# Patient Record
Sex: Female | Born: 1940 | Race: White | Hispanic: No | Smoking: Never smoker
Health system: Southern US, Community
[De-identification: ages and names within clinical notes are randomized; demographics above are authoritative.]

## PROBLEM LIST (undated history)

## (undated) ENCOUNTER — Emergency Department (HOSPITAL_COMMUNITY): Admission: EM | Payer: Medicare HMO | Source: Home / Self Care

## (undated) DIAGNOSIS — R32 Unspecified urinary incontinence: Secondary | ICD-10-CM

## (undated) DIAGNOSIS — I480 Paroxysmal atrial fibrillation: Secondary | ICD-10-CM

## (undated) DIAGNOSIS — E7800 Pure hypercholesterolemia, unspecified: Secondary | ICD-10-CM

## (undated) DIAGNOSIS — M1990 Unspecified osteoarthritis, unspecified site: Secondary | ICD-10-CM

## (undated) DIAGNOSIS — F32A Depression, unspecified: Secondary | ICD-10-CM

## (undated) DIAGNOSIS — F0390 Unspecified dementia without behavioral disturbance: Secondary | ICD-10-CM

## (undated) DIAGNOSIS — F329 Major depressive disorder, single episode, unspecified: Secondary | ICD-10-CM

## (undated) DIAGNOSIS — I10 Essential (primary) hypertension: Secondary | ICD-10-CM

## (undated) DIAGNOSIS — K219 Gastro-esophageal reflux disease without esophagitis: Secondary | ICD-10-CM

## (undated) HISTORY — DX: Essential (primary) hypertension: I10

## (undated) HISTORY — DX: Unspecified osteoarthritis, unspecified site: M19.90

## (undated) HISTORY — PX: DILATION AND CURETTAGE OF UTERUS: SHX78

## (undated) HISTORY — DX: Paroxysmal atrial fibrillation: I48.0

## (undated) HISTORY — PX: VAGINAL HYSTERECTOMY: SUR661

## (undated) HISTORY — DX: Depression, unspecified: F32.A

## (undated) HISTORY — PX: APPENDECTOMY: SHX54

## (undated) HISTORY — DX: Major depressive disorder, single episode, unspecified: F32.9

---

## 1999-12-21 ENCOUNTER — Emergency Department (HOSPITAL_COMMUNITY): Admission: EM | Admit: 1999-12-21 | Discharge: 1999-12-21 | Payer: Self-pay | Admitting: Emergency Medicine

## 2001-08-04 ENCOUNTER — Emergency Department (HOSPITAL_COMMUNITY): Admission: EM | Admit: 2001-08-04 | Discharge: 2001-08-04 | Payer: Self-pay | Admitting: Emergency Medicine

## 2003-04-11 ENCOUNTER — Encounter: Admission: RE | Admit: 2003-04-11 | Discharge: 2003-04-11 | Payer: Self-pay | Admitting: Internal Medicine

## 2003-04-18 ENCOUNTER — Encounter: Admission: RE | Admit: 2003-04-18 | Discharge: 2003-04-18 | Payer: Self-pay | Admitting: Internal Medicine

## 2003-04-25 ENCOUNTER — Encounter: Admission: RE | Admit: 2003-04-25 | Discharge: 2003-04-25 | Payer: Self-pay | Admitting: Internal Medicine

## 2003-05-06 ENCOUNTER — Ambulatory Visit (HOSPITAL_COMMUNITY): Admission: RE | Admit: 2003-05-06 | Discharge: 2003-05-06 | Payer: Self-pay

## 2003-05-09 ENCOUNTER — Encounter: Admission: RE | Admit: 2003-05-09 | Discharge: 2003-05-09 | Payer: Self-pay | Admitting: Internal Medicine

## 2003-05-23 ENCOUNTER — Encounter: Admission: RE | Admit: 2003-05-23 | Discharge: 2003-05-23 | Payer: Self-pay | Admitting: Internal Medicine

## 2003-05-29 ENCOUNTER — Encounter: Admission: RE | Admit: 2003-05-29 | Discharge: 2003-05-29 | Payer: Self-pay | Admitting: Internal Medicine

## 2003-07-04 ENCOUNTER — Encounter: Admission: RE | Admit: 2003-07-04 | Discharge: 2003-07-04 | Payer: Self-pay | Admitting: Internal Medicine

## 2003-07-04 ENCOUNTER — Ambulatory Visit (HOSPITAL_COMMUNITY): Admission: RE | Admit: 2003-07-04 | Discharge: 2003-07-04 | Payer: Self-pay | Admitting: Internal Medicine

## 2003-08-07 ENCOUNTER — Encounter: Payer: Self-pay | Admitting: Cardiology

## 2003-08-07 ENCOUNTER — Ambulatory Visit (HOSPITAL_COMMUNITY): Admission: RE | Admit: 2003-08-07 | Discharge: 2003-08-07 | Payer: Self-pay | Admitting: Internal Medicine

## 2003-09-26 ENCOUNTER — Emergency Department (HOSPITAL_COMMUNITY): Admission: EM | Admit: 2003-09-26 | Discharge: 2003-09-27 | Payer: Self-pay | Admitting: *Deleted

## 2003-10-03 ENCOUNTER — Ambulatory Visit (HOSPITAL_COMMUNITY): Admission: RE | Admit: 2003-10-03 | Discharge: 2003-10-03 | Payer: Self-pay | Admitting: Internal Medicine

## 2003-10-03 ENCOUNTER — Encounter: Admission: RE | Admit: 2003-10-03 | Discharge: 2003-10-03 | Payer: Self-pay | Admitting: Internal Medicine

## 2003-10-10 ENCOUNTER — Encounter: Admission: RE | Admit: 2003-10-10 | Discharge: 2003-10-10 | Payer: Self-pay | Admitting: Internal Medicine

## 2003-12-31 ENCOUNTER — Encounter: Admission: RE | Admit: 2003-12-31 | Discharge: 2003-12-31 | Payer: Self-pay | Admitting: Internal Medicine

## 2004-07-22 ENCOUNTER — Ambulatory Visit: Payer: Self-pay | Admitting: Internal Medicine

## 2004-08-07 ENCOUNTER — Ambulatory Visit: Payer: Self-pay | Admitting: Internal Medicine

## 2005-02-15 ENCOUNTER — Ambulatory Visit: Payer: Self-pay | Admitting: Internal Medicine

## 2005-02-19 ENCOUNTER — Ambulatory Visit: Payer: Self-pay | Admitting: Internal Medicine

## 2005-03-02 ENCOUNTER — Ambulatory Visit: Payer: Self-pay | Admitting: Internal Medicine

## 2005-03-15 ENCOUNTER — Encounter: Admission: RE | Admit: 2005-03-15 | Discharge: 2005-03-15 | Payer: Self-pay | Admitting: Internal Medicine

## 2005-03-29 ENCOUNTER — Ambulatory Visit: Payer: Self-pay | Admitting: Internal Medicine

## 2005-03-31 ENCOUNTER — Ambulatory Visit: Payer: Self-pay | Admitting: Internal Medicine

## 2005-06-28 ENCOUNTER — Ambulatory Visit: Payer: Self-pay | Admitting: Internal Medicine

## 2005-12-23 ENCOUNTER — Ambulatory Visit: Payer: Self-pay | Admitting: Hospitalist

## 2005-12-23 ENCOUNTER — Ambulatory Visit (HOSPITAL_COMMUNITY): Admission: RE | Admit: 2005-12-23 | Discharge: 2005-12-23 | Payer: Self-pay | Admitting: Internal Medicine

## 2005-12-28 ENCOUNTER — Encounter (INDEPENDENT_AMBULATORY_CARE_PROVIDER_SITE_OTHER): Payer: Self-pay | Admitting: Cardiology

## 2005-12-28 ENCOUNTER — Ambulatory Visit (HOSPITAL_COMMUNITY): Admission: RE | Admit: 2005-12-28 | Discharge: 2005-12-28 | Payer: Self-pay | Admitting: Infectious Diseases

## 2006-02-23 ENCOUNTER — Ambulatory Visit: Payer: Self-pay | Admitting: Internal Medicine

## 2006-03-04 ENCOUNTER — Ambulatory Visit: Payer: Self-pay | Admitting: Internal Medicine

## 2006-03-04 ENCOUNTER — Encounter (INDEPENDENT_AMBULATORY_CARE_PROVIDER_SITE_OTHER): Payer: Self-pay | Admitting: Infectious Diseases

## 2006-03-04 LAB — CONVERTED CEMR LAB
BUN: 15 mg/dL (ref 6–23)
CO2: 25 meq/L (ref 19–32)
Calcium: 9.4 mg/dL (ref 8.4–10.5)
Chloride: 101 meq/L (ref 96–112)
Cholesterol: 109 mg/dL (ref 0–200)
Creatinine, Ser: 0.98 mg/dL (ref 0.40–1.20)
Glucose, Bld: 100 mg/dL — ABNORMAL HIGH (ref 70–99)
HDL: 44 mg/dL (ref >39)
LDL Cholesterol: 45 mg/dL (ref 0–99)
Potassium: 4.1 meq/L (ref 3.5–5.3)
Sodium: 141 meq/L (ref 135–145)
Total CHOL/HDL Ratio: 2.5
Triglycerides: 101 mg/dL (ref <150)
VLDL: 20 mg/dL (ref 0–40)

## 2006-04-12 ENCOUNTER — Encounter: Admission: RE | Admit: 2006-04-12 | Discharge: 2006-04-12 | Payer: Self-pay | Admitting: Internal Medicine

## 2006-07-13 ENCOUNTER — Ambulatory Visit: Payer: Self-pay | Admitting: Internal Medicine

## 2006-07-13 DIAGNOSIS — F329 Major depressive disorder, single episode, unspecified: Secondary | ICD-10-CM | POA: Insufficient documentation

## 2006-07-13 DIAGNOSIS — M25569 Pain in unspecified knee: Secondary | ICD-10-CM | POA: Insufficient documentation

## 2006-07-13 DIAGNOSIS — M545 Low back pain: Secondary | ICD-10-CM

## 2006-07-13 DIAGNOSIS — M1990 Unspecified osteoarthritis, unspecified site: Secondary | ICD-10-CM | POA: Insufficient documentation

## 2006-07-13 DIAGNOSIS — I10 Essential (primary) hypertension: Secondary | ICD-10-CM

## 2006-07-13 DIAGNOSIS — M810 Age-related osteoporosis without current pathological fracture: Secondary | ICD-10-CM

## 2006-08-04 ENCOUNTER — Telehealth: Payer: Self-pay | Admitting: *Deleted

## 2006-10-13 ENCOUNTER — Telehealth: Payer: Self-pay | Admitting: *Deleted

## 2006-10-25 ENCOUNTER — Telehealth: Payer: Self-pay | Admitting: *Deleted

## 2006-10-26 ENCOUNTER — Ambulatory Visit: Payer: Self-pay | Admitting: Internal Medicine

## 2006-10-26 DIAGNOSIS — L989 Disorder of the skin and subcutaneous tissue, unspecified: Secondary | ICD-10-CM | POA: Insufficient documentation

## 2006-11-14 ENCOUNTER — Telehealth (INDEPENDENT_AMBULATORY_CARE_PROVIDER_SITE_OTHER): Payer: Self-pay | Admitting: *Deleted

## 2006-11-23 ENCOUNTER — Telehealth (INDEPENDENT_AMBULATORY_CARE_PROVIDER_SITE_OTHER): Payer: Self-pay | Admitting: *Deleted

## 2007-01-05 ENCOUNTER — Inpatient Hospital Stay (HOSPITAL_COMMUNITY): Admission: EM | Admit: 2007-01-05 | Discharge: 2007-01-07 | Payer: Self-pay | Admitting: Emergency Medicine

## 2007-01-19 ENCOUNTER — Encounter (INDEPENDENT_AMBULATORY_CARE_PROVIDER_SITE_OTHER): Payer: Self-pay | Admitting: Infectious Diseases

## 2007-01-19 ENCOUNTER — Ambulatory Visit: Payer: Self-pay | Admitting: Hospitalist

## 2007-01-19 DIAGNOSIS — R7301 Impaired fasting glucose: Secondary | ICD-10-CM | POA: Insufficient documentation

## 2007-01-19 LAB — CONVERTED CEMR LAB
ALT: 13 units/L (ref 0–35)
AST: 18 units/L (ref 0–37)
Albumin: 4.1 g/dL (ref 3.5–5.2)
Alkaline Phosphatase: 92 units/L (ref 39–117)
BUN: 19 mg/dL (ref 6–23)
CO2: 24 meq/L (ref 19–32)
Calcium: 9.6 mg/dL (ref 8.4–10.5)
Chloride: 106 meq/L (ref 96–112)
Cholesterol: 127 mg/dL (ref 0–200)
Creatinine, Ser: 0.96 mg/dL (ref 0.40–1.20)
Glucose, Bld: 91 mg/dL (ref 70–99)
HDL: 43 mg/dL (ref >39)
Hgb A1c MFr Bld: 5.7 %
LDL Cholesterol: 60 mg/dL (ref 0–99)
Potassium: 4.8 meq/L (ref 3.5–5.3)
Sodium: 141 meq/L (ref 135–145)
Total Bilirubin: 0.7 mg/dL (ref 0.3–1.2)
Total CHOL/HDL Ratio: 3
Total Protein: 7.3 g/dL (ref 6.0–8.3)
Triglycerides: 119 mg/dL (ref <150)
VLDL: 24 mg/dL (ref 0–40)

## 2007-01-20 DIAGNOSIS — E669 Obesity, unspecified: Secondary | ICD-10-CM

## 2007-07-20 ENCOUNTER — Telehealth (INDEPENDENT_AMBULATORY_CARE_PROVIDER_SITE_OTHER): Payer: Self-pay | Admitting: Infectious Diseases

## 2007-08-22 ENCOUNTER — Telehealth (INDEPENDENT_AMBULATORY_CARE_PROVIDER_SITE_OTHER): Payer: Self-pay | Admitting: Infectious Diseases

## 2007-09-01 ENCOUNTER — Encounter (INDEPENDENT_AMBULATORY_CARE_PROVIDER_SITE_OTHER): Payer: Self-pay | Admitting: Infectious Diseases

## 2007-09-01 ENCOUNTER — Ambulatory Visit: Payer: Self-pay | Admitting: Hospitalist

## 2007-09-01 DIAGNOSIS — F418 Other specified anxiety disorders: Secondary | ICD-10-CM

## 2007-09-01 LAB — CONVERTED CEMR LAB
ALT: 13 units/L (ref 0–35)
AST: 18 units/L (ref 0–37)
Albumin: 4 g/dL (ref 3.5–5.2)
Alkaline Phosphatase: 93 units/L (ref 39–117)
BUN: 21 mg/dL (ref 6–23)
CO2: 25 meq/L (ref 19–32)
Calcium: 9.7 mg/dL (ref 8.4–10.5)
Chloride: 104 meq/L (ref 96–112)
Creatinine, Ser: 0.93 mg/dL (ref 0.40–1.20)
Glucose, Bld: 99 mg/dL (ref 70–99)
Potassium: 4.9 meq/L (ref 3.5–5.3)
Sodium: 142 meq/L (ref 135–145)
Total Bilirubin: 0.7 mg/dL (ref 0.3–1.2)
Total Protein: 7 g/dL (ref 6.0–8.3)

## 2007-12-07 ENCOUNTER — Emergency Department (HOSPITAL_COMMUNITY): Admission: EM | Admit: 2007-12-07 | Discharge: 2007-12-07 | Payer: Self-pay | Admitting: Emergency Medicine

## 2008-02-28 ENCOUNTER — Telehealth (INDEPENDENT_AMBULATORY_CARE_PROVIDER_SITE_OTHER): Payer: Self-pay | Admitting: *Deleted

## 2008-03-18 ENCOUNTER — Telehealth: Payer: Self-pay | Admitting: *Deleted

## 2008-04-04 ENCOUNTER — Ambulatory Visit: Payer: Self-pay | Admitting: Internal Medicine

## 2008-04-04 ENCOUNTER — Encounter (INDEPENDENT_AMBULATORY_CARE_PROVIDER_SITE_OTHER): Payer: Self-pay | Admitting: *Deleted

## 2008-04-15 ENCOUNTER — Encounter (INDEPENDENT_AMBULATORY_CARE_PROVIDER_SITE_OTHER): Payer: Self-pay | Admitting: Internal Medicine

## 2008-04-15 ENCOUNTER — Ambulatory Visit: Payer: Self-pay | Admitting: Internal Medicine

## 2008-04-15 DIAGNOSIS — K219 Gastro-esophageal reflux disease without esophagitis: Secondary | ICD-10-CM

## 2008-04-15 LAB — CONVERTED CEMR LAB
BUN: 15 mg/dL (ref 6–23)
CO2: 29 meq/L (ref 19–32)
Calcium: 9.9 mg/dL (ref 8.4–10.5)
Chloride: 100 meq/L (ref 96–112)
Creatinine, Ser: 0.96 mg/dL (ref 0.40–1.20)
Glucose, Bld: 103 mg/dL — ABNORMAL HIGH (ref 70–99)
Potassium: 4.7 meq/L (ref 3.5–5.3)
Sodium: 137 meq/L (ref 135–145)

## 2008-04-18 LAB — CONVERTED CEMR LAB
BUN: 13 mg/dL (ref 6–23)
Basophils Absolute: 0.1 10*3/uL (ref 0.0–0.1)
Basophils Relative: 1 % (ref 0–1)
CO2: 24 meq/L (ref 19–32)
Calcium: 10 mg/dL (ref 8.4–10.5)
Chloride: 104 meq/L (ref 96–112)
Cholesterol: 138 mg/dL (ref 0–200)
Creatinine, Ser: 1.08 mg/dL (ref 0.40–1.20)
Eosinophils Absolute: 0.2 10*3/uL (ref 0.0–0.7)
Eosinophils Relative: 2 % (ref 0–5)
Glucose, Bld: 92 mg/dL (ref 70–99)
HCT: 44.9 % (ref 36.0–46.0)
HDL: 48 mg/dL (ref >39)
Hemoglobin: 14.4 g/dL (ref 12.0–15.0)
LDL Cholesterol: 67 mg/dL (ref 0–99)
Lymphocytes Relative: 42 % (ref 12–46)
Lymphs Abs: 4.6 10*3/uL — ABNORMAL HIGH (ref 0.7–4.0)
MCHC: 32.1 g/dL (ref 30.0–36.0)
MCV: 92 fL (ref 78.0–100.0)
Monocytes Absolute: 1.3 10*3/uL — ABNORMAL HIGH (ref 0.1–1.0)
Monocytes Relative: 12 % (ref 3–12)
Neutro Abs: 4.9 10*3/uL (ref 1.7–7.7)
Neutrophils Relative %: 44 % (ref 43–77)
Platelets: 255 10*3/uL (ref 150–400)
Potassium: 5.6 meq/L — ABNORMAL HIGH (ref 3.5–5.3)
RBC: 4.88 M/uL (ref 3.87–5.11)
RDW: 14.2 % (ref 11.5–15.5)
Sodium: 140 meq/L (ref 135–145)
Total CHOL/HDL Ratio: 2.9
Triglycerides: 116 mg/dL (ref <150)
VLDL: 23 mg/dL (ref 0–40)
WBC: 11 10*3/uL — ABNORMAL HIGH (ref 4.0–10.5)

## 2008-04-24 ENCOUNTER — Telehealth (INDEPENDENT_AMBULATORY_CARE_PROVIDER_SITE_OTHER): Payer: Self-pay | Admitting: Internal Medicine

## 2008-06-05 ENCOUNTER — Telehealth (INDEPENDENT_AMBULATORY_CARE_PROVIDER_SITE_OTHER): Payer: Self-pay | Admitting: *Deleted

## 2008-06-12 ENCOUNTER — Encounter (INDEPENDENT_AMBULATORY_CARE_PROVIDER_SITE_OTHER): Payer: Self-pay | Admitting: *Deleted

## 2008-06-12 ENCOUNTER — Ambulatory Visit: Payer: Self-pay | Admitting: Internal Medicine

## 2008-06-13 ENCOUNTER — Encounter (INDEPENDENT_AMBULATORY_CARE_PROVIDER_SITE_OTHER): Payer: Self-pay | Admitting: Internal Medicine

## 2008-06-13 LAB — CONVERTED CEMR LAB
BUN: 12 mg/dL (ref 6–23)
CO2: 21 meq/L (ref 19–32)
Calcium: 10.1 mg/dL (ref 8.4–10.5)
Creatinine, Ser: 1.02 mg/dL (ref 0.40–1.20)
Glucose, Bld: 121 mg/dL — ABNORMAL HIGH (ref 70–99)
Potassium: 5.1 meq/L (ref 3.5–5.3)

## 2008-10-01 ENCOUNTER — Ambulatory Visit: Payer: Self-pay | Admitting: Internal Medicine

## 2008-10-01 DIAGNOSIS — L259 Unspecified contact dermatitis, unspecified cause: Secondary | ICD-10-CM

## 2008-12-12 ENCOUNTER — Telehealth: Payer: Self-pay | Admitting: *Deleted

## 2008-12-30 ENCOUNTER — Telehealth (INDEPENDENT_AMBULATORY_CARE_PROVIDER_SITE_OTHER): Payer: Self-pay | Admitting: Internal Medicine

## 2009-01-29 ENCOUNTER — Encounter: Payer: Self-pay | Admitting: Internal Medicine

## 2009-01-29 ENCOUNTER — Ambulatory Visit (HOSPITAL_COMMUNITY): Admission: RE | Admit: 2009-01-29 | Discharge: 2009-01-29 | Payer: Self-pay | Admitting: Internal Medicine

## 2009-01-29 ENCOUNTER — Ambulatory Visit: Payer: Self-pay | Admitting: Infectious Diseases

## 2009-01-29 DIAGNOSIS — E785 Hyperlipidemia, unspecified: Secondary | ICD-10-CM | POA: Insufficient documentation

## 2009-01-30 LAB — CONVERTED CEMR LAB
ALT: 15 units/L (ref 0–35)
AST: 19 units/L (ref 0–37)
Albumin: 4.2 g/dL (ref 3.5–5.2)
Alkaline Phosphatase: 84 units/L (ref 39–117)
BUN: 19 mg/dL (ref 6–23)
CO2: 22 meq/L (ref 19–32)
Calcium: 9.8 mg/dL (ref 8.4–10.5)
Chloride: 104 meq/L (ref 96–112)
Cholesterol: 180 mg/dL (ref 0–200)
Creatinine, Ser: 1 mg/dL (ref 0.40–1.20)
Glucose, Bld: 88 mg/dL (ref 70–99)
HDL: 46 mg/dL (ref >39)
LDL Cholesterol: 107 mg/dL — ABNORMAL HIGH (ref 0–99)
Potassium: 5.1 meq/L (ref 3.5–5.3)
Sodium: 139 meq/L (ref 135–145)
TSH: 5.019 microintl units/mL — ABNORMAL HIGH (ref 0.350–4.5)
Total Bilirubin: 0.7 mg/dL (ref 0.3–1.2)
Total CHOL/HDL Ratio: 3.9
Total Protein: 7.1 g/dL (ref 6.0–8.3)
Triglycerides: 134 mg/dL (ref <150)
VLDL: 27 mg/dL (ref 0–40)

## 2009-02-10 ENCOUNTER — Ambulatory Visit (HOSPITAL_COMMUNITY): Admission: RE | Admit: 2009-02-10 | Discharge: 2009-02-10 | Payer: Self-pay | Admitting: Internal Medicine

## 2009-02-11 ENCOUNTER — Encounter: Payer: Self-pay | Admitting: Internal Medicine

## 2009-02-25 ENCOUNTER — Ambulatory Visit: Payer: Self-pay | Admitting: Internal Medicine

## 2009-02-25 DIAGNOSIS — E039 Hypothyroidism, unspecified: Secondary | ICD-10-CM | POA: Insufficient documentation

## 2009-02-25 LAB — CONVERTED CEMR LAB
Free T4: 1.01 ng/dL (ref 0.80–1.80)
T3, Free: 2.9 pg/mL (ref 2.3–4.2)
TSH: 4.56 microintl units/mL — ABNORMAL HIGH (ref 0.350–4.5)
Vitamin B-12: 447 pg/mL (ref 211–911)

## 2009-03-11 ENCOUNTER — Encounter: Payer: Self-pay | Admitting: Internal Medicine

## 2009-03-12 ENCOUNTER — Encounter: Payer: Self-pay | Admitting: Internal Medicine

## 2009-05-23 ENCOUNTER — Ambulatory Visit: Payer: Self-pay | Admitting: Internal Medicine

## 2009-05-23 LAB — CONVERTED CEMR LAB
Alkaline Phosphatase: 77 units/L (ref 39–117)
Creatinine, Ser: 0.86 mg/dL (ref 0.40–1.20)
Glucose, Bld: 92 mg/dL (ref 70–99)
HCT: 44.7 % (ref 36.0–46.0)
HDL: 49 mg/dL (ref >39)
LDL Cholesterol: 90 mg/dL (ref 0–99)
MCHC: 31.3 g/dL (ref 30.0–36.0)
MCV: 92.5 fL (ref >=78.0)
RBC: 4.83 M/uL (ref 3.87–5.11)
Sodium: 138 meq/L (ref 135–145)
Total Bilirubin: 0.8 mg/dL (ref 0.3–1.2)
Total CHOL/HDL Ratio: 3.2
Total Protein: 7.4 g/dL (ref 6.0–8.3)
Triglycerides: 83 mg/dL (ref <150)
VLDL: 17 mg/dL (ref 0–40)

## 2009-06-26 ENCOUNTER — Encounter: Payer: Self-pay | Admitting: Internal Medicine

## 2009-07-08 ENCOUNTER — Telehealth: Payer: Self-pay | Admitting: Internal Medicine

## 2009-07-15 ENCOUNTER — Ambulatory Visit: Payer: Self-pay | Admitting: Internal Medicine

## 2009-09-11 ENCOUNTER — Ambulatory Visit: Payer: Self-pay | Admitting: Internal Medicine

## 2010-01-02 ENCOUNTER — Ambulatory Visit: Payer: Self-pay | Admitting: Internal Medicine

## 2010-01-02 LAB — CONVERTED CEMR LAB
Albumin: 4 g/dL (ref 3.5–5.2)
BUN: 15 mg/dL (ref 6–23)
CO2: 26 meq/L (ref 19–32)
Calcium: 9.3 mg/dL (ref 8.4–10.5)
Chloride: 101 meq/L (ref 96–112)
Cholesterol: 116 mg/dL (ref 0–200)
Creatinine, Ser: 0.91 mg/dL (ref 0.40–1.20)
Glucose, Bld: 85 mg/dL (ref 70–99)
HDL: 51 mg/dL (ref >39)
LDL Goal: 130 mg/dL
Potassium: 4.6 meq/L (ref 3.5–5.3)
Total CHOL/HDL Ratio: 2.3
Triglycerides: 64 mg/dL (ref <150)

## 2010-03-23 ENCOUNTER — Telehealth: Payer: Self-pay | Admitting: *Deleted

## 2010-03-23 ENCOUNTER — Encounter: Payer: Self-pay | Admitting: Internal Medicine

## 2010-03-24 ENCOUNTER — Encounter: Payer: Self-pay | Admitting: Internal Medicine

## 2010-03-24 ENCOUNTER — Ambulatory Visit: Payer: Self-pay | Admitting: Cardiology

## 2010-03-26 ENCOUNTER — Encounter: Payer: Self-pay | Admitting: Internal Medicine

## 2010-03-26 DIAGNOSIS — J168 Pneumonia due to other specified infectious organisms: Secondary | ICD-10-CM

## 2010-04-03 ENCOUNTER — Ambulatory Visit: Payer: Self-pay | Admitting: Internal Medicine

## 2010-04-06 ENCOUNTER — Telehealth: Payer: Self-pay | Admitting: Internal Medicine

## 2010-04-09 ENCOUNTER — Encounter: Payer: Self-pay | Admitting: Internal Medicine

## 2010-04-09 ENCOUNTER — Inpatient Hospital Stay (HOSPITAL_COMMUNITY): Admission: EM | Admit: 2010-04-09 | Discharge: 2010-03-27 | Payer: Self-pay | Admitting: Emergency Medicine

## 2010-04-11 ENCOUNTER — Encounter: Payer: Self-pay | Admitting: Internal Medicine

## 2010-04-15 ENCOUNTER — Telehealth: Payer: Self-pay | Admitting: Internal Medicine

## 2010-05-05 ENCOUNTER — Encounter: Payer: Self-pay | Admitting: Internal Medicine

## 2010-05-07 ENCOUNTER — Encounter: Payer: Self-pay | Admitting: Internal Medicine

## 2010-05-07 ENCOUNTER — Encounter: Payer: Self-pay | Admitting: Infectious Diseases

## 2010-06-02 NOTE — Progress Notes (Signed)
Summary: phone/gg  Phone Note Call from Patient   Caller: Patient Summary of Call: Pt called with c/o diarrhea, sore all over, headache, fever on and off, productive cough and SOB. Onset 7 days ago.  She is taking IBU with some relief.  Offered appointment at 4:00 but she can't get a ride at that time. She will go to [facility] this AM Initial call taken by: Merrie Roof RN,  March 23, 2010 10:38 AM

## 2010-06-02 NOTE — Assessment & Plan Note (Signed)
Summary: KNEE/ (MILLS)SB.   Vital Signs:  Patient profile:   70 year old female Height:      59 inches (149.86 cm) Weight:      256.03 pounds (116.38 kg) BMI:     51.90 O2 Sat:      99 % on Room air Temp:     98.4 degrees F (36.89 degrees C) oral Pulse rate:   96 / minute BP sitting:   156 / 93  (left arm) Cuff size:   small  Vitals Entered By: Angelina Ok RN (May 23, 2009 10:08 AM)  O2 Flow:  Room air CC: Depression Is Patient Diabetic? No Pain Assessment Patient in pain? yes     Location: legs Intensity: 8 Type: aching Onset of pain  Constant Nutritional Status BMI of > 30 = obese  Have you ever been in a relationship where you felt threatened, hurt or afraid?No   Does patient need assistance? Functional Status Self care Ambulation Impaired:Risk for fall, Wheelchair Comments Needs refillon Paxil has been out about 5 days.  Needs refills on other meds.  Has a friend who is dying.  Teary .  Using a wheel chair today at visit.  Usually uses a walker.     Primary Care Provider:  Ronda Fairly MD  CC:  Depression.  History of Present Illness: 70 yr old woman with pmhx as described below comes to the clinic for follow up. Patient would like to know why see was started on Levothyroxine. Reports to be feeling depressed as her friend is dying from metastatic breast cancer. She ran out of Paxil 5 days ago. Patient denies HI/SI.   Patient states that recently her leg has not been bothering her unless she is on her feet a long time.   Patient is being seen by [employer]. Reports that specialist told her she will need knee surgery, but does not know when they are planning to do it.   Depression History:      The patient is having a depressed mood most of the day but denies diminished interest in her usual daily activities.        The patient denies that she feels like life is not worth living, denies that she wishes that she were dead, and denies that she has  thought about ending her life.        Comments:  Has been off her Paxil x 5 days.  Has a close friend who is dying.  Tearful.   Problems Prior to Update: 1)  Hypothyroidism  (ICD-244.9) 2)  Hyperlipidemia  (ICD-272.4) 3)  Contact Dermatitis&other Eczema Due Unspec Cause  (ICD-692.9) 4)  Accidental Fall  (ICD-E888.9) 5)  Gerd  (ICD-530.81) 6)  Anxiety Disorder Conditions Classified Elsewhere  (ICD-293.84) 7)  Health Maintenance Exam  (ICD-V70.0) 8)  Obesity  (ICD-278.00) 9)  Impaired Fasting Glucose  (ICD-790.21) 10)  Skin Lesion  (ICD-709.9) 11)  Family History Depression  (ICD-V17.0) 12)  Osteoporosis  (ICD-733.00) 13)  Osteoarthritis  (ICD-715.90) 14)  Low Back Pain  (ICD-724.2) 15)  Hypertension  (ICD-401.9) 16)  Depression  (ICD-311) 17)  Knee Pain, Bilateral  (ICD-719.46)  Medications Prior to Update: 1)  Paxil 40 Mg Tabs (Paroxetine Hcl) .... Take 1 Tablet By Mouth Once A Day 2)  Ultram 50 Mg Tabs (Tramadol Hcl) .... Take 1 Tablet By Mouth Two Times A Day As Needed 3)  Simvastatin 10 Mg Tabs (Simvastatin) .... Take 1 Tablet By Mouth Once A Day 4)  Calcarb 600/d 600-125 Mg-Unit Tabs (Calcium-Vitamin D) .... Take 1 Tablet By Mouth Three Times A Day 5)  Prilosec 20 Mg Cpdr (Omeprazole) .... Take 2 Tablet By Mouth One Time A Day 6)  Triamcinolone Acetonide 0.1 % Crea (Triamcinolone Acetonide) .... Apply To Irritated Skin Three Times Daily.  Please Do Not Put On Open Cuts or Your Face. 7)  Lisinopril 5 Mg Tabs (Lisinopril) .... Take One Tab By Mouth Once Daily 8)  Synthroid 25 Mcg Tabs (Levothyroxine Sodium) .... Take 1 Tablet By Mouth Daily.  Current Medications (verified): 1)  Paxil 40 Mg Tabs (Paroxetine Hcl) .... Take 1 Tablet By Mouth Once A Day 2)  Ultram 50 Mg Tabs (Tramadol Hcl) .... Take 1 Tablet By Mouth Two Times A Day As Needed 3)  Simvastatin 10 Mg Tabs (Simvastatin) .... Take 1 Tablet By Mouth Once A Day 4)  Calcarb 600/d 600-125 Mg-Unit Tabs (Calcium-Vitamin D)  .... Take 1 Tablet By Mouth Three Times A Day 5)  Prilosec 20 Mg Cpdr (Omeprazole) .... Take 2 Tablet By Mouth One Time A Day 6)  Triamcinolone Acetonide 0.1 % Crea (Triamcinolone Acetonide) .... Apply To Irritated Skin Three Times Daily.  Please Do Not Put On Open Cuts or Your Face. 7)  Lisinopril 5 Mg Tabs (Lisinopril) .... Take One Tab By Mouth Once Daily 8)  Synthroid 25 Mcg Tabs (Levothyroxine Sodium) .... Take 1 Tablet By Mouth Daily.  Allergies: No Known Drug Allergies  Past History:  Past Medical History: Last updated: 07/13/2006 Bilateral knee pain Depression Hypertension Low back pain Osteoarthritis Osteoporosis  Family History: Last updated: 04/15/2008 Family History Depression  Social History: Last updated: 04/15/2008 Retired: motel work  Divorced Never Smoked Alcohol use-no Drug use-no Regular exercise-no  Risk Factors: Exercise: no (02/25/2009)  Risk Factors: Smoking Status: never (02/25/2009)  Family History: Reviewed history from 04/15/2008 and no changes required. Family History Depression  Social History: Reviewed history from 04/15/2008 and no changes required. Retired: motel work  Divorced Never Smoked Alcohol use-no Drug use-no Regular exercise-no  Review of Systems       The patient complains of difficulty walking.  The patient denies fever, chest pain, dyspnea on exertion, prolonged cough, headaches, hemoptysis, abdominal pain, melena, hematochezia, and hematuria.    Physical Exam  General:  alert.  teary Mouth:  MMM Neck:  supple.   Lungs:  normal breath sounds, no crackles, and no wheezes.   Heart:  normal rate, regular rhythm, no murmur, and no gallop.   Abdomen:  obese, soft and non-tender.  normal bowel sounds.   Msk:  Tenderness on the left knee. no joint warmth, no redness over joints, no joint deformities, and no joint instability.   Extremities:  trace left pedal edema and +1 right pedal edema.   Neurologic:  alert &  oriented X3.  Skin:  skin pigmentation changes on lower extremities erythema on digits and dorsum of hands bilat, erythema spares the palms.  There is also small breaks/cracks in the skin at joint surfaces.  No active bleeding or drainage. Psych:  depressed affect.     Impression & Recommendations:  Problem # 1:  HYPERTENSION (ICD-401.9) Uncontrolled. No episodes of falling. Will slightly increase medication  and follow up. Check bmet in one month.  Her updated medication list for this problem includes:    Lisinopril 10 Mg Tabs (Lisinopril) .Marland Kitchen... Take 1 tablet by mouth once a day  BP today: 156/93 Prior BP: 153/90 (02/25/2009)  Labs Reviewed: K+: 5.1 (01/29/2009) Creat: :  1.00 (01/29/2009)   Chol: 180 (01/29/2009)   HDL: 46 (01/29/2009)   LDL: 107 (01/29/2009)   TG: 134 (01/29/2009)  Problem # 2:  DEPRESSION (ICD-311) Depression not controlled on current regimen. No SSI/HI. Will dc paxil. Start patient on celexa. On follow up will consider increasing celexa if needed. Can also combine with wellbutrin if needed.  Her updated medication list for this problem includes:    Celexa 20 Mg Tabs (Citalopram hydrobromide) .Marland Kitchen... Take 1 tablet by mouth once a day  Problem # 3:  HYPOTHYROIDISM (ICD-244.9) Recheck TSH and reasses.  Her updated medication list for this problem includes:    Synthroid 25 Mcg Tabs (Levothyroxine sodium) .Marland Kitchen... Take 1 tablet by mouth daily.  Orders: T-TSH [id number])  Labs Reviewed: TSH: 4.560 (02/25/2009)    HgBA1c: 5.7 (01/19/2007) Chol: 180 (01/29/2009)   HDL: 46 (01/29/2009)   LDL: 107 (01/29/2009)   TG: 134 (01/29/2009)  Problem # 4:  HYPERLIPIDEMIA (ICD-272.4) Recheck lab results and reasses.  Her updated medication list for this problem includes:    Simvastatin 10 Mg Tabs (Simvastatin) .Marland Kitchen... Take 1 tablet by mouth once a day  Orders: T-Comprehensive Metabolic Panel [id number]) T-Lipid Profile [id number]) T-CBC No Diff ([id number])  Labs  Reviewed: SGOT: 19 (01/29/2009)   SGPT: 15 (01/29/2009)   HDL:46 (01/29/2009), 48 (04/04/2008)  LDL:107 (01/29/2009), 67 (44/05/270)  Chol:180 (01/29/2009), 138 (04/04/2008)  Trig:134 (01/29/2009), 116 (04/04/2008)  Problem # 5:  CONTACT DERMATITIS&OTHER ECZEMA DUE UNSPEC CAUSE (ICD-692.9) Continue current regimen.  Her updated medication list for this problem includes:    Triamcinolone Acetonide 0.1 % Crea (Triamcinolone acetonide) .Marland Kitchen... Apply to irritated skin three times daily.  please do not put on open cuts or your face.  Problem # 6:  KNEE PAIN, BILATERAL (ICD-719.46) Stable. Will get patient a 4 prong cane.  Her updated medication list for this problem includes:    Ultram 50 Mg Tabs (Tramadol hcl) .Marland Kitchen... Take 1 tablet by mouth two times a day as needed  Complete Medication List: 1)  Celexa 20 Mg Tabs (Citalopram hydrobromide) .... Take 1 tablet by mouth once a day 2)  Ultram 50 Mg Tabs (Tramadol hcl) .... Take 1 tablet by mouth two times a day as needed 3)  Simvastatin 10 Mg Tabs (Simvastatin) .... Take 1 tablet by mouth once a day 4)  Calcarb 600/d 600-125 Mg-unit Tabs (Calcium-vitamin d) .... Take 1 tablet by mouth three times a day 5)  Prilosec 20 Mg Cpdr (Omeprazole) .... Take 2 tablet by mouth one time a day 6)  Triamcinolone Acetonide 0.1 % Crea (Triamcinolone acetonide) .... Apply to irritated skin three times daily.  please do not put on open cuts or your face. 7)  Lisinopril 10 Mg Tabs (Lisinopril) .... Take 1 tablet by mouth once a day 8)  Synthroid 25 Mcg Tabs (Levothyroxine sodium) .... Take 1 tablet by mouth daily.  Patient Instructions: 1)  Please schedule a follow-up appointment in 1 month recheck blood pressure and depressive symptoms. 2)  Remember to start taking Lisinopril 10mg  by mouth daily. 3)  Stop taking Paxil. 4)  Start taking Celexa 20mg  by mouth daily.  5)  Take all medication as prescribed. 6)  If you develop chest pain that is associated with feeling  your heart racing, profuse sweating that last for more than 30 minutes please go to the Emergency Room. 7)  You will be called with any abnormalities in the tests scheduled or performed today.  If you don't hear from [place] within  a week from when the test was performed, you can assume that your test was normal.  Prescriptions: LISINOPRIL 10 MG TABS (LISINOPRIL) Take 1 tablet by mouth once a day  #30 x 1   Entered and Authorized by:   Laren Everts MD   Signed by:   Laren Everts MD on 05/23/2009   Method used:   Electronically to        [facility] [address] [phone]* (retail)       [address]       [address]       [place], [place]  [address]       Ph: [phone]       Fax: [phone]   RxID:   [id number] TRIAMCINOLONE ACETONIDE 0.1 % CREA (TRIAMCINOLONE ACETONIDE) Apply to irritated skin three times daily.  Please do not put on open cuts or your face.  #1 tube x 1   Entered and Authorized by:   Laren Everts MD   Signed by:   Laren Everts MD on 05/23/2009   Method used:   Electronically to        [facility] [address] [phone]* (retail)       [address]       [address]       [place], [place]  [address]       Ph: [phone]       Fax: [phone]   RxID:   [id number] CELEXA 20 MG TABS (CITALOPRAM HYDROBROMIDE) Take 1 tablet by mouth once a day  #30 x 1   Entered and Authorized by:   Laren Everts MD   Signed by:   Laren Everts MD on 05/23/2009   Method used:   Electronically to        [facility] [address] [phone]* (retail)       [address]       [address]       [place], [place]  [address]       Ph: [phone]       Fax: [phone]   RxID:   [id number]   Prevention & Chronic Care Immunizations   Influenza vaccine: Fluvax 3+  (01/29/2009)    Tetanus booster: Not documented    Pneumococcal vaccine: Not documented    H. zoster vaccine: Not documented  Colorectal Screening    Hemoccult: Not documented    Colonoscopy: Abnormal  (09/24/2005)  Other Screening   Pap smear: Not documented    Mammogram: Not documented    DXA bone density scan: Not documented   Smoking status: never  (02/25/2009)  Lipids   Total Cholesterol: 180  (01/29/2009)   Lipid panel action/deferral: Lipid Panel ordered   LDL: 107  (01/29/2009)   LDL Direct: Not documented   HDL: 46  (01/29/2009)   Triglycerides: 134  (01/29/2009)    SGOT (AST): 19  (01/29/2009)   SGPT (ALT): 15  (01/29/2009) CMP ordered    Alkaline phosphatase: 84  (01/29/2009)   Total bilirubin: 0.7  (01/29/2009)    Lipid flowsheet reviewed?: Yes   Progress toward LDL goal: At goal  Hypertension   Last Blood Pressure: 156 / 93  (05/23/2009)   Serum creatinine: 1.00  (01/29/2009)   Serum potassium 5.1  (01/29/2009) CMP ordered     Hypertension flowsheet reviewed?: Yes   Progress toward BP goal: Unchanged  Self-Management Support :   Personal Goals (by the next clinic visit) :      Personal blood pressure goal:  150/100  (02/25/2009)     Personal LDL goal: 130  (02/25/2009)    Patient will work on the following items until the next clinic visit to reach self-care goals:     Medications and monitoring: take my medicines every day, bring all of my medications to every visit  (05/23/2009)     Eating: drink diet soda or water instead of juice or soda, eat more vegetables, eat foods that are low in salt, eat baked foods instead of fried foods  (05/23/2009)     Activity: take a 30 minute walk every day  (05/23/2009)     Other: Walking around in her house.  (05/23/2009)    Hypertension self-management support: Written self-care plan  (05/23/2009)   Hypertension self-care plan printed.    Lipid self-management support: Written self-care plan  (05/23/2009)   Lipid self-care plan printed.  Process Orders Check Orders Results:     [facility]: Check successful Tests Sent for requisitioning  (May 27, 2009 2:31 PM):     05/23/2009: [facility] -- T-TSH [id number] (signed)     05/23/2009: [facility] -- T-Comprehensive Metabolic Panel [[id number]] (signed)     05/23/2009: [facility] -- T-Lipid Profile [phone] (signed)     05/23/2009: [facility] -- T-CBC No Diff [[id number]] (signed)    Process Orders Check Orders Results:     [facility]: Check successful Tests Sent for requisitioning (May 27, 2009 2:31 PM):     05/23/2009: [facility] -- T-TSH [phone] (signed)     05/23/2009: [facility] -- T-Comprehensive Metabolic Panel [[id number]] (signed)     05/23/2009: [facility] -- T-Lipid Profile [phone] (signed)     05/23/2009: [facility] -- T-CBC No Diff [[id number]] (signed)

## 2010-06-02 NOTE — Assessment & Plan Note (Signed)
Summary: flu sx/gg  see last note   Vital Signs:  Patient profile:   70 year old female Height:      59 inches (149.86 cm) Weight:      254.0 pounds (116.38 kg) BMI:     51.49 Temp:     98.7 degrees F (37.06 degrees C) oral Pulse rate:   76 / minute BP sitting:   130 / 88  (left arm) Cuff size:   RIGHT  Vitals Entered By: Theotis Barrio NT II (July 15, 2009 11:11 AM) CC: MEDICATION REFILL /  ROUTINE OFFICE VISIT, Depression Is Patient Diabetic? No Pain Assessment Patient in pain? no      Nutritional Status BMI of > 30 = obese  Have you ever been in a relationship where you felt threatened, hurt or afraid?No   Does patient need assistance? Functional Status Self care Comments MEDICATION REFILL  / ROUTINE OFFICE VISIT   Primary Care Provider:  Nelda Bucks DO  CC:  MEDICATION REFILL /  ROUTINE OFFICE VISIT and Depression.  History of Present Illness: Lisa Haynes is a 70 year old Female with PMH/problems as outlined in the EMR, who presents to the [facility] for routine follow up. She doesn't have any new complaints, wants refills. She is confused about what medicine is for what and wants them explained. Recent phone call about cough, she says it is resolved now.   Depression History:      The patient denies a depressed mood most of the day but notes a diminished interest in her usual daily activities.        Suicide risk questions reveal that she does not feel like life is worth living.  The patient denies that she wishes that she were dead and denies that she has thought about ending her life.        Comments:  AT TIMES YES AND NO.   Preventive Screening-Counseling & Management  Alcohol-Tobacco     Smoking Status: never  Caffeine-Diet-Exercise     Does Patient Exercise: no  Current Medications (verified): 1)  Celexa 20 Mg Tabs (Citalopram Hydrobromide) .... Take 1 Tablet By Mouth Once A Day 2)  Ultram 50 Mg Tabs (Tramadol Hcl) .... Take 1 Tablet By Mouth Two Times A  Day As Needed 3)  Simvastatin 10 Mg Tabs (Simvastatin) .... Take 1 Tablet By Mouth Once A Day 4)  Calcarb 600/d 600-125 Mg-Unit Tabs (Calcium-Vitamin D) .... Take 1 Tablet By Mouth Three Times A Day 5)  Prilosec 20 Mg Cpdr (Omeprazole) .... Take 2 Tablet By Mouth One Time A Day 6)  Triamcinolone Acetonide 0.1 % Crea (Triamcinolone Acetonide) .... Apply To Irritated Skin Three Times Daily.  Please Do Not Put On Open Cuts or Your Face. 7)  Lisinopril 10 Mg Tabs (Lisinopril) .... Take 1 Tablet By Mouth Once A Day 8)  Synthroid 25 Mcg Tabs (Levothyroxine Sodium) .... Take 1 Tablet By Mouth Daily.  Allergies (verified): No Known Drug Allergies  Past History:  Past Medical History: Last updated: 07/13/2006 Bilateral knee pain Depression Hypertension Low back pain Osteoarthritis Osteoporosis  Family History: Last updated: 04/15/2008 Family History Depression  Social History: Last updated: 04/15/2008 Retired: motel work  Divorced Never Smoked Alcohol use-no Drug use-no Regular exercise-no  Risk Factors: Exercise: no (07/15/2009)  Risk Factors: Smoking Status: never (07/15/2009)  Review of Systems       Review of System: Negative except per HPI.    Physical Exam  General:  alert and overweight-appearing.  Lungs:  normal respiratory effort and normal breath sounds.   Heart:  normal rate and regular rhythm.   Abdomen:  soft and non-tender.   Pulses:  normal peripheral pulses  Extremities:  no cyanosis, clubbing or edema  Neurologic:  non focal.  Psych:  normally interactive.     Impression & Recommendations:  Problem # 1:  HYPOTHYROIDISM (ICD-244.9) Out of meds for a month now. Restart today and recheck TSH in about a months time.  Her updated medication list for this problem includes:    Synthroid 25 Mcg Tabs (Levothyroxine sodium) .Marland Kitchen... Take 1 tablet by mouth daily.  Problem # 2:  HYPERLIPIDEMIA (ICD-272.4) Continue simvastatin.  Chol: 156 (05/23/2009  7:17:00 PM)HDL:  49 (05/23/2009 7:17:00 PM)LDL:  90 (05/23/2009 7:17:00 PM)Tri:   AST:  21 (05/23/2009 7:17:00 PM) ALT:  14 (05/23/2009 7:17:00 PM)T. Bili:  0.8 (05/23/2009 7:17:00 PM) AP:  77 (05/23/2009 7:17:00 PM)   Her updated medication list for this problem includes:    Simvastatin 10 Mg Tabs (Simvastatin) .Marland Kitchen... Take 1 tablet by mouth once a day  Problem # 3:  HYPERTENSION (ICD-401.9) Well-controlled. Continue the current regimen.   Her updated medication list for this problem includes:    Lisinopril 10 Mg Tabs (Lisinopril) .Marland Kitchen... Take 1 tablet by mouth once a day  Problem # 4:  DEPRESSION (ICD-311) Feeling better with clexa 20, she thinks it is helping. Will increase the dose to 40 daily.  Her updated medication list for this problem includes:    Celexa 40 Mg Tabs (Citalopram hydrobromide) .Marland Kitchen... Take 1 tablet by mouth once a day  Complete Medication List: 1)  Celexa 40 Mg Tabs (Citalopram hydrobromide) .... Take 1 tablet by mouth once a day 2)  Ultram 50 Mg Tabs (Tramadol hcl) .... Take 1 tablet by mouth two times a day as needed 3)  Simvastatin 10 Mg Tabs (Simvastatin) .... Take 1 tablet by mouth once a day 4)  Calcarb 600/d 600-125 Mg-unit Tabs (Calcium-vitamin d) .... Take 1 tablet by mouth three times a day 5)  Prilosec 20 Mg Cpdr (Omeprazole) .... Take 2 tablet by mouth one time a day 6)  Triamcinolone Acetonide 0.1 % Crea (Triamcinolone acetonide) .... Apply to irritated skin three times daily.  please do not put on open cuts or your face. 7)  Lisinopril 10 Mg Tabs (Lisinopril) .... Take 1 tablet by mouth once a day 8)  Synthroid 25 Mcg Tabs (Levothyroxine sodium) .... Take 1 tablet by mouth daily.  Other Orders: Pneumococcal Vaccine (89381) Admin 1st Vaccine (01751)  Patient Instructions: 1)  Please schedule a follow-up appointment in 1 month.  Prescriptions: SIMVASTATIN 10 MG TABS (SIMVASTATIN) Take 1 tablet by mouth once a day  #30 x 11   Entered and Authorized by:    Lisa Haynes   Signed by:   Lisa Haynes on 07/15/2009   Method used:   Electronically to        [facility] [address] [phone]* (retail)       [address]       [address]       [place], [place]  [address]       Ph: [phone]       Fax: [phone]   RxID:   [id number] SYNTHROID 25 MCG TABS (LEVOTHYROXINE SODIUM) take 1 tablet by mouth daily.  #30 x 11   Entered and Authorized by:   Lisa Haynes   Signed by:   Lisa Haynes on  07/15/2009   Method used:   Electronically to        [facility] [address] [phone]* (retail)       [address]       [address]       [place], [place]  [address]       Ph: [phone]       Fax: [phone]   RxID:   [id number] LISINOPRIL 10 MG TABS (LISINOPRIL) Take 1 tablet by mouth once a day  #30 x 1   Entered and Authorized by:   Lisa Haynes   Signed by:   Lisa Haynes on 07/15/2009   Method used:   Electronically to        [facility] [address] [phone]* (retail)       [address]       [address]       [place], [place]  [address]       Ph: [phone]       Fax: [phone]   RxID:   [id number] CELEXA 40 MG TABS (CITALOPRAM HYDROBROMIDE) Take 1 tablet by mouth once a day  #31 x 6   Entered and Authorized by:   Lisa Haynes   Signed by:   Lisa Haynes on 07/15/2009   Method used:   Electronically to        [facility] [address] [phone]* (retail)       [address]       [address]       [place], [place]  [address]       Ph: [phone]       Fax: [phone]   RxID:   [id number] SIMVASTATIN 10 MG TABS (SIMVASTATIN) Take 1 tablet by mouth once a day  #30 x 11   Entered and Authorized by:   Lisa Haynes   Signed by:   Lisa Haynes on 07/15/2009   Method used:   Print then Give to Patient   RxID:   [id number]    Prevention & Chronic Care Immunizations   Influenza vaccine: Fluvax 3+  (01/29/2009)    Tetanus booster: Not documented    Pneumococcal vaccine:  Pneumovax (Medicare)  (07/15/2009)    H. zoster vaccine: Not documented  Colorectal Screening   Hemoccult: Not documented    Colonoscopy: Abnormal  (09/24/2005)  Other Screening   Pap smear: Not documented    Mammogram: Not documented   Mammogram action/deferral: Refused  (07/15/2009)    DXA bone density scan: Not documented   Smoking status: never  (07/15/2009)  Lipids   Total Cholesterol: 156  (05/23/2009)   Lipid panel action/deferral: Lipid Panel ordered   LDL: 90  (05/23/2009)   LDL Direct: Not documented   HDL: 49  (05/23/2009)   Triglycerides: 83  (05/23/2009)    SGOT (AST): 21  (05/23/2009)   SGPT (ALT): 14  (05/23/2009)   Alkaline phosphatase: 77  (05/23/2009)   Total bilirubin: 0.8  (05/23/2009)    Lipid flowsheet reviewed?: Yes   Progress toward LDL goal: At goal  Hypertension   Last Blood Pressure: 130 / 88  (07/15/2009)   Serum creatinine: 0.86  (05/23/2009)   Serum potassium 4.8  (05/23/2009)    Hypertension flowsheet reviewed?: Yes   Progress toward BP goal: At goal  Self-Management Support :   Personal Goals (by the next clinic visit) :      Personal blood pressure goal: 150/100  (02/25/2009)     Personal  LDL goal: 130  (02/25/2009)    Patient will work on the following items until the next clinic visit to reach self-care goals:     Medications and monitoring: take my medicines every day, bring all of my medications to every visit  (07/15/2009)     Eating: drink diet soda or water instead of juice or soda, eat more vegetables, use fresh or frozen vegetables, eat foods that are low in salt, eat baked foods instead of fried foods, eat fruit for snacks and desserts, limit or avoid alcohol  (07/15/2009)     Activity: take a 30 minute walk every day  (05/23/2009)     Other: Walking around in her house.  (05/23/2009)    Hypertension self-management support: Resources for patients handout  (07/15/2009)    Lipid self-management support: Resources for  patients handout  (07/15/2009)     Self-management comments: WILL GET OUT WHEN THE WEATHER GETS WARMER      Resource handout printed.   Nursing Instructions: Give Pneumovax today     Medication Administration  Injection # 1:    Medication: Allergy Injection (1)  Orders Added: 1)  Est. Patient Level II [16109] 2)  Pneumococcal Vaccine [90732] 3)  Admin 1st Vaccine [60454]    Immunizations Administered:  Pneumonia Vaccine:    Vaccine Type: Pneumovax (Medicare)    Site: right deltoid    Mfr: Merck    Dose: 0.5 ml    Route: IM    Given by: Shapale Watlington/ g gambaccini RN    Exp. Date: 12/15/2010    Lot #: [id number]    VIS given: 11/29/95 version given July 15, 2009.

## 2010-06-02 NOTE — Progress Notes (Signed)
Summary: phone/gg  Phone Note Call from Patient   Caller: Patient Summary of Call: Pt called asking for appointment for next week.  c/o low fever, flu signs. Her cough is gone. able to drink fluids, not eating well. pt offered appointment for tomorrow burt she refused.  Only able to come in next week. Appointment give, pt instructed to call if symptoms get worse and we will see her sooner. Patient/caller verbalizes understanding of these instructions.  Initial call taken by: Merrie Roof RN,  July 08, 2009 2:47 PM  Follow-up for Phone Call        Agree with your plan. Able to take by mouth.  Could find no underlying COPD/ asthma.  Follow-up by: Blanch Media MD,  July 08, 2009 3:17 PM

## 2010-06-02 NOTE — Discharge Summary (Signed)
Summary: Hospital Discharge Update    Hospital Discharge Update:  Date of Admission: 03/24/2010 Date of Discharge: 03/26/2010  Brief Summary:  Admitted for right lower lobe pneumonia and conjunctivitis, SOB.  She had elevated WBC.  She was placed on Vancomycin IV x 1 day and then switched to Zithromax 500mg  by mouth.  She improved significantly clinically over 2 days of hospitalization and her WBC trended down prior to discharge and remained afebrile.    Labs needed at follow-up: CBC with differential  Other labs needed at follow-up: Legionella antigen  Other follow-up issues:  1. Please make sure her bilateral conjunctivis is resolved.   2. Patient was mildly hypoxic with O2 sat 92-94% on 2L [place] and RA intermittently, so please recheck her O2 sat and evaluate to see if she needs home O2 longterm?   Problem list changes:  Added new problem of PNEUMONIA DUE TO OTHER SPECIFIED ORGANISM (ICD-483.8)  Medication list changes:  Added new medication of AZITHROMYCIN 500 MG TABS (AZITHROMYCIN) 1 tablet by mouth daily x 3 days - Signed Rx of AZITHROMYCIN 500 MG TABS (AZITHROMYCIN) 1 tablet by mouth daily x 3 days;  #3 x 0;  Signed;  Entered by: Rosana Berger MD;  Authorized by: Rosana Berger MD;  Method used: Handwritten  The medication, problem, and allergy lists have been updated.  Please see the dictated discharge summary for details.  Discharge medications:  CELEXA 40 MG TABS (CITALOPRAM HYDROBROMIDE) Take 1 tablet by mouth once a day ULTRAM 50 MG TABS (TRAMADOL HCL) Take 1 tablet by mouth two times a day as needed SIMVASTATIN 10 MG TABS (SIMVASTATIN) Take 1 tablet by mouth once a day CALCARB 600/D 600-125 MG-UNIT TABS (CALCIUM-VITAMIN D) Take 1 tablet by mouth three times a day PRILOSEC 20 MG CPDR (OMEPRAZOLE) Take 2 tablet by mouth one time a day TRIAMCINOLONE ACETONIDE 0.1 % CREA (TRIAMCINOLONE ACETONIDE) Apply to irritated skin three times daily.  Please do not put on open cuts or your  face. LISINOPRIL 10 MG TABS (LISINOPRIL) Take 1 tablet by mouth once a day SYNTHROID 25 MCG TABS (LEVOTHYROXINE SODIUM) take 1 tablet by mouth daily. MUPIROCIN 2 % OINT (MUPIROCIN) Apply two times a day to the sore, irritated area on your nose AZITHROMYCIN 500 MG TABS (AZITHROMYCIN) 1 tablet by mouth daily x 3 days  Other patient instructions:  1. Take Azithromycin 500mg  1 tablet by mouth daily x 3 days 2. Follow up with our internal medicine clinic in 3-5 days, our clinic will call you for an appointment, if you don't hear from [place], please call [phone]. 3. If you have increased shortness of breath, please call our clinic or go to the ED   Note: Hospital Discharge Medications & Other Instructions handout was printed, one copy for patient and a second copy to be placed in hospital chart.

## 2010-06-02 NOTE — Miscellaneous (Signed)
Summary: Hospital Admission...  INTERNAL MEDICINE ADMISSION HISTORY AND PHYSICAL Attending: Dr Lina Sayre 1st contact Dr Anselm Jungling [phone] 2nd contact Dr Gilford Rile  [phone]  PCP: Dr. Nelda Bucks  Holidays or 5pm on weekdays: 1st contact [phone] 2nd contact [phone]  CC: Cough, redness in L eye, abdominal pain  HPI: Pt is a 70 yo woman with PMH of HTN, OA, Depression, who comes to [facility] ED with c/o worsening cough, redness in L eye for a week now and  lower abdominal pain for past 3 weeks. She reports having cough which started a week ago and is getting worse, associated with clear sputum. Cough is present all day and gets worse when she lies down. Reports of subjective low grade fever and chills. Denies any headache, vision change, SOB. She does compalaint of some chest discomfort, dull, localized, non radiating, not associated with activity, gets worse with cough. She also has redness and discharge from her L eye which started around same time as her cough- which is also getting worse, its associated with itching. She can't see w/ her R eye. Denies any eye pain or photophobia. She c/o lower abdominal pain for last 3 weeks now, which is intermittent, moderate, colicky in nature. Pain is mostly all of lower abdomen but more on LLQ. Associated with some nausea, but no vomitting. Denies any diarrhea, constipation or dysuria or urinary frequency.   ALLERGIES: NKDA   PAST MEDICAL HISTORY: Bilateral knee pain Depression Hypertension Low back pain Osteoarthritis Osteoporosis  MEDICATIONS: CELEXA 40 MG TABS (CITALOPRAM HYDROBROMIDE) Take 1 tablet by mouth once a day ULTRAM 50 MG TABS (TRAMADOL HCL) Take 1 tablet by mouth two times a day as needed SIMVASTATIN 10 MG TABS (SIMVASTATIN) Take 1 tablet by mouth once a day CALCARB 600/D 600-125 MG-UNIT TABS (CALCIUM-VITAMIN D) Take 1 tablet by mouth three times a day PRILOSEC 20 MG CPDR (OMEPRAZOLE) Take 2 tablet by mouth one time a day LISINOPRIL  10 MG TABS (LISINOPRIL) Take 1 tablet by mouth once a day SYNTHROID 25 MCG TABS (LEVOTHYROXINE SODIUM) take 1 tablet by mouth daily.   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 Divorced Never Smoked Alcohol use-no Drug use-no Regular exercise-no   FAMILY HISTORY Family History of Depression    ROS: As per HPI, all other systems reviewed and negative  VITALS: T: 99.1 F   P: 94    BP:110/58    R: 18   O2SAT:94%  ON:RA  PHYSICAL EXAM: Gen: Patient is coughing intermittently and in little distress due to that. Eyes: Redness in L eye with clear watery discharge. R eye blindness.  PERRL, EOMI, No signs of anemia or jaundince. ENT: MMM, OP clear, No erythema, thrush or exudates. Neck: Supple, No carotid Bruits, No JVD, No thyromegaly Resp: CTA- Bilaterally, No W/C, some scattered ronchi. CVS: S1S2 RRR, No M/R/G GI: Abdomen is soft and obese.  ND, NT, NG, NR, BS+. No organomegaly. Ext: 2+ pedal edema B/L upto her knees. No cyanosis or clubbing. GU: No CVA tenderness. Skin: No visible rashes, scars. Lymph: No palpable lymphadenopathy. MS: Moving all 4 extremities. Neuro: A&O X3, CN II - XII are grossly intact. Motor strength is 5/5 in the all 4 extremities, Sensations intact to light touch. Psych: Appropriate   LABS: CBC:  WBC  14.8       h      4.0-10.5         K/uL  RBC                                      4.42              3.87-5.11        MIL/uL  Hemoglobin (HGB)                         12.3              12.0-15.0        g/dL  Hematocrit (HCT)                         39.2              36.0-46.0        %  MCV                                      88.7              78.0-100.0       fL  MCH -                                    27.8              26.0-34.0        pg  MCHC                                     31.4              30.0-36.0        g/dL  RDW                                      14.7              11.5-15.5         %  Platelet Count (PLT)                     313               150-400          K/uL  Neutrophils, %                           70                43-77            %  Lymphocytes, %                           16                12-46            %  Monocytes, %  13         h      3-12             %  Eosinophils, %                           0                 0-5              %  Basophils, %                             0                 0-1              %  Neutrophils, Absolute                    10.3       h      1.7-7.7          K/uL  Lymphocytes, Absolute                    2.4               0.7-4.0          K/uL  Monocytes, Absolute                      2.0        h      0.1-1.0          K/uL  Eosinophils, Absolute                    0.1               0.0-0.7          K/uL  Basophils, Absolute                      0.1               0.0-0.1          K/uL   CMET:  Sodium (NA)                              135               135-145          mEq/L  Potassium (K)                            4.8               3.5-5.1          mEq/L  Chloride                                 101               96-112           mEq/L  CO2  29                19-32            mEq/L  Glucose                                  101        h      70-99            mg/dL  BUN                                      10                6-23             mg/dL  Creatinine                               0.82              0.4-1.2          mg/dL  GFR, Est Non African American            >60               >60              mL/min  GFR, Est African American                >60               >60              mL/min    Oversized comment, see footnote  1  Bilirubin, Total                         0.7               0.3-1.2          mg/dL  Alkaline Phosphatase                     59                39-117           U/L  SGOT (AST)                               21                0-37              U/L  SGPT (ALT)                               14                0-35             U/L  Total  Protein                           7.3               6.0-8.3  g/dL  Albumin-Blood                            2.8        l      3.5-5.2          g/dL  Calcium                                  9.1               8.4-10.5         mg/dL   Lipase                                   23                11-59            U/L   Beta Natriuretic Peptide                 138.0      h      0.0-100.0        pg/mL   CKMB, POC                                1.1               1.0-8.0          ng/mL  Troponin I, POC                          <0.05             0.00-0.09        ng/mL  Myoglobin, POC                           187               12-200           ng/mL   CXR:   IMPRESSION:   Mild hyperexpansion and pulmonary vascular congestion without overt   edema.   CT abd and Pelvis w/ contrast:  IMPRESSION:   Cholelithiasis.   Stable low attenuation focus in lateral segment left lobe liver,   uncertain etiology but not significantly changed since 2008.   Distal colonic diverticulosis without evidence of diverticulitis.   No definite acute intra abdominal or intrapelvic process.  ASSESSMENT AND PLAN: 1) Cough w/ sputum: Considering Hx, PMH and exam, pts having cough most likely 2/2 LRI ( bacterial vs. viral ). Although due to long standing HTN and BNP -138 and cough getting worse on lying down, w/ 2+ pedal edema- CHF is a possiblity- but unlikely. - Will admit to telemetry bed. - Will Start her on IV Zithromax and IV Rocephin.  - Will repeat CXR PA and Lat. in am. - Will cycle CEx 2 considering chest discomfort( which is most likely due to severe coughing) and her risk factors including age, HTN and HLD. Will also repeat 12 lead EKG.  2) HTN: Considering her Hx of HTN and last ECHO in 12/2005 which showed EF- 55-65% and LV wall thickness in upper limts of nortmal, and also  admission BNP of 138- will do an  2-D ECHO. - Will continue Lisinopril.  3) HLD: Last FLP in 9/11- T. Chol- 116, TG-64, HDL-51, LDL-52. Will continue her Simvastatin and will not repeat lipid panel for now.  4) Low Back pain and osteoarthritis: Not actively complaining of any worsening pain now. - Will continue on her home analgesics.  5) Depression: Pt doesn't seem to be having any active issues as of now. Will continue her home meds for depression.  6) VTE Proph: Lovenox

## 2010-06-02 NOTE — Assessment & Plan Note (Signed)
Summary: FU VISIT/DS/MILLS   Vital Signs:  Patient profile:   70 year old female Height:      59 inches (149.86 cm) Weight:      244.2 pounds (111.00 kg) BMI:     49.50 Temp:     97.7 degrees F (36.50 degrees C) oral Pulse rate:   66 / minute BP sitting:   138 / 72  (right arm)  Vitals Entered By: Stanton Kidney Ditzler RN (Sep 11, 2009 11:23 AM) Is Patient Diabetic? No Pain Assessment Patient in pain? yes     Location: knees Intensity: 6 Type: hurts Onset of pain  long time Nutritional Status BMI of > 30 = obese Nutritional Status Detail appetite ok  Have you ever been in a relationship where you felt threatened, hurt or afraid?denies   Does patient need assistance? Functional Status Self care Ambulation Wheelchair Comments FU - doing sl better. Needs Rx for depend pads - has bladder leakage.   Primary Care Provider:  Nelda Bucks DO   History of Present Illness: 70 year old with Past Medical History: Bilateral knee pain Depression Hypertension Low back pain Osteoarthritis Osteoporosis    She is here for regular follow up. She has had urinary incontinence since 2004. She is not able to hold her urine. She refused referral to GYN. She doesnt have any other complaints. She needs refill for tramadol for her knee pain.   Depression History:      The patient denies a depressed mood most of the day and a diminished interest in her usual daily activities.         Preventive Screening-Counseling & Management  Alcohol-Tobacco     Smoking Status: never  Caffeine-Diet-Exercise     Does Patient Exercise: no  Current Medications (verified): 1)  Celexa 40 Mg Tabs (Citalopram Hydrobromide) .... Take 1 Tablet By Mouth Once A Day 2)  Ultram 50 Mg Tabs (Tramadol Hcl) .... Take 1 Tablet By Mouth Two Times A Day As Needed 3)  Simvastatin 10 Mg Tabs (Simvastatin) .... Take 1 Tablet By Mouth Once A Day 4)  Calcarb 600/d 600-125 Mg-Unit Tabs (Calcium-Vitamin D) .... Take 1 Tablet By  Mouth Three Times A Day 5)  Prilosec 20 Mg Cpdr (Omeprazole) .... Take 2 Tablet By Mouth One Time A Day 6)  Triamcinolone Acetonide 0.1 % Crea (Triamcinolone Acetonide) .... Apply To Irritated Skin Three Times Daily.  Please Do Not Put On Open Cuts or Your Face. 7)  Lisinopril 10 Mg Tabs (Lisinopril) .... Take 1 Tablet By Mouth Once A Day 8)  Synthroid 25 Mcg Tabs (Levothyroxine Sodium) .... Take 1 Tablet By Mouth Daily.  Allergies: No Known Drug Allergies  Review of Systems  The patient denies fever, chest pain, syncope, dyspnea on exertion, peripheral edema, prolonged cough, headaches, hemoptysis, abdominal pain, melena, and hematochezia.    Physical Exam  General:  alert, well-developed, and well-nourished.   Head:  normocephalic and atraumatic.   Lungs:  normal respiratory effort, no intercostal retractions, no accessory muscle use, and normal breath sounds.   Heart:  normal rate, regular rhythm, and no murmur.   Abdomen:  soft, non-tender, normal bowel sounds, and no distention.   Msk:  no joint swelling, no joint warmth, no redness over joints, and no joint deformities.   Neurologic:  alert & oriented X3 and cranial nerves II-XII intact.     Impression & Recommendations:  Problem # 1:  HYPOTHYROIDISM (ICD-244.9) Continue with syntrhoid. I will give refill.  Her updated  medication list for this problem includes:    Synthroid 25 Mcg Tabs (Levothyroxine sodium) .Marland Kitchen... Take 1 tablet by mouth daily.  Problem # 2:  HYPERTENSION (ICD-401.9) I will continue with lisinopril.  Her updated medication list for this problem includes:    Lisinopril 10 Mg Tabs (Lisinopril) .Marland Kitchen... Take 1 tablet by mouth once a day  BP today: 138/72 Prior BP: 130/88 (07/15/2009)  Labs Reviewed: K+: 4.8 (05/23/2009) Creat: : 0.86 (05/23/2009)   Chol: 156 (05/23/2009)   HDL: 49 (05/23/2009)   LDL: 90 (05/23/2009)   TG: 83 (05/23/2009)  Problem # 3:  KNEE PAIN, BILATERAL (ICD-719.46) I will give refill  for tramadol. no sign of infection.  Her updated medication list for this problem includes:    Ultram 50 Mg Tabs (Tramadol hcl) .Marland Kitchen... Take 1 tablet by mouth two times a day as needed  Complete Medication List: 1)  Celexa 40 Mg Tabs (Citalopram hydrobromide) .... Take 1 tablet by mouth once a day 2)  Ultram 50 Mg Tabs (Tramadol hcl) .... Take 1 tablet by mouth two times a day as needed 3)  Simvastatin 10 Mg Tabs (Simvastatin) .... Take 1 tablet by mouth once a day 4)  Calcarb 600/d 600-125 Mg-unit Tabs (Calcium-vitamin d) .... Take 1 tablet by mouth three times a day 5)  Prilosec 20 Mg Cpdr (Omeprazole) .... Take 2 tablet by mouth one time a day 6)  Triamcinolone Acetonide 0.1 % Crea (Triamcinolone acetonide) .... Apply to irritated skin three times daily.  please do not put on open cuts or your face. 7)  Lisinopril 10 Mg Tabs (Lisinopril) .... Take 1 tablet by mouth once a day 8)  Synthroid 25 Mcg Tabs (Levothyroxine sodium) .... Take 1 tablet by mouth daily.  Patient Instructions: 1)  Please schedule a follow-up appointment in 3 months. Prescriptions: SYNTHROID 25 MCG TABS (LEVOTHYROXINE SODIUM) take 1 tablet by mouth daily.  #30 x 3   Entered and Authorized by:   Hartley Barefoot MD   Signed by:   Hartley Barefoot MD on 09/11/2009   Method used:   Electronically to        [facility] [address] [phone]* (retail)       [address]       [address]       [place], [place]  [address]       Ph: [phone]       Fax: [phone]   RxID:   [id number] LISINOPRIL 10 MG TABS (LISINOPRIL) Take 1 tablet by mouth once a day  #30 x 3   Entered and Authorized by:   Hartley Barefoot MD   Signed by:   Hartley Barefoot MD on 09/11/2009   Method used:   Electronically to        [facility] [address] [phone]* (retail)       [address]       [address]       [place], [place]  [address]       Ph: [phone]       Fax: [phone]   RxID:   [id number] ULTRAM 50 MG TABS (TRAMADOL  HCL) Take 1 tablet by mouth two times a day as needed  #60 Each x 3   Entered and Authorized by:   Hartley Barefoot MD   Signed by:   Hartley Barefoot MD on 09/11/2009   Method used:   Electronically to        [facility] [address] [phone]* (retail)  [address]       [address]       [place], [place]  [address]       Ph: [phone]       Fax: [phone]   RxID:   [id number]   Prevention & Chronic Care Immunizations   Influenza vaccine: Fluvax 3+  (01/29/2009)    Tetanus booster: Not documented    Pneumococcal vaccine: Pneumovax (Medicare)  (07/15/2009)    H. zoster vaccine: Not documented  Colorectal Screening   Hemoccult: Not documented    Colonoscopy: Abnormal  (09/24/2005)  Other Screening   Pap smear: Not documented    Mammogram: Not documented   Mammogram action/deferral: Refused  (07/15/2009)    DXA bone density scan: Not documented   Smoking status: never  (09/11/2009)  Lipids   Total Cholesterol: 156  (05/23/2009)   Lipid panel action/deferral: Lipid Panel ordered   LDL: 90  (05/23/2009)   LDL Direct: Not documented   HDL: 49  (05/23/2009)   Triglycerides: 83  (05/23/2009)    SGOT (AST): 21  (05/23/2009)   SGPT (ALT): 14  (05/23/2009)   Alkaline phosphatase: 77  (05/23/2009)   Total bilirubin: 0.8  (05/23/2009)  Hypertension   Last Blood Pressure: 138 / 72  (09/11/2009)   Serum creatinine: 0.86  (05/23/2009)   Serum potassium 4.8  (05/23/2009)  Self-Management Support :   Personal Goals (by the next clinic visit) :      Personal blood pressure goal: 150/100  (02/25/2009)     Personal LDL goal: 130  (02/25/2009)    Patient will work on the following items until the next clinic visit to reach self-care goals:     Medications and monitoring: take my medicines every day, bring all of my medications to every visit  (09/11/2009)     Eating: drink diet soda or water instead of juice or soda, eat more vegetables, use fresh or frozen  vegetables, eat foods that are low in salt, eat baked foods instead of fried foods, eat fruit for snacks and desserts, limit or avoid alcohol  (09/11/2009)     Activity: take a 30 minute walk every day  (09/11/2009)     Other: Walking around in her house.  (05/23/2009)    Hypertension self-management support: Written self-care plan, Education handout, Resources for patients handout  (09/11/2009)   Hypertension self-care plan printed.   Hypertension education handout printed    Lipid self-management support: Written self-care plan, Education handout, Resources for patients handout  (09/11/2009)   Lipid self-care plan printed.   Lipid education handout printed      Resource handout printed.

## 2010-06-02 NOTE — Assessment & Plan Note (Signed)
Summary: ACUTE-HFU-(MILLS)/CFB   Vital Signs:  Patient profile:   70 year old female Height:      59 inches (149.86 cm) Weight:      237.3 pounds (107.86 kg) BMI:     48.10 Temp:     97.3 degrees F (36.28 degrees C) oral Pulse rate:   65 / minute BP sitting:   157 / 79  (left arm) Cuff size:   large  Vitals Entered By: Theotis Barrio NT II (April 03, 2010 11:22 AM) CC: HOSPITAL FOLLOW UP /  MEDICATION REFILL /   Is Patient Diabetic? No Pain Assessment Patient in pain? no      Nutritional Status BMI of > 30 = obese  Have you ever been in a relationship where you felt threatened, hurt or afraid?No   Does patient need assistance? Functional Status Self care Ambulation Normal Comments HOSPITAL FOLLOW UP    Primary Care [place]:  Nelda Bucks DO  CC:  HOSPITAL FOLLOW UP /  MEDICATION REFILL /  .  History of Present Illness: 70 yo woman presented for hospital follow-up.  She was recently admitted for right lower lobe pneumonia and was treated with a course of antibiotic- Azithromycin.  She was sent home with an extra 3 days of Azithromycin in which she finished.  Patient is doing well and has no other complaints except for occasion cough.    Depression History:      The patient denies a depressed mood most of the day and a diminished interest in her usual daily activities.         Preventive Screening-Counseling & Management  Alcohol-Tobacco     Alcohol drinks/day: 0     Smoking Status: never  Caffeine-Diet-Exercise     Does Patient Exercise: no  Allergies: No Known Drug Allergies  Review of Systems  The patient denies anorexia, fever, weight loss, weight gain, vision loss, decreased hearing, hoarseness, chest pain, syncope, dyspnea on exertion, peripheral edema, prolonged cough, headaches, hemoptysis, abdominal pain, melena, hematochezia, severe indigestion/heartburn, hematuria, incontinence, genital sores, muscle weakness, suspicious skin lesions, transient  blindness, difficulty walking, depression, unusual weight change, abnormal bleeding, enlarged lymph nodes, angioedema, breast masses, and testicular masses.    Physical Exam  General:  alert, well-developed, well-nourished, and well-hydrated.   Lungs:  normal respiratory effort, no intercostal retractions, no accessory muscle use, normal breath sounds, no crackles, and no wheezes.   Heart:  normal rate, regular rhythm, no murmur, and no gallop.   Abdomen:  soft, non-tender, normal bowel sounds, no distention, no masses, and no guarding.   Pulses:  R and L carotid,radial,femoral,dorsalis pedis and posterior tibial pulses are full and equal bilaterally Extremities:  1+ left pedal edema and 1+ right pedal edema.     Impression & Recommendations:  Problem # 1:  PNEUMONIA DUE TO OTHER SPECIFIED ORGANISM (ICD-483.8)  Clinically improving, only has a mild cough intermittently now.  She finished her abx course and will come back for a repeat chest xray in 4-6 weeks with Dr. Arvilla Market.  Negative influenza PCR and urinary Legionella antigen.    The following medications were removed from the medication list:    Azithromycin 500 Mg Tabs (Azithromycin) .Marland Kitchen... 1 tablet by mouth daily x 3 days  Problem # 2:  HYPERTENSION (ICD-401.9) Not well-controlled.  I will not adjust her HTN meds today because she will follow up with her PCP in 4 wks.    Her updated medication list for this problem includes:  Lisinopril 10 Mg Tabs (Lisinopril) .Marland Kitchen... Take 1 tablet by mouth once a day  Problem # 3:  HYPOTHYROIDISM (ICD-244.9) Continue current medication.  Last TSH level was 2.372 on 03/24/2010.   Her updated medication list for this problem includes:    Synthroid 25 Mcg Tabs (Levothyroxine sodium) .Marland Kitchen... Take 1 tablet by mouth daily.  Complete Medication List: 1)  Celexa 40 Mg Tabs (Citalopram hydrobromide) .... Take 1 tablet by mouth once a day 2)  Ultram 50 Mg Tabs (Tramadol hcl) .... Take 1 tablet by mouth two  times a day as needed 3)  Simvastatin 10 Mg Tabs (Simvastatin) .... Take 1 tablet by mouth once a day 4)  Calcarb 600/d 600-125 Mg-unit Tabs (Calcium-vitamin d) .... Take 1 tablet by mouth three times a day 5)  Prilosec 20 Mg Cpdr (Omeprazole) .... Take 2 tablet by mouth one time a day 6)  Triamcinolone Acetonide 0.1 % Crea (Triamcinolone acetonide) .... Apply to irritated skin three times daily.  please do not put on open cuts or your face. 7)  Lisinopril 10 Mg Tabs (Lisinopril) .... Take 1 tablet by mouth once a day 8)  Synthroid 25 Mcg Tabs (Levothyroxine sodium) .... Take 1 tablet by mouth daily. 9)  Mupirocin 2 % Oint (Mupirocin) .... Apply two times a day to the sore, irritated area on your nose  Patient Instructions: 1)  Please follow up with Dr. Arvilla Market on January 3rd at [facility] 2)  You will need to get a chest Xray on that day. 3)  Continue all current medications   Orders Added: 1)  Est. Patient Level III [[address]]     Prevention & Chronic Care Immunizations   Influenza vaccine: Fluvax MCR  (01/02/2010)    Tetanus booster: Not documented    Pneumococcal vaccine: Pneumovax (Medicare)  (07/15/2009)    H. zoster vaccine: Not documented  Colorectal Screening   Hemoccult: Not documented    Colonoscopy: Abnormal  (09/24/2005)   Colonoscopy action/deferral: Deferred  (01/02/2010)  Other Screening   Pap smear: Not documented   Pap smear action/deferral: Not indicated S/P hysterectomy  (01/02/2010)    Mammogram: Not documented   Mammogram action/deferral: Refused  (07/15/2009)    DXA bone density scan: Not documented   Smoking status: never  (04/03/2010)  Lipids   Total Cholesterol: 116  (01/02/2010)   Lipid panel action/deferral: Lipid Panel ordered   LDL: 52  (01/02/2010)   LDL Direct: Not documented   HDL: 51  (01/02/2010)   Triglycerides: 64  (01/02/2010)    SGOT (AST): 14  (01/02/2010)   SGPT (ALT): 9  (01/02/2010)   Alkaline phosphatase: 61  (01/02/2010)    Total bilirubin: 0.8  (01/02/2010)  Hypertension   Last Blood Pressure: 157 / 79  (04/03/2010)   Serum creatinine: 0.91  (01/02/2010)   Serum potassium 4.6  (01/02/2010)  Self-Management Support :   Personal Goals (by the next clinic visit) :      Personal blood pressure goal: 150/100  (02/25/2009)     Personal LDL goal: 130  (02/25/2009)    Patient will work on the following items until the next clinic visit to reach self-care goals:     Medications and monitoring: take my medicines every day, bring all of my medications to every visit  (04/03/2010)     Eating: use fresh or frozen vegetables, eat foods that are low in salt, eat baked foods instead of fried foods  (04/03/2010)     Activity: take a 30 minute walk  every day  (04/03/2010)     Other: Walking around in her house.  (05/23/2009)    Hypertension self-management support: Resources for patients handout  (04/03/2010)    Lipid self-management support: Resources for patients handout  (04/03/2010)        Resource handout printed.   Appended Document: ACUTE-HFU-(MILLS)/CFB Ms. Courser history, physical examination, and recent CXRs were reviewed with Dr. Anselm Jungling and we formulated the assessment and plan together.  I agree with the documentation above and the need to follow-up with a repeat CXR on 05/05/2010 to assure resolution of the RLL pneumonia (best seen on the lateral view).

## 2010-06-02 NOTE — Progress Notes (Signed)
Summary: puffy feet/ hla  Phone Note Other Incoming   Summary of Call: [facility] calls to say pt's feet are puffy, no pitting edema, no shortness of breath, no chest pain, vital signs stable. she spoke w/ pt RE: diet and fluid intake.  [phone] Initial call taken by: Marin Roberts RN,  April 06, 2010 3:48 PM  Follow-up for Phone Call        Please advise [facility] to watch for any change in VS or new symptoms (SOB, C/P, increased swelling, erythema, etc) and to bring pt in if she develops any concerning symptoms.  Thank you! Follow-up by: Nelda Bucks DO,  April 07, 2010 7:57 AM     Appended Document: puffy feet/ hla done

## 2010-06-02 NOTE — Medication Information (Signed)
Summary: CYCLOBENZAPRINE  CYCLOBENZAPRINE   Imported By: Margie Billet 09/08/2009 10:37:34  _____________________________________________________________________  External Attachment:    Type:   Image     Comment:   External Document

## 2010-06-02 NOTE — Assessment & Plan Note (Signed)
Summary: est-ck/fu/med/cfb   Vital Signs:  Patient profile:   70 year old female Height:      59 inches (149.86 cm) Weight:      244.2 pounds (111.00 kg) Temp:     97.5 degrees F (36.39 degrees C) oral Pulse rate:   64 / minute BP sitting:   138 / 82  (left arm)  Vitals Entered By: Chinita Pester RN (January 02, 2010 10:40 AM) CC: F/U visit. BP  re-check. "I bumps into things somtimes.", Lipid Management Is Patient Diabetic? No Pain Assessment Patient in pain? no      Nutritional Status BMI of > 30 = obese  Have you ever been in a relationship where you felt threatened, hurt or afraid?No   Does patient need assistance? Functional Status Self care Ambulation Impaired:Risk for fall Comments Lives alone; uses a walker and cane.  Son stays with her sometimes, but she "waits" on him.    Primary Care Provider:  Nelda Bucks DO  CC:  F/U visit. BP  re-check. "I bumps into things somtimes." and Lipid Management.  History of Present Illness: 70 y/o F with PMH outlined in the EMR who presents today for routine f/u.  1. OA/Knee pain: Pt feels her ability to ambulate is overall improving but has sustained a few bumps over the past week 2/2 not using her walker at home.  Pt refuses PT referral at this time but will consider referral in the future.  Pain is well controlled with tramadol.  2 HTN: pt taking meds regularly without adverse effect. Denies h/a, visual changes,syncope, weakness, numbess, c/p, or sob.  3.  SKin lesion: abrasion noted on pts nose.  Her glasses have broken and are missing a pad on the left nasal bridge.  This has irritated the skin and casued discomfort for the pt.  SHe has an appt this week with her eye doctor for an exam and new pair of glasses. She denies any purulent drainage from the wound, visual changes, fever,chills, or other complaint.  She has no other concerns or complaints today.    Depression History:      The patient denies a depressed mood  most of the day and a diminished interest in her usual daily activities.        Comments:  "I cry sometimes." .  Lipid Management History:      Positive NCEP/ATP III risk factors include female age 34 years old or older and hypertension.  Negative NCEP/ATP III risk factors include non-tobacco-user status.      Preventive Screening-Counseling & Management  Alcohol-Tobacco     Alcohol drinks/day: 0     Smoking Status: never  Caffeine-Diet-Exercise     Does Patient Exercise: no  Current Medications (verified): 1)  Celexa 40 Mg Tabs (Citalopram Hydrobromide) .... Take 1 Tablet By Mouth Once A Day 2)  Ultram 50 Mg Tabs (Tramadol Hcl) .... Take 1 Tablet By Mouth Two Times A Day As Needed 3)  Simvastatin 10 Mg Tabs (Simvastatin) .... Take 1 Tablet By Mouth Once A Day 4)  Calcarb 600/d 600-125 Mg-Unit Tabs (Calcium-Vitamin D) .... Take 1 Tablet By Mouth Three Times A Day 5)  Prilosec 20 Mg Cpdr (Omeprazole) .... Take 2 Tablet By Mouth One Time A Day 6)  Triamcinolone Acetonide 0.1 % Crea (Triamcinolone Acetonide) .... Apply To Irritated Skin Three Times Daily.  Please Do Not Put On Open Cuts or Your Face. 7)  Lisinopril 10 Mg Tabs (Lisinopril) .... Take 1 Tablet  By Mouth Once A Day 8)  Synthroid 25 Mcg Tabs (Levothyroxine Sodium) .... Take 1 Tablet By Mouth Daily. 9)  Mupirocin 2 % Oint (Mupirocin) .... Apply Two Times A Day To The Sore, Irritated Area On Your Nose  Allergies (verified): No Known Drug Allergies  Past History:  Past medical, surgical, family and social histories (including risk factors) reviewed for relevance to current acute and chronic problems.  Past Medical History: Reviewed history from 07/13/2006 and no changes required. Bilateral knee pain Depression Hypertension Low back pain Osteoarthritis Osteoporosis  Past Surgical History: Hysterectomy with unilateral oophorectomy  Family History: Reviewed history from 04/15/2008 and no changes required. Family  History Depression  Social History: Reviewed history from 04/15/2008 and no changes required. Retired: motel work  Divorced Never Smoked Alcohol use-no Drug use-no Regular exercise-no  Physical Exam  General:  alert, well-developed, and well-nourished.   Head:  normocephalic and atraumatic.   Eyes:  vision grossly intact.  PERRLA.EOMI.  Anicteric.  No conjunctival injection or pallor Mouth:  pharynx pink and moist and dentures in placet, no erythema, no exudates, and edentulous.   Neck:  supple.  full ROM and no masses.   Lungs:  normal respiratory effort, no intercostal retractions, no accessory muscle use, and normal breath sounds.   Heart:  normal rate, regular rhythm, and no murmur.   Abdomen:  soft, non-tender, normal bowel sounds, and no distention.   Extremities:  +2 pitting edema bilaterally.. CHronic venous stasis changes present in bilateral LEs Neurologic:  alert & oriented X3 and cranial nerves II-XII intact.   Skin:  turgor normal, color normal, and no rashes.   Psych:  Oriented X3, memory intact for recent and remote, normally interactive, good eye contact, not anxious appearing, and not depressed appearing.     Impression & Recommendations:  Problem # 1:  HYPERLIPIDEMIA (ICD-272.4) Pt is tolerating simvastatin well. She is fasting today,will check FLP an LFTs today.  Her updated medication list for this problem includes:    Simvastatin 10 Mg Tabs (Simvastatin) .Marland Kitchen... Take 1 tablet by mouth once a day  Orders: T-Lipid Profile ([id number]) T-CMP with Estimated GFR ([id number])  Problem # 2:  HYPOTHYROIDISM (ICD-244.9) Pt is without symptoms of thyroid dysfunction.  Will check TSH today to assess adequacy of current synthroid dose.  Her updated medication list for this problem includes:    Synthroid 25 Mcg Tabs (Levothyroxine sodium) .Marland Kitchen... Take 1 tablet by mouth daily.  Orders: T-TSH ([id number])  Problem # 3:  SKIN LESION (ICD-709.9) Advised pt to keep  a banage or gauze in place at the site of her nasal abrasion resulting from her broken glasses.  WIll presribe bactroban ointment for pt to use while her wound heals. She has an appt with her eye doctor next week for new glasses.  Problem # 4:  HYPERTENSION (ICD-401.9) WIll continue with current regimen for now and check CMET today.  Her updated medication list for this problem includes:    Lisinopril 10 Mg Tabs (Lisinopril) .Marland Kitchen... Take 1 tablet by mouth once a day  Orders: T-Lipid Profile ([id number]) T-CMP with Estimated GFR ([id number])  BP today: 138/82 Prior BP: 138/72 (09/11/2009)  10 Yr Risk Heart Disease: 8 %  Labs Reviewed: K+: 4.8 (05/23/2009) Creat: : 0.86 (05/23/2009)   Chol: 156 (05/23/2009)   HDL: 49 (05/23/2009)   LDL: 90 (05/23/2009)   TG: 83 (05/23/2009)  Problem # 5:  OSTEOARTHRITIS (ICD-715.90)  Her updated medication list for this problem includes:    Ultram  50 Mg Tabs (Tramadol hcl) .Marland Kitchen... Take 1 tablet by mouth two times a day as needed  Discussed use of medications, application of heat or cold, and exercises.   Tramadol is helping to control pain well; will write refill.  Complete Medication List: 1)  Celexa 40 Mg Tabs (Citalopram hydrobromide) .... Take 1 tablet by mouth once a day 2)  Ultram 50 Mg Tabs (Tramadol hcl) .... Take 1 tablet by mouth two times a day as needed 3)  Simvastatin 10 Mg Tabs (Simvastatin) .... Take 1 tablet by mouth once a day 4)  Calcarb 600/d 600-125 Mg-unit Tabs (Calcium-vitamin d) .... Take 1 tablet by mouth three times a day 5)  Prilosec 20 Mg Cpdr (Omeprazole) .... Take 2 tablet by mouth one time a day 6)  Triamcinolone Acetonide 0.1 % Crea (Triamcinolone acetonide) .... Apply to irritated skin three times daily.  please do not put on open cuts or your face. 7)  Lisinopril 10 Mg Tabs (Lisinopril) .... Take 1 tablet by mouth once a day 8)  Synthroid 25 Mcg Tabs (Levothyroxine sodium) .... Take 1 tablet by mouth daily. 9)   Mupirocin 2 % Oint (Mupirocin) .... Apply two times a day to the sore, irritated area on your nose  Other Orders: Influenza Vaccine MCR (09811)  Lipid Assessment/Plan:      Based on NCEP/ATP III, the patient's risk factor category is "2 or more risk factors and a calculated 10 year CAD risk of < 20%".  The patient's lipid goals are as follows: Total cholesterol goal is 200; LDL cholesterol goal is 130; HDL cholesterol goal is 40; Triglyceride goal is 150.    Patient Instructions: 1)  Please schedule a follow-up appointment in 3 months with Dr Arvilla Market (Dec 2,2011 ,if possible). 2)  We will call you if your lab work is abnormal. 3)  Use your walker as much as possible while you are at home to prevent accidental falls. 4)  Bactroban (mupirocin) is an antibiotic cream.  Apply this to the area on your nose where it is irritated from your glasses.  Try to keep a band-aid or gauze on this area to prevent worsening irritation until your glasses are replaced. 5)  Your prescriptions were sent to your [facility] pharmacy and will be ready for you to pick up. 6)  Keep you legs elevated as much as possible at home to help with swelling. Prescriptions: MUPIROCIN 2 % OINT (MUPIROCIN) Apply two times a day to the sore, irritated area on your nose  #15gm tube x 2   Entered and Authorized by:   Nelda Bucks DO   Signed by:   Nelda Bucks DO on 01/02/2010   Method used:   Electronically to        [facility] [phone]* (retail)       [address]       [address]       [place], [place]  [address]       Ph: [phone]       Fax: [phone]   RxID:   [id number] ULTRAM 50 MG TABS (TRAMADOL HCL) Take 1 tablet by mouth two times a day as needed  #60 x 6   Entered and Authorized by:   Nelda Bucks DO   Signed by:   Nelda Bucks DO on 01/02/2010   Method used:   Electronically to        [facility] [address] [phone]* (retail)       [address]       [address]       [place],  [place]  [address]       Ph: [phone]       Fax: [phone]   RxID:   [id number]   Prevention & Chronic Care Immunizations   Influenza vaccine: Fluvax MCR  (01/02/2010)    Tetanus booster: Not documented    Pneumococcal vaccine: Pneumovax (Medicare)  (07/15/2009)    H. zoster vaccine: Not documented  Colorectal Screening   Hemoccult: Not documented    Colonoscopy: Abnormal  (09/24/2005)   Colonoscopy action/deferral: Deferred  (01/02/2010)  Other Screening   Pap smear: Not documented   Pap smear action/deferral: Not indicated S/P hysterectomy  (01/02/2010)    Mammogram: Not documented   Mammogram action/deferral: Refused  (07/15/2009)    DXA bone density scan: Not documented   Smoking status: never  (01/02/2010)  Lipids   Total Cholesterol: 156  (05/23/2009)   Lipid panel action/deferral: Lipid Panel ordered   LDL: 90  (05/23/2009)   LDL Direct: Not documented   HDL: 49  (05/23/2009)   Triglycerides: 83  (05/23/2009)    SGOT (AST): 21  (05/23/2009)   SGPT (ALT): 14  (05/23/2009)   Alkaline phosphatase: 77  (05/23/2009)   Total bilirubin: 0.8  (05/23/2009)    Lipid flowsheet reviewed?: Yes   Progress toward LDL goal: At goal  Hypertension   Last Blood Pressure: 138 / 82  (01/02/2010)   Serum creatinine: 0.86  (05/23/2009)   Serum potassium 4.8  (05/23/2009)    Hypertension flowsheet reviewed?: Yes   Progress toward BP goal: Unchanged  Self-Management Support :   Personal Goals (by the next clinic visit) :      Personal blood pressure goal: 150/100  (02/25/2009)     Personal LDL goal: 130  (02/25/2009)    Patient will work on the following items until the next clinic visit to reach self-care goals:     Medications and monitoring: take my medicines every day, bring all of my medications to every visit  (01/02/2010)     Eating: use fresh or frozen vegetables, eat foods that are low in salt, eat baked foods instead of fried foods  (01/02/2010)      Activity: take a 30 minute walk every day  (09/11/2009)     Other: Walking around in her house.  (05/23/2009)    Hypertension self-management support: Written self-care plan  (01/02/2010)   Hypertension self-care plan printed.    Lipid self-management support: Written self-care plan  (01/02/2010)   Lipid self-care plan printed.   Nursing Instructions: Give Flu vaccine today   Process Orders Check Orders Results:     [facility]: Check successful Order queued for requisitioning for [facility]: January 02, 2010 11:48 AM  Tests Sent for requisitioning (January 02, 2010 1:51 PM):     01/02/2010: [facility] -- T-Lipid Profile [phone] (signed)     01/02/2010: [facility] -- T-TSH [phone] (signed)     01/02/2010: [facility] -- T-CMP with Estimated GFR [[id number]] (signed)      Influenza Vaccine    Vaccine Type: Fluvax MCR    Site: right deltoid    Mfr: GlaxoSmithKline    Dose: 0.5 ml    Route: IM    Given by: Chinita Pester RN    Exp. Date: 10/31/2010    Lot #: [id number]    VIS given: 11/25/09 version given January 02, 2010.  Flu Vaccine Consent Questions    Do you have a history of severe allergic reactions to this vaccine? no    Any prior history of allergic reactions to egg and/or gelatin? no    Do you have a sensitivity to the preservative Thimersol? no    Do you have a past history of Guillan-Barre Syndrome? no    Do you currently have an acute febrile illness? no    Have you ever had a severe reaction to latex? no    Vaccine information given and explained to patient? yes    Are you currently pregnant? no  Process Orders Check Orders Results:     [facility]: Check successful Order queued for requisitioning for [facility]: January 02, 2010 11:48 AM  Tests Sent for requisitioning (January 02, 2010 1:51 PM):     01/02/2010: [facility] -- T-Lipid  Profile [phone] (signed)     01/02/2010: [facility] -- T-TSH [phone] (signed)     01/02/2010: [facility] -- T-CMP with Estimated GFR [[id number]] (signed)

## 2010-06-02 NOTE — Miscellaneous (Signed)
Summary: [facility]: CMN  [facility]: CMN   Imported By: Florinda Marker 07/01/2009 10:30:49  _____________________________________________________________________  External Attachment:    Type:   Image     Comment:   External Document

## 2010-06-04 NOTE — Miscellaneous (Signed)
Summary: [facility]DERS  ADVANCED ORDERS   Imported By: Shon Hough 05/15/2010 11:47:10  _____________________________________________________________________  External Attachment:    Type:   Image     Comment:   External Document

## 2010-06-04 NOTE — Letter (Signed)
Summary: CMN-ADVANCED  CMN-ADVANCED   Imported By: Shon Hough 04/16/2010 10:30:47  _____________________________________________________________________  External Attachment:    Type:   Image     Comment:   External Document

## 2010-06-04 NOTE — Miscellaneous (Signed)
Summary: [facility]   Imported By: Shon Hough 04/21/2010 14:45:27  _____________________________________________________________________  External Attachment:    Type:   Image     Comment:   External Document

## 2010-06-04 NOTE — Miscellaneous (Signed)
Summary: [facility] CERTIFICATION  [facility] CERTIFICATION   Imported By: Shon Hough 05/15/2010 11:46:34  _____________________________________________________________________  External Attachment:    Type:   Image     Comment:   External Document

## 2010-06-04 NOTE — Progress Notes (Signed)
Summary: refill/ hla  Phone Note Call from Patient   Summary of Call: ami, [facility], calls to ask for 2 more visits for education, may we say yes ami [phone] Initial call taken by: Marin Roberts RN,  April 15, 2010 2:21 PM  Follow-up for Phone Call        Yes.  Additional visits are approved. Follow-up by: Nelda Bucks DO,  April 15, 2010 4:33 PM

## 2010-06-04 NOTE — Miscellaneous (Signed)
Summary: [facility]: Verbal Order  [facility]: Verbal Order   Imported By: Florinda Marker 05/14/2010 10:22:18  _____________________________________________________________________  External Attachment:    Type:   Image     Comment:   External Document

## 2010-07-10 ENCOUNTER — Other Ambulatory Visit: Payer: Self-pay | Admitting: Internal Medicine

## 2010-07-14 LAB — INFLUENZA PANEL BY PCR (TYPE A & B)
Influenza A By PCR: NEGATIVE
Influenza B By PCR: NEGATIVE

## 2010-07-14 LAB — CBC
HCT: 34.2 % — ABNORMAL LOW (ref 36.0–46.0)
HCT: 34.9 % — ABNORMAL LOW (ref 36.0–46.0)
HCT: 35.8 % — ABNORMAL LOW (ref 36.0–46.0)
HCT: 37.2 % (ref 36.0–46.0)
HCT: 39.2 % (ref 36.0–46.0)
Hemoglobin: 10.7 g/dL — ABNORMAL LOW (ref 12.0–15.0)
Hemoglobin: 10.9 g/dL — ABNORMAL LOW (ref 12.0–15.0)
Hemoglobin: 11.2 g/dL — ABNORMAL LOW (ref 12.0–15.0)
Hemoglobin: 11.8 g/dL — ABNORMAL LOW (ref 12.0–15.0)
Hemoglobin: 12.3 g/dL (ref 12.0–15.0)
MCH: 27.7 pg (ref 26.0–34.0)
MCH: 27.9 pg (ref 26.0–34.0)
MCH: 28.3 pg (ref 26.0–34.0)
MCHC: 31.3 g/dL (ref 30.0–36.0)
MCHC: 31.3 g/dL (ref 30.0–36.0)
MCHC: 31.4 g/dL (ref 30.0–36.0)
MCHC: 31.7 g/dL (ref 30.0–36.0)
MCV: 88.7 fL (ref 78.0–100.0)
MCV: 89.2 fL (ref 78.0–100.0)
RDW: 14.7 % (ref 11.5–15.5)
RDW: 14.9 % (ref 11.5–15.5)
WBC: 14.4 10*3/uL — ABNORMAL HIGH (ref 4.0–10.5)

## 2010-07-14 LAB — COMPREHENSIVE METABOLIC PANEL
ALT: 14 U/L (ref 0–35)
Alkaline Phosphatase: 59 U/L (ref 39–117)
BUN: 10 mg/dL (ref 6–23)
CO2: 29 mEq/L (ref 19–32)
Calcium: 9.1 mg/dL (ref 8.4–10.5)
GFR calc non Af Amer: >60 mL/min (ref >60)
Glucose, Bld: 101 mg/dL — ABNORMAL HIGH (ref 70–99)
Potassium: 4.8 mEq/L (ref 3.5–5.1)
Sodium: 135 mEq/L (ref 135–145)
Total Protein: 7.3 g/dL (ref 6.0–8.3)

## 2010-07-14 LAB — CARDIAC PANEL(CRET KIN+CKTOT+MB+TROPI)
CK, MB: 2 ng/mL (ref 0.3–4.0)
CK, MB: 2.5 ng/mL (ref 0.3–4.0)
Relative Index: 1.3 (ref 0.0–2.5)
Troponin I: 0.02 ng/mL (ref 0.00–0.06)

## 2010-07-14 LAB — BASIC METABOLIC PANEL
BUN: 8 mg/dL (ref 6–23)
CO2: 26 mEq/L (ref 19–32)
CO2: 29 mEq/L (ref 19–32)
Calcium: 8.7 mg/dL (ref 8.4–10.5)
Chloride: 101 mEq/L (ref 96–112)
GFR calc non Af Amer: >60 mL/min (ref >60)
GFR calc non Af Amer: >60 mL/min (ref >60)
GFR calc non Af Amer: >60 mL/min (ref >60)
Glucose, Bld: 103 mg/dL — ABNORMAL HIGH (ref 70–99)
Glucose, Bld: 104 mg/dL — ABNORMAL HIGH (ref 70–99)
Glucose, Bld: 90 mg/dL (ref 70–99)
Glucose, Bld: 92 mg/dL (ref 70–99)
Potassium: 3.6 mEq/L (ref 3.5–5.1)
Potassium: 3.7 mEq/L (ref 3.5–5.1)
Potassium: 3.7 mEq/L (ref 3.5–5.1)
Potassium: 4 mEq/L (ref 3.5–5.1)
Sodium: 135 mEq/L (ref 135–145)
Sodium: 137 mEq/L (ref 135–145)
Sodium: 137 mEq/L (ref 135–145)
Sodium: 138 mEq/L (ref 135–145)

## 2010-07-14 LAB — DIFFERENTIAL
Basophils Relative: 0 % (ref 0–1)
Basophils Relative: 0 % (ref 0–1)
Eosinophils Absolute: 0.1 10*3/uL (ref 0.0–0.7)
Monocytes Absolute: 1.9 10*3/uL — ABNORMAL HIGH (ref 0.1–1.0)
Monocytes Relative: 12 % (ref 3–12)
Monocytes Relative: 13 % — ABNORMAL HIGH (ref 3–12)
Neutro Abs: 10.3 10*3/uL — ABNORMAL HIGH (ref 1.7–7.7)
Neutro Abs: 11 10*3/uL — ABNORMAL HIGH (ref 1.7–7.7)
Neutrophils Relative %: 70 % (ref 43–77)

## 2010-07-14 LAB — URINALYSIS, ROUTINE W REFLEX MICROSCOPIC
Ketones, ur: 15 mg/dL — AB
Nitrite: NEGATIVE
Protein, ur: NEGATIVE mg/dL
Urobilinogen, UA: 1 mg/dL (ref 0.0–1.0)

## 2010-07-14 LAB — LEGIONELLA ANTIGEN, URINE: Legionella Antigen, Urine: NEGATIVE

## 2010-07-14 LAB — POCT CARDIAC MARKERS
CKMB, poc: 1.1 ng/mL (ref 1.0–8.0)
Myoglobin, poc: 187 ng/mL (ref 12–200)
Troponin i, poc: <0.05 ng/mL (ref 0.00–0.09)

## 2010-07-14 LAB — LIPASE, BLOOD: Lipase: 23 U/L (ref 11–59)

## 2010-07-23 MED ORDER — SIMVASTATIN 10 MG PO TABS: 10.0000 mg | ORAL_TABLET | Freq: Every day | ORAL | Status: DC

## 2010-08-10 ENCOUNTER — Other Ambulatory Visit: Payer: Self-pay | Admitting: Internal Medicine

## 2010-08-10 DIAGNOSIS — Z8719 Personal history of other diseases of the digestive system: Secondary | ICD-10-CM

## 2010-08-12 ENCOUNTER — Other Ambulatory Visit: Payer: Self-pay | Admitting: Internal Medicine

## 2010-08-12 NOTE — Telephone Encounter (Signed)
I approved 1 month only as she needs a CXR within 30 days given she missed her last follow-up appointment to assure her RLL pneumonia has resolved.  I will not approve longer than 30 days until this CXR to assure resolution is completed.

## 2010-08-12 NOTE — Telephone Encounter (Signed)
Lisa Haynes needs a CXR to follow up on a RLL pneumonia from late November 2011.  She apparently did not make an appointment scheduled for January 2012.  Please schedule Lisa Haynes into Dr. Arvilla Market next available appointment if it is within 1 month.  If Dr. Arvilla Market has no available appointments in 1 month please schedule her with any available resident within one month for the CXR.  I will only give Lisa Haynes 1 month of omeprazole.  She must come into the clinic for re-evaluation/repeat CXR before another prescription of omeprazole will be prescribed.  Thanks.

## 2010-08-25 ENCOUNTER — Encounter: Payer: Self-pay | Admitting: Internal Medicine

## 2010-08-28 ENCOUNTER — Ambulatory Visit (HOSPITAL_COMMUNITY)
Admission: RE | Admit: 2010-08-28 | Discharge: 2010-08-28 | Disposition: A | Discharge status: Home / Self Care | Payer: Medicare Other | Source: Ambulatory Visit | Attending: Internal Medicine | Admitting: Internal Medicine

## 2010-08-28 ENCOUNTER — Other Ambulatory Visit: Payer: Self-pay | Admitting: Internal Medicine

## 2010-08-28 ENCOUNTER — Encounter: Payer: Self-pay | Admitting: Internal Medicine

## 2010-08-28 ENCOUNTER — Ambulatory Visit (INDEPENDENT_AMBULATORY_CARE_PROVIDER_SITE_OTHER): Payer: Medicare Other | Admitting: Internal Medicine

## 2010-08-28 VITALS — BP 149/58 | HR 58 | Temp 96.8°F | Ht 59.0 in | Wt 249.6 lb

## 2010-08-28 DIAGNOSIS — R5381 Other malaise: Secondary | ICD-10-CM

## 2010-08-28 DIAGNOSIS — J168 Pneumonia due to other specified infectious organisms: Secondary | ICD-10-CM

## 2010-08-28 DIAGNOSIS — R059 Cough, unspecified: Secondary | ICD-10-CM | POA: Insufficient documentation

## 2010-08-28 DIAGNOSIS — J189 Pneumonia, unspecified organism: Secondary | ICD-10-CM

## 2010-08-28 DIAGNOSIS — I10 Essential (primary) hypertension: Secondary | ICD-10-CM

## 2010-08-28 DIAGNOSIS — E669 Obesity, unspecified: Secondary | ICD-10-CM

## 2010-08-28 DIAGNOSIS — D649 Anemia, unspecified: Secondary | ICD-10-CM

## 2010-08-28 DIAGNOSIS — Z09 Encounter for follow-up examination after completed treatment for conditions other than malignant neoplasm: Secondary | ICD-10-CM | POA: Insufficient documentation

## 2010-08-28 DIAGNOSIS — R609 Edema, unspecified: Secondary | ICD-10-CM

## 2010-08-28 DIAGNOSIS — M6250 Muscle wasting and atrophy, not elsewhere classified, unspecified site: Secondary | ICD-10-CM

## 2010-08-28 DIAGNOSIS — R05 Cough: Secondary | ICD-10-CM | POA: Insufficient documentation

## 2010-08-28 DIAGNOSIS — R600 Localized edema: Secondary | ICD-10-CM

## 2010-08-28 LAB — BASIC METABOLIC PANEL
BUN: 12 mg/dL (ref 6–23)
CO2: 23 mEq/L (ref 19–32)
Calcium: 9.8 mg/dL (ref 8.4–10.5)
Glucose, Bld: 92 mg/dL (ref 70–99)

## 2010-08-28 LAB — CBC WITH DIFFERENTIAL/PLATELET
Basophils Absolute: 0.1 10*3/uL (ref 0.0–0.1)
Eosinophils Absolute: 0.1 10*3/uL (ref 0.0–0.7)
Eosinophils Relative: 1 % (ref 0–5)
Lymphs Abs: 1.4 10*3/uL (ref 0.7–4.0)
MCH: 28.1 pg (ref 26.0–34.0)
MCV: 91.5 fL (ref 78.0–100.0)
Platelets: 215 10*3/uL (ref 150–400)
RDW: 14.9 % (ref 11.5–15.5)

## 2010-08-28 MED ORDER — FUROSEMIDE 40 MG PO TABS: 40.0000 mg | ORAL_TABLET | Freq: Two times a day (BID) | ORAL | Status: DC

## 2010-08-28 NOTE — Assessment & Plan Note (Signed)
Patient was advised to lose weight. I suspect that this is her biggest problem at this time. We discussed extensively about the ways to lose weight. Patient said that she was doing well until home health was coming to her house for physical therapy but started just deteriorating. Gen. deconditioning is part of the problem. We will work on her lower extremity swelling today which would help with mobility. I asked her if she can afford to come to the physical therapy here at [facility] but she does not have means of transport.

## 2010-08-28 NOTE — Progress Notes (Signed)
  Subjective:    Patient ID: Lisa Haynes, female    DOB: 1940-11-17, 70 y.o.   MRN: [id number]  HPI  Patient is 70 year old female with past medical history most significant for anxiety, depression, hypertension and morbid obesity. Patient was recently admitted to the hospital in December for pneumonia. Patient hasn't had a followup visit with her doctor since December.  Breathing is doing fine, patient requires occasional albuterol inhalers but otherwise no complaints. No shortness of breath, chest pain, dyspnea. I would get chest x-ray for followup of resolution of pneumonia.  Patient has bilateral lower extremity edema right side worse than left. Swelling is gradually progressive. No fever, chills.   Patient needs a tetanus shot today.    .Review of Systems  Constitutional: Negative for fever, activity change and appetite change.  HENT: Negative for sore throat.   Respiratory: Negative for cough and shortness of breath.   Cardiovascular: Positive for leg swelling. Negative for chest pain.  Gastrointestinal: Negative for nausea, abdominal pain, diarrhea, constipation and abdominal distention.  Genitourinary: Negative for frequency, hematuria and difficulty urinating.  Skin: Positive for color change.  Neurological: Negative for dizziness and headaches.  Psychiatric/Behavioral: Negative for suicidal ideas and behavioral problems.       Objective:   Physical Exam  Constitutional: She is oriented to person, place, and time. She appears well-developed and well-nourished.  HENT:  Head: Normocephalic and atraumatic.  Eyes: Conjunctivae and EOM are normal. Pupils are equal, round, and reactive to light. No scleral icterus.  Neck: Normal range of motion. Neck supple. No JVD present. No thyromegaly present.  Cardiovascular: Normal rate, regular rhythm, normal heart sounds and intact distal pulses.  Exam reveals no gallop and no friction rub.   No murmur heard. Pulmonary/Chest: Effort  normal and breath sounds normal. No respiratory distress. She has no wheezes. She has no rales.  Abdominal: Soft. Bowel sounds are normal. She exhibits no distension and no mass. There is no tenderness. There is no rebound and no guarding.  Musculoskeletal: Normal range of motion. She exhibits edema. She exhibits no tenderness.  Lymphadenopathy:    She has no cervical adenopathy.  Neurological: She is alert and oriented to person, place, and time.  Skin:          2+ pitting edema on both legs until knees. Skin redness noted on the right leg till knees as shown in the diagram. No warmth or open wounds noted.  Psychiatric: She has a normal mood and affect. Her behavior is normal.          Assessment & Plan:

## 2010-08-28 NOTE — Assessment & Plan Note (Signed)
I would get a chest x-ray today to assess resolution of pneumonia.

## 2010-08-28 NOTE — Assessment & Plan Note (Signed)
Patient's blood pressure is not at goal today. Patient is currently on lisinopril. I would check patient's be made today. I would also start diuretic Lasix in her given that she has bilateral lower extremity swelling. I would have her come back for followup in one week and assess potassium and creatinine at that time.

## 2010-08-28 NOTE — Patient Instructions (Addendum)
Edema Edema is an abnormal build-up of fluids in tissues. Because this is partly dependent on gravity (water flows to the lowest place), it is more common in the lower extremities (legs and thighs). It is also common in the looser tissues, like around the eyes. Painless swelling of the feet and ankles is common and increases as a person ages. It may affect both legs and may include the calves or even thighs. When squeezed, the fluid may move out of the affected area and may leave a dent for a few moments. CAUSES  Prolonged standing or sitting in one place for extended periods of time. Movement helps pump tissue fluid into the veins, and absence of movement prevents this, resulting in edema.   Varicose veins. The valves in the veins do not work as well as they should. This causes fluid to leak into the tissues.   Fluid and salt overload.   Injury, burn, or surgery to the leg, ankle, or foot, may damage veins and allow fluid to leak out.   Sunburn damages vessels. Leaky vessels allow fluid to go out into the sunburned tissues.   Allergies (from insect bites or stings, medications or chemicals) cause swelling by allowing vessels to become leaky.   Protein in the blood helps keep fluid in your vessels. Low protein, as in malnutrition, allows fluid to leak out.   Hormonal changes, including pregnancy and menstruation, cause fluid retention. This fluid may leak out of vessels and cause edema.   Medications that cause fluid retention. Examples are sex hormones, blood pressure medications, steroid treatment, or anti-depressants.   Some illnesses cause edema, especially heart failure, kidney disease, or liver disease.   Surgery that cuts veins or lymph nodes, such as surgery done for the heart or for breast cancer, may result in edema.  DIAGNOSIS Your caregiver is usually easily able to determine what is causing your swelling (edema) by simply asking what is wrong (getting a history) and examining  you (doing a physical). Sometimes x-rays, EKG (electrocardiogram or heart tracing), and blood work may be done to evaluate for underlying medical illness. TREATMENT General treatment includes:  Leg elevation (or elevation of the affected body part).   Restriction of fluid intake.   Prevention of fluid overload.   Compression of the affected body part. Compression with elastic bandages or support stockings squeezes the tissues, preventing fluid from entering and forcing it back into the blood vessels.   Diuretics (also called water pills or fluid pills) pull fluid out of your body in the form of increased urination. These are effective in reducing the swelling, but can have side effects and must be used only under your caregiver's supervision. Diuretics are appropriate only for some types of edema.  The specific treatment can be directed at any underlying causes discovered. Heart, liver, or kidney disease should be treated appropriately. HOME CARE INSTRUCTIONS  Elevate the legs (or affected body part) above the level of the heart, while lying down.   Avoid sitting or standing still for prolonged periods of time.   Avoid putting anything directly under the knees when lying down, and do not wear constricting clothing or garters on the upper legs.   Exercising the legs causes the fluid to work back into the veins and lymphatic channels. This may help the swelling go down.   The pressure applied by elastic bandages or support stockings can help reduce ankle swelling.   A low-salt diet may help reduce fluid retention and decrease the   ankle swelling.   Take any medications exactly as prescribed.  SEEK MEDICAL CARE IF:  Your edema is not responding to recommended treatments.  SEEK IMMEDIATE MEDICAL CARE IF:  You develop shortness of breath or chest pain.   You cannot breathe when you lay down; or if, while lying down, you have to get up and go to the window to get your breath.   You are  having increasing swelling without relief from treatment.   You develop a fever over 101F.   You develop pain or redness in the areas that are swollen.   Tell your caregiver right away if you have gained 3 in 1 day or 5 in a week.  MAKE SURE YOU:  Understand these instructions.   Will watch your condition.   Will get help right away if you are not doing well or get worse.  Document Released: 04/19/2005 Document Re-Released: 10/07/2009 [facility] Patient Information 2011 [place], [place].  Follow up in 1 week and check Bmet. Take your lasix as prescribed. Discussed strategies about weight loss.

## 2010-08-28 NOTE — Progress Notes (Signed)
Addended by: Lars Mage on: 08/28/2010 12:36 PM   Modules accepted: Orders

## 2010-08-29 LAB — URINALYSIS
Glucose, UA: NEGATIVE mg/dL
Hgb urine dipstick: NEGATIVE
Ketones, ur: NEGATIVE mg/dL
Nitrite: POSITIVE — AB
Protein, ur: NEGATIVE mg/dL
pH: 7.5 (ref 5.0–8.0)

## 2010-09-04 ENCOUNTER — Encounter: Payer: Self-pay | Admitting: Internal Medicine

## 2010-09-04 ENCOUNTER — Ambulatory Visit: Payer: Medicare Other | Admitting: Licensed Clinical Social Worker

## 2010-09-04 ENCOUNTER — Ambulatory Visit (INDEPENDENT_AMBULATORY_CARE_PROVIDER_SITE_OTHER): Payer: Medicare Other | Admitting: Internal Medicine

## 2010-09-04 VITALS — BP 156/81 | HR 80 | Temp 98.2°F | Ht 59.0 in | Wt 239.9 lb

## 2010-09-04 DIAGNOSIS — E039 Hypothyroidism, unspecified: Secondary | ICD-10-CM

## 2010-09-04 DIAGNOSIS — R600 Localized edema: Secondary | ICD-10-CM

## 2010-09-04 DIAGNOSIS — R5381 Other malaise: Secondary | ICD-10-CM

## 2010-09-04 DIAGNOSIS — I10 Essential (primary) hypertension: Secondary | ICD-10-CM

## 2010-09-04 DIAGNOSIS — R609 Edema, unspecified: Secondary | ICD-10-CM

## 2010-09-04 NOTE — Patient Instructions (Signed)
Please make a f/u appointment in 3-5 weeks with Dr. Arvilla Market. Please take all medications as instructed and also check for signs of dehydration- like if you feel, dizzy or about to pass out, severely increased thirst. Also please think about getting the sleep study and discuss with Dr. Arvilla Market during next visit.

## 2010-09-04 NOTE — Assessment & Plan Note (Signed)
Last TSH and T4 were normal in November 2011. Complaint 2 Synthroid 25 mcg daily.

## 2010-09-04 NOTE — Progress Notes (Signed)
Soc. Work.  15 minutes.  Referred by Dr. Cathey Endow for assessment of needs/Seeing Dr. Allena Katz today for follow-up.  Met with patient and niece, Lisa Haynes who provided transportation today.   Patient reported that [place] has Medicare and Medicaid [place]     Her main issues today is that she would like to starting walking again outside and also working in her garden.   The niece and brother are active in her care and also help her get groceries and medications.  Their phone number is [phone] and they live at [facility]  [place]  [id number].    Has three sons:  One in [place], one in [place], and one in [place].   Will be celebrating Mother's Day with son who is local.    Social situation:  Has some anxieties relating to groups and crowds.  Not interested in senior programs or social activities. Interested in getting back with her local church and said that they have a bus for pick-up.    Plan:  Lisa Haynes is already working on home PT due to deconditioning.  Encouraged patient to make progress with PT and possibly get reconnected with her church for spiritual and social connection.    Patient and niece have my card for other questions and concerns.

## 2010-09-04 NOTE — Assessment & Plan Note (Signed)
Blood pressure well controlled her on the current regimen.

## 2010-09-04 NOTE — Progress Notes (Signed)
  Subjective:    Patient ID: Lisa Haynes, female    DOB: January 26, 1941, 70 y.o.   MRN: [id number]  HPI Ms. Auvil is a pleasant 70 year old woman with a history of hypothyroidism, hyperlipidemia, chronic leg swelling the clinic for followup visit. She was seen by Dr. Eben Burow on April 27,12 and was started on Lasix 40 mg twice a day for bilateral -2+ pitting edema. She responded very well to the medication and lost about 10 pounds of weight since last visit and the swelling has decreased significantly. She feels better and seems to comply with all medications. Denied any fever chills, shortness of breath, headache, chest pain, cough, abdominal pain, diarrhea, urinary abnormality.    Review of Systems    as per history of present illness Objective:   Physical Exam    Constitutional: Vital signs reviewed.  Patient is morbidly obese in no acute distress and cooperative with exam. Alert and oriented x3.  Head: Normocephalic and atraumatic Ear: TM normal bilaterally Mouth: no erythema or exudates, MMM Eyes: PERRL, EOMI, conjunctivae normal, No scleral icterus.  Neck: Supple, Trachea midline normal ROM, No JVD, mass, thyromegaly, or carotid bruit present.  Cardiovascular: RRR, S1 normal, S2 normal, no MRG, pulses symmetric and intact bilaterally Pulmonary/Chest: CTAB, no wheezes, rales, or rhonchi Abdominal: Soft. Non-tender, non-distended, bowel sounds are normal, no masses, organomegaly, or guarding present.  GU: no CVA tenderness Musculoskeletal: No joint deformities, erythema, or stiffness. 1+ pedal edema B/L upto knees. Neurological: A&O x3, Strenght is normal and symmetric bilaterally, cranial nerve II-XII are grossly intact, no focal motor deficit, sensory intact to light touch bilaterally.  Skin: Warm, dry and intact. No rash, cyanosis, or clubbing.  Psychiatric: Normal mood and affect. speech and behavior is normal. Judgment and thought content normal. Cognition and memory are normal.        Assessment & Plan:

## 2010-09-04 NOTE — Assessment & Plan Note (Addendum)
Unknown etiology. Thyroid is replaced, no significant CHF history of signs of left heart failure,   no abnormalities of liver functions, no chronic renal insufficiency. Likely due to obesity as per Dr. Sumner Boast diagnoses , but might have obstructive sleep apnea per [name]. Peak pulmonary artery pressure the last echo November 2011 was 35 MM of mercury . Discussed about having a sleep study as she used to snore before but denies now and also sleeps for about 12-14 hours and still feels tired after waking up. She doesn't want to do it right now but will consider doing it and discussing it more detailed in the next visit with Dr. Arvilla Market .  [name] explained in detail about the use of the sleep study in her case.  Responding well to Lasix. We'll continue the same dose for now and check BMP today. Also explained about her dehydration and to keep up with fluid losses by drinking enough water as needed.

## 2010-09-05 LAB — BASIC METABOLIC PANEL WITH GFR
BUN: 22 mg/dL (ref 6–23)
CO2: 29 mEq/L (ref 19–32)
Calcium: 9.7 mg/dL (ref 8.4–10.5)
Chloride: 98 mEq/L (ref 96–112)
Creat: 1.01 mg/dL (ref 0.40–1.20)

## 2010-09-15 NOTE — H&P (Signed)
NAMELARRIE, Lisa Haynes NO.:  [id number]   MEDICAL RECORD NO.:  [id number]          PATIENT TYPE:  EMS   LOCATION:  ED                           FACILITY:  [facility]   PHYSICIAN:  Ladell Pier, M.D.   DATE OF BIRTH:  [birth year]   DATE OF ADMISSION:  01/05/2007  DATE OF DISCHARGE:                              HISTORY & PHYSICAL   CHIEF COMPLAINT:  Abdominal pain and diarrhea.   HISTORY OF PRESENT ILLNESS:  The patient is a 70 year old white female  with past medical history significant for hypertension, GERD and  dyslipidemia.  The patient presented to the ED with intermittent  diarrhea for 48 weeks with abdominal cramping.  She has had no fevers or  chills.  No nausea or vomiting.  She saw a dermatologist about a month  ago and was prescribed antibiotics, but she states that she has had the  diarrhea prior to taking the antibiotic.  She complains that she has had  diarrhea on and on for many years, based on her diet.  But it has just  gotten worse lately.  She had a colonoscopy less than a year ago, and  she said that it was an incomplete prep, but dissection that were seen  were normal.  She is due to have a follow-up colonoscopy.   PAST MEDICAL HISTORY:  1. Dyslipidemia.  2. Hypertension.  3. GERD.  4. Anxiety.  5. Depression.  6  Chronic lower back pain.  1. Prolapse bladder.  2. Chronic headaches.   PAST SURGICAL HISTORY:  Total abdominal hysterectomy, status post  appendectomy.   FAMILY HISTORY:  Mother died at 53 from a heart attack.  Father died at  51 from kidney cancer.   SOCIAL HISTORY:  No tobacco or alcohol use.  She is retired.  She has  three sons.   MEDICATIONS:  1. Atenolol 25 mg daily.  2. Simvastatin 10 mg q.h.s.  3. Ultram 50 mg twice daily.  4. Omeprazole 40 mg daily.  5. Amitriptyline 50 mg q.h.s.  6. Lasix 40 mg daily.  7. Cyclobenzaprine 10 mg twice daily.  8. Paxil 40 mg daily.  9. Calcium daily.   ALLERGIES:  No known  drug allergies.   REVIEW OF SYSTEMS:  As stated in the HPI.   PHYSICAL EXAMINATION:  VITAL SIGNS:  Temperature 97.3, blood pressure  135/79, pulse 97, respirations 20, pulse oximetry 97% on room air.  HEENT:  Head is normocephalic, atraumatic.  Pupils equal and reactive to  light.  Throat no erythema.  CARDIOVASCULAR:  Regular rate and rhythm.  LUNGS:  Clear bilaterally.  ABDOMEN:  Positive bowel sounds.  The right middle area is a little  tender to palpation.  No guarding or rebound.  EXTREMITIES: 1+ edema, 2+ DP pulse, bilaterally.   LABORATORY DATA:  WBC 19.8, hemoglobin 14.9, platelet 264, MCV 87.7.  Sodium 139, potassium 3.8, chloride 102, CO2 25, BUN 15, creatinine  1.06, glucose 125.  KUB negative.  Lipase 20.  UA normal.  LFTs are  normal.   ASSESSMENT/PLAN:  1. Diarrhea, acute and chronic: Will  give her IV fluids.  Will start      her on Cipro IV.  Will do stools for C diff toxin, O&P and WBC.      Will also do check for celiac disease with IgE due to      transglutaminase.  This is likely secondary to she complains of      some skin rash problem that she has been having with the      intermittent diarrhea.  2. Hypertension: Will continue all her medications.  Will hold the      Lasix secondary to her being on fluids for the diarrhea.  3. Diarrhea: She had a CT scan done in the ED just now, and the      results are still pending.  4. Gastroesophageal reflux disease: Will continue her on PPI.  Will      put her on Protonix.  Bear in mind that this can also cause      diarrhea.  5. Back pain: Will give her Tylenol and oxycodone as needed and      continue her on Flexeril p.r.n.  6. Dyslipidemia: Continue her on simvastatin.      Ladell Pier, M.D.  Electronically Signed     N[place]/MEDQ  D:  01/05/2007  T:  01/05/2007  Job:  [id number]

## 2010-10-06 ENCOUNTER — Encounter: Payer: Medicare Other | Admitting: Internal Medicine

## 2010-10-09 ENCOUNTER — Other Ambulatory Visit (INDEPENDENT_AMBULATORY_CARE_PROVIDER_SITE_OTHER): Payer: Medicare Other | Admitting: *Deleted

## 2010-10-09 DIAGNOSIS — F329 Major depressive disorder, single episode, unspecified: Secondary | ICD-10-CM

## 2010-10-09 DIAGNOSIS — F064 Anxiety disorder due to known physiological condition: Secondary | ICD-10-CM

## 2010-10-09 DIAGNOSIS — I10 Essential (primary) hypertension: Secondary | ICD-10-CM

## 2010-10-09 MED ORDER — LISINOPRIL 10 MG PO TABS: 10.0000 mg | ORAL_TABLET | Freq: Every day | ORAL | Status: DC

## 2010-10-09 MED ORDER — CITALOPRAM HYDROBROMIDE 40 MG PO TABS: 40.0000 mg | ORAL_TABLET | Freq: Every day | ORAL | Status: DC

## 2010-10-12 ENCOUNTER — Encounter: Payer: Self-pay | Admitting: Internal Medicine

## 2010-10-15 ENCOUNTER — Ambulatory Visit (INDEPENDENT_AMBULATORY_CARE_PROVIDER_SITE_OTHER): Payer: Medicare Other | Admitting: Internal Medicine

## 2010-10-15 ENCOUNTER — Encounter: Payer: Self-pay | Admitting: Internal Medicine

## 2010-10-15 DIAGNOSIS — T2122XA Burn of second degree of abdominal wall, initial encounter: Secondary | ICD-10-CM

## 2010-10-15 DIAGNOSIS — R600 Localized edema: Secondary | ICD-10-CM

## 2010-10-15 DIAGNOSIS — R609 Edema, unspecified: Secondary | ICD-10-CM

## 2010-10-15 DIAGNOSIS — N3946 Mixed incontinence: Secondary | ICD-10-CM

## 2010-10-15 DIAGNOSIS — I10 Essential (primary) hypertension: Secondary | ICD-10-CM

## 2010-10-15 DIAGNOSIS — Z0000 Encounter for general adult medical examination without abnormal findings: Secondary | ICD-10-CM

## 2010-10-15 MED ORDER — SILVER SULFADIAZINE 1 % EX CREA: TOPICAL_CREAM | CUTANEOUS | Status: DC

## 2010-10-15 MED ORDER — TETANUS-DIPHTH-ACELL PERTUSSIS 5-2.5-18.5 LF-MCG/0.5 IM SUSP: 0.5000 mL | Freq: Once | INTRAMUSCULAR | Status: DC

## 2010-10-15 MED ORDER — FUROSEMIDE 40 MG PO TABS: 40.0000 mg | ORAL_TABLET | Freq: Every day | ORAL | Status: DC

## 2010-10-15 MED ORDER — TOLTERODINE TARTRATE ER 2 MG PO CP24: 2.0000 mg | ORAL_CAPSULE | Freq: Every day | ORAL | Status: DC

## 2010-10-15 MED ORDER — DOXYCYCLINE HYCLATE 100 MG PO TABS: 100.0000 mg | ORAL_TABLET | Freq: Two times a day (BID) | ORAL | Status: AC

## 2010-10-15 NOTE — Progress Notes (Signed)
Addended by: Angelina Ok F on: 10/15/2010 04:13 PM   Modules accepted: Orders
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
## 2010-10-15 NOTE — Assessment & Plan Note (Addendum)
Patient's lower extremity edema is improved with Lasix.  She is very concerned that the Lasix is aggravating her long-standing urinary incontinence. Like to try reducing her dose of Lasix. I feel this is reasonable and will decrease her Lasix dose from 40 mg twice a day to 40 mg daily. This may result in increased edema and adversely impact her blood pressure control. If this occurs we will need to resume her increased dose. Patient is aware of this and agrees with plan. Will check a basic metabolic panel at her followup appointment in one week and assess for progressive edema at that time.
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
## 2010-10-15 NOTE — Assessment & Plan Note (Signed)
Patient reports features of both stress and urge incontinence.  Her symptoms are exacerbated following increase of her Lasix dose.  She is very troubled by this and is interested in therapeutic options for her incontinence.  We reviewed behavioral modification techniques including frequent urination every 3 hours as well as decreased fluid intake after 5 PM to help decrease the number of episodes of incontinence.  We will also start her on long-acting Detrol 2 mg daily. At her next office visit we'll reassess her symptoms for improvement of Detrol. If she's been experiencing improvement will consider increasing her to 4 mg of Detrol daily We'll also decrease her Lasix dose to 40 mg daily.

## 2010-10-19 ENCOUNTER — Encounter: Payer: Self-pay | Admitting: Internal Medicine

## 2010-10-19 ENCOUNTER — Ambulatory Visit (INDEPENDENT_AMBULATORY_CARE_PROVIDER_SITE_OTHER): Payer: Medicare Other | Admitting: Internal Medicine

## 2010-10-19 DIAGNOSIS — E039 Hypothyroidism, unspecified: Secondary | ICD-10-CM

## 2010-10-19 DIAGNOSIS — N3946 Mixed incontinence: Secondary | ICD-10-CM

## 2010-10-19 DIAGNOSIS — R609 Edema, unspecified: Secondary | ICD-10-CM

## 2010-10-19 DIAGNOSIS — T2122XA Burn of second degree of abdominal wall, initial encounter: Secondary | ICD-10-CM

## 2010-10-19 DIAGNOSIS — E785 Hyperlipidemia, unspecified: Secondary | ICD-10-CM

## 2010-10-19 DIAGNOSIS — R600 Localized edema: Secondary | ICD-10-CM

## 2010-10-19 DIAGNOSIS — I10 Essential (primary) hypertension: Secondary | ICD-10-CM

## 2010-10-19 LAB — COMPREHENSIVE METABOLIC PANEL
Albumin: 3.9 g/dL (ref 3.5–5.2)
BUN: 19 mg/dL (ref 6–23)
CO2: 28 mEq/L (ref 19–32)
Calcium: 9.4 mg/dL (ref 8.4–10.5)
Chloride: 103 mEq/L (ref 96–112)
Creat: 0.96 mg/dL (ref 0.50–1.10)
Glucose, Bld: 92 mg/dL (ref 70–99)
Potassium: 4.7 mEq/L (ref 3.5–5.3)

## 2010-10-19 LAB — LIPID PANEL
LDL Cholesterol: 65 mg/dL (ref 0–99)
Triglycerides: 79 mg/dL (ref <150)
VLDL: 16 mg/dL (ref 0–40)

## 2010-10-19 LAB — TSH: TSH: 3.562 u[IU]/mL (ref 0.350–4.500)

## 2010-10-19 MED ORDER — FUROSEMIDE 40 MG PO TABS: 60.0000 mg | ORAL_TABLET | Freq: Every day | ORAL | Status: DC

## 2010-10-19 NOTE — Assessment & Plan Note (Addendum)
She is tolerating her abx well; denies nausea, abdominal pain, or diarrhea.  She feels the wound is getting better but notes it continues to bleed with gentle debridement.  She denies fevers or shaking chills.  She is advised to DC  the silver sulfadiazine cream and continue with daily gentle debridement with soap and water and to apply a topical triple antibiotic cream.  She is advised to return to the clinic if she notes any worsening symptoms including increased erythema, swelling, purulent or bloody drainage, fevers, shaking chills, nausea, vomiting, diarrhea, or other concerning complaint.

## 2010-10-19 NOTE — Assessment & Plan Note (Addendum)
She has not noticed any change in her symptoms after starting Detrol.  She is trying to pee every 2-3 hours and this seems to improve her symptoms overall.  She denies any side effects from the Detrol.  Encouraged continued behavioral modifications discussed at her last visit including regular urination, avoidance of fluid intake after 6 PM, etc.  She is not interested in increasing her Detrol at this time that if her symptoms remain unimproved at her next visit we will revisit this issue.

## 2010-10-19 NOTE — Patient Instructions (Signed)
Schedule a followup appointment in 3-4 months with Dr. Arvilla Market. Take 1 and 1/2 tablets of Lasix every morning. I will call you if any of your lab work is not normal. Keep taking all of her other medications as directed Stop using the silver sulfadiazine cream on your burn Use the topical bacitracin instead; you may use over-the-counter triple antibiotic treatment if you run out. Keep gently cleaning the wound with soap and water every day and keep it covered with the ointment and gauze. If the wound worsens or you develop any fever or shaking chills call the clinic at [phone]. You may call the clinic with any questions or concerns at any time at [phone].

## 2010-11-15 ENCOUNTER — Other Ambulatory Visit: Payer: Self-pay | Admitting: Internal Medicine

## 2010-11-19 ENCOUNTER — Other Ambulatory Visit: Payer: Self-pay | Admitting: Internal Medicine

## 2010-11-19 NOTE — Assessment & Plan Note (Signed)
Patient is tolerating her statin well without adverse effect. She is currently taking Zocor 10 mg daily.  She is fasting today; will check a fasting lipid panel as well as liver function tests.

## 2010-11-19 NOTE — Assessment & Plan Note (Signed)
Patient continues to experience edema in her bilateral lower extremities and is interested in possible increased her Lasix.  She does not have any JVD, shortness of breath, crackles on exam, or other signs to suggest pulmonary edema/volume overload. Will increase her to 60 mg twice a day. Will check a comprehensive metabolic panel today.
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
## 2010-11-19 NOTE — Assessment & Plan Note (Signed)
Patient is tolerating her current dose of Synthroid well. She does not have any symptoms of hypo-or hyperthyroidism.  Will check a TSH today.

## 2010-12-18 ENCOUNTER — Other Ambulatory Visit: Payer: Self-pay | Admitting: Internal Medicine

## 2011-01-20 ENCOUNTER — Other Ambulatory Visit: Payer: Self-pay | Admitting: Internal Medicine

## 2011-01-29 LAB — DIFFERENTIAL
Basophils Absolute: 0
Basophils Relative: 0
Eosinophils Absolute: 0.1
Lymphs Abs: 3
Neutrophils Relative %: 60

## 2011-01-29 LAB — URINALYSIS, ROUTINE W REFLEX MICROSCOPIC
Nitrite: NEGATIVE
Protein, ur: NEGATIVE
Specific Gravity, Urine: 1.011
Urobilinogen, UA: 0.2

## 2011-01-29 LAB — CBC
MCV: 91.6
Platelets: 228
RDW: 14.6
WBC: 9.8

## 2011-01-29 LAB — URINE MICROSCOPIC-ADD ON

## 2011-01-29 LAB — POCT I-STAT, CHEM 8
Creatinine, Ser: 1
HCT: 44
Hemoglobin: 15
Potassium: 4.6
Sodium: 141
TCO2: 29

## 2011-02-12 LAB — DIFFERENTIAL
Basophils Absolute: 0.1
Eosinophils Relative: 0
Lymphocytes Relative: 13
Lymphs Abs: 2.5
Monocytes Absolute: 0.8 — ABNORMAL HIGH
Neutro Abs: 16.3 — ABNORMAL HIGH

## 2011-02-12 LAB — URINALYSIS, ROUTINE W REFLEX MICROSCOPIC
Glucose, UA: NEGATIVE
Hgb urine dipstick: NEGATIVE
Specific Gravity, Urine: 1.03
Urobilinogen, UA: 1

## 2011-02-12 LAB — BASIC METABOLIC PANEL
Calcium: 8.5
Calcium: 9.8
Creatinine, Ser: 1
GFR calc Af Amer: >60
GFR calc Af Amer: >60
GFR calc non Af Amer: 52 — ABNORMAL LOW
GFR calc non Af Amer: 55 — ABNORMAL LOW
Potassium: 3.8
Sodium: 138
Sodium: 139

## 2011-02-12 LAB — CBC
HCT: 35 — ABNORMAL LOW
HCT: 44.2
Hemoglobin: 11.6 — ABNORMAL LOW
Hemoglobin: 12
Hemoglobin: 14.9
MCHC: 33.6
MCV: 87.7
RBC: 4
RBC: 4.08
RBC: 5.04
WBC: 19.8 — ABNORMAL HIGH
WBC: 7.8
WBC: 9.1

## 2011-02-12 LAB — HEPATIC FUNCTION PANEL
ALT: 15
AST: 28
Alkaline Phosphatase: 101
Bilirubin, Direct: 0.2
Indirect Bilirubin: 0.8

## 2011-02-12 LAB — COMPREHENSIVE METABOLIC PANEL
AST: 19
CO2: 26
Chloride: 108
Creatinine, Ser: 0.94
GFR calc Af Amer: >60
GFR calc non Af Amer: 59 — ABNORMAL LOW
Glucose, Bld: 92
Total Bilirubin: 1

## 2011-02-12 LAB — URINE CULTURE: Culture: NO GROWTH

## 2011-02-12 LAB — CULTURE, BLOOD (ROUTINE X 2): Culture: NO GROWTH

## 2011-02-12 LAB — TISSUE TRANSGLUTAMINASE, IGA: Tissue Transglutaminase Ab, IgA: 0.7 U/mL (ref <7)

## 2011-03-28 ENCOUNTER — Other Ambulatory Visit: Payer: Self-pay | Admitting: Internal Medicine

## 2011-04-28 ENCOUNTER — Emergency Department (HOSPITAL_COMMUNITY)
Admission: EM | Admit: 2011-04-28 | Discharge: 2011-04-28 | Disposition: A | Discharge status: Home / Self Care | Payer: Medicare Other | Attending: Emergency Medicine | Admitting: Emergency Medicine

## 2011-04-28 ENCOUNTER — Emergency Department (HOSPITAL_COMMUNITY): Payer: Medicare Other

## 2011-04-28 ENCOUNTER — Encounter (HOSPITAL_COMMUNITY): Payer: Self-pay | Admitting: Emergency Medicine

## 2011-04-28 DIAGNOSIS — R197 Diarrhea, unspecified: Secondary | ICD-10-CM | POA: Insufficient documentation

## 2011-04-28 DIAGNOSIS — K922 Gastrointestinal hemorrhage, unspecified: Secondary | ICD-10-CM | POA: Insufficient documentation

## 2011-04-28 DIAGNOSIS — R112 Nausea with vomiting, unspecified: Secondary | ICD-10-CM | POA: Insufficient documentation

## 2011-04-28 DIAGNOSIS — R1084 Generalized abdominal pain: Secondary | ICD-10-CM | POA: Insufficient documentation

## 2011-04-28 DIAGNOSIS — N390 Urinary tract infection, site not specified: Secondary | ICD-10-CM | POA: Insufficient documentation

## 2011-04-28 DIAGNOSIS — K529 Noninfective gastroenteritis and colitis, unspecified: Secondary | ICD-10-CM

## 2011-04-28 DIAGNOSIS — K5289 Other specified noninfective gastroenteritis and colitis: Secondary | ICD-10-CM | POA: Insufficient documentation

## 2011-04-28 DIAGNOSIS — Z79899 Other long term (current) drug therapy: Secondary | ICD-10-CM | POA: Insufficient documentation

## 2011-04-28 LAB — DIFFERENTIAL
Basophils Absolute: 0 10*3/uL (ref 0.0–0.1)
Basophils Relative: 0 % (ref 0–1)
Monocytes Relative: 7 % (ref 3–12)
Neutro Abs: 14.3 10*3/uL — ABNORMAL HIGH (ref 1.7–7.7)
Neutrophils Relative %: 82 % — ABNORMAL HIGH (ref 43–77)

## 2011-04-28 LAB — URINALYSIS, ROUTINE W REFLEX MICROSCOPIC
Nitrite: POSITIVE — AB
Specific Gravity, Urine: 1.029 (ref 1.005–1.030)
Urobilinogen, UA: 0.2 mg/dL (ref 0.0–1.0)

## 2011-04-28 LAB — URINE MICROSCOPIC-ADD ON

## 2011-04-28 LAB — COMPREHENSIVE METABOLIC PANEL
ALT: 10 U/L (ref 0–35)
AST: 14 U/L (ref 0–37)
CO2: 25 mEq/L (ref 19–32)
Calcium: 9.7 mg/dL (ref 8.4–10.5)
Chloride: 105 mEq/L (ref 96–112)
GFR calc non Af Amer: 56 mL/min — ABNORMAL LOW (ref >90)
Sodium: 140 mEq/L (ref 135–145)
Total Bilirubin: 0.4 mg/dL (ref 0.3–1.2)

## 2011-04-28 LAB — CBC
Hemoglobin: 13.5 g/dL (ref 12.0–15.0)
MCHC: 31.9 g/dL (ref 30.0–36.0)
RDW: 14.6 % (ref 11.5–15.5)
WBC: 17.4 10*3/uL — ABNORMAL HIGH (ref 4.0–10.5)

## 2011-04-28 MED ORDER — DEXTROSE 5 % IV SOLN
1.0000 g | Freq: Once | INTRAVENOUS | Status: DC
  Filled 2011-04-28 (×2): qty 10

## 2011-04-28 MED ORDER — MORPHINE SULFATE 4 MG/ML IJ SOLN
4.0000 mg | Freq: Once | INTRAMUSCULAR | Status: AC
  Administered 2011-04-28: 4 mg via INTRAVENOUS
  Filled 2011-04-28: qty 1

## 2011-04-28 MED ORDER — SODIUM CHLORIDE 0.9 % IV BOLUS (SEPSIS)
500.0000 mL | Freq: Once | INTRAVENOUS | Status: AC
  Administered 2011-04-28: 500 mL via INTRAVENOUS

## 2011-04-28 MED ORDER — HYDROCODONE-ACETAMINOPHEN 5-500 MG PO TABS: 1.0000 | ORAL_TABLET | Freq: Four times a day (QID) | ORAL | Status: AC | PRN

## 2011-04-28 MED ORDER — IOHEXOL 300 MG/ML  SOLN
100.0000 mL | Freq: Once | INTRAMUSCULAR | Status: AC | PRN
  Administered 2011-04-28: 100 mL via INTRAVENOUS

## 2011-04-28 MED ORDER — ONDANSETRON HCL 4 MG/2ML IJ SOLN
INTRAMUSCULAR | Status: AC
  Administered 2011-04-28: 05:00:00
  Filled 2011-04-28: qty 2

## 2011-04-28 MED ORDER — ONDANSETRON HCL 4 MG/2ML IJ SOLN
4.0000 mg | Freq: Once | INTRAMUSCULAR | Status: AC
  Administered 2011-04-28: 4 mg via INTRAVENOUS
  Filled 2011-04-28: qty 2

## 2011-04-28 MED ORDER — IOHEXOL 300 MG/ML  SOLN
20.0000 mL | INTRAMUSCULAR | Status: AC
  Administered 2011-04-28: 20 mL via ORAL

## 2011-04-28 MED ORDER — CIPROFLOXACIN HCL 500 MG PO TABS: 500.0000 mg | ORAL_TABLET | Freq: Two times a day (BID) | ORAL | Status: AC

## 2011-04-28 NOTE — ED Provider Notes (Signed)
History     CSN: [id number]  Arrival date & time 04/28/11  4098   First MD Initiated Contact with Patient 04/28/11 [phone]      Chief Complaint  Patient presents with  . Nausea    (Consider location/radiation/quality/duration/timing/severity/associated sxs/prior treatment) HPI Comments: Patient presents complaining of abdominal pain, nausea, vomiting and diarrhea since approximately 1 AM.  It is nonbloody non-coffee ground emesis and nonbloody non-melenic stool.  Patient notes it is just loose watery stools.  Her abdominal pain is just generalized in nature.  No fevers.  No cough or chest pain or shortness of breath.  Patient notes a history of UTIs but denies any dysuria today.  She's had a hysterectomy and an appendectomy but denies any other abdominal surgeries.  Patient is a 70 y.o. female presenting with abdominal pain. The history is provided by the patient. No language interpreter was used.  Abdominal Pain The primary symptoms of the illness include abdominal pain, nausea, vomiting, diarrhea and hematochezia. The primary symptoms of the illness do not include fever, fatigue, shortness of breath, hematemesis, dysuria, vaginal discharge or vaginal bleeding. The current episode started 3 to 5 hours ago. The onset of the illness was sudden.  Symptoms associated with the illness do not include chills or back pain.    Past Medical History  Diagnosis Date  . Bilateral knee pain   . Depression   . Hypertension   . Low back pain   . Osteoarthritis   . Osteoporosis     Past Surgical History  Procedure Date  . Abdominal hysterectomy     with unilateral oophorectomy    History reviewed. No pertinent family history.  History  Substance Use Topics  . Smoking status: Never Smoker   . Smokeless tobacco: Not on file  . Alcohol Use: No    OB History    Grav Para Term Preterm Abortions TAB SAB Ect Mult Living                  Review of Systems  Constitutional: Negative.   Negative for fever, chills and fatigue.  HENT: Negative.   Eyes: Negative.  Negative for discharge and redness.  Respiratory: Negative.  Negative for cough and shortness of breath.   Cardiovascular: Negative.  Negative for chest pain.  Gastrointestinal: Positive for nausea, vomiting, abdominal pain, diarrhea and hematochezia. Negative for hematemesis.  Genitourinary: Negative.  Negative for dysuria, vaginal bleeding and vaginal discharge.  Musculoskeletal: Negative.  Negative for back pain.  Skin: Negative.  Negative for color change and rash.  Neurological: Negative.  Negative for syncope and headaches.  Hematological: Negative.  Negative for adenopathy.  Psychiatric/Behavioral: Negative.  Negative for confusion.  All other systems reviewed and are negative.    Allergies  Review of patient's allergies indicates no known allergies.  Home Medications   Current Outpatient Rx  Name Route Sig Dispense Refill  . CALCARB 600/D PO Oral Take by mouth 3 (three) times daily.      Marland Kitchen CITALOPRAM HYDROBROMIDE 40 MG PO TABS Oral Take 1 tablet (40 mg total) by mouth daily. 90 tablet 2  . DETROL LA 2 MG PO CP24  TAKE ONE CAPSULE BY MOUTH DAILY 30 capsule 3  . FUROSEMIDE 40 MG PO TABS  TAKE 1 1/2 TABLETS BY MOUTH DAILY 180 tablet 0  . LEVOTHYROXINE SODIUM 25 MCG PO TABS Oral Take 25 mcg by mouth daily.      Marland Kitchen LISINOPRIL 10 MG PO TABS Oral Take 1 tablet (10  mg total) by mouth daily. 90 tablet 2  . MUPIROCIN 2 % EX OINT Topical Apply topically 2 (two) times daily.      Marland Kitchen OMEPRAZOLE 20 MG PO CPDR  TAKE 2 CAPSULES BY MOUTH DAILY 60 capsule 11  . SILVER SULFADIAZINE 1 % EX CREA  Apply to affected area once daily 85 g 1  . SIMVASTATIN 10 MG PO TABS Oral Take 1 tablet (10 mg total) by mouth at bedtime. 90 tablet 4  . TRAMADOL HCL 50 MG PO TABS Oral Take 50 mg by mouth every 6 (six) hours as needed.      . TRIAMCINOLONE ACETONIDE 0.1 % EX CREA Topical Apply topically 3 (three) times daily. Please do not put  on open cuts or your face       BP 134/67  Pulse 51  Temp(Src) 97.8 F (36.6 C) (Oral)  Resp 20  SpO2 96%  Physical Exam  Constitutional: She is oriented to person, place, and time. She appears well-developed and well-nourished.  Non-toxic appearance. She does not have a sickly appearance.  HENT:  Head: Normocephalic and atraumatic.  Eyes: Conjunctivae, EOM and lids are normal. Pupils are equal, round, and reactive to light. No scleral icterus.  Neck: Trachea normal and normal range of motion. Neck supple.  Cardiovascular: Normal rate, regular rhythm and normal heart sounds.   Pulmonary/Chest: Effort normal and breath sounds normal. No respiratory distress. She has no wheezes. She has no rales.  Abdominal: Soft. Normal appearance. There is no tenderness. There is no rebound, no guarding and no CVA tenderness.       Obese abdomen with no focal areas of tenderness, rebound, guarding  Musculoskeletal: Normal range of motion.  Neurological: She is alert and oriented to person, place, and time. She has normal strength.  Skin: Skin is warm, dry and intact. No rash noted.  Psychiatric: She has a normal mood and affect. Her behavior is normal. Judgment and thought content normal.    ED Course  Procedures (including critical care time)  Results for orders placed during the hospital encounter of 04/28/11  CBC      Component Value Range   WBC 17.4 (*) 4.0 - 10.5 (K/uL)   RBC 4.61  3.87 - 5.11 (MIL/uL)   Hemoglobin 13.5  12.0 - 15.0 (g/dL)   HCT 40.9  81.1 - 91.4 (%)   MCV 91.8  78.0 - 100.0 (fL)   MCH 29.3  26.0 - 34.0 (pg)   MCHC 31.9  30.0 - 36.0 (g/dL)   RDW 78.2  95.6 - 21.3 (%)   Platelets 209  150 - 400 (K/uL)  DIFFERENTIAL      Component Value Range   Neutrophils Relative 82 (*) 43 - 77 (%)   Neutro Abs 14.3 (*) 1.7 - 7.7 (K/uL)   Lymphocytes Relative 10 (*) 12 - 46 (%)   Lymphs Abs 1.8  0.7 - 4.0 (K/uL)   Monocytes Relative 7  3 - 12 (%)   Monocytes Absolute 1.3 (*) 0.1 -  1.0 (K/uL)   Eosinophils Relative 0  0 - 5 (%)   Eosinophils Absolute 0.0  0.0 - 0.7 (K/uL)   Basophils Relative 0  0 - 1 (%)   Basophils Absolute 0.0  0.0 - 0.1 (K/uL)  URINALYSIS, ROUTINE W REFLEX MICROSCOPIC      Component Value Range   Color, Urine AMBER (*) YELLOW    APPearance TURBID (*) CLEAR    Specific Gravity, Urine 1.029  1.005 - 1.030  pH 5.0  5.0 - 8.0    Glucose, UA NEGATIVE  NEGATIVE (mg/dL)   Hgb urine dipstick MODERATE (*) NEGATIVE    Bilirubin Urine SMALL (*) NEGATIVE    Ketones, ur 15 (*) NEGATIVE (mg/dL)   Protein, ur NEGATIVE  NEGATIVE (mg/dL)   Urobilinogen, UA 0.2  0.0 - 1.0 (mg/dL)   Nitrite POSITIVE (*) NEGATIVE    Leukocytes, UA LARGE (*) NEGATIVE   COMPREHENSIVE METABOLIC PANEL      Component Value Range   Sodium 140  135 - 145 (mEq/L)   Potassium 3.8  3.5 - 5.1 (mEq/L)   Chloride 105  96 - 112 (mEq/L)   CO2 25  19 - 32 (mEq/L)   Glucose, Bld 120 (*) 70 - 99 (mg/dL)   BUN 13  6 - 23 (mg/dL)   Creatinine, Ser 1.61  0.50 - 1.10 (mg/dL)   Calcium 9.7  8.4 - 09.6 (mg/dL)   Total Protein 7.2  6.0 - 8.3 (g/dL)   Albumin 3.5  3.5 - 5.2 (g/dL)   AST 14  0 - 37 (U/L)   ALT 10  0 - 35 (U/L)   Alkaline Phosphatase 80  39 - 117 (U/L)   Total Bilirubin 0.4  0.3 - 1.2 (mg/dL)   GFR calc non Af Amer 56 (*) >90 (mL/min)   GFR calc Af Amer 65 (*) >90 (mL/min)  LIPASE, BLOOD      Component Value Range   Lipase 41  11 - 59 (U/L)  URINE MICROSCOPIC-ADD ON      Component Value Range   Squamous Epithelial / LPF FEW (*) RARE    WBC, UA TOO NUMEROUS TO COUNT  <3 (WBC/hpf)   RBC / HPF 7-10  <3 (RBC/hpf)   Bacteria, UA FEW (*) RARE    Casts HYALINE CASTS (*) NEGATIVE    Crystals CA OXALATE CRYSTALS (*) NEGATIVE    Urine-Other MUCOUS PRESENT     Dg Abd Acute W/chest  04/28/2011  *RADIOLOGY REPORT*  Clinical Data: Mid abdominal pain, nausea, vomiting and diarrhea.  ACUTE ABDOMEN SERIES (ABDOMEN 2 VIEW & CHEST 1 VIEW)  Comparison: Chest radiograph performed  08/28/2010, and chest and abdominal radiographs performed 01/05/2007; CT of the abdomen and pelvis performed 03/23/2010  Findings: The lungs are well-aerated.  Minimal left basilar opacity likely reflects atelectasis.  There is no evidence of pleural effusion or pneumothorax.  Pulmonary vascularity is at the upper limits of normal.  The cardiomediastinal silhouette is borderline normal in size.  The visualized bowel gas pattern is unremarkable.  Scattered fluid and air are noted within small and large bowel loops; there is no evidence of small bowel dilatation to suggest obstruction.  No free intra-abdominal air is identified on the provided upright view.  No acute osseous abnormalities are seen; mild sclerotic change is noted at the sacroiliac joints.  IMPRESSION:  1.  Unremarkable bowel gas pattern; no free intra-abdominal air seen.  Fluid and air noted filling small and large bowel loops. 2.  Minimal left basilar airspace opacity likely reflects atelectasis; lungs otherwise clear.  Original Report Authenticated By: Tonia Ghent, M.D.      MDM  Patient with urinalysis that is consistent with urinary tract infection given the WBC count being too numerous to count.  There is only few bacteria though.  I am concerned about the possibility of a colitis given the patient's predominant symptoms of vomiting and diarrhea which would be more GI in nature. Patient does have a known history of  colitis.  I will obtain a CT of the patient's abdomen and pelvis to evaluate for further colitis.  I anticipate turning this patient over to the oncoming physician.  If the patient's CAT scan is normal and the patient can tolerate by mouth and has normal vital signs she may be able to go home with treatment for urinary tract infection.  If the patient is not able to tolerate by mouth she will require admission.        Nat Christen, MD 04/28/11 [phone]

## 2011-04-28 NOTE — ED Notes (Signed)
Pt started with nausea, vomiting and diarrhea x2 hours ago

## 2011-04-28 NOTE — ED Notes (Signed)
Pt here with n,v,d and complains of abd pain in the low abd, states 4 episode of vomiting and 3 episodes of water like diarrhea, has a history of colitis

## 2011-04-28 NOTE — ED Provider Notes (Signed)
Care assumed from Dr. Golda Acre awaiting CT results.  I agree with the history and physical, assessment and plan as documented by her.  The CT returned showed no acute issues and the patient was feeling better with the treatments given.  She was examined and the abdominal exam was benign.  Will treat with cipro for uti, lortab for pain.  To return prn.    Geoffery Lyons, MD 04/28/11 [phone]

## 2011-05-01 LAB — URINE CULTURE

## 2011-05-02 NOTE — ED Notes (Signed)
+   Urine culture. Resistant to prescribed Cipro. Chart sent to EDP office for review. 

## 2011-05-05 NOTE — ED Notes (Addendum)
Rx for bactrim DS BID x 3 days.  Follow up with PCP per Felicie Morn.Patient  wants rx called to [facility] [phone]

## 2011-05-07 NOTE — ED Notes (Addendum)
rx called to [facility] [phone] by Sabino Gasser PFM

## 2011-05-23 ENCOUNTER — Other Ambulatory Visit: Payer: Self-pay | Admitting: Internal Medicine

## 2011-06-16 ENCOUNTER — Other Ambulatory Visit: Payer: Self-pay | Admitting: Internal Medicine

## 2011-07-08 ENCOUNTER — Ambulatory Visit (INDEPENDENT_AMBULATORY_CARE_PROVIDER_SITE_OTHER): Payer: Medicare Other | Admitting: Internal Medicine

## 2011-07-08 ENCOUNTER — Encounter: Payer: Self-pay | Admitting: Internal Medicine

## 2011-07-08 VITALS — BP 128/68 | HR 70 | Temp 97.2°F | Ht 59.0 in | Wt 238.2 lb

## 2011-07-08 DIAGNOSIS — R32 Unspecified urinary incontinence: Secondary | ICD-10-CM | POA: Diagnosis not present

## 2011-07-08 DIAGNOSIS — E039 Hypothyroidism, unspecified: Secondary | ICD-10-CM

## 2011-07-08 DIAGNOSIS — Z23 Encounter for immunization: Secondary | ICD-10-CM

## 2011-07-08 DIAGNOSIS — I10 Essential (primary) hypertension: Secondary | ICD-10-CM | POA: Diagnosis not present

## 2011-07-08 DIAGNOSIS — E785 Hyperlipidemia, unspecified: Secondary | ICD-10-CM

## 2011-07-08 DIAGNOSIS — D649 Anemia, unspecified: Secondary | ICD-10-CM | POA: Diagnosis not present

## 2011-07-08 DIAGNOSIS — N390 Urinary tract infection, site not specified: Secondary | ICD-10-CM

## 2011-07-08 DIAGNOSIS — N3946 Mixed incontinence: Secondary | ICD-10-CM

## 2011-07-08 DIAGNOSIS — Z0000 Encounter for general adult medical examination without abnormal findings: Secondary | ICD-10-CM

## 2011-07-08 LAB — COMPREHENSIVE METABOLIC PANEL
ALT: 9 U/L (ref 0–35)
AST: 15 U/L (ref 0–37)
Albumin: 4.1 g/dL (ref 3.5–5.2)
Alkaline Phosphatase: 73 U/L (ref 39–117)
Glucose, Bld: 86 mg/dL (ref 70–99)
Potassium: 4.7 mEq/L (ref 3.5–5.3)
Sodium: 139 mEq/L (ref 135–145)
Total Protein: 7.1 g/dL (ref 6.0–8.3)

## 2011-07-08 LAB — CBC WITH DIFFERENTIAL/PLATELET
Basophils Absolute: 0 10*3/uL (ref 0.0–0.1)
Basophils Relative: 0 % (ref 0–1)
Eosinophils Absolute: 0.1 10*3/uL (ref 0.0–0.7)
MCH: 28.5 pg (ref 26.0–34.0)
MCHC: 30.6 g/dL (ref 30.0–36.0)
Monocytes Absolute: 1.2 10*3/uL — ABNORMAL HIGH (ref 0.1–1.0)
Monocytes Relative: 12 % (ref 3–12)
Neutro Abs: 6.6 10*3/uL (ref 1.7–7.7)
Neutrophils Relative %: 62 % (ref 43–77)
RDW: 14.6 % (ref 11.5–15.5)

## 2011-07-08 LAB — LIPID PANEL
LDL Cholesterol: 87 mg/dL (ref 0–99)
VLDL: 19 mg/dL (ref 0–40)

## 2011-07-08 LAB — TSH: TSH: 2.887 u[IU]/mL (ref 0.350–4.500)

## 2011-07-08 MED ORDER — TOLTERODINE TARTRATE ER 4 MG PO CP24
4.0000 mg | ORAL_CAPSULE | Freq: Every day | ORAL | Status: DC
Start: 2011-07-08 — End: 2011-09-30

## 2011-07-08 MED ORDER — SULFAMETHOXAZOLE-TRIMETHOPRIM 800-160 MG PO TABS: 1.0000 | ORAL_TABLET | Freq: Two times a day (BID) | ORAL | Status: AC

## 2011-07-08 MED ORDER — SIMVASTATIN 10 MG PO TABS: 10.0000 mg | ORAL_TABLET | Freq: Every day | ORAL | Status: DC

## 2011-07-08 MED ORDER — OMEPRAZOLE 40 MG PO CPDR: 40.0000 mg | DELAYED_RELEASE_CAPSULE | Freq: Every day | ORAL | Status: DC

## 2011-07-08 NOTE — Assessment & Plan Note (Signed)
Patient is doing well without symptoms of hypo-or hyper thyroidism. Will check TSH today.

## 2011-07-08 NOTE — Assessment & Plan Note (Signed)
We'll give annual flu vaccination today.  Colonoscopy discussion deferred until followup appointment in 2 weeks.

## 2011-07-08 NOTE — Progress Notes (Signed)
Patient ID: Lisa Haynes, female   DOB: 1941-04-11, 71 y.o.   MRN: [id number]  Subjective:   HPI Patient is a 71 y/o F here today for routine follow up.  Her primary concern today is worsening incontinence.  Approximately 3 weeks prior to her office visit she experienced an episode or urinary incontinence while asleep.  This has never occurred previously and she has since had one additional event that also occurred while asleep.  She admits to increased urinary frequency and well as urgency in addition her previous symptoms of incontinence.  She denies dysuria and hematuria.  She denies fever and chills.  In December 2012, she was evaluated in the emergency room for diarrhea and diagnosed with viral gastroenteritis in addition to a urinary tract infection.  The urinalysis performed at that ER visit revealed pyuria with too numerous to count white blood cells as well as positive nitrite, large leukocytes and few squamous cells; urine culture was obtained and grew 50,000 CFU's of E. Coli.  She was prescribed ciprofloxacin for empiric treatment at ER discharge however sensitivities from her Escherichia coli returned indicating fluoroquinolone resistance.  She did not receive any other rx for an alternative antibiotic.  She denies flank pain.  Please see the A&P for the status of the pt's chronic medical problems.   Review of Systems Constitutional: Negative for fever, chills, diaphoresis, activity change, appetite change, fatigue and unexpected weight change.  HENT: Negative for hearing loss, congestion and neck stiffness.   Eyes: Negative for photophobia, pain and visual disturbance.  Respiratory: Negative for cough, chest tightness, shortness of breath and wheezing.   Cardiovascular: Negative for chest pain and palpitations.  Gastrointestinal: Negative for nausea, abdominal pain, diarrhea, blood in stool and anal bleeding.  Genitourinary: Negative for dysuria, hematuria and difficulty urinating.    Musculoskeletal: Negative for joint swelling.  Skin: Negative for rash.  Neurological: Negative for dizziness, syncope, speech difficulty, weakness, numbness and headaches.     Objective:   Physical Exam Vital signs reviewed. GEN: No apparent distress.  Alert and oriented x 3.  Pleasant, conversant, and cooperative to exam. HEENT: head is autraumatic and normocephalic.  Neck is supple without palpable masses or lymphadenopathy.  No JVD or carotid bruits.  Vision intact.  EOMI.  Sclerae anicteric.  Conjunctivae without pallor or injection. Mucous membranes are moist.  Oropharynx is without erythema, exudates, or other abnormal lesions.  Dentures in place. RESP:  Lungs are clear to ascultation bilaterally with good air movement.  No wheezes, ronchi, or rubs. CARDIOVASCULAR: regular rate, normal rhythm.  Clear S1, S2, no gallops, or rubs. ABDOMEN: soft, non-tender, non-distended.  Bowels sounds present in all quadrants and normoactive.  No palpable masses.  No CVA tenderness. EXT: warm and dry.  No clubbing or cyanosis.  +1 pitting edema in bilateral lower extremities.  Mild changes of chronic venous stasis are noted on bilateral LEs. SKIN: warm and dry with normal turgor.   Assessment & Plan:

## 2011-07-08 NOTE — Assessment & Plan Note (Signed)
She is tolerating her statin well but unfortunately has been out of her medication for the past 2 weeks.  When she called to obtain a refill she was informed that the pharmacy was unable to fill this prescription.  It is unclear if there were no refills remaining or if there is a problem with Medicare/Medicaid payment.  She has no remaining refills on any of her medications and it seems she was out of refills for simvastatin; unfortunately there were no refill requests submitted to the [facility] office.  Patient's niece is going to contact the pharmacy to determine where the problem lies.  Will submit refills for her statin today. If the issue is a problem with Medicare/Medicaid, will refer the patient to a social worker/[facility] for further assistance .   Will check LFTs and lipid panel today.

## 2011-07-08 NOTE — Assessment & Plan Note (Signed)
Pt has unspecified anemia with hemoglobin 10.1 in 2011.  Subsequent CBCs have revealed hemoglobin is within normal limits.  Will repeat a CBC today.  If the results are normal, will remove anemia from her problem list.

## 2011-07-08 NOTE — Assessment & Plan Note (Signed)
Patient's acute worsening of urinary incontinence with associated urinary frequency and increased urgency suggests a urinary tract infection. She was diagnosed with a UTI in December 2012; urinalysis obtained at that time was consistent with acute urinary tract infection however culture only a little 50,000 colonies of Escherichia coli.  It is unclear if she was symptomatic at that time.   She was prescribed a course of Cipro for empiric treatment but sensitivity results revealed resistance to ciprofloxacin and the patient never received an alternative antibiotic.  She does not have any exam findings to suggest pyelonephritis and is afebrile and hemodynamically stable.   Will treat her with a three-day course of Bactrim today (Escherichia coli isolate from December is Bactrim sensitive) and repeat urinalysis with culture.  Hopefully, this will improve some of the symptoms of her incontinence.  Will adjust antibiotic regimen accordingly if sensitivity reveals resistance to Bactrim.

## 2011-07-08 NOTE — Assessment & Plan Note (Signed)
Patient continues to experience significant difficulty with her incontinence.  She has not noticed a significant benefit after starting treatment with Detrol.  I believe the acute worsening of her incontinence and increased urinary frequency with urgency may be the result of a urinary tract infection.  Will treat empirically with a 3 day course of Bactrim and repeat urinalysis with culture today.  Will increase Detrol to 4 mg daily.   Briefly reviewed behavior patterns discussed at our prior OV.  She wants to stop Lasix as she feels the medicine significantly exacerbates/contributes to her symptoms.  As she only uses this medication for treatment of lower extremity edema and does not suffer from systolic heart failure, a trial of cessation seems very reasonable.  Will discontinue Lasix for the next 2 weeks and have her follow up for BP check, symptom assessment, and to evaluate for worsening edema.  Patient is understandably distressed about her incontinence; her symptoms significantly affect her ability to enjoy life.  Will refer her to urology for further evaluation and treatment.

## 2011-07-08 NOTE — Assessment & Plan Note (Signed)
Blood pressure is well-controlled on her current regimen. Will check a compressive metabolic panel today to assess the left leg status and renal function.  Will stop Lasix as described in the assessment and plan for mixed incontinence.   This may potentially result in poor control of her hypertension.  Will have her return in 2 weeks for blood pressure check.  If her blood pressure is above goal and just her antihypertensive regimen accordingly.

## 2011-07-08 NOTE — Patient Instructions (Signed)
Schedule followup appointment with Dr. Arvilla Market in 2 weeks. Stop taking Lasix (furosemide). Bactrim is an antibiotic to treat your urinary tract infection.  Use this medicine as directed and be sure not to miss any doses. We will refer you to a urologist for further evaluation and treatment of your urinary incontinence. Continue to take all of your medications as directed. Please call the clinic with any concerns or questions. Please call to let us know if you need any assistance with Medicaid/Medicare services or are unable to obtain medication so that we can get in touch with a social worker or case manager from the [employer].

## 2011-07-09 LAB — URINALYSIS, ROUTINE W REFLEX MICROSCOPIC
Nitrite: POSITIVE — AB
Specific Gravity, Urine: 1.022 (ref 1.005–1.030)
Urobilinogen, UA: 0.2 mg/dL (ref 0.0–1.0)
pH: 7 (ref 5.0–8.0)

## 2011-07-09 LAB — URINALYSIS, MICROSCOPIC ONLY: Crystals: NONE SEEN

## 2011-07-10 LAB — URINE CULTURE: Colony Count: >=100000

## 2011-07-12 ENCOUNTER — Other Ambulatory Visit: Payer: Self-pay | Admitting: Internal Medicine

## 2011-07-12 ENCOUNTER — Telehealth: Payer: Self-pay | Admitting: Internal Medicine

## 2011-07-12 DIAGNOSIS — N390 Urinary tract infection, site not specified: Secondary | ICD-10-CM

## 2011-07-12 MED ORDER — NITROFURANTOIN MONOHYD MACRO 100 MG PO CAPS: 100.0000 mg | ORAL_CAPSULE | Freq: Two times a day (BID) | ORAL | Status: AC

## 2011-07-12 NOTE — Telephone Encounter (Signed)
Called to discuss results of urine cx and sensitivities of E. Coli.  She was empirically prescribed Bactrim however sensitivity results indicate resistance to this.  The Escherichia coli isolate was susceptible to Macrobid.  She does not have renal insufficiency; will prescribe a 7 day course of treatment.  Patient  unavailable at the time of call; message left for her to all the clinic to discuss results. Will submit prescription for Macrobid now slowly ready for her to pick up today. Will attempt to call patient at this afternoon if I do not hear from her before then.

## 2011-07-19 ENCOUNTER — Other Ambulatory Visit: Payer: Self-pay | Admitting: *Deleted

## 2011-07-19 MED ORDER — LEVOTHYROXINE SODIUM 25 MCG PO TABS: 25.0000 ug | ORAL_TABLET | Freq: Every day | ORAL | Status: DC

## 2011-07-21 ENCOUNTER — Encounter: Payer: Self-pay | Admitting: Internal Medicine

## 2011-07-21 ENCOUNTER — Ambulatory Visit (INDEPENDENT_AMBULATORY_CARE_PROVIDER_SITE_OTHER): Payer: Medicare Other | Admitting: Internal Medicine

## 2011-07-21 VITALS — BP 124/63 | HR 78 | Temp 97.8°F | Ht 59.0 in | Wt 241.2 lb

## 2011-07-21 DIAGNOSIS — N3946 Mixed incontinence: Secondary | ICD-10-CM | POA: Diagnosis not present

## 2011-07-21 DIAGNOSIS — R609 Edema, unspecified: Secondary | ICD-10-CM

## 2011-07-21 DIAGNOSIS — R600 Localized edema: Secondary | ICD-10-CM

## 2011-07-21 DIAGNOSIS — I10 Essential (primary) hypertension: Secondary | ICD-10-CM

## 2011-07-21 MED ORDER — LEVOTHYROXINE SODIUM 25 MCG PO TABS: 25.0000 ug | ORAL_TABLET | Freq: Every day | ORAL | Status: DC

## 2011-07-21 MED ORDER — CITALOPRAM HYDROBROMIDE 40 MG PO TABS: 40.0000 mg | ORAL_TABLET | Freq: Every day | ORAL | Status: DC

## 2011-07-21 MED ORDER — LISINOPRIL 10 MG PO TABS: 10.0000 mg | ORAL_TABLET | Freq: Every day | ORAL | Status: DC

## 2011-07-21 MED ORDER — TRAMADOL HCL 50 MG PO TABS: 50.0000 mg | ORAL_TABLET | Freq: Two times a day (BID) | ORAL | Status: DC | PRN

## 2011-07-21 NOTE — Patient Instructions (Addendum)
Schedule followup appointment with Dr. Arvilla Market in May. Taking all of your medicine as directed

## 2011-07-21 NOTE — Progress Notes (Signed)
Patient ID: Lisa Haynes, female   DOB: May 01, 1941, 71 y.o.   MRN: [id number]   Subjective:   HPI Patient is a 71 y/o F here today for routine follow up.  At her last visit, her primary concern was urinary incontinence and pt desired discontinuation of Lasix as she felt this medication exacerbated her symptoms.  She is here today for repeat BP check and to assess her lower extremity edema. She states she is doing well.  She feels her symptoms of incontinence are much better controlled following the increase in Detrol to 4 mg daily.  She has not noticed any significant increase in her lower extremity edema following discontinuation of Lasix and wishes to continue off of Lasix. She has no other concerns or complaints today  Review of Systems Constitutional: Negative for fever, chills, diaphoresis, activity change, appetite change, fatigue and unexpected weight change.  HENT: Negative for hearing loss, congestion and neck stiffness.   Eyes: Negative for photophobia, pain and visual disturbance.  Respiratory: Negative for cough, chest tightness, shortness of breath and wheezing.   Cardiovascular: Negative for chest pain and palpitations.  Gastrointestinal: Negative for nausea, abdominal pain, diarrhea, blood in stool and anal bleeding.  Genitourinary: Negative for dysuria, hematuria and difficulty urinating.  Musculoskeletal: Negative for joint swelling.  Skin: Negative for rash.  Neurological: Negative for dizziness, syncope, speech difficulty, weakness, numbness and headaches.     Objective:   Physical Exam Vital signs reviewed. GEN: No apparent distress.  Alert and oriented x 3.  Pleasant, conversant, and cooperative to exam. HEENT: head is autraumatic and normocephalic.  Neck is supple without palpable masses or lymphadenopathy.  No JVD or carotid bruits.  Vision intact.  EOMI.  Sclerae anicteric.  Conjunctivae without pallor or injection. Mucous membranes are moist.  Oropharynx is without  erythema, exudates, or other abnormal lesions.  Dentures in place. RESP:  Lungs are clear to ascultation bilaterally with good air movement.  No wheezes, ronchi, or rubs. CARDIOVASCULAR: regular rate, normal rhythm.  Clear S1, S2, no gallops, or rubs. ABDOMEN: soft, non-tender, non-distended.  Bowels sounds present in all quadrants and normoactive.  No palpable masses.  No CVA tenderness. EXT: warm and dry.  No clubbing or cyanosis.  +1 pitting edema in bilateral lower extremities, L>R.  Mild changes of chronic venous stasis are noted on bilateral LEs. SKIN: warm and dry with normal turgor.   Assessment & Plan:

## 2011-07-21 NOTE — Assessment & Plan Note (Signed)
Incontinence is improved following treatment of urinary tract infection, discontinuation of Lasix, and increase of Detrol.  We'll continue with Detrol 4 mg daily.

## 2011-07-21 NOTE — Assessment & Plan Note (Signed)
Blood pressure remains at goal and unchanged following discontinuation of Lasix.  Will continue lisinopril 10 mg daily.

## 2011-07-21 NOTE — Assessment & Plan Note (Signed)
Edema is not significantly worse following discontinuation of Lasix. Advised patient to keep her legs elevated throughout the day.  Will remove Lasix from her medication list.

## 2011-07-27 ENCOUNTER — Other Ambulatory Visit: Payer: Self-pay | Admitting: Internal Medicine

## 2011-07-27 NOTE — Telephone Encounter (Signed)
Pharmacy aware - denied. 

## 2011-09-15 ENCOUNTER — Encounter: Payer: Medicare Other | Admitting: Internal Medicine

## 2011-09-29 ENCOUNTER — Encounter: Payer: Medicare Other | Admitting: Internal Medicine

## 2011-09-30 ENCOUNTER — Ambulatory Visit (INDEPENDENT_AMBULATORY_CARE_PROVIDER_SITE_OTHER): Payer: Medicare Other | Admitting: Internal Medicine

## 2011-09-30 ENCOUNTER — Encounter: Payer: Self-pay | Admitting: Internal Medicine

## 2011-09-30 VITALS — BP 166/78 | HR 71 | Temp 97.2°F | Wt 245.1 lb

## 2011-09-30 DIAGNOSIS — I10 Essential (primary) hypertension: Secondary | ICD-10-CM | POA: Diagnosis not present

## 2011-09-30 DIAGNOSIS — N3946 Mixed incontinence: Secondary | ICD-10-CM | POA: Diagnosis not present

## 2011-09-30 DIAGNOSIS — F064 Anxiety disorder due to known physiological condition: Secondary | ICD-10-CM | POA: Diagnosis not present

## 2011-09-30 MED ORDER — TOLTERODINE TARTRATE ER 4 MG PO CP24: 4.0000 mg | ORAL_CAPSULE | Freq: Every day | ORAL | Status: DC

## 2011-09-30 MED ORDER — CITALOPRAM HYDROBROMIDE 40 MG PO TABS: 40.0000 mg | ORAL_TABLET | Freq: Every day | ORAL | Status: DC

## 2011-09-30 MED ORDER — LISINOPRIL 20 MG PO TABS: 10.0000 mg | ORAL_TABLET | Freq: Every day | ORAL | Status: DC

## 2011-09-30 MED ORDER — BUSPIRONE HCL 10 MG PO TABS: ORAL_TABLET | ORAL | Status: DC

## 2011-09-30 NOTE — Assessment & Plan Note (Signed)
BP slightly above goal today.  Will increase lisinopril to 20mg  daily and have patient return in 1-2 months for repeat BP check and BMET to assess electrolyte status and renal function.

## 2011-09-30 NOTE — Patient Instructions (Signed)
Please schedule an appointment in 3 months to meet your new doctor.  You will need a blood pressure check and blood work your next appointment. Yourlisinopril was increased to 20 mg daily. Take two 10 mg tablets until you run out and then fill your new prescription for 20 mg tablets. Buspirone is a new medicine to help with anxiety. Use as directed. If you develop any side effects or other concerning symptoms contact the clinic at [phone]. Keep taking all of your medicine as directed.

## 2011-09-30 NOTE — Progress Notes (Signed)
Patient ID: Lisa Haynes, female   DOB: 04-27-1941, 71 y.o.   MRN: [id number]  Subjective:   HPI Patient is a 71 y/o female here today for routine follow up and medication refill.  She states she is feeling well but continues to experience some difficulties with anxiety. She states she often feels panicky and very anxious when going out.  She notes anytime she is around "a lot of people" she begins to feel very anxious and uncomfortable. This has resulted in her spending the majority of time at her house.   She has no other concerns or complaints today  Review of Systems Constitutional: Negative for fever, chills, diaphoresis, activity change, appetite change, fatigue and unexpected weight change.  HENT: Negative for hearing loss, congestion and neck stiffness.   Eyes: Negative for photophobia, pain and visual disturbance.  Respiratory: Negative for cough, chest tightness, shortness of breath and wheezing.   Cardiovascular: Negative for chest pain and palpitations.  Gastrointestinal: Negative for nausea, abdominal pain, diarrhea, blood in stool and anal bleeding.  Genitourinary: Negative for dysuria, hematuria and difficulty urinating.  Musculoskeletal: Negative for joint swelling.  Skin: Negative for rash.  Neurological: Negative for dizziness, syncope, speech difficulty, weakness, numbness and headaches.     Objective:   Physical Exam Vital signs reviewed. GEN: No apparent distress.  Alert and oriented x 3.  Pleasant, conversant, and cooperative to exam. HEENT: head is autraumatic and normocephalic.  Neck is supple without palpable masses or lymphadenopathy.  No JVD or carotid bruits.  Vision intact.  EOMI.  Sclerae anicteric.  Conjunctivae without pallor or injection. Mucous membranes are moist.  Oropharynx is without erythema, exudates, or other abnormal lesions.  Dentures in place. RESP:  Lungs are clear to ascultation bilaterally with good air movement.  No wheezes, ronchi, or  rubs. CARDIOVASCULAR: regular rate, normal rhythm.  Clear S1, S2, no gallops, or rubs. ABDOMEN: soft, non-tender, non-distended.  Bowels sounds present in all quadrants and normoactive.  No palpable masses.  No CVA tenderness. EXT: warm and dry.  No clubbing or cyanosis.  +1 pitting edema in bilateral lower extremities.  Mild changes of chronic venous stasis are noted on bilateral LEs. SKIN: warm and dry with normal turgor.   Assessment & Plan:

## 2011-10-01 NOTE — Assessment & Plan Note (Signed)
Patient is currently on 40 mg of Celexa daily. Unfortunately there is no room to further increase this medication.  Will add BuSpar to her citalopram for additional relief of her anxiety. Patient is advised to begin by taking one half of a tablet twice daily and to increase to one full tablet twice daily over the next 1-2 weeks.   If she continues to experience significant anxiety with agoraphobia that is not improved with BuSpar, we'll plan to refer her to a psychiatrist for additional evaluation and treatment. Patient is advised to contact the clinic if she develops any adverse effects to BuSpar

## 2011-10-01 NOTE — Assessment & Plan Note (Signed)
Patient feels her incontinence is somewhat improved though she still has significant problems with frequency and urgency. She states she urinates fairly regularly, almost every 30 minutes. She is attempting to decrease the amount of water she drinks daily and limit it to 2 glasses in addition to the coffee she drinks the morning. This has helped slightly. She is doing well occult area again and has an upcoming appointment with urology next week. She denies any flank pain, fever, dysuria, or hematuria.

## 2011-10-05 ENCOUNTER — Other Ambulatory Visit: Payer: Self-pay | Admitting: Internal Medicine

## 2011-10-14 NOTE — Addendum Note (Signed)
Addended by: Neomia Dear on: 10/14/2011 03:27 PM   Modules accepted: Orders

## 2012-01-16 ENCOUNTER — Other Ambulatory Visit: Payer: Self-pay | Admitting: Internal Medicine

## 2012-01-18 ENCOUNTER — Ambulatory Visit: Payer: Medicare Other | Admitting: Internal Medicine

## 2012-01-26 ENCOUNTER — Ambulatory Visit (INDEPENDENT_AMBULATORY_CARE_PROVIDER_SITE_OTHER): Payer: Medicare Other | Admitting: Internal Medicine

## 2012-01-26 ENCOUNTER — Encounter: Payer: Self-pay | Admitting: Internal Medicine

## 2012-01-26 VITALS — BP 128/72 | HR 70 | Temp 98.1°F | Wt 252.6 lb

## 2012-01-26 DIAGNOSIS — Z0000 Encounter for general adult medical examination without abnormal findings: Secondary | ICD-10-CM

## 2012-01-26 DIAGNOSIS — F064 Anxiety disorder due to known physiological condition: Secondary | ICD-10-CM

## 2012-01-26 DIAGNOSIS — N3946 Mixed incontinence: Secondary | ICD-10-CM

## 2012-01-26 DIAGNOSIS — E039 Hypothyroidism, unspecified: Secondary | ICD-10-CM

## 2012-01-26 DIAGNOSIS — Z23 Encounter for immunization: Secondary | ICD-10-CM

## 2012-01-26 DIAGNOSIS — I10 Essential (primary) hypertension: Secondary | ICD-10-CM | POA: Diagnosis not present

## 2012-01-26 DIAGNOSIS — K219 Gastro-esophageal reflux disease without esophagitis: Secondary | ICD-10-CM

## 2012-01-26 DIAGNOSIS — E785 Hyperlipidemia, unspecified: Secondary | ICD-10-CM | POA: Diagnosis not present

## 2012-01-26 LAB — BASIC METABOLIC PANEL WITH GFR
BUN: 15 mg/dL (ref 6–23)
Chloride: 101 mEq/L (ref 96–112)
GFR, Est African American: 70 mL/min
GFR, Est Non African American: 60 mL/min
Glucose, Bld: 86 mg/dL (ref 70–99)
Potassium: 4.4 mEq/L (ref 3.5–5.3)
Sodium: 139 mEq/L (ref 135–145)

## 2012-01-26 MED ORDER — LISINOPRIL 20 MG PO TABS: 20.0000 mg | ORAL_TABLET | Freq: Every day | ORAL | Status: DC

## 2012-01-26 MED ORDER — BUSPIRONE HCL 10 MG PO TABS: ORAL_TABLET | ORAL | Status: DC

## 2012-01-26 MED ORDER — OMEPRAZOLE 40 MG PO CPDR: 40.0000 mg | DELAYED_RELEASE_CAPSULE | Freq: Every day | ORAL | Status: DC

## 2012-01-26 MED ORDER — TOLTERODINE TARTRATE ER 4 MG PO CP24: 4.0000 mg | ORAL_CAPSULE | Freq: Every day | ORAL | Status: DC

## 2012-01-26 MED ORDER — SIMVASTATIN 10 MG PO TABS: 10.0000 mg | ORAL_TABLET | Freq: Every day | ORAL | Status: DC

## 2012-01-26 MED ORDER — LEVOTHYROXINE SODIUM 25 MCG PO TABS: 25.0000 ug | ORAL_TABLET | Freq: Every day | ORAL | Status: DC

## 2012-01-26 NOTE — Progress Notes (Signed)
Patient ID: Lisa Haynes, female   DOB: 10/08/1940, 71 y.o.   MRN: [id number]  Subjective:   Patient ID: Lisa Haynes female   DOB: 1940-12-15 71 y.o.   MRN: [id number]  HPI: [name] is a 71 y.o. female who presents for a routine f/u and for medication refills.   She is doing well today. She is still experiencing stress incontinence after drinking any liquid, but states it is better since her last clinic visit when she was started on Detrol LA. She was referred to Urology at her [facility], but would not go the the appt b/c the physician was a female and she did not feel comfortable. She does want another referral but to see a female Urologist.  At her last visit, Buspar was added to her regimen along with the Celexa. Her anxiety appears to be improved, but she has not been out in large crowds. She denies any anxiety coming to the doctor's office, which would have previously caused anxiety.    Past Medical History  Diagnosis Date  . Bilateral knee pain   . Depression   . Hypertension   . Low back pain   . Osteoarthritis   . Osteoporosis    Current Outpatient Prescriptions  Medication Sig Dispense Refill  . busPIRone (BUSPAR) 10 MG tablet Take one half of a tab in the am and one at night.  60 tablet  6  . Calcium Carbonate-Vitamin D (CALCARB 600/D PO) Take by mouth 3 (three) times daily.       Marland Kitchen levothyroxine (SYNTHROID, LEVOTHROID) 25 MCG tablet Take 1 tablet (25 mcg total) by mouth daily.  30 tablet  6  . lisinopril (PRINIVIL,ZESTRIL) 20 MG tablet Take 1 tablet (20 mg total) by mouth daily.  30 tablet  6  . omeprazole (PRILOSEC) 40 MG capsule Take 1 capsule (40 mg total) by mouth daily.  30 capsule  11  . simvastatin (ZOCOR) 10 MG tablet Take 1 tablet (10 mg total) by mouth at bedtime.  90 tablet  4  . tolterodine (DETROL LA) 4 MG 24 hr capsule Take 1 capsule (4 mg total) by mouth daily.  90 capsule  2  . traMADol (ULTRAM) 50 MG tablet Take 1 tablet (50 mg total) by mouth 2 (two)  times daily as needed for pain.  60 tablet  3   No family history on file. History   Social History  . Marital Status: Legally Separated    Spouse Name: N/A    Number of Children: N/A  . Years of Education: N/A   Social History Main Topics  . Smoking status: Never Smoker   . Smokeless tobacco: None  . Alcohol Use: No  . Drug Use: No  . Sexually Active: None   Other Topics Concern  . None   Social History Narrative   Retired: Motel workDivorced Regular exercise- no   Review of Systems: Constitutional: Denies fever, chills, diaphoresis, appetite change and fatigue.  HEENT: Denies photophobia, eye pain, redness, hearing loss, ear pain, congestion, sore throat, rhinorrhea, sneezing, mouth sores, trouble swallowing, neck pain, neck stiffness and tinnitus.   Respiratory: Denies SOB, chest tightness,  and wheezing.   Cardiovascular: Endorses BLE edema. Denies chest pain.  Gastrointestinal: Denies nausea, vomiting, abdominal pain, diarrhea, constipation, blood in stool and abdominal distention.  Genitourinary: Endorses urinary incontinence.  Musculoskeletal: Difficulty ambulating due to BLE edema.   Skin: Denies pallor, rash and wound.  Neurological: Denies dizziness, seizures, syncope, weakness, and headaches.  Psychiatric/Behavioral: Denies suicidal ideation or mood changes  Objective:  Physical Exam: Filed Vitals:   01/26/12 0953  BP: 128/72  Pulse: 70  Temp: 98.1 F (36.7 C)  TempSrc: Oral  Weight: 252 lb 9.6 oz (114.579 kg)  SpO2: 99%   Constitutional: Vital signs reviewed.  Patient is a well-developed and well-nourished female in no acute distress and cooperative with exam. Alert and oriented x3.  Head: Normocephalic and atraumatic Eyes: PERRL, EOMI,no scleral icterus.  Neck: Supple, Trachea midline normal ROM, No JVD, mass, or thyromegaly. Cardiovascular: RRR,no MRG, pulses symmetric and intact bilaterally Pulmonary/Chest: CTAB, no wheezes, rales, or  rhonchi Abdominal: Obese, soft, non-tender Musculoskeletal: Significant trace pitting BLE edema. Hematology: No cervical adenopathy.  Neurological: A&O x3, Strength is normal and symmetric bilaterally, cranial nerve II-XII are grossly intact, no focal motor deficit, sensory intact to light touch bilaterally.  Skin: Warm, dry and intact. No rash, cyanosis, or clubbing.  Psychiatric: Normal mood and affect. Speech and behavior is normal.   Assessment & Plan:   Please refer to Problem List based A&P.

## 2012-01-26 NOTE — Patient Instructions (Addendum)
*   Start taking 1/2 of a Celexa (citalopram) tablet for 1 week. Then take 1/2 tablet every other day for one week, and then you can stop the medication completely.  * Please continue to take the Buspar (buspirone) as prescribed. If you begin feeling down, have anxiety, or experience any changes to your mood, please call the clinic.  * Continue taking all of your other medications as prescribed.  We will schedule you an appointment with a female Urologist and call you with the appointment.  Follow up with Dr. Lonzo Cloud at the end of November.

## 2012-01-27 NOTE — Assessment & Plan Note (Signed)
Sx improved with Detrol LA. Did not go to Urology appt b/c pt wants to be seen by a female Urologist.  - Urology referral for female Urologist - Continue Detrol

## 2012-01-27 NOTE — Assessment & Plan Note (Addendum)
BP controlled on Lisinopril, 128/72 today.  - Continue Lisinopril 20mg  daily - Checking BMP to make sure electrolytes are normal.

## 2012-01-27 NOTE — Assessment & Plan Note (Addendum)
Her anxiety seems to be improved with the addition of the Buspar. Due to the concern for possible serotonin syndrome from taking both the Buspar and the Celexa, will taper off the Celexa and continue the Buspar. The pt is to call if her sx worsen. Can increase Buspar up to 30mg /day given BID-TID if needed. - Celexa taper: 1/2 tab x7d, 1/2 tab qod x7d, then stop taking all together. - Continue Buspar 1/2 tab qam and 1 tab qhs.

## 2012-01-30 NOTE — Progress Notes (Signed)
I saw, examined, and discussed the patient with Dr Sherrine Maples and agree with the note contained here. The risk of seratonin syndrome was considered when Buspar was added. However, since Celexa was not controlling her sxs, which necessitated the addition of Buspar, agree with titrating Celexa to off and see if Buspar alone can control sxs.

## 2012-03-03 DIAGNOSIS — N3946 Mixed incontinence: Secondary | ICD-10-CM | POA: Diagnosis not present

## 2012-03-03 DIAGNOSIS — N3020 Other chronic cystitis without hematuria: Secondary | ICD-10-CM | POA: Diagnosis not present

## 2012-03-03 DIAGNOSIS — N3944 Nocturnal enuresis: Secondary | ICD-10-CM | POA: Diagnosis not present

## 2012-04-19 ENCOUNTER — Other Ambulatory Visit: Payer: Self-pay | Admitting: Internal Medicine

## 2012-04-20 ENCOUNTER — Ambulatory Visit: Payer: Medicare Other | Admitting: Internal Medicine

## 2012-04-21 ENCOUNTER — Ambulatory Visit: Payer: Medicare Other | Admitting: Internal Medicine

## 2012-05-31 ENCOUNTER — Encounter (HOSPITAL_COMMUNITY): Payer: Self-pay | Admitting: Emergency Medicine

## 2012-05-31 ENCOUNTER — Emergency Department (HOSPITAL_COMMUNITY): Payer: Medicare Other

## 2012-05-31 ENCOUNTER — Observation Stay (HOSPITAL_COMMUNITY)
Admission: EM | Admit: 2012-05-31 | Discharge: 2012-06-01 | Disposition: A | Discharge status: Home / Self Care | Payer: Medicare Other | Attending: Internal Medicine | Admitting: Internal Medicine

## 2012-05-31 DIAGNOSIS — E039 Hypothyroidism, unspecified: Secondary | ICD-10-CM | POA: Diagnosis not present

## 2012-05-31 DIAGNOSIS — M810 Age-related osteoporosis without current pathological fracture: Secondary | ICD-10-CM | POA: Diagnosis not present

## 2012-05-31 DIAGNOSIS — R5383 Other fatigue: Secondary | ICD-10-CM | POA: Diagnosis not present

## 2012-05-31 DIAGNOSIS — M549 Dorsalgia, unspecified: Principal | ICD-10-CM | POA: Insufficient documentation

## 2012-05-31 DIAGNOSIS — S79919A Unspecified injury of unspecified hip, initial encounter: Secondary | ICD-10-CM | POA: Diagnosis not present

## 2012-05-31 DIAGNOSIS — G319 Degenerative disease of nervous system, unspecified: Secondary | ICD-10-CM | POA: Diagnosis not present

## 2012-05-31 DIAGNOSIS — S0990XA Unspecified injury of head, initial encounter: Secondary | ICD-10-CM | POA: Diagnosis not present

## 2012-05-31 DIAGNOSIS — S4980XA Other specified injuries of shoulder and upper arm, unspecified arm, initial encounter: Secondary | ICD-10-CM | POA: Diagnosis not present

## 2012-05-31 DIAGNOSIS — M79609 Pain in unspecified limb: Secondary | ICD-10-CM | POA: Insufficient documentation

## 2012-05-31 DIAGNOSIS — E785 Hyperlipidemia, unspecified: Secondary | ICD-10-CM | POA: Diagnosis not present

## 2012-05-31 DIAGNOSIS — M545 Low back pain, unspecified: Secondary | ICD-10-CM

## 2012-05-31 DIAGNOSIS — Z79899 Other long term (current) drug therapy: Secondary | ICD-10-CM | POA: Diagnosis not present

## 2012-05-31 DIAGNOSIS — R079 Chest pain, unspecified: Secondary | ICD-10-CM | POA: Insufficient documentation

## 2012-05-31 DIAGNOSIS — R6889 Other general symptoms and signs: Secondary | ICD-10-CM | POA: Diagnosis not present

## 2012-05-31 DIAGNOSIS — M25559 Pain in unspecified hip: Secondary | ICD-10-CM | POA: Diagnosis not present

## 2012-05-31 DIAGNOSIS — K219 Gastro-esophageal reflux disease without esophagitis: Secondary | ICD-10-CM

## 2012-05-31 DIAGNOSIS — I10 Essential (primary) hypertension: Secondary | ICD-10-CM | POA: Insufficient documentation

## 2012-05-31 DIAGNOSIS — M25519 Pain in unspecified shoulder: Secondary | ICD-10-CM | POA: Diagnosis not present

## 2012-05-31 DIAGNOSIS — R5381 Other malaise: Secondary | ICD-10-CM | POA: Diagnosis not present

## 2012-05-31 DIAGNOSIS — S79929A Unspecified injury of unspecified thigh, initial encounter: Secondary | ICD-10-CM | POA: Diagnosis not present

## 2012-05-31 DIAGNOSIS — S298XXA Other specified injuries of thorax, initial encounter: Secondary | ICD-10-CM | POA: Diagnosis not present

## 2012-05-31 DIAGNOSIS — Y92009 Unspecified place in unspecified non-institutional (private) residence as the place of occurrence of the external cause: Secondary | ICD-10-CM | POA: Insufficient documentation

## 2012-05-31 DIAGNOSIS — E669 Obesity, unspecified: Secondary | ICD-10-CM | POA: Diagnosis present

## 2012-05-31 DIAGNOSIS — Z7902 Long term (current) use of antithrombotics/antiplatelets: Secondary | ICD-10-CM | POA: Insufficient documentation

## 2012-05-31 DIAGNOSIS — R531 Weakness: Secondary | ICD-10-CM

## 2012-05-31 DIAGNOSIS — W19XXXA Unspecified fall, initial encounter: Secondary | ICD-10-CM

## 2012-05-31 DIAGNOSIS — F064 Anxiety disorder due to known physiological condition: Secondary | ICD-10-CM

## 2012-05-31 DIAGNOSIS — IMO0002 Reserved for concepts with insufficient information to code with codable children: Secondary | ICD-10-CM | POA: Diagnosis not present

## 2012-05-31 LAB — URINALYSIS, ROUTINE W REFLEX MICROSCOPIC
Ketones, ur: NEGATIVE mg/dL
Nitrite: NEGATIVE
Protein, ur: NEGATIVE mg/dL
pH: 6.5 (ref 5.0–8.0)

## 2012-05-31 LAB — URINE MICROSCOPIC-ADD ON

## 2012-05-31 LAB — CBC WITH DIFFERENTIAL/PLATELET
Eosinophils Relative: 2 % (ref 0–5)
HCT: 40.7 % (ref 36.0–46.0)
Hemoglobin: 12.8 g/dL (ref 12.0–15.0)
Lymphocytes Relative: 26 % (ref 12–46)
Lymphs Abs: 2.1 10*3/uL (ref 0.7–4.0)
MCV: 90.8 fL (ref 78.0–100.0)
Monocytes Absolute: 1.1 10*3/uL — ABNORMAL HIGH (ref 0.1–1.0)
Monocytes Relative: 14 % — ABNORMAL HIGH (ref 3–12)
Platelets: 208 10*3/uL (ref 150–400)
RBC: 4.48 MIL/uL (ref 3.87–5.11)
WBC: 8.3 10*3/uL (ref 4.0–10.5)

## 2012-05-31 LAB — BASIC METABOLIC PANEL
CO2: 25 mEq/L (ref 19–32)
Chloride: 103 mEq/L (ref 96–112)
Sodium: 138 mEq/L (ref 135–145)

## 2012-05-31 LAB — LACTIC ACID, PLASMA: Lactic Acid, Venous: 0.9 mmol/L (ref 0.5–2.2)

## 2012-05-31 MED ORDER — ZOLPIDEM TARTRATE 5 MG PO TABS: 5.0000 mg | ORAL_TABLET | Freq: Every evening | ORAL | Status: DC | PRN

## 2012-05-31 MED ORDER — SODIUM CHLORIDE 0.9 % IV SOLN
INTRAVENOUS | Status: DC
  Administered 2012-05-31 – 2012-06-01 (×2): via INTRAVENOUS

## 2012-05-31 NOTE — ED Notes (Signed)
[id number]<AV> Expected date:<BR> Expected time:<BR> Means of arrival:<BR> Comments:<BR> 71yo-back/shoulder pain

## 2012-05-31 NOTE — ED Notes (Addendum)
Pt states she sleeps on couch and fell off about 2-3 times this week. Denies hitting head but has in the past.

## 2012-05-31 NOTE — ED Notes (Signed)
Patient transported to CT 

## 2012-05-31 NOTE — ED Provider Notes (Signed)
History     CSN: [id number]  Arrival date & time 05/31/12  1715   First MD Initiated Contact with Patient 05/31/12 1746      Chief Complaint  Patient presents with  . Shoulder Pain  . Back Pain  . Fatigue     HPI Pt was seen at 1800.   Per pt, c/o gradual onset and persistence of constant generalized weakness for the past 2 days.  States she has been "falling a lot" because she has been "so weak."  States she has been sleeping on the couch and has even fallen off the couch recently. States she "tried to move the couch" approx 2 months ago, and is still having left shoulder and back pain.  Denies CP/SOB, no abd pain, no N/V/D, no syncope, no fevers, no tingling/numbness in extremities, no focal motor weakness.     Past Medical History  Diagnosis Date  . Bilateral knee pain   . Depression   . Hypertension   . Low back pain   . Osteoarthritis   . Osteoporosis     Past Surgical History  Procedure Date  . Abdominal hysterectomy     with unilateral oophorectomy    History  Substance Use Topics  . Smoking status: Never Smoker   . Smokeless tobacco: Not on file  . Alcohol Use: No    Review of Systems ROS: Statement: All systems negative except as marked or noted in the HPI; Constitutional: Negative for fever and chills. ; ; Eyes: Negative for eye pain, redness and discharge. ; ; ENMT: Negative for ear pain, hoarseness, nasal congestion, sinus pressure and sore throat. ; ; Cardiovascular: Negative for chest pain, palpitations, diaphoresis, dyspnea and peripheral edema. ; ; Respiratory: Negative for cough, wheezing and stridor. ; ; Gastrointestinal: Negative for nausea, vomiting, diarrhea, abdominal pain, blood in stool, hematemesis, jaundice and rectal bleeding. . ; ; Genitourinary: Negative for dysuria, flank pain and hematuria. ; ; Musculoskeletal: +left shoulder, LBP. Negative for neck pain. Negative for swelling and deformity.; ; Skin: Negative for pruritus, rash, abrasions,  blisters, bruising and skin lesion.; ; Neuro: +generalized weakness. Negative for headache, lightheadedness and neck stiffness. Negative for altered level of consciousness , altered mental status, extremity weakness, paresthesias, involuntary movement, seizure and syncope.       Allergies  Review of patient's allergies indicates no known allergies.  Home Medications   Current Outpatient Rx  Name  Route  Sig  Dispense  Refill  . BUSPIRONE HCL 10 MG PO TABS      Take one half of a tab in the am and one at night.   60 tablet   6   . CALCARB 600/D PO   Oral   Take by mouth 3 (three) times daily.          Marland Kitchen LEVOTHYROXINE SODIUM 25 MCG PO TABS   Oral   Take 1 tablet (25 mcg total) by mouth daily.   30 tablet   6   . LISINOPRIL 20 MG PO TABS   Oral   Take 20 mg by mouth daily.         Marland Kitchen OMEPRAZOLE 40 MG PO CPDR   Oral   Take 1 capsule (40 mg total) by mouth daily.   30 capsule   11   . SIMVASTATIN 10 MG PO TABS   Oral   Take 1 tablet (10 mg total) by mouth at bedtime.   90 tablet   4   . TRAMADOL HCL  50 MG PO TABS      TAKE 1 TABLET BY MOUTH TWICE DAILY AS NEEDED FOR PAIN   60 tablet   0     BP 147/67  Pulse 89  Temp 97.8 F (36.6 C) (Oral)  SpO2 97%  Physical Exam 1805: Physical examination:  Nursing notes reviewed; Vital signs and O2 SAT reviewed;  Constitutional: Well developed, Well nourished, Well hydrated, In no acute distress; Head:  Normocephalic, atraumatic; Eyes: EOMI, PERRL, No scleral icterus; ENMT: Mouth and pharynx normal, Mucous membranes moist; Neck: Supple, Full range of motion, No lymphadenopathy; Cardiovascular: Regular rate and rhythm, No gallop; Respiratory: Breath sounds clear & equal bilaterally, No rales, rhonchi, wheezes.  Speaking full sentences with ease, Normal respiratory effort/excursion; Chest: Nontender, Movement normal; Abdomen: Soft, Nontender, Nondistended, Normal bowel sounds; Genitourinary: No CVA tenderness; Spine:  No  midline CS, TS, LS tenderness.  +TTP left thoracic and lumbar paraspinal muscles, no rash.;; Extremities: Pulses normal, Pelvis stable. +left shoulder w/FROM.  +generalized TTP entire left shoulder without specific area of point tenderness.  Left clavicle NT, scapula NT, proximal humerus NT, biceps tendon NT over bicipital groove.  Motor strength at shoulder normal.  Sensation intact over deltoid region, distal NMS intact with left hand having intact and equal sensation and strength in the distribution of the median, radial, and ulnar nerve function to right side.  Strong radial pulse.  +FROM left elbow with intact motor strength biceps and triceps muscles to resistance.  No deformity, no erythema, no ecchymosis, no edema.  No calf edema or asymmetry.; Neuro: AA&Ox3, Major CN grossly intact.  Speech clear. No facial droop. No gross focal motor or sensory deficits in extremities.; Skin: Color normal, Warm, Dry.   ED Course  Procedures   MDM  MDM Reviewed: nursing note, vitals and previous chart Reviewed previous: ECG and labs Interpretation: ECG, labs, x-ray and CT scan      Date: 05/31/2012  Rate: 81  Rhythm: normal sinus rhythm  QRS Axis: normal  Intervals: PR prolonged  ST/T Wave abnormalities: normal  Conduction Disutrbances:first-degree A-V block   Narrative Interpretation:   Old EKG Reviewed: changes noted; 1st degree AVB new since previous EKG dated 03/24/2010.   Results for orders placed during the hospital encounter of 05/31/12  URINALYSIS, ROUTINE W REFLEX MICROSCOPIC      Component Value Range   Color, Urine YELLOW  YELLOW   APPearance CLOUDY (*) CLEAR   Specific Gravity, Urine 1.006  1.005 - 1.030   pH 6.5  5.0 - 8.0   Glucose, UA NEGATIVE  NEGATIVE mg/dL   Hgb urine dipstick NEGATIVE  NEGATIVE   Bilirubin Urine NEGATIVE  NEGATIVE   Ketones, ur NEGATIVE  NEGATIVE mg/dL   Protein, ur NEGATIVE  NEGATIVE mg/dL   Urobilinogen, UA 0.2  0.0 - 1.0 mg/dL   Nitrite NEGATIVE   NEGATIVE   Leukocytes, UA TRACE (*) NEGATIVE  BASIC METABOLIC PANEL      Component Value Range   Sodium 138  135 - 145 mEq/L   Potassium 4.5  3.5 - 5.1 mEq/L   Chloride 103  96 - 112 mEq/L   CO2 25  19 - 32 mEq/L   Glucose, Bld 95  70 - 99 mg/dL   BUN 18  6 - 23 mg/dL   Creatinine, Ser 9.60  0.50 - 1.10 mg/dL   Calcium 9.1  8.4 - 45.4 mg/dL   GFR calc non Af Amer 68 (*) >90 mL/min   GFR calc Af Denyse Dago  79 (*) >90 mL/min  CBC WITH DIFFERENTIAL      Component Value Range   WBC 8.3  4.0 - 10.5 K/uL   RBC 4.48  3.87 - 5.11 MIL/uL   Hemoglobin 12.8  12.0 - 15.0 g/dL   HCT 40.9  81.1 - 91.4 %   MCV 90.8  78.0 - 100.0 fL   MCH 28.6  26.0 - 34.0 pg   MCHC 31.4  30.0 - 36.0 g/dL   RDW 78.2  95.6 - 21.3 %   Platelets 208  150 - 400 K/uL   Neutrophils Relative 58  43 - 77 %   Neutro Abs 4.8  1.7 - 7.7 K/uL   Lymphocytes Relative 26  12 - 46 %   Lymphs Abs 2.1  0.7 - 4.0 K/uL   Monocytes Relative 14 (*) 3 - 12 %   Monocytes Absolute 1.1 (*) 0.1 - 1.0 K/uL   Eosinophils Relative 2  0 - 5 %   Eosinophils Absolute 0.2  0.0 - 0.7 K/uL   Basophils Relative 1  0 - 1 %   Basophils Absolute 0.0  0.0 - 0.1 K/uL  LACTIC ACID, PLASMA      Component Value Range   Lactic Acid, Venous 0.9  0.5 - 2.2 mmol/L  TROPONIN I      Component Value Range   Troponin I <0.30  <0.30 ng/mL  URINE MICROSCOPIC-ADD ON      Component Value Range   Squamous Epithelial / LPF FEW (*) RARE   WBC, UA 0-2  <3 WBC/hpf   Bacteria, UA FEW (*) RARE   Dg Chest 2 View 05/31/2012  *RADIOLOGY REPORT*  Clinical Data: Fall.  Chest pain.  CHEST - 2 VIEW  Comparison: Two-view chest 08/28/2010.  Findings: The heart is mildly enlarged.  There is no edema or effusion to suggest failure.  Exaggerated kyphosis is again noted in the thoracic spine.  Emphysematous changes are present.  IMPRESSION:  1.  No acute cardiopulmonary disease or significant interval change.   Original Report Authenticated By: Marin Roberts, M.D.    Dg  Lumbar Spine Complete 05/31/2012  *RADIOLOGY REPORT*  Clinical Data: Fall.  Low back pain.  LUMBAR SPINE - COMPLETE 4+ VIEW  Comparison: CT abdomen pelvis 05/28/2010.  Findings: Five non-rib bearing lumbar type vertebral bodies are present.  Degenerative facet changes are present at L5-S1 with stable slight anterolisthesis. Atherosclerotic calcifications are present in the aorta.  IMPRESSION:  1.  Degenerative changes of the lumbar spine, particularly at L5- S1. 2.  No acute abnormality.   Original Report Authenticated By: Marin Roberts, M.D.    Dg Hip Complete Left 05/31/2012  *RADIOLOGY REPORT*  Clinical Data: Fall.  Left hip pain.  LEFT HIP - COMPLETE 2+ VIEW  Comparison: None.  Findings: Mild osteopenia is present.  The left hip is located.  No acute bone or soft tissue abnormalities present.  Degenerative changes are present.  Atherosclerotic calcifications are noted in the femoral artery. Pelvis is intact.  Degenerative changes are noted in the lower lumbar spine.  IMPRESSION:  1.  No acute abnormality. 2.  Degenerative changes of the left hip. 3.  Osteopenia.   Original Report Authenticated By: Marin Roberts, M.D.    Ct Head Wo Contrast 05/31/2012  *RADIOLOGY REPORT*  Clinical Data: Multiple falls.  CT HEAD WITHOUT CONTRAST  Technique:  Contiguous axial images were obtained from the base of the skull through the vertex without contrast.  Comparison: MRI 02/10/2009  Findings: Brain  shows mild generalized atrophy.  No sign of focal old or acute infarction, mass lesion, hemorrhage, hydrocephalus or extra-axial collection.  Sinuses are clear.  Mastoids are largely undeveloped.  No fluid in the middle ears.  IMPRESSION: No acute or traumatic finding.  Age related atrophy.   Original Report Authenticated By: Paulina Fusi, M.D.    Dg Shoulder Left 05/31/2012  *RADIOLOGY REPORT*  Clinical Data: Fall with left shoulder pain.  LEFT SHOULDER - 2+ VIEW  Comparison: None  Findings: There is no evidence of  fracture, subluxation or dislocation. Diffuse osteopenia is present. Mild degenerative changes at the glenohumeral joint noted. The visualized left bony thorax is unremarkable.  IMPRESSION: No evidence of acute bony abnormality.   Original Report Authenticated By: Harmon Pier, M.D.      2305:  Pt stood unsteadily with orthostatic VS.  Pt unable to walk with heavy assist x2 + holding on to walker.  No family in ED with pt. Very concerned over pt's safety at home: needs PT, Home Health, Social Work/Case Management consults.  T/C to [facility] Resident Dr. Dierdre Searles, case discussed, including:  HPI, pertinent PM/SHx, VS/PE, dx testing, ED course and treatment:  Agreeable to observation admit, requests to write temporary orders, obtain medical bed to Dr. Margarito Liner.          Laray Anger, DO 06/02/12 1433

## 2012-05-31 NOTE — ED Notes (Signed)
Pt attempted to move couch two weeks ago and since then has been having shoulder and back pain.

## 2012-06-01 ENCOUNTER — Observation Stay (HOSPITAL_COMMUNITY): Payer: Medicare Other

## 2012-06-01 ENCOUNTER — Encounter (HOSPITAL_COMMUNITY): Payer: Self-pay | Admitting: Internal Medicine

## 2012-06-01 DIAGNOSIS — S99929A Unspecified injury of unspecified foot, initial encounter: Secondary | ICD-10-CM | POA: Diagnosis not present

## 2012-06-01 DIAGNOSIS — W19XXXA Unspecified fall, initial encounter: Secondary | ICD-10-CM | POA: Diagnosis present

## 2012-06-01 DIAGNOSIS — M25569 Pain in unspecified knee: Secondary | ICD-10-CM | POA: Diagnosis not present

## 2012-06-01 DIAGNOSIS — S8990XA Unspecified injury of unspecified lower leg, initial encounter: Secondary | ICD-10-CM | POA: Diagnosis not present

## 2012-06-01 DIAGNOSIS — R531 Weakness: Secondary | ICD-10-CM | POA: Diagnosis present

## 2012-06-01 LAB — CBC
HCT: 37.4 % (ref 36.0–46.0)
Hemoglobin: 11.8 g/dL — ABNORMAL LOW (ref 12.0–15.0)
MCH: 28.8 pg (ref 26.0–34.0)
MCHC: 31.6 g/dL (ref 30.0–36.0)
MCV: 91.2 fL (ref 78.0–100.0)
RBC: 4.1 MIL/uL (ref 3.87–5.11)

## 2012-06-01 LAB — BASIC METABOLIC PANEL
BUN: 15 mg/dL (ref 6–23)
CO2: 24 mEq/L (ref 19–32)
Calcium: 8.8 mg/dL (ref 8.4–10.5)
Creatinine, Ser: 0.83 mg/dL (ref 0.50–1.10)
GFR calc non Af Amer: 68 mL/min — ABNORMAL LOW (ref >90)
Glucose, Bld: 91 mg/dL (ref 70–99)

## 2012-06-01 LAB — HEPATIC FUNCTION PANEL
ALT: 9 U/L (ref 0–35)
AST: 17 U/L (ref 0–37)
Albumin: 3.1 g/dL — ABNORMAL LOW (ref 3.5–5.2)
Bilirubin, Direct: 0.1 mg/dL (ref 0.0–0.3)
Total Protein: 6.6 g/dL (ref 6.0–8.3)

## 2012-06-01 MED ORDER — ACETAMINOPHEN 650 MG RE SUPP: 650.0000 mg | Freq: Four times a day (QID) | RECTAL | Status: DC | PRN

## 2012-06-01 MED ORDER — TRAMADOL HCL 50 MG PO TABS: 50.0000 mg | ORAL_TABLET | Freq: Two times a day (BID) | ORAL | Status: DC | PRN

## 2012-06-01 MED ORDER — ONDANSETRON HCL 4 MG/2ML IJ SOLN: 4.0000 mg | Freq: Three times a day (TID) | INTRAMUSCULAR | Status: DC | PRN

## 2012-06-01 MED ORDER — SIMVASTATIN 10 MG PO TABS
10.0000 mg | ORAL_TABLET | Freq: Every day | ORAL | Status: DC
  Filled 2012-06-01: qty 1

## 2012-06-01 MED ORDER — ACETAMINOPHEN 325 MG PO TABS: 650.0000 mg | ORAL_TABLET | Freq: Four times a day (QID) | ORAL | Status: DC | PRN

## 2012-06-01 MED ORDER — LISINOPRIL 10 MG PO TABS
10.0000 mg | ORAL_TABLET | Freq: Every day | ORAL | Status: DC
  Administered 2012-06-01: 10 mg via ORAL
  Filled 2012-06-01: qty 1

## 2012-06-01 MED ORDER — LEVOTHYROXINE SODIUM 25 MCG PO TABS
25.0000 ug | ORAL_TABLET | Freq: Every day | ORAL | Status: DC
  Filled 2012-06-01 (×2): qty 1

## 2012-06-01 MED ORDER — SODIUM CHLORIDE 0.9 % IV SOLN: 250.0000 mL | INTRAVENOUS | Status: DC | PRN

## 2012-06-01 MED ORDER — PANTOPRAZOLE SODIUM 40 MG PO TBEC
40.0000 mg | DELAYED_RELEASE_TABLET | Freq: Every day | ORAL | Status: DC
  Administered 2012-06-01: 40 mg via ORAL
  Filled 2012-06-01: qty 1

## 2012-06-01 MED ORDER — SODIUM CHLORIDE 0.9 % IV SOLN: INTRAVENOUS | Status: DC

## 2012-06-01 MED ORDER — BUSPIRONE HCL 10 MG PO TABS
10.0000 mg | ORAL_TABLET | Freq: Every day | ORAL | Status: DC
  Filled 2012-06-01: qty 1

## 2012-06-01 MED ORDER — SODIUM CHLORIDE 0.9 % IJ SOLN: 3.0000 mL | INTRAMUSCULAR | Status: DC | PRN

## 2012-06-01 MED ORDER — SODIUM CHLORIDE 0.9 % IJ SOLN: 3.0000 mL | Freq: Two times a day (BID) | INTRAMUSCULAR | Status: DC

## 2012-06-01 MED ORDER — BUSPIRONE HCL 5 MG PO TABS
5.0000 mg | ORAL_TABLET | Freq: Every day | ORAL | Status: DC
  Administered 2012-06-01: 5 mg via ORAL
  Filled 2012-06-01: qty 1

## 2012-06-01 MED ORDER — CALCIUM CARBONATE-VITAMIN D 500-200 MG-UNIT PO TABS
1.0000 | ORAL_TABLET | Freq: Three times a day (TID) | ORAL | Status: DC
  Administered 2012-06-01: 1 via ORAL
  Filled 2012-06-01 (×5): qty 1

## 2012-06-01 NOTE — H&P (Signed)
Internal Medicine Attending Admission Note Date: 06/01/2012  Patient name: Lisa Haynes Medical record number: [id number] Date of birth: 12/22/1940 Age: 72 y.o. Gender: female  I saw and evaluated the patient. I reviewed the resident's note and I agree with the resident's findings and plan as documented in the resident's note, with following additional comments.  Chief Complaint(s): Back pain and left leg pain  History - key components related to admission: Patient is a 72 year old woman with history of osteoarthritis, hypertension, and other problems as outlined in the medical history, admitted with complaint of back pain and left leg pain.  Patient reports that she fell off her couch at home a few days prior to admission; she denies any loss of consciousness.  Physical Exam - key components related to admission:  Filed Vitals:   06/01/12 0129 06/01/12 0135 06/01/12 0355 06/01/12 0628  BP: 137/50 125/54 152/69 145/70  Pulse: 78  91 88  Temp:   98.6 F (37 C) 98.5 F (36.9 C)  TempSrc:   Oral Oral  Resp:   18 18  SpO2: 95%  99% 98%    General: Alert, no distress Lungs: Clear Heart: Regular; no extra sounds or murmurs Abdomen: Bowel sounds present, soft, nontender Extremities: No significant tenderness of left lower extremity  Lab results:   Basic Metabolic Panel:  Basename 06/01/12 0635 05/31/12 1900  NA 139 138  K 4.0 4.5  CL 106 103  CO2 24 25  GLUCOSE 91 95  BUN 15 18  CREATININE 0.83 0.83  CALCIUM 8.8 9.1  MG -- --  PHOS -- --   Liver Function Tests:  [facility] 06/01/12 0635  AST 17  ALT 9  ALKPHOS 63  BILITOT 0.5  PROT 6.6  ALBUMIN 3.1*    CBC:  Basename 06/01/12 0635 05/31/12 1900  WBC 7.4 8.3  NEUTROABS -- 4.8  HGB 11.8* 12.8  HCT 37.4 40.7  MCV 91.2 90.8  PLT 191 208    Cardiac Enzymes:  Basename 05/31/12 1900  CKTOTAL --  CKMB --  CKMBINDEX --  TROPONINI <0.30    Urinalysis    Component Value Date/Time   COLORURINE YELLOW  05/31/2012 1844   APPEARANCEUR CLOUDY* 05/31/2012 1844   LABSPEC 1.006 05/31/2012 1844   PHURINE 6.5 05/31/2012 1844   GLUCOSEU NEGATIVE 05/31/2012 1844   HGBUR NEGATIVE 05/31/2012 1844   BILIRUBINUR NEGATIVE 05/31/2012 1844   KETONESUR NEGATIVE 05/31/2012 1844   PROTEINUR NEGATIVE 05/31/2012 1844   UROBILINOGEN 0.2 05/31/2012 1844   NITRITE NEGATIVE 05/31/2012 1844   LEUKOCYTESUR TRACE* 05/31/2012 1844    Urine microscopic: 0-2 WBCs, few squamous epithelial, few bacteria    Imaging results:  Dg Chest 2 View  05/31/2012  *RADIOLOGY REPORT*  Clinical Data: Fall.  Chest pain.  CHEST - 2 VIEW  Comparison: Two-view chest 08/28/2010.  Findings: The heart is mildly enlarged.  There is no edema or effusion to suggest failure.  Exaggerated kyphosis is again noted in the thoracic spine.  Emphysematous changes are present.  IMPRESSION:  1.  No acute cardiopulmonary disease or significant interval change.   Original Report Authenticated By: Marin Roberts, M.D.    Dg Lumbar Spine Complete  05/31/2012  *RADIOLOGY REPORT*  Clinical Data: Fall.  Low back pain.  LUMBAR SPINE - COMPLETE 4+ VIEW  Comparison: CT abdomen pelvis 05/28/2010.  Findings: Five non-rib bearing lumbar type vertebral bodies are present.  Degenerative facet changes are present at L5-S1 with stable slight anterolisthesis. Atherosclerotic calcifications are present in the aorta.  IMPRESSION:  1.  Degenerative changes of the lumbar spine, particularly at L5- S1. 2.  No acute abnormality.   Original Report Authenticated By: Marin Roberts, M.D.    Dg Hip Complete Left  05/31/2012  *RADIOLOGY REPORT*  Clinical Data: Fall.  Left hip pain.  LEFT HIP - COMPLETE 2+ VIEW  Comparison: None.  Findings: Mild osteopenia is present.  The left hip is located.  No acute bone or soft tissue abnormalities present.  Degenerative changes are present.  Atherosclerotic calcifications are noted in the femoral artery. Pelvis is intact.  Degenerative changes are  noted in the lower lumbar spine.  IMPRESSION:  1.  No acute abnormality. 2.  Degenerative changes of the left hip. 3.  Osteopenia.   Original Report Authenticated By: Marin Roberts, M.D.    Ct Head Wo Contrast  05/31/2012  *RADIOLOGY REPORT*  Clinical Data: Multiple falls.  CT HEAD WITHOUT CONTRAST  Technique:  Contiguous axial images were obtained from the base of the skull through the vertex without contrast.  Comparison: MRI 02/10/2009  Findings: Brain shows mild generalized atrophy.  No sign of focal old or acute infarction, mass lesion, hemorrhage, hydrocephalus or extra-axial collection.  Sinuses are clear.  Mastoids are largely undeveloped.  No fluid in the middle ears.  IMPRESSION: No acute or traumatic finding.  Age related atrophy.   Original Report Authenticated By: Paulina Fusi, M.D.    Dg Shoulder Left  05/31/2012  *RADIOLOGY REPORT*  Clinical Data: Fall with left shoulder pain.  LEFT SHOULDER - 2+ VIEW  Comparison: None  Findings: There is no evidence of fracture, subluxation or dislocation. Diffuse osteopenia is present. Mild degenerative changes at the glenohumeral joint noted. The visualized left bony thorax is unremarkable.  IMPRESSION: No evidence of acute bony abnormality.   Original Report Authenticated By: Harmon Pier, M.D.    Dg Knee Complete 4 Views Left  06/01/2012  *RADIOLOGY REPORT*  Clinical Data: Fall.  Knee pain.  LEFT KNEE - COMPLETE 4+ VIEW  Comparison: None.  Findings: There is mild lateral subluxation of the tibia relative to the femur.  This is chronic and degenerative, associated with tricompartmental osteoarthritis. Marginal osteophytes are present in all three compartments.  There is no joint effusion. No fracture is present.  Atherosclerosis.  IMPRESSION: Moderate to severe tricompartmental osteoarthritis.  No acute osseous abnormality.   Original Report Authenticated By: Andreas Newport, M.D.     Other results: EKG: normal sinus rhythm; normal  EKG   Assessment & Plan by Problem:  1.  Back pain and left lower extremity pain.  Patient reports improvement in symptoms overnight, and currently her pain is well controlled on Tramadol.  X-rays showed no evidence of fracture or dislocation.  Plan is continue tramadol as needed for pain.  2.  Falls.  Patient denies any loss of consciousness; she was not orthostatic.  Physical therapy has seen patient and recommends home health physical therapy and a rolling walker.  Patient is comfortable with plan for home physical therapy and outpatient clinic follow-up.  3.  Other problems as per the resident physician's note.

## 2012-06-01 NOTE — H&P (Signed)
Hospital Admission Note Date: 06/01/2012  Patient name: Lisa Haynes Medical record number: [id number] Date of birth: January 19, 1941 Age: 72 y.o. Gender: female PCP: Tacey Heap, MD   Service:  Internal Medicine Teaching Service   Attending Physician:  Dr. Margarito Liner    Chief Complaint:  Back pain and left leg pain     History of Present Illness:  72 year old woman with osteoarthritis and hypertension who went to the [facility] long emergency department this evening because of back pain, hip pain, and left leg pain. These ailments began last week when she fell off of her couch. She hit her left leg on the coffee table when she fell. She did not lose consciousness. Since that injury, the pain has been very mild and bearable; but 2 days ago, the pain began to worsen. It has worsened to the point that, when at its worst, she feels short of breath and nauseated. She even vomited once yesterday. This severe pain prompted her visit to the emergency department. She lives at home and sleeps on the couch. She is still able to ambulate, but with great difficulty. Ambulation makes her pain much worse. She falls frequently. With one of her falls recently, she hit her head and has been having headaches since. She reports occasional orthostatic dizziness but no syncope. She is getting over a recent cold; she still has a post viral cough that is producing clear/white sputum. She has no dyspnea except when her back pain becomes severe. She has no chest pain or palpitations. She denies dysuria and constipation. She does have diarrhea about twice a week she says. No blood in her urine or stool.     Review of Systems:   Constitutional: Negative.  Negative for fever and chills.  HENT: Negative for congestion and sore throat.   Eyes: Negative.   Respiratory: Positive for cough, sputum production (clear/white) and shortness of breath (only with severe pain).   Cardiovascular: Negative for chest pain, palpitations and  orthopnea.  Gastrointestinal: Positive for nausea (with pain), vomiting (x1) and diarrhea (chronic issue). Negative for abdominal pain, constipation and blood in stool.  Genitourinary: Negative.  Negative for dysuria and hematuria.  Musculoskeletal: Positive for back pain, joint pain and falls.  Skin: Negative.  Negative for rash.  Neurological: Positive for dizziness (orthostatic) and headaches. Negative for focal weakness and loss of consciousness.  Endo/Heme/Allergies: Negative.  Does not bruise/bleed easily.     Medical History: Past Medical History  Diagnosis Date  . Bilateral knee pain   . Depression   . Hypertension   . Low back pain   . Osteoarthritis   . Osteoporosis     Surgical History: Past Surgical History  Procedure Date  . Abdominal hysterectomy     with unilateral oophorectomy    Home Medications: 1. Buspirone 5 mg in the morning and 10 mg at night 2. Calcium carbonate-vitamin D, one tablet 3 times a day 3. Levothyroxine 25 mcg daily 4. Lisinopril 10 mg daily 5. Omeprazole 40 mg daily 6. Simvastatin 10 mg at bedtime 7. Tramadol 50 mg twice a day as needed for pain   Allergies: Allergies as of 05/31/2012  . (No Known Allergies)    Family History: History reviewed. No pertinent family history.  Social History: Social History  . Marital Status: Legally Separated    Spouse Name: N/A    Number of Children: N/A  . Years of Education: N/A   Occupational History  . Retired    Social History Main  Topics  . Smoking status: Never Smoker   . Smokeless tobacco: Not on file  . Alcohol Use: No  . Drug Use: No  . Sexually Active: Not on file   Social History Narrative   Retired: Motel workDivorced Regular exercise- noLives alone     Physical exam: Filed Vitals:   06/01/12 0355  BP: 152/69  Pulse: 91  Temp: 98.6 F (37 C)  TempSrc: Oral  Resp: 18  SpO2: 99%    GENERAL: morbidly obese; no acute distress HEAD: atraumatic,  normocephalic EYES: pupils equal, round and reactive; sclera anicteric; normal conjunctiva NOSE/THROAT: oropharynx clear, moist mucous membranes, pink gums NECK: supple, no carotid bruits, thyroid normal in size and without palpable nodules LYMPH: no cervical or supraclavicular lymphadenopathy LUNGS: clear to auscultation bilaterally, normal work of breathing HEART: normal rate and regular rhythm; normal S1 and S2 without S3 or S4; no murmurs, rubs, or clicks PULSES: radial and dorsalis pedis pulses are 2+ and symmetric ABDOMEN: soft, non-tender, normal bowel sounds MSK: Able to move all 4 extremities, back pain exacerbated by all movements of the back and lower extremities, pain in left leg is just superior to the joint, tender with palpation of the distal femur/thigh MOTOR: 5 out of 5 dorsiflexion and plantarflexion bilaterally SENSATION: Intact distally SKIN: warm, dry, intact, normal turgor, no rashes EXTREMITIES: 1+ pitting edema in the lower extremities, no clubbing or cyanosis  PSYCH: patient is alert and oriented, mood and affect are normal and congruent, thought content is normal without delusions, thought process is linear, speech is normal and non-pressured, behavior is normal     Lab results: Basic Metabolic Panel:  Basename 05/31/12 1900  NA 138  K 4.5  CL 103  CO2 25  GLUCOSE 95  BUN 18  CREATININE 0.83  CALCIUM 9.1  MG --  PHOS --    CBC: Basename 05/31/12 1900  WBC 8.3  HGB 12.8  HCT 40.7  MCV 90.8  PLT 208  Neutrophils, %  58  Lymphocytes, %  26  Monocytes, %  14*  Eosinophils, %  2  Basophils, %  1  Neutrophils  4.8  Lymphocytes  2.1  Monocytes  1.1*  Eosinophils  0.2  Basophils  0.0    Cardiac Enzymes:  Basename 05/31/12 1900  CKTOTAL --  CKMB --  CKMBINDEX --  TROPONINI <0.30    Urinalysis: Basename 05/31/12 1844  COLORURINE YELLOW  LABSPEC 1.006  PHURINE 6.5  GLUCOSEU NEGATIVE  HGBUR NEGATIVE  BILIRUBINUR NEGATIVE  KETONESUR  NEGATIVE  PROTEINUR NEGATIVE  UROBILINOGEN 0.2  NITRITE NEGATIVE  LEUKOCYTESUR TRACE*  WBC  0-2  SQUAM EPITHEL FEW*  BACTERIA FEW*    Misc. Labs: [facility] 05/31/12 1844  LACTATE 0.9    Imaging results: Dg Chest 2 View 05/31/2012 FINDINGS: The heart is mildly enlarged.  There is no edema or effusion to suggest failure.  Exaggerated kyphosis is again noted in the thoracic spine.  Emphysematous changes are present.  IMPRESSION:  1.  No acute cardiopulmonary disease or significant interval change.  Dg Lumbar Spine Complete 05/31/2012   FINDINGS: Five non-rib bearing lumbar type vertebral bodies are present.  Degenerative facet changes are present at L5-S1 with stable slight anterolisthesis. Atherosclerotic calcifications are present in the aorta. IMPRESSION:  1.  Degenerative changes of the lumbar spine, particularly at L5- S1. 2.  No acute abnormality.  Dg Hip Complete Left 05/31/2012   FINDINGS: Mild osteopenia is present.  The left hip is located.  No acute bone  or soft tissue abnormalities present.  Degenerative changes are present.  Atherosclerotic calcifications are noted in the femoral artery. Pelvis is intact.  Degenerative changes are noted in the lower lumbar spine.   IMPRESSION:  1.  No acute abnormality. 2.  Degenerative changes of the left hip. 3.  Osteopenia.  Ct Head Wo Contrast 05/31/2012  FINDINGS: Brain shows mild generalized atrophy.  No sign of focal old or acute infarction, mass lesion, hemorrhage, hydrocephalus or extra-axial collection.  Sinuses are clear.  Mastoids are largely undeveloped.  No fluid in the middle ears.   IMPRESSION: No acute or traumatic finding.  Age related atrophy.  Dg Shoulder Left 05/31/2012  FINDINGS: There is no evidence of fracture, subluxation or dislocation. Diffuse osteopenia is present. Mild degenerative changes at the glenohumeral joint noted. The visualized left bony thorax is unremarkable. IMPRESSION: No evidence of acute bony  abnormality.    Other results: EKG Results:  06/01/2012 Rate:  81 PR:  204 QRS:  68 QTc:  427 Axis:  Normal EKG:  Normal sinus rhythm, normal EKG, borderline A-V conduction delay.   Assessment and Plan:  1.   Falls:  Is likely a result of deconditioning. She does report orthostatic dizziness but never had syncope. Her falls have resulted in musculoskeletal injury causing her pain. No evidence of fracture on imaging. She needs PT and OT to evaluate her. - PT/OT eval and treat - Orthostatic vital signs  2.   Back pain:  Degenerative disease demonstrated on radiography. No evidence of radiculopathy or myelopathy on exam. She may have a herniated disc as a sequela of her recent falls, but no injury that requires further advanced imaging or interventions. - Physical therapy as above - Tramadol 50 mg twice a day as needed  3.   Leg pain:  Left thigh pain just superior to the joint secondary to the mechanical injury. Hit her leg on a coffee table after falling last week. No evidence of fracture on imaging. No visible ecchymosis or swelling on exam. Mild tenderness with palpation of the thigh. - Physical therapy as above  4.   Morbid obesity:  BMI 51 most recent check. Undoubtedly contributing to deconditioning and weakness, which has resulted in these frequent falls.  5.   Osteoporosis:  Osteopenia on imaging. Bone density imaging from 2006 shows lumbar spine T score of -3.1 and left hip T score of -1.9, diagnostic for osteoporosis. She takes vitamin D and calcium daily. - Continue vitamin D and calcium supplementation  6.   Hypertension:  At home, she takes lisinopril 10 mg daily. Low pressures over the past year: 166-124/78-63. Average blood pressure seems to be around 140/75. - Continue lisinopril 10 mg daily  7.   Hyperlipidemia:  At home, she takes simvastatin 10 mg at bedtime. Last lipid panel was March 2013: cholesterol 162, triglycerides 97, HDL 56, and LDL 87.  - Continue  simvastatin 10 mg at bedtime   8.   Hypothyroidisim:  At home, she takes levothyroxine 25 mcg daily. TSH has remained stable over the past 2 years. Last check, it was 2.8 07/08/2011. No symptoms of hyper or hypothyroidism.  - Continue levothyroxine 25 mcg daily   9.   Prophylaxis:  SCDs   10. Disposition:  Patient's PCP was Dr. Lonzo Cloud.  Patient will OPC followup. Expected length of stay less than 2 days. Outpatient services pending OT and PT evaluation.     Signed by:  Dorthula Rue. Earlene Plater, MD PGY-I, Internal Medicine  06/01/2012,  5:53 AM

## 2012-06-01 NOTE — Progress Notes (Signed)
Utilization review completed. Brucha Ahlquist, RN, BSN. 

## 2012-06-01 NOTE — Progress Notes (Signed)
CARE MANAGEMENT NOTE 06/01/2012  Patient:  Lisa Haynes, Lisa Haynes   Account Number:  [id number]  Date Initiated:  06/01/2012  Documentation initiated by:  Vance Peper  Subjective/Objective Assessment:   72 yr old female admitted for back and shoulder pain     Action/Plan:   CM spoke with patient concerning home health needs and DME. Choice offered. patient lives with her son.   Anticipated DC Date:  06/01/2012   Anticipated DC Plan:  HOME W HOME HEALTH SERVICES      DC Planning Services  CM consult      PAC Choice  DURABLE MEDICAL EQUIPMENT  HOME HEALTH   Choice offered to / List presented to:  C-1 Patient   DME arranged  3-N-1  WALKER - ROLLING        HH arranged  HH-2 PT  HH-3 OT  HH-4 NURSE'S AIDE      HH agency  [facility]   Status of service:  Completed, signed off Medicare Important Message given?   (If response is "NO", the following Medicare IM given date fields will be blank) Date Medicare IM given:   Date Additional Medicare IM given:    Discharge Disposition:  HOME W HOME HEALTH SERVICES  Per UR Regulation:    If discussed at Long Length of Stay Meetings, dates discussed:    Comments:

## 2012-06-01 NOTE — Progress Notes (Signed)
Pt provided with discharge instructions and follow up appointment information. She has equipment for home and will have HHPT/OT/aide. She is going home with her son.

## 2012-06-01 NOTE — Evaluation (Signed)
Physical Therapy Evaluation Patient Details Name: Lisa Haynes MRN: [id number] DOB: June 02, 1940 Today's Date: 06/01/2012 Time: 4098-1191 PT Time Calculation (min): 21 min  PT Assessment / Plan / Recommendation Clinical Impression  72 y.o. female who last week fell out of couch where she sleeps and was admitted with back, hip, LLE pain. All imaging negative for acute abnormalities. Pt lives with her son, who isn't home at all times, he is looking for work per pt. Pt not open to [facility].  Pt would benefit from new RW, 3-in1 and HHPT.     PT Assessment  All further PT needs can be met in the next venue of care    Follow Up Recommendations  Home health PT    Does the patient have the potential to tolerate intense rehabilitation      Barriers to Discharge        Equipment Recommendations  Rolling walker with 5" wheels (3-in-1)    Recommendations for Other Services     Frequency      Precautions / Restrictions Precautions Precautions: None Restrictions Weight Bearing Restrictions: Yes LLE Weight Bearing: Weight bearing as tolerated   Pertinent Vitals/Pain *2-3/10 back pain with walking Rest**      Mobility  Bed Mobility Bed Mobility: Supine to Sit Supine to Sit: HOB elevated;4: Min assist Details for Bed Mobility Assistance: labored, increased time, HOB @ 15*, min A to elevate trunk Transfers Transfers: Sit to Stand;Stand to Sit Sit to Stand: From bed;4: Min guard Stand to Sit: 4: Min guard;To chair/3-in-1;With armrests Details for Transfer Assistance: VCs hand placement Ambulation/Gait Ambulation/Gait Assistance: 5: Supervision Ambulation Distance (Feet): 60 Feet Assistive device: Rolling walker Gait Pattern: Within Functional Limits Gait velocity: decreased General Gait Details: distance limited by pain/fatigue    Shoulder Instructions     Exercises     PT Diagnosis:    PT Problem List: Decreased activity tolerance;Pain;Decreased mobility PT Treatment  Interventions:     PT Goals    Visit Information  Last PT Received On: 06/01/12 Assistance Needed: +1    Subjective Data  Subjective: I don't get out of the house much.  Patient Stated Goal: to get stronger   Prior Functioning  Home Living Lives With: Son Available Help at Discharge: Available PRN/intermittently (son is gone a lot looking for a job) Type of Home: House Home Access: Stairs to enter Entrance Stairs-Rails: None Home Layout: One level Home Adaptive Equipment: Shower chair without back;Walker - rolling Additional Comments: Pt reports her walker is falling apart and they try to put it back together with super glue Prior Function Level of Independence: Needs assistance Needs Assistance: Bathing;Light Housekeeping Comments: pt has aide that assists with bathing Communication Communication: No difficulties    Cognition  Overall Cognitive Status: Appears within functional limits for tasks assessed/performed Arousal/Alertness: Awake/alert Orientation Level: Appears intact for tasks assessed Behavior During Session: [facility] for tasks performed    Extremity/Trunk Assessment Right Upper Extremity Assessment RUE ROM/Strength/Tone: [facility] for tasks assessed Left Upper Extremity Assessment LUE ROM/Strength/Tone: WFL for tasks assessed Right Lower Extremity Assessment RLE ROM/Strength/Tone: Within functional levels RLE Sensation: WFL - Light Touch RLE Coordination: WFL - gross/fine motor Left Lower Extremity Assessment LLE ROM/Strength/Tone: Within functional levels LLE Sensation: WFL - Light Touch LLE Coordination: WFL - gross/fine motor Trunk Assessment Trunk Assessment: Normal   Balance Balance Balance Assessed: Yes Static Sitting Balance Static Sitting - Balance Support: No upper extremity supported;Feet supported Static Sitting - Level of Assistance: 6: Modified independent (Device/Increase time)  Static Sitting - Comment/# of Minutes: 4  End of Session PT - End of  Session Activity Tolerance: Patient limited by pain;Patient limited by fatigue Patient left: in chair;with call bell/phone within reach Nurse Communication: Mobility status  GP Functional Assessment Tool Used: clinical judgement Functional Limitation: Mobility: Walking and moving around Mobility: Walking and Moving Around Current Status ([id number]): At least 20 percent but less than 40 percent impaired, limited or restricted Mobility: Walking and Moving Around Goal Status [phone]): At least 20 percent but less than 40 percent impaired, limited or restricted Mobility: Walking and Moving Around Discharge Status [phone]): At least 20 percent but less than 40 percent impaired, limited or restricted   Tamala Ser 06/01/2012, 10:29 AM  [phone]

## 2012-06-01 NOTE — Discharge Summary (Signed)
Internal Medicine Teaching [facility] Discharge Note  Name: Lisa Haynes MRN: [id number] DOB: 04/07/1941 72 y.o.  Date of Admission: 05/31/2012  5:16 PM Date of Discharge: 06/01/2012 Attending Physician: Farley Ly, MD  Discharge Diagnosis: 1. Fall with back/leg pain. 2. Morbid obesity 3. Osteoporosis 4. Hypertension 5. Hyperlipidemia 6. Hypothyroidisim  Discharge Medications:   Medication List     As of 06/01/2012 11:20 AM    TAKE these medications         busPIRone 10 MG tablet   Commonly known as: BUSPAR   Take 5-10 mg by mouth 2 (two) times daily. Take 5 mg every morning and 10 mg at night.      CALCARB 600/D PO   Take by mouth 3 (three) times daily.      levothyroxine 25 MCG tablet   Commonly known as: SYNTHROID, LEVOTHROID   Take 1 tablet (25 mcg total) by mouth daily.      lisinopril 10 MG tablet   Commonly known as: PRINIVIL,ZESTRIL   Take 10 mg by mouth daily.      omeprazole 40 MG capsule   Commonly known as: PRILOSEC   Take 40 mg by mouth daily.      simvastatin 10 MG tablet   Commonly known as: ZOCOR   Take 1 tablet (10 mg total) by mouth at bedtime.      traMADol 50 MG tablet   Commonly known as: ULTRAM   TAKE 1 TABLET BY MOUTH TWICE DAILY AS NEEDED FOR PAIN      traMADol 50 MG tablet   Commonly known as: ULTRAM   Take 1 tablet (50 mg total) by mouth every 12 (twelve) hours as needed.         Disposition and follow-up:   Ms.Angelene PARLEE AMESCUA was discharged from [facility] in good condition.  At the hospital follow up visit please address  1) Back pain and debility Patient has chronic back pain and presented with back and leg pain s/p fall at home. Suspect fall 2/2 to physical debility as patient morbidly obese and not very mobile at home. She will receive home PT and be discharged with Rx for tramadol. Please assess control of pain and progress with PT.  Follow-up Appointments: Follow-up Information    Follow up  with Dow Adolph, MD. On 06/14/2012. (10:00AM)    Contact information:   [facility] [address] [place] [address] [phone]         Discharge Orders    Future Appointments: Provider: Department: Dept Phone: Center:   06/14/2012 10:15 AM Dow Adolph, MD [facility] [phone] [facility]     Future Orders Please Complete By Expires   Diet - low sodium heart healthy      Increase activity slowly      Call MD for:  severe uncontrolled pain         Consultations:  None  Procedures Performed:  Dg Chest 2 View  05/31/2012  *RADIOLOGY REPORT*  Clinical Data: Fall.  Chest pain.  CHEST - 2 VIEW  Comparison: Two-view chest 08/28/2010.  Findings: The heart is mildly enlarged.  There is no edema or effusion to suggest failure.  Exaggerated kyphosis is again noted in the thoracic spine.  Emphysematous changes are present.  IMPRESSION:  1.  No acute cardiopulmonary disease or significant interval change.   Original Report Authenticated By: Marin Roberts, M.D.    Dg Lumbar Spine Complete  05/31/2012  *  RADIOLOGY REPORT*  Clinical Data: Fall.  Low back pain.  LUMBAR SPINE - COMPLETE 4+ VIEW  Comparison: CT abdomen pelvis 05/28/2010.  Findings: Five non-rib bearing lumbar type vertebral bodies are present.  Degenerative facet changes are present at L5-S1 with stable slight anterolisthesis. Atherosclerotic calcifications are present in the aorta.  IMPRESSION:  1.  Degenerative changes of the lumbar spine, particularly at L5- S1. 2.  No acute abnormality.   Original Report Authenticated By: Marin Roberts, M.D.    Dg Hip Complete Left  05/31/2012  *RADIOLOGY REPORT*  Clinical Data: Fall.  Left hip pain.  LEFT HIP - COMPLETE 2+ VIEW  Comparison: None.  Findings: Mild osteopenia is present.  The left hip is located.  No acute bone or soft tissue abnormalities present.  Degenerative changes are present.  Atherosclerotic calcifications are noted in  the femoral artery. Pelvis is intact.  Degenerative changes are noted in the lower lumbar spine.  IMPRESSION:  1.  No acute abnormality. 2.  Degenerative changes of the left hip. 3.  Osteopenia.   Original Report Authenticated By: Marin Roberts, M.D.    Ct Head Wo Contrast  05/31/2012  *RADIOLOGY REPORT*  Clinical Data: Multiple falls.  CT HEAD WITHOUT CONTRAST  Technique:  Contiguous axial images were obtained from the base of the skull through the vertex without contrast.  Comparison: MRI 02/10/2009  Findings: Brain shows mild generalized atrophy.  No sign of focal old or acute infarction, mass lesion, hemorrhage, hydrocephalus or extra-axial collection.  Sinuses are clear.  Mastoids are largely undeveloped.  No fluid in the middle ears.  IMPRESSION: No acute or traumatic finding.  Age related atrophy.   Original Report Authenticated By: Paulina Fusi, M.D.    Dg Shoulder Left  05/31/2012  *RADIOLOGY REPORT*  Clinical Data: Fall with left shoulder pain.  LEFT SHOULDER - 2+ VIEW  Comparison: None  Findings: There is no evidence of fracture, subluxation or dislocation. Diffuse osteopenia is present. Mild degenerative changes at the glenohumeral joint noted. The visualized left bony thorax is unremarkable.  IMPRESSION: No evidence of acute bony abnormality.   Original Report Authenticated By: Harmon Pier, M.D.    Dg Knee Complete 4 Views Left  06/01/2012  *RADIOLOGY REPORT*  Clinical Data: Fall.  Knee pain.  LEFT KNEE - COMPLETE 4+ VIEW  Comparison: None.  Findings: There is mild lateral subluxation of the tibia relative to the femur.  This is chronic and degenerative, associated with tricompartmental osteoarthritis. Marginal osteophytes are present in all three compartments.  There is no joint effusion. No fracture is present.  Atherosclerosis.  IMPRESSION: Moderate to severe tricompartmental osteoarthritis.  No acute osseous abnormality.   Original Report Authenticated By: Andreas Newport, M.D.      Admission HPI:  72 year old woman with osteoarthritis and hypertension who went to the [facility] long emergency department this evening because of back pain, hip pain, and left leg pain. These ailments began last week when she fell off of her couch. She hit her left leg on the coffee table when she fell. She did not lose consciousness. Since that injury, the pain has been very mild and bearable; but 2 days ago, the pain began to worsen. It has worsened to the point that, when at its worst, she feels short of breath and nauseated. She even vomited once yesterday. This severe pain prompted her visit to the emergency department. She lives at home and sleeps on the couch. She is still able to ambulate, but with great difficulty. Ambulation  makes her pain much worse. She falls frequently. With one of her falls recently, she hit her head and has been having headaches since. She reports occasional orthostatic dizziness but no syncope. She is getting over a recent cold; she still has a post viral cough that is producing clear/white sputum. She has no dyspnea except when her back pain becomes severe. She has no chest pain or palpitations. She denies dysuria and constipation. She does have diarrhea about twice a week she says. No blood in her urine or stool.    Hospital Course by problem list:  1. Fall with back/leg pain. Likely a result of deconditioning. She does report orthostatic dizziness but never had syncope. No orthostatic vitals on admission.  Her falls have resulted in musculoskeletal injury causing her pain. No evidence of fracture on imaging. She does have evidence of degenerative disease on lumbar spine Xray. No evidence of radiculopathy or myelopathy on exam; as such, suspicion for herniated disk as cause of pain is low. She also complained of L leg pain just superior to the L knee joint secondary to the mechanical injury. No ecchymoses or swelling. Patient was weight bearing on both extremities. Her  pain improved with tramadol 50mg  BID. PT evaluated her, recommended home health PT and rolling walker. Patient has had home health PT in the past and is agreeable to resume treatment.   2. Morbid obesity  BMI 51 most recent check. Undoubtedly contributing suspect her habitus contributes to deconditioning and weakness, which has resulted in these frequent falls.   3. Osteoporosis Osteopenia on imaging. Bone density imaging from 2006 shows lumbar spine T score of -3.1 and left hip T score of -1.9, diagnostic for osteoporosis. She takes vitamin D and calcium daily. Therapies continued during admission  4. Hypertension At home, she takes lisinopril 10 mg daily. Low pressures over the past year: 166-124/78-63. Average blood pressure seems to be around 140/75. Continued home regimen of lisinopril 10 mg daily.   5. Hyperlipidemia  At home, she takes simvastatin 10 mg at bedtime. Last lipid panel was March 2013: cholesterol 162, triglycerides 97, HDL 56, and LDL 87. Continued simvastatin 10 mg at bedtime.   6. Hypothyroidisim  At home, she takes levothyroxine 25 mcg daily. TSH has remained stable over the past 2 years. Last check, it was 2.8 07/08/2011. No symptoms of hyper or hypothyroidism. Continued levothyroxine 25 mcg daily   7. Prophylaxis: SCDs    Discharge Vitals:  BP 145/70  Pulse 88  Temp 98.5 F (36.9 C) (Oral)  Resp 18  SpO2 98%  Discharge Labs:  Results for orders placed during the hospital encounter of 05/31/12 (from the past 24 hour(s))  URINALYSIS, ROUTINE W REFLEX MICROSCOPIC     Status: Abnormal   Collection Time   05/31/12  6:44 PM      Component Value Range   Color, Urine YELLOW  YELLOW   APPearance CLOUDY (*) CLEAR   Specific Gravity, Urine 1.006  1.005 - 1.030   pH 6.5  5.0 - 8.0   Glucose, UA NEGATIVE  NEGATIVE mg/dL   Hgb urine dipstick NEGATIVE  NEGATIVE   Bilirubin Urine NEGATIVE  NEGATIVE   Ketones, ur NEGATIVE  NEGATIVE mg/dL   Protein, ur NEGATIVE   NEGATIVE mg/dL   Urobilinogen, UA 0.2  0.0 - 1.0 mg/dL   Nitrite NEGATIVE  NEGATIVE   Leukocytes, UA TRACE (*) NEGATIVE  URINE MICROSCOPIC-ADD ON     Status: Abnormal   Collection Time   05/31/12  6:44 PM      Component Value Range   Squamous Epithelial / LPF FEW (*) RARE   WBC, UA 0-2  <3 WBC/hpf   Bacteria, UA FEW (*) RARE  BASIC METABOLIC PANEL     Status: Abnormal   Collection Time   05/31/12  7:00 PM      Component Value Range   Sodium 138  135 - 145 mEq/L   Potassium 4.5  3.5 - 5.1 mEq/L   Chloride 103  96 - 112 mEq/L   CO2 25  19 - 32 mEq/L   Glucose, Bld 95  70 - 99 mg/dL   BUN 18  6 - 23 mg/dL   Creatinine, Ser 1.47  0.50 - 1.10 mg/dL   Calcium 9.1  8.4 - 82.9 mg/dL   GFR calc non Af Amer 68 (*) >90 mL/min   GFR calc Af Amer 79 (*) >90 mL/min  CBC WITH DIFFERENTIAL     Status: Abnormal   Collection Time   05/31/12  7:00 PM      Component Value Range   WBC 8.3  4.0 - 10.5 K/uL   RBC 4.48  3.87 - 5.11 MIL/uL   Hemoglobin 12.8  12.0 - 15.0 g/dL   HCT 56.2  13.0 - 86.5 %   MCV 90.8  78.0 - 100.0 fL   MCH 28.6  26.0 - 34.0 pg   MCHC 31.4  30.0 - 36.0 g/dL   RDW 78.4  69.6 - 29.5 %   Platelets 208  150 - 400 K/uL   Neutrophils Relative 58  43 - 77 %   Neutro Abs 4.8  1.7 - 7.7 K/uL   Lymphocytes Relative 26  12 - 46 %   Lymphs Abs 2.1  0.7 - 4.0 K/uL   Monocytes Relative 14 (*) 3 - 12 %   Monocytes Absolute 1.1 (*) 0.1 - 1.0 K/uL   Eosinophils Relative 2  0 - 5 %   Eosinophils Absolute 0.2  0.0 - 0.7 K/uL   Basophils Relative 1  0 - 1 %   Basophils Absolute 0.0  0.0 - 0.1 K/uL  LACTIC ACID, PLASMA     Status: Normal   Collection Time   05/31/12  7:00 PM      Component Value Range   Lactic Acid, Venous 0.9  0.5 - 2.2 mmol/L  TROPONIN I     Status: Normal   Collection Time   05/31/12  7:00 PM      Component Value Range   Troponin I <0.30  <0.30 ng/mL  HEPATIC FUNCTION PANEL     Status: Abnormal   Collection Time   06/01/12  6:35 AM      Component Value Range    Total Protein 6.6  6.0 - 8.3 g/dL   Albumin 3.1 (*) 3.5 - 5.2 g/dL   AST 17  0 - 37 U/L   ALT 9  0 - 35 U/L   Alkaline Phosphatase 63  39 - 117 U/L   Total Bilirubin 0.5  0.3 - 1.2 mg/dL   Bilirubin, Direct 0.1  0.0 - 0.3 mg/dL   Indirect Bilirubin 0.4  0.3 - 0.9 mg/dL  CBC     Status: Abnormal   Collection Time   06/01/12  6:35 AM      Component Value Range   WBC 7.4  4.0 - 10.5 K/uL   RBC 4.10  3.87 - 5.11 MIL/uL   Hemoglobin 11.8 (*) 12.0 - 15.0 g/dL  HCT 37.4  36.0 - 46.0 %   MCV 91.2  78.0 - 100.0 fL   MCH 28.8  26.0 - 34.0 pg   MCHC 31.6  30.0 - 36.0 g/dL   RDW 40.9  81.1 - 91.4 %   Platelets 191  150 - 400 K/uL  BASIC METABOLIC PANEL     Status: Abnormal   Collection Time   06/01/12  6:35 AM      Component Value Range   Sodium 139  135 - 145 mEq/L   Potassium 4.0  3.5 - 5.1 mEq/L   Chloride 106  96 - 112 mEq/L   CO2 24  19 - 32 mEq/L   Glucose, Bld 91  70 - 99 mg/dL   BUN 15  6 - 23 mg/dL   Creatinine, Ser 7.82  0.50 - 1.10 mg/dL   Calcium 8.8  8.4 - 95.6 mg/dL   GFR calc non Af Amer 68 (*) >90 mL/min   GFR calc Af Amer 79 (*) >90 mL/min    Signed: Bronson Curb 06/01/2012, 11:20 AM   Time Spent on Discharge: 35 min Services Ordered on Discharge: Home PT Equipment Ordered on Discharge: [employer], 3-in-1 commode

## 2012-06-02 ENCOUNTER — Other Ambulatory Visit: Payer: Self-pay | Admitting: Internal Medicine

## 2012-06-02 DIAGNOSIS — N390 Urinary tract infection, site not specified: Secondary | ICD-10-CM | POA: Insufficient documentation

## 2012-06-02 LAB — URINE CULTURE

## 2012-06-02 MED ORDER — CIPROFLOXACIN HCL 500 MG PO TABS: 500.0000 mg | ORAL_TABLET | Freq: Two times a day (BID) | ORAL | Status: DC

## 2012-06-02 NOTE — Progress Notes (Signed)
Urine culture from hospital admission returned w >100,000 CFU of Klebsiella pneumonia pansensitive. Has taken ciprofloxacin in past and tolerated well. Sent in Rx to her pharmacy and notified patient, who will pick up Rx.

## 2012-06-06 DIAGNOSIS — M545 Low back pain, unspecified: Secondary | ICD-10-CM | POA: Diagnosis not present

## 2012-06-06 DIAGNOSIS — M25569 Pain in unspecified knee: Secondary | ICD-10-CM | POA: Diagnosis not present

## 2012-06-06 DIAGNOSIS — IMO0001 Reserved for inherently not codable concepts without codable children: Secondary | ICD-10-CM | POA: Diagnosis not present

## 2012-06-06 DIAGNOSIS — M810 Age-related osteoporosis without current pathological fracture: Secondary | ICD-10-CM | POA: Diagnosis not present

## 2012-06-06 DIAGNOSIS — M129 Arthropathy, unspecified: Secondary | ICD-10-CM | POA: Diagnosis not present

## 2012-06-06 DIAGNOSIS — I10 Essential (primary) hypertension: Secondary | ICD-10-CM | POA: Diagnosis not present

## 2012-06-06 NOTE — Progress Notes (Signed)
Pt called today stating pharmacy did not have this Rx.  She is not feeling any better. I called in Rx for cipro to [facility].  Pt to get today.

## 2012-06-14 ENCOUNTER — Ambulatory Visit: Payer: Medicare Other | Admitting: Internal Medicine

## 2012-06-29 ENCOUNTER — Ambulatory Visit (INDEPENDENT_AMBULATORY_CARE_PROVIDER_SITE_OTHER): Payer: Medicare Other | Admitting: Internal Medicine

## 2012-06-29 ENCOUNTER — Encounter: Payer: Self-pay | Admitting: Internal Medicine

## 2012-06-29 VITALS — BP 146/72 | HR 88 | Temp 97.5°F | Ht 59.0 in | Wt 242.5 lb

## 2012-06-29 DIAGNOSIS — M545 Low back pain, unspecified: Secondary | ICD-10-CM

## 2012-06-29 DIAGNOSIS — K219 Gastro-esophageal reflux disease without esophagitis: Secondary | ICD-10-CM | POA: Diagnosis not present

## 2012-06-29 DIAGNOSIS — I10 Essential (primary) hypertension: Secondary | ICD-10-CM | POA: Diagnosis not present

## 2012-06-29 DIAGNOSIS — Z0000 Encounter for general adult medical examination without abnormal findings: Secondary | ICD-10-CM | POA: Diagnosis not present

## 2012-06-29 DIAGNOSIS — M1990 Unspecified osteoarthritis, unspecified site: Secondary | ICD-10-CM

## 2012-06-29 DIAGNOSIS — M810 Age-related osteoporosis without current pathological fracture: Secondary | ICD-10-CM

## 2012-06-29 DIAGNOSIS — R5381 Other malaise: Secondary | ICD-10-CM | POA: Diagnosis not present

## 2012-06-29 DIAGNOSIS — F064 Anxiety disorder due to known physiological condition: Secondary | ICD-10-CM

## 2012-06-29 MED ORDER — CALCIUM CARBONATE-VITAMIN D 600-400 MG-UNIT PO TABS: 1.0000 | ORAL_TABLET | Freq: Three times a day (TID) | ORAL | Status: DC

## 2012-06-29 MED ORDER — BUSPIRONE HCL 10 MG PO TABS: 5.0000 mg | ORAL_TABLET | Freq: Two times a day (BID) | ORAL | Status: DC

## 2012-06-29 MED ORDER — ZOSTER VACCINE LIVE 19400 UNT/0.65ML ~~LOC~~ SOLR: 0.6500 mL | Freq: Once | SUBCUTANEOUS | Status: DC

## 2012-06-29 MED ORDER — ACETAMINOPHEN 325 MG PO TABS: 650.0000 mg | ORAL_TABLET | Freq: Four times a day (QID) | ORAL | Status: DC | PRN

## 2012-06-29 MED ORDER — RISEDRONATE SODIUM 5 MG PO TABS: 5.0000 mg | ORAL_TABLET | Freq: Every day | ORAL | Status: DC

## 2012-06-29 MED ORDER — OMEPRAZOLE 40 MG PO CPDR: 40.0000 mg | DELAYED_RELEASE_CAPSULE | Freq: Every day | ORAL | Status: DC

## 2012-06-29 MED ORDER — RISEDRONATE SODIUM 150 MG PO TABS: 150.0000 mg | ORAL_TABLET | ORAL | Status: DC

## 2012-06-29 NOTE — Assessment & Plan Note (Signed)
Have counseled her on fall precautions. Have restarted her calcium and vitamin D supplementation. she will also be initiated on a bisphosphonate.  Plan. - Risepridone 150 mg once every month. - Counseled her about fall precautions - Also provided information about side effects if this medication specifically the need to take it on an empty stomach and waiting 30 min in an upright position. She has acid reflux symptoms. Pharmacy advised on months therapy as opposed to more frequent regimens. She will need to be assessed for side effects. Also risedronate is more tolerable than other bisphosphates in terms of GI side effects. - follow up with Dr Virgina Organ in one month.

## 2012-06-29 NOTE — Assessment & Plan Note (Signed)
She reports uncontrolled pain with tramadol 50 mg every 12 hours as needed for pain.   Plan. -I have added Tylenol 650 mg every 6 hours as needed for pain.

## 2012-06-29 NOTE — Patient Instructions (Addendum)
Please start taking Risendronate I will refer you for mental health check Please stop taking Citalopram  Please take Tylenol for pain as need  Please come back in one month     Treatment Goals:  Goals (1 Years of Data) as of 06/29/12   None      Progress Toward Treatment Goals:  Treatment Goal 06/29/2012  Blood pressure at goal    Self Care Goals & Plans:  Self Care Goal 06/29/2012  Manage my medications take my medicines as prescribed  Monitor my health keep track of my blood glucose       Care Management & Community Referrals:

## 2012-06-29 NOTE — Assessment & Plan Note (Signed)
She continues with recommended dose of Buspar and discontinued the Celexa. She however reports symptoms of crying spells and feeling sad from time to time. I have discussed this with Dr Aundria Rud who recommended a mental health referral for optimal treatment of her symptoms. In the meantime, she will continue with her Buspar.

## 2012-06-29 NOTE — Assessment & Plan Note (Signed)
BP Readings from Last 3 Encounters:  06/29/12 146/72  06/01/12 145/70  01/26/12 128/72    Lab Results  Component Value Date   NA 139 06/01/2012   K 4.0 06/01/2012   CREATININE 0.83 06/01/2012    Assessment:  Blood pressure control: controlled  Progress toward BP goal:  at goal  Comments:  Plan:  Medications:  continue current medications  Educational resources provided:    Self management tools provided:    Other plans: No side effects noted.

## 2012-06-29 NOTE — Assessment & Plan Note (Signed)
She currently reports the ability to perform her ADLs. She has good social support from her son, who lives with her. She has no stairs in her house and she has a walker to help her get around. Her son helps her with doing groceries and paying her bills. He also helps and preparing meals. She is current receiving home health physical therapy and occupational therapy. She seems to have good cognitive function. This will require continued evaluation on a regular basis.

## 2012-06-29 NOTE — Progress Notes (Signed)
Patient ID: Lisa Haynes, female   DOB: Jul 30, 1940, 72 y.o.   MRN: [id number]  Subjective:   Patient ID: Lisa Haynes female   DOB: 1940/09/04 72 y.o.   MRN: [id number]  HPI: [name] is a 72 y.o. with past medical history below presents for a hospital follow up visit.   She is brought in by her daughter-in-law.  She reports no specific complaints today except refill for her anti-acid medications and something for her chronic back pain. She sleep on a cough and her pain has been bothering her sometimes waking her up at night and making her sleep uncomfortable.   She continues to have [facility] PT/OT. She lived with her son who helps with preparing meals and doing the groceries and paying her home bills. She can do ADLs. She has a rolling walker at home to help her get around the house. She reports symptoms of depression, even though she is compliant with her medications, including Buspirone. She uses a pill box for her medications and she is confident even though she has one time confused her Aleve.   Please see the A&P for the status of the pt's chronic medical problems.   Past Medical History  Diagnosis Date  . Bilateral knee pain   . Depression   . Hypertension   . Low back pain   . Osteoarthritis   . Osteoporosis   . Hypothyroidism   . Pneumonia     hx of PNA 2013  . Incontinence    Current Outpatient Prescriptions  Medication Sig Dispense Refill  . busPIRone (BUSPAR) 10 MG tablet Take 0.5-1 tablets (5-10 mg total) by mouth 2 (two) times daily. Take 5 mg every morning and 10 mg at night.  30 tablet  0  . levothyroxine (SYNTHROID, LEVOTHROID) 25 MCG tablet Take 1 tablet (25 mcg total) by mouth daily.  30 tablet  6  . lisinopril (PRINIVIL,ZESTRIL) 10 MG tablet Take 10 mg by mouth daily.      . simvastatin (ZOCOR) 10 MG tablet Take 1 tablet (10 mg total) by mouth at bedtime.  90 tablet  4  . acetaminophen (TYLENOL) 325 MG tablet Take 2 tablets (650 mg total) by mouth every 6  (six) hours as needed for pain.  60 tablet  3  . Calcium Carbonate-Vitamin D (CALCARB 600/D) 600-400 MG-UNIT per tablet Take 1 tablet by mouth 3 (three) times daily with meals.  30 tablet  2  . omeprazole (PRILOSEC) 40 MG capsule Take 1 capsule (40 mg total) by mouth daily.  40 capsule  11  . risedronate (ACTONEL) 5 MG tablet Take 1 tablet (5 mg total) by mouth daily before breakfast. with water on empty stomach, nothing by mouth or lie down for next 30 minutes.  30 tablet  0  . traMADol (ULTRAM) 50 MG tablet Take 1 tablet (50 mg total) by mouth every 12 (twelve) hours as needed.  30 tablet  0  . zoster vaccine live, PF, (ZOSTAVAX) 78295 UNT/0.65ML injection Inject 19,400 Units into the skin once.  1 each  0  . [DISCONTINUED] omeprazole (PRILOSEC) 40 MG capsule Take 1 capsule (40 mg total) by mouth daily.  30 capsule  11   No current facility-administered medications for this visit.   No family history on file. History   Social History  . Marital Status: Legally Separated    Spouse Name: N/A    Number of Children: N/A  . Years of Education: N/A   Occupational History  .  Retired    Social History Main Topics  . Smoking status: Never Smoker   . Smokeless tobacco: Never Used  . Alcohol Use: No  . Drug Use: No  . Sexually Active: None   Other Topics Concern  . None   Social History Narrative   Retired: Motel work   Divorced    Regular exercise- no   Lives alone   Review of Systems: Constitutional: Denies fever, chills, diaphoresis, appetite change and fatigue.  HEENT: Denies photophobia, eye pain, redness, hearing loss, ear pain, congestion, sore throat, rhinorrhea, sneezing, mouth sores, trouble swallowing, neck pain, neck stiffness and tinnitus.   Respiratory: Denies SOB, DOE, cough, chest tightness,  and wheezing.   Cardiovascular: Denies chest pain, palpitations and leg swelling.  Gastrointestinal: Denies nausea, vomiting, abdominal pain, diarrhea, constipation, blood in  stool and abdominal distention.  Genitourinary: Denies dysuria, urgency, frequency, hematuria, flank pain and difficulty urinating.  Musculoskeletal: Denies myalgias, back pain, joint swelling, arthralgias and gait problem.  Skin: Denies pallor, rash and wound.  Neurological: Denies dizziness, seizures, syncope, weakness, light-headedness, numbness and headaches.  Hematological: Denies adenopathy. Easy bruising, personal or family bleeding history  Psychiatric/Behavioral: Denies suicidal ideation, mood changes, confusion, nervousness, sleep disturbance and agitation. She reports chronic depression and crying spells sometimes.   Objective:  Physical Exam: Filed Vitals:   06/29/12 1347  BP: 146/72  Pulse: 88  Temp: 97.5 F (36.4 C)  TempSrc: Oral  Height: 4\' 11"  (1.499 m)  Weight: 242 lb 8 oz (109.997 kg)  SpO2: 96%   Constitutional: Vital signs reviewed.  Patient is a well-developed and well-nourished in no acute distress and cooperative with exam. Alert and oriented x3.  Head: Normocephalic and atraumatic Ear: TM normal bilaterally Mouth: no erythema or exudates, MMM Eyes: PERRL, EOMI, conjunctivae normal, No scleral icterus.  Neck: Supple, Trachea midline normal ROM, No JVD, mass, thyromegaly, or carotid bruit present.  Cardiovascular: RRR, S1 normal, S2 normal, no MRG, pulses symmetric and intact bilaterally Pulmonary/Chest: CTAB, no wheezes, rales, or rhonchi Abdominal: Soft. Non-tender, non-distended, bowel sounds are normal, no masses, organomegaly, or guarding present.  GU: no CVA tenderness Musculoskeletal: No joint deformities, erythema, or stiffness, ROM full and no nontender Hematology: no cervical, inginal, or axillary adenopathy.  Neurological: A&O x3, Strength is normal and symmetric bilaterally, cranial nerve II-XII are grossly intact, no focal motor deficit, sensory intact to light touch bilaterally.  Skin: Warm, dry and intact. No rash, cyanosis, or clubbing.   Psychiatric: Normal mood and affect. speech and behavior is normal. Judgment and thought content normal. Cognition and memory are normal.   Assessment & Plan:  I have discussed my assessment and plan for the care of Ms. Krisher with Dr. Aundria Rud as detailed under problem based charting.  In brief, I have added Tylenol on her pain medication and encourage her to continue with her tramadol. I have also written a referral to psychiatry for evaluation and treatment of depression. In the, meantime, she will continue with the Buspirone and stop the Citalopram. Have sent a prescription of monthly Risendronate for the treatment of osteoporosis. She will will see Dr Virgina Organ in one month.

## 2012-07-05 NOTE — Addendum Note (Signed)
Addended by: Neomia Dear on: 07/05/2012 05:57 PM   Modules accepted: Orders

## 2012-07-06 ENCOUNTER — Telehealth: Payer: Self-pay | Admitting: Licensed Clinical Social Worker

## 2012-07-06 NOTE — Telephone Encounter (Signed)
"  I've been big sick since I left from up there."  CSW placed call to Ms. Diesing to discuss scheduling pt an appt with [facility].  Pt states she has not thought about it since her appt.  Pt states she no longer desires to see a psychiatrist.  CSW informed Ms. Rickels current appt with psychiatry are scheduling several weeks out, and she may physically feel better by the appt.  Pt hesitant and states she has an appt with [facility] this month.  CSW informed Ms. Winbush, CSW can schedule appt at [facility] when pt is ready.  Encouraged pt to notify her PCP during appt or to call CSW .  Pt has CSW contact information and aware CSW is available to assist as needed.  CSW will sign off.

## 2012-07-27 ENCOUNTER — Other Ambulatory Visit: Payer: Self-pay | Admitting: Internal Medicine

## 2012-07-27 NOTE — Telephone Encounter (Signed)
[facility] called - stated they have the last rx on file, pt does not need refill at this time.

## 2012-07-27 NOTE — Telephone Encounter (Signed)
6 tablets were prescribed in February.  This is to be taken once a month.  Why is a refill needed now?

## 2012-07-31 ENCOUNTER — Encounter: Payer: Self-pay | Admitting: Internal Medicine

## 2012-07-31 ENCOUNTER — Ambulatory Visit (INDEPENDENT_AMBULATORY_CARE_PROVIDER_SITE_OTHER): Payer: Medicare Other | Admitting: Internal Medicine

## 2012-07-31 VITALS — BP 133/68 | HR 88 | Temp 98.3°F | Wt 233.9 lb

## 2012-07-31 DIAGNOSIS — I10 Essential (primary) hypertension: Secondary | ICD-10-CM

## 2012-07-31 DIAGNOSIS — F329 Major depressive disorder, single episode, unspecified: Secondary | ICD-10-CM

## 2012-07-31 DIAGNOSIS — R112 Nausea with vomiting, unspecified: Secondary | ICD-10-CM | POA: Insufficient documentation

## 2012-07-31 DIAGNOSIS — E669 Obesity, unspecified: Secondary | ICD-10-CM | POA: Diagnosis not present

## 2012-07-31 DIAGNOSIS — R609 Edema, unspecified: Secondary | ICD-10-CM | POA: Diagnosis not present

## 2012-07-31 DIAGNOSIS — E785 Hyperlipidemia, unspecified: Secondary | ICD-10-CM

## 2012-07-31 DIAGNOSIS — M810 Age-related osteoporosis without current pathological fracture: Secondary | ICD-10-CM

## 2012-07-31 DIAGNOSIS — R600 Localized edema: Secondary | ICD-10-CM

## 2012-07-31 DIAGNOSIS — Z0000 Encounter for general adult medical examination without abnormal findings: Secondary | ICD-10-CM | POA: Diagnosis not present

## 2012-07-31 DIAGNOSIS — F064 Anxiety disorder due to known physiological condition: Secondary | ICD-10-CM | POA: Diagnosis not present

## 2012-07-31 DIAGNOSIS — R1013 Epigastric pain: Secondary | ICD-10-CM | POA: Diagnosis not present

## 2012-07-31 LAB — COMPLETE METABOLIC PANEL WITH GFR
Albumin: 3.5 g/dL (ref 3.5–5.2)
Alkaline Phosphatase: 217 U/L — ABNORMAL HIGH (ref 39–117)
BUN: 20 mg/dL (ref 6–23)
Calcium: 8.9 mg/dL (ref 8.4–10.5)
GFR, Est Non African American: 76 mL/min
Glucose, Bld: 95 mg/dL (ref 70–99)
Potassium: 4.5 mEq/L (ref 3.5–5.3)
Total Bilirubin: 1.8 mg/dL — ABNORMAL HIGH (ref 0.3–1.2)

## 2012-07-31 LAB — LIPASE: Lipase: 42 U/L (ref 0–75)

## 2012-07-31 MED ORDER — SIMVASTATIN 10 MG PO TABS: 10.0000 mg | ORAL_TABLET | Freq: Every day | ORAL | Status: DC

## 2012-07-31 NOTE — Assessment & Plan Note (Signed)
x2 weeks. Possibly secondary to bisphosphonate that was started on last opc visit 06/29/12 one time monthly dosing.  Intermittent peri-umbilical and epigastric abdominal pain, non-radiating, tenderness to very deep palpation.  x2 episodes of vomiting in 2 weeks, last one yesterday morning.  Nausea worse with food improved with milk.    -hold risedronate and monitor for improvement of symptoms -f/u cmet and lipase -if worsening, asked to call or return to clinic

## 2012-07-31 NOTE — Assessment & Plan Note (Signed)
Likely cannot afford zostavax with medicare
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
## 2012-07-31 NOTE — Assessment & Plan Note (Addendum)
Started on Actonel by Dr. Zada Girt at the end of February and reports 2 weeks of nausea and abdominal pain today.  Possibly secondary to medication side effect?  -will d/c Risedronate -monitor for improvement of symptoms -continue vitamin d and calcium -reports frequent falls, follow up PT records

## 2012-07-31 NOTE — Patient Instructions (Addendum)
We will stop your Risedronate today and monitor you for a couple of weeks to see if you abdominal pain and nausea improves  If it gets worse, call the clinic [phone] or if severe go to ED  We will get some lab work today and I will call you with results if anything is abnormal  Continue to take your calcium and vitamin d and your other medications as prescribed  Please walk with supervision and support at all times  Continue physical therapy  Please re-consider going to behavioral health, we will have our social worker contact you for transportation assistance  Please wear your stockings on your legs to help with swelling and keep them elevated as much as possible, if you notice worsening swelling or pain in your legs call clinic [phone] immediately

## 2012-07-31 NOTE — Progress Notes (Signed)
07/31/12 - Teds hose given to pt

## 2012-07-31 NOTE — Assessment & Plan Note (Signed)
BP Readings from Last 3 Encounters:  07/31/12 133/68  06/29/12 146/72  06/01/12 145/70   Lab Results  Component Value Date   NA 139 06/01/2012   K 4.0 06/01/2012   CREATININE 0.83 06/01/2012   Assessment:  Blood pressure control: controlled  Progress toward BP goal:  at goal  Plan:  Medications:  continue current medications: lisinopril 10mg  qd  Educational resources provided: brochure  Self management tools provided:

## 2012-07-31 NOTE — Assessment & Plan Note (Addendum)
Chronic.  Left more tender and limited mobility than right.  +1 pitting edema b/l with darkened skin discoloration near feet.    -instructed to elevate legs when possible -TED stockings -if notices worsening swelling, pain, redness or signs of infection, call clinic immediately or go to ED, may need doppler at that time.  Denies personal or family hx of clots.   -continue PT

## 2012-07-31 NOTE — Assessment & Plan Note (Signed)
Has lost approximately 10 pounds since last clinic visit last month.  Has been trying portion control but needs to exercise.    -counseled on diet and exercise

## 2012-08-02 ENCOUNTER — Telehealth: Payer: Self-pay | Admitting: *Deleted

## 2012-08-02 ENCOUNTER — Encounter: Payer: Self-pay | Admitting: Licensed Clinical Social Worker

## 2012-08-02 NOTE — Telephone Encounter (Signed)
Pt states she feels better; able to "keep food on my stomach" and no n/v. Informed pt if pain returns, to call the clinic for an appt. She agreed.

## 2012-08-02 NOTE — Telephone Encounter (Signed)
Talked to Amber,RN at [facility] - she will fax PT records per Dr Waynard Reeds request.

## 2012-08-02 NOTE — Telephone Encounter (Signed)
Thank you.    i also called her today and left a message in regards to her lab tests, her LFTs were elevated but no answer.  She will likely need to return to clinic for close follow up and possible ultrasound especially if no improvement in abdominal pain.   Can we try calling her back today and if pain still present have her return to the clinic to be seen by opc resident please.    Thank you

## 2012-08-02 NOTE — Progress Notes (Signed)
Lisa Haynes was referred to CSW for assistance with transportation, [facility] referral and behavioral health appt.  CSW placed called to pt.  CSW left message requesting return call. CSW provided contact hours and phone number.  CSW requested [facility] to review pt's chart.  CSW will follow up with Lisa Haynes to confirm pt in agreement with referral to [facility] for [employer] transportation, referral to [facility] and [employer].

## 2012-08-02 NOTE — Telephone Encounter (Signed)
I called and spoke to Lisa Haynes this morning and she claims her abdominal pain has improved and nausea and vomiting has resolved.  She says she had some mild discomfort when eating but that has also improved.  I discussed her elevated ALP on her CMET from clinic that day and let her know to come back to clinic for follow up 1-2 weeks.  However, if her pain returns, she is to call clinic and return right away for further work up, likely need for ultrasound and repeat LFTs for concern of possible cholecystitis.    She understands and is in agreement.  I will also send a message to the front desk to schedule her for a follow up in 1-2 weeks as well.

## 2012-08-02 NOTE — Telephone Encounter (Signed)
Message copied by Hassan Buckler on Wed Aug 02, 2012  9:13 AM ------      Message from: Baltazar Apo      Created: Mon Jul 31, 2012  5:57 PM      Regarding: pt records please       Dear Rivka Barbara,            Can we please get records of Ms. Banker's home health PT.  She is risk for falls.   ------

## 2012-08-03 ENCOUNTER — Other Ambulatory Visit: Payer: Self-pay | Admitting: Internal Medicine

## 2012-08-03 DIAGNOSIS — R5381 Other malaise: Secondary | ICD-10-CM

## 2012-08-04 NOTE — Progress Notes (Signed)
08/04/12: Place call to pt to follow up on Transportation/Behavioral Health.  CSW received feedback from [facility] recommending referral to [facility] PT/RN to address frequent falls in home.  CSW placed called to pt.  CSW left message requesting return call. CSW provided contact hours and phone number.

## 2012-08-05 ENCOUNTER — Encounter: Payer: Self-pay | Admitting: Internal Medicine

## 2012-08-05 DIAGNOSIS — R269 Unspecified abnormalities of gait and mobility: Secondary | ICD-10-CM | POA: Insufficient documentation

## 2012-08-08 ENCOUNTER — Telehealth: Payer: Self-pay | Admitting: *Deleted

## 2012-08-08 NOTE — Telephone Encounter (Signed)
Pt calls and leaves a message for shana, SW, she does not want a wheelchair, states her son is going to take her on walks, may call her at [phone]

## 2012-08-09 NOTE — Telephone Encounter (Signed)
Thank you for note 

## 2012-08-09 NOTE — Progress Notes (Signed)
08/08/12: CSW placed call to Ms. Heroux to follow up on referral for Transportation, [employer], and home health services.  Pt states she does not need medical transportation because they would not be able to help her in/out of the van.  Ms. Bovey states currently, niece/d-i-l take her to appt's and son helps her out of the car into the house. Son is currently unemployed.  CSW discussed option of initiating PCS, pt declined because son is actively involved. Pt declines behavioral health referral.  Ms. Perkovich states she has had two deaths in her family last year.  Pt was very close to her brother that past away in Nov 2013.  This brother handled Ms. Rozman's finances and day to day activity.  Currently, pt's son is living with her and her d-i-l handles her finances.  Pt became tearful when talking about her brothers' passing.  CSW refocused Ms. Antillon to current living family members.  Pt recently had visit from brother that lives in [place].  Pt states she often uses her d-i-l and in-law's mother to talk to when she's feeling down.  Pt declines referral to mental health provider therapist or psychiatrist.  Ms. Blanchette states she shared a lot with her PCP which was helpful.  Pt aware services are available.  Pt recently was d/c from [facility] PT.  CSW inquired if [facility] PT made recommendations for pt regarding mobility.  Pt states no recommendations were made.  CSW inquired if pt requires a lot of assistance when being transported, pt states her son helps her.  Pt denies need for w/c and states she uses a walker.  Pt voiced excitement for getting a new bed, pt states she has been sleeping on the sofa.  CSW inquired if pt was getting a hospital bed, pt states family was able to get her a bed that was donated to them. Currently, Ms. Mcgeachy declines all services at this time.  Pt states she is aware of the available services and will notify CSW when she is in need of them.

## 2012-08-11 ENCOUNTER — Encounter: Payer: Self-pay | Admitting: Licensed Clinical Social Worker

## 2012-08-11 NOTE — Addendum Note (Signed)
Addended by: Neomia Dear on: 08/11/2012 05:14 PM   Modules accepted: Orders

## 2012-08-25 ENCOUNTER — Other Ambulatory Visit: Payer: Self-pay | Admitting: Internal Medicine

## 2012-08-29 ENCOUNTER — Encounter: Payer: Medicare Other | Admitting: Internal Medicine

## 2012-09-05 ENCOUNTER — Ambulatory Visit (INDEPENDENT_AMBULATORY_CARE_PROVIDER_SITE_OTHER): Payer: Medicare Other | Admitting: Internal Medicine

## 2012-09-05 ENCOUNTER — Other Ambulatory Visit: Payer: Self-pay | Admitting: Internal Medicine

## 2012-09-05 ENCOUNTER — Encounter: Payer: Self-pay | Admitting: Internal Medicine

## 2012-09-05 VITALS — BP 137/77 | HR 89 | Temp 98.3°F | Wt 235.3 lb

## 2012-09-05 DIAGNOSIS — Z6841 Body Mass Index (BMI) 40.0 and over, adult: Secondary | ICD-10-CM

## 2012-09-05 DIAGNOSIS — M545 Low back pain, unspecified: Secondary | ICD-10-CM | POA: Diagnosis not present

## 2012-09-05 DIAGNOSIS — R112 Nausea with vomiting, unspecified: Secondary | ICD-10-CM | POA: Diagnosis not present

## 2012-09-05 DIAGNOSIS — R5381 Other malaise: Secondary | ICD-10-CM

## 2012-09-05 DIAGNOSIS — I10 Essential (primary) hypertension: Secondary | ICD-10-CM | POA: Diagnosis not present

## 2012-09-05 DIAGNOSIS — Z0000 Encounter for general adult medical examination without abnormal findings: Secondary | ICD-10-CM

## 2012-09-05 DIAGNOSIS — R609 Edema, unspecified: Secondary | ICD-10-CM | POA: Diagnosis not present

## 2012-09-05 DIAGNOSIS — R7402 Elevation of levels of lactic acid dehydrogenase (LDH): Secondary | ICD-10-CM

## 2012-09-05 DIAGNOSIS — R600 Localized edema: Secondary | ICD-10-CM

## 2012-09-05 DIAGNOSIS — M810 Age-related osteoporosis without current pathological fracture: Secondary | ICD-10-CM

## 2012-09-05 DIAGNOSIS — R7401 Elevation of levels of liver transaminase levels: Secondary | ICD-10-CM

## 2012-09-05 LAB — COMPREHENSIVE METABOLIC PANEL
ALT: 18 U/L (ref 0–35)
AST: 26 U/L (ref 0–37)
Calcium: 9.4 mg/dL (ref 8.4–10.5)
Chloride: 103 mEq/L (ref 96–112)
Creat: 0.87 mg/dL (ref 0.50–1.10)

## 2012-09-05 MED ORDER — TRAMADOL HCL 50 MG PO TABS: 50.0000 mg | ORAL_TABLET | Freq: Two times a day (BID) | ORAL | Status: DC | PRN

## 2012-09-05 MED ORDER — FUROSEMIDE 20 MG PO TABS: 20.0000 mg | ORAL_TABLET | Freq: Every day | ORAL | Status: DC

## 2012-09-05 NOTE — Assessment & Plan Note (Addendum)
Much improved and resolved at this time.  She explains symptoms returned mid last month (april) when she took her risedronate dose even though she was supposed to have stopped the medication but she said she forgot.  Symptoms spontaneously resolved after a few days.    Noted to have elevated ALP 217 and AST 41 on CMET in March 2014.   -repeat CMET today -monitor for return of symptoms, if complaints arise again despite being off Actonel, will need abdominal ultrasound and further work up for symptoms and transaminitis.

## 2012-09-05 NOTE — Assessment & Plan Note (Signed)
BP Readings from Last 3 Encounters:  09/05/12 137/77  07/31/12 133/68  06/29/12 146/72   Lab Results  Component Value Date   NA 137 07/31/2012   K 4.5 07/31/2012   CREATININE 0.78 07/31/2012    Assessment: Blood pressure control: controlled Progress toward BP goal:  at goal  Plan: Medications:  continue current medications on Lisinopril 10mg  qd and started lasix 20mg  qd today to help with edema Educational resources provided:   Self management tools provided:   Other plans:told to monitor bp at home and watch for hypotension

## 2012-09-05 NOTE — Patient Instructions (Addendum)
General Instructions: Please STOP TAKING THE RISEDRONATE-- we have stopped it since the last visit do not take any more refills and monitor your abdominal pain  If your abdominal pain returns, call or come to clinic [phone]  We will start you on Lasix 20mg  once daily to help with your leg edema.  You will need to come back in ONE month to check your labs.    Continue to elevate your legs while at home in seated position and when sleeping as much as possible  Decrease your salt intake food and work on portion control to also help with weight loss.  Less chest, additional salt, and processed meats  Consider the silver sneakers program to get more physically active  If you notice worsening of your leg swelling or increased pain in one leg compared to the other, call the clinic immediately [phone]  Your weight today is 235lbs, we need to work on weight loss.  Continue to weight yourself daily and record your values.  Bring your weight to follow up visit.  If you notice a major increase despite the lasix, call the clinic [phone]  Please consider getting your shingles vaccine at your pharmacy  Treatment Goals:  Goals (1 Years of Data) as of 09/05/12         As of Today 07/31/12 06/29/12 06/01/12 06/01/12     Blood Pressure    . Blood Pressure < 140/90  137/77 133/68 146/72 145/70 152/69     Lifestyle    . Prevent Falls            Progress Toward Treatment Goals:  Treatment Goal 09/05/2012  Blood pressure at goal  Prevent falls improved    Self Care Goals & Plans:  Self Care Goal 09/05/2012  Manage my medications take my medicines as prescribed  Monitor my health -  Be physically active -    Care Management & Community Referrals:  Referral 07/31/2012  Referrals made for care management support social worker; [facility] case management program    Edema Edema is an abnormal build-up of fluids in tissues. Because this is partly dependent on gravity (water flows to the lowest place),  it is more common in the legs and thighs (lower extremities). It is also common in the looser tissues, like around the eyes. Painless swelling of the feet and ankles is common and increases as a person ages. It may affect both legs and may include the calves or even thighs. When squeezed, the fluid may move out of the affected area and may leave a dent for a few moments. CAUSES   Prolonged standing or sitting in one place for extended periods of time. Movement helps pump tissue fluid into the veins, and absence of movement prevents this, resulting in edema.  Varicose veins. The valves in the veins do not work as well as they should. This causes fluid to leak into the tissues.  Fluid and salt overload.  Injury, burn, or surgery to the leg, ankle, or foot, may damage veins and allow fluid to leak out.  Sunburn damages vessels. Leaky vessels allow fluid to go out into the sunburned tissues.  Allergies (from insect bites or stings, medications or chemicals) cause swelling by allowing vessels to become leaky.  Protein in the blood helps keep fluid in your vessels. Low protein, as in malnutrition, allows fluid to leak out.  Hormonal changes, including pregnancy and menstruation, cause fluid retention. This fluid may leak out of vessels and cause edema.  Medications that cause fluid retention. Examples are sex hormones, blood pressure medications, steroid treatment, or anti-depressants.  Some illnesses cause edema, especially heart failure, kidney disease, or liver disease.  Surgery that cuts veins or lymph nodes, such as surgery done for the heart or for breast cancer, may result in edema. DIAGNOSIS  Your caregiver is usually easily able to determine what is causing your swelling (edema) by simply asking what is wrong (getting a history) and examining you (doing a physical). Sometimes x-rays, EKG (electrocardiogram or heart tracing), and blood work may be done to evaluate for underlying medical  illness. TREATMENT  General treatment includes:  Leg elevation (or elevation of the affected body part).  Restriction of fluid intake.  Prevention of fluid overload.  Compression of the affected body part. Compression with elastic bandages or support stockings squeezes the tissues, preventing fluid from entering and forcing it back into the blood vessels.  Diuretics (also called water pills or fluid pills) pull fluid out of your body in the form of increased urination. These are effective in reducing the swelling, but can have side effects and must be used only under your caregiver's supervision. Diuretics are appropriate only for some types of edema. The specific treatment can be directed at any underlying causes discovered. Heart, liver, or kidney disease should be treated appropriately. HOME CARE INSTRUCTIONS   Elevate the legs (or affected body part) above the level of the heart, while lying down.  Avoid sitting or standing still for prolonged periods of time.  Avoid putting anything directly under the knees when lying down, and do not wear constricting clothing or garters on the upper legs.  Exercising the legs causes the fluid to work back into the veins and lymphatic channels. This may help the swelling go down.  The pressure applied by elastic bandages or support stockings can help reduce ankle swelling.  A low-salt diet may help reduce fluid retention and decrease the ankle swelling.  Take any medications exactly as prescribed. SEEK MEDICAL CARE IF:  Your edema is not responding to recommended treatments. SEEK IMMEDIATE MEDICAL CARE IF:   You develop shortness of breath or chest pain.  You cannot breathe when you lay down; or if, while lying down, you have to get up and go to the window to get your breath.  You are having increasing swelling without relief from treatment.  You develop a fever over 102 F (38.9 C).  You develop pain or redness in the areas that are  swollen.  Tell your caregiver right away if you have gained 3 lb/1.4 kg in 1 day or 5 lb/2.3 kg in a week. MAKE SURE YOU:   Understand these instructions.  Will watch your condition.  Will get help right away if you are not doing well or get worse. Document Released: 04/19/2005 Document Revised: 10/19/2011 Document Reviewed: 12/06/2007 [facility] Patient Information 2013 [place], [place].  Furosemide tablets What is this medicine? FUROSEMIDE (fyoor [place] se mide) is a diuretic. It helps you make more urine and to lose salt and excess water from your body. This medicine is used to treat high blood pressure, and edema or swelling from heart, kidney, or liver disease. This medicine may be used for other purposes; ask your health care provider or pharmacist if you have questions. What should I tell my health care provider before I take this medicine? They need to know if you have any of these conditions: -abnormal blood electrolytes -diarrhea or vomiting -gout -heart disease -kidney disease,  small amounts of urine, or difficulty passing urine -liver disease -an unusual or allergic reaction to furosemide, sulfa drugs, other medicines, foods, dyes, or preservatives -pregnant or trying to get pregnant -breast-feeding How should I use this medicine? Take this medicine by mouth with a glass of water. Follow the directions on the prescription label. You may take this medicine with or without food. If it upsets your stomach, take it with food or milk. Do not take your medicine more often than directed. Remember that you will need to pass more urine after taking this medicine. Do not take your medicine at a time of day that will cause you problems. Do not take at bedtime. Talk to your pediatrician regarding the use of this medicine in children. While this drug may be prescribed for selected conditions, precautions do apply. Overdosage: If you think you have taken too much of this medicine contact a  poison control center or emergency room at once. NOTE: This medicine is only for you. Do not share this medicine with others. What if I miss a dose? If you miss a dose, take it as soon as you can. If it is almost time for your next dose, take only that dose. Do not take double or extra doses. What may interact with this medicine? -aspirin and aspirin-like medicines -certain antibiotics -chloral hydrate -cisplatin -cyclosporine -digoxin -diuretics -laxatives -lithium -medicines for blood pressure -medicines that relax muscles for surgery -methotrexate -NSAIDs, medicines for pain and inflammation like ibuprofen, naproxen, or indomethacin -phenytoin -steroid medicines like prednisone or cortisone -sucralfate This list may not describe all possible interactions. Give your health care provider a list of all the medicines, herbs, non-prescription drugs, or dietary supplements you use. Also tell them if you smoke, drink alcohol, or use illegal drugs. Some items may interact with your medicine. What should I watch for while using this medicine? Visit your doctor or health care professional for regular checks on your progress. Check your blood pressure regularly. Ask your doctor or health care professional what your blood pressure should be, and when you should contact him or her. If you are a diabetic, check your blood sugar as directed. You may need to be on a special diet while taking this medicine. Check with your doctor. Also, ask how many glasses of fluid you need to drink a day. You must not get dehydrated. You may get drowsy or dizzy. Do not drive, use machinery, or do anything that needs mental alertness until you know how this drug affects you. Do not stand or sit up quickly, especially if you are an older patient. This reduces the risk of dizzy or fainting spells. Alcohol can make you more drowsy and dizzy. Avoid alcoholic drinks. This medicine can make you more sensitive to the sun. Keep  out of the sun. If you cannot avoid being in the sun, wear protective clothing and use sunscreen. Do not use sun lamps or tanning beds/booths. What side effects may I notice from receiving this medicine? Side effects that you should report to your doctor or health care professional as soon as possible: -blood in urine or stools -dry mouth -fever or chills -hearing loss or ringing in the ears -irregular heartbeat -muscle pain or weakness, cramps -skin rash -stomach upset, pain, or nausea -tingling or numbness in the hands or feet -unusually weak or tired -vomiting or diarrhea -yellowing of the eyes or skin Side effects that usually do not require medical attention (report to your doctor or health care professional  if they continue or are bothersome): -headache -loss of appetite -unusual bleeding or bruising This list may not describe all possible side effects. Call your doctor for medical advice about side effects. You may report side effects to FDA at 1-800-FDA-1088. Where should I keep my medicine? Keep out of the reach of children. Store at room temperature between 15 and 30 degrees C (59 and 86 degrees F). Protect from light. Throw away any unused medicine after the expiration date. NOTE: This sheet is a summary. It may not cover all possible information. If you have questions about this medicine, talk to your doctor, pharmacist, or health care provider.  2013, [employer]. (04/07/2009 4:24:50 PM)

## 2012-09-05 NOTE — Assessment & Plan Note (Signed)
Weight up 2 pounds today, 235lb.    -consider silver sneakers program as tolerated -started lasix 20mg  qd today -daily weights and bring to clinic recordings -portion control and decrease salt intake counseling given

## 2012-09-05 NOTE — Assessment & Plan Note (Signed)
Has prescription for zostavax but has not gotten the vaccine yet.   Claims to have had a colonoscopy by Dr. Elnoria Howard several years ago, will need to get records.  Refuses to get another colonoscopy at this time.

## 2012-09-05 NOTE — Assessment & Plan Note (Signed)
Continues to use tylenol and tramadol prn with some success to control pain.  -continue tramadol and tylenol.  Tramadol refilled today

## 2012-09-05 NOTE — Assessment & Plan Note (Signed)
Continues to have b/l lower extremity pitting edema and tenderness to palpation, chronic, left more tender than right.  No success from TED hose, does not like using them as they feel too tight.  No acute changes in swelling or pain.  Could be secondary to chronic venous insufficiency and obesity.  -continue to discuss proper elevation of legs while at rest and sitting -will start daily lasix 20mg  and recheck bmet in 1 month -discussed silver sneakers program -low salt diet

## 2012-09-05 NOTE — Progress Notes (Signed)
Subjective:   Patient ID: Lisa Haynes female   DOB: 04-04-1941 72 y.o.   MRN: [id number]  HPI: Lisa Haynes is a 72 y.o. obese white female with PMH of osteoporosis (unable to tolerate bisphosphonate tx), grade 1 diastolic dysfunction with ef 60-65% on echo 03/2010, well controlled HTN, and b/l lower extremity edema presenting to clinic today for follow up visit and medication refills.  Since our last visit, Ms. Montella did not stop taking Risedronate and resulted in another episode of abdominal pain and nausea after taking her April dose.  We have discussed again today to stop the medication and she will not get any more refills and continue to monitor for return of symptoms.  The pain and GI upset spontaneously resolved and has not returned.  She thinks it is all related to the medication.  Of note, she was noted to have elevated ALP and AST on last clinic visit and we will repeat CMET today to monitor.    She continues to have lower extremity edema and tenderness to palpation.  The TED hose did not help and she does elevate her legs as much as possible.  We have discussed extensively weight loss, sodium restriction at home, eating less processed meat and foods and will start lasix today to see if that helps at all.  She knows to call the clinic or go to EF if notices any worsening of swelling or pain in any leg.  Her weight is up 2 pounds today at 235lb.   In regards to her mood, she feels much better today.  Things are well at home she claims and her son is living with her, cooking for her and taking care of her.  She is here in clinic today with her ex-daughter in law and is good spirits.  She denies any suicidal or homicidal ideation.  She has completed physical therapy at home and continues to be independent in her ADLs.    Past Medical History  Diagnosis Date  . Bilateral knee pain   . Depression   . Hypertension   . Low back pain   . Osteoarthritis   . Osteoporosis   .  Hypothyroidism   . Pneumonia     hx of PNA 2013  . Incontinence    Current Outpatient Prescriptions  Medication Sig Dispense Refill  . acetaminophen (TYLENOL) 325 MG tablet Take 2 tablets (650 mg total) by mouth every 6 (six) hours as needed for pain.  60 tablet  3  . busPIRone (BUSPAR) 10 MG tablet Take 0.5-1 tablets (5-10 mg total) by mouth 2 (two) times daily. Take 5 mg every morning and 10 mg at night.  30 tablet  0  . Calcium Carbonate-Vitamin D (CALCARB 600/D) 600-400 MG-UNIT per tablet Take 1 tablet by mouth 3 (three) times daily with meals.  30 tablet  2  . levothyroxine (SYNTHROID, LEVOTHROID) 25 MCG tablet Take 1 tablet (25 mcg total) by mouth daily.  30 tablet  6  . lisinopril (PRINIVIL,ZESTRIL) 10 MG tablet Take 10 mg by mouth daily.      Marland Kitchen omeprazole (PRILOSEC) 40 MG capsule Take 1 capsule (40 mg total) by mouth daily.  40 capsule  11  . simvastatin (ZOCOR) 10 MG tablet Take 1 tablet (10 mg total) by mouth at bedtime.  90 tablet  4  . traMADol (ULTRAM) 50 MG tablet Take 1 tablet (50 mg total) by mouth every 12 (twelve) hours as needed.  30 tablet  1  .  furosemide (LASIX) 20 MG tablet Take 1 tablet (20 mg total) by mouth daily.  30 tablet  1  . zoster vaccine live, PF, (ZOSTAVAX) 16109 UNT/0.65ML injection Inject 19,400 Units into the skin once.  1 each  0  . [DISCONTINUED] omeprazole (PRILOSEC) 40 MG capsule Take 1 capsule (40 mg total) by mouth daily.  30 capsule  11   No current facility-administered medications for this visit.   No family history on file. History   Social History  . Marital Status: Legally Separated    Spouse Name: N/A    Number of Children: N/A  . Years of Education: N/A   Occupational History  . Retired    Social History Main Topics  . Smoking status: Never Smoker   . Smokeless tobacco: Never Used  . Alcohol Use: No  . Drug Use: No  . Sexually Active: None   Other Topics Concern  . None   Social History Narrative   Retired: Motel work    Divorced    Regular exercise- no   Lives alone   Review of Systems: Constitutional: Denies fever, chills, diaphoresis, appetite change and fatigue.  HEENT: Denies photophobia, eye pain, redness, hearing loss, ear pain, congestion, sore throat, rhinorrhea, sneezing, mouth sores, trouble swallowing, neck pain, neck stiffness and tinnitus.   Respiratory: Denies SOB, DOE, cough, chest tightness,  and wheezing.   Cardiovascular: Denies chest pain, palpitations and leg swelling.  Gastrointestinal: Denies nausea, vomiting, abdominal pain, diarrhea, constipation, blood in stool and abdominal distention.  Genitourinary: stress incontinence.  Denies dysuria, frequency, hematuria, flank pain and difficulty urinating.  Endocrine: Denies: hot or cold intolerance, sweats, changes in hair or nails, polyuria, polydipsia. Musculoskeletal: chronic back lower extremity pain, lower extremity edema. Uses wheelchair.  Skin: Denies pallor, rash and wound.  Neurological: Denies dizziness, seizures, syncope, weakness, light-headedness, numbness and headaches.  Hematological: Denies adenopathy. Easy bruising, personal or family bleeding history  Psychiatric/Behavioral: Denies suicidal ideation, mood changes, confusion, nervousness, sleep disturbance and agitation  Objective:  Physical Exam: Filed Vitals:   09/05/12 1421  BP: 137/77  Pulse: 89  Temp: 98.3 F (36.8 C)  TempSrc: Oral  Weight: 235 lb 4.8 oz (106.731 kg)  SpO2: 97%   General: AAOX3, NAD Head: Normocephalic and atraumatic.  Eyes: Vision grossly intact, PERRLA, EOMI  Mouth: Pharynx pink and moist, no erythema, and no exudates.  Neck: Supple, full ROM Lungs: Normal respiratory effort, CTA B/L, no accessory muscle use, no crackles, and no wheezes. Heart: Normal rate, regular rhythm, no murmur, no gallop, and no rub.  Abdomen: Soft, non-tender, normal bowel sounds, non-distended, obese Msk: +2 pitting edema b/l lower extremities, dry skin,  darkened skin, no visible ulcers, tender to palpation b/l up to level of knee, L>R tenderness.   Pulses: 2+ DP/PT pulses bilaterally Extremities: No cyanosis, clubbing, edema Neurologic: Alert & oriented X3, cranial nerves II-XII intact, strength normal in all extremities, sensation intact to light touch, and gait normal.  Skin: Turgor normal and no rashes.  Psych: Oriented X3, memory intact for recent and remote, normally interactive, good eye contact, not anxious appearing, and not depressed appearing. Assessment & Plan:  Discussed with Dr. Aundria Rud  LE edema: will start lasix 20mg  qd and recheck bmet in 1 month. Elevation of legs and limit salt intake. Daily weights at home.   Abdominal pain and transaminitis: possibly secondary to Actonel.  D/C Actonel use (pt continued to use although discontinued), monitor, repeat cmet today.

## 2012-09-05 NOTE — Assessment & Plan Note (Signed)
Remains independent in ADLs with good support at home with son who lives with her and is primary caretaker.  Reports no recent falls, uses support of cane and walker and wheelchair at home.  Claims physical therapy has finished at her home.    -inquire on termination of physical therapy -continue to move with assistance at home -need to lose weight, counseled extensively on nutrition and portion control -consider silver sneakers program

## 2012-09-05 NOTE — Assessment & Plan Note (Signed)
Stopped risedronate due to GI upset and crampy abdominal pain complaints.  Also noted to have transaminitis on cmet last visit with complaints.  Continues to take April dose despite being told to stop taking medication on last visit.  -stop Risedronate and monitor -continue calcium and vitamin d supplementation

## 2012-09-07 NOTE — Progress Notes (Signed)
Case discussed with Dr. Lindalou Hose  (at time of visit, soon after the resident saw the patient).  We reviewed the resident's history and exam and pertinent patient test results.  I agree with the assessment, diagnosis, and plan of care documented in the resident's note.

## 2012-10-11 ENCOUNTER — Other Ambulatory Visit: Payer: Self-pay | Admitting: *Deleted

## 2012-10-11 MED ORDER — FUROSEMIDE 20 MG PO TABS: 20.0000 mg | ORAL_TABLET | Freq: Every day | ORAL | Status: DC

## 2012-10-17 ENCOUNTER — Encounter: Payer: Self-pay | Admitting: Internal Medicine

## 2012-10-17 ENCOUNTER — Ambulatory Visit (INDEPENDENT_AMBULATORY_CARE_PROVIDER_SITE_OTHER): Payer: Medicare Other | Admitting: Internal Medicine

## 2012-10-17 VITALS — BP 160/93 | HR 77 | Temp 98.1°F | Wt 233.8 lb

## 2012-10-17 DIAGNOSIS — R609 Edema, unspecified: Secondary | ICD-10-CM

## 2012-10-17 DIAGNOSIS — I10 Essential (primary) hypertension: Secondary | ICD-10-CM | POA: Diagnosis not present

## 2012-10-17 DIAGNOSIS — M545 Low back pain, unspecified: Secondary | ICD-10-CM | POA: Diagnosis not present

## 2012-10-17 DIAGNOSIS — R600 Localized edema: Secondary | ICD-10-CM

## 2012-10-17 MED ORDER — TRAMADOL HCL 50 MG PO TABS: 50.0000 mg | ORAL_TABLET | Freq: Every day | ORAL | Status: DC | PRN

## 2012-10-17 MED ORDER — LUMBAR BACK BRACE/SUPPORT PAD MISC: 1.0000 | Freq: Once | Status: DC

## 2012-10-17 MED ORDER — FUROSEMIDE 20 MG PO TABS: 20.0000 mg | ORAL_TABLET | Freq: Every day | ORAL | Status: DC

## 2012-10-17 NOTE — Patient Instructions (Addendum)
General Instructions: Please continue your lasix 20mg  daily along with lisinopril for your blood pressure and do not skip your doses  Consider getting a mammogram and colonoscopy  Continue to elevate your legs at home  Monitor your blood pressure at home   Treatment Goals:  Goals (1 Years of Data) as of 10/17/12         As of Today 09/05/12 07/31/12 06/29/12 06/01/12     Blood Pressure    . Blood Pressure < 140/90  160/93 137/77 133/68 146/72 145/70     Lifestyle    . Prevent Falls            Progress Toward Treatment Goals:  Treatment Goal 10/17/2012  Blood pressure deteriorated  Prevent falls improved    Self Care Goals & Plans:  Self Care Goal 10/17/2012  Manage my medications take my medicines as prescribed  Monitor my health keep track of my blood glucose  Eat healthy foods eat smaller portions  Be physically active -  Meeting treatment goals maintain the current self-care plan       Care Management & Community Referrals:  Referral 07/31/2012  Referrals made for care management support social worker; [facility] case management program

## 2012-10-18 LAB — COMPREHENSIVE METABOLIC PANEL
ALT: 17 U/L (ref 0–35)
BUN: 13 mg/dL (ref 6–23)
CO2: 26 mEq/L (ref 19–32)
Creat: 0.88 mg/dL (ref 0.50–1.10)
Glucose, Bld: 87 mg/dL (ref 70–99)
Total Bilirubin: 0.6 mg/dL (ref 0.3–1.2)

## 2012-10-22 NOTE — Assessment & Plan Note (Signed)
Improved lower extremity swelling since last month.    -continue lasix 20mg  qd -repeat cmet--all within normal limits

## 2012-10-22 NOTE — Progress Notes (Signed)
Subjective:   Patient ID: Lisa Haynes female   DOB: 06/28/1940 72 y.o.   MRN: [id number]  HPI: Lisa Haynes is a 72 y.o. obese white female with PMH of osteoporosis (unable to tolerate bisphosphonate tx), grade 1 diastolic dysfunction with ef 60-65% on echo 03/2010, well controlled HTN, and b/l lower extremity edema presenting to clinic today for follow up visit.  She has been adherent with lasix and reports improved lower extremity swelling today.  She claims to feel well with no complaints at this time.  We will recheck a CMET today since starting lasix.    We discussed health maintenance today.  She refuses to get a colonoscopy, claiming she had one several years ago and will not have another.  She thinks she had it done with Dr. Haywood Pao office and we will try to obtain records.  She is however in agreement for the occult cards and will send them in.  She cannot afford the zostavax vaccine at this time.  Finally, we discussed the importance of mammogram's however, Lisa Haynes refuses to get anymore mammograms claiming she does not like how they are done.  I explained the benefits of mammography including screening and early diagnosis, however, she continued to refuse.     Past Medical History  Diagnosis Date  . Bilateral knee pain   . Depression   . Hypertension   . Low back pain   . Osteoarthritis   . Osteoporosis   . Hypothyroidism   . Pneumonia     hx of PNA 2013  . Incontinence    Current Outpatient Prescriptions  Medication Sig Dispense Refill  . busPIRone (BUSPAR) 10 MG tablet Take 0.5-1 tablets (5-10 mg total) by mouth 2 (two) times daily. Take 5 mg every morning and 10 mg at night.  30 tablet  0  . Calcium Carbonate-Vitamin D (CALCARB 600/D) 600-400 MG-UNIT per tablet Take 1 tablet by mouth 3 (three) times daily with meals.  30 tablet  2  . furosemide (LASIX) 20 MG tablet Take 1 tablet (20 mg total) by mouth daily.  30 tablet  3  . levothyroxine (SYNTHROID, LEVOTHROID)  25 MCG tablet Take 1 tablet (25 mcg total) by mouth daily.  30 tablet  6  . lisinopril (PRINIVIL,ZESTRIL) 10 MG tablet Take 10 mg by mouth daily.      Marland Kitchen omeprazole (PRILOSEC) 40 MG capsule Take 1 capsule (40 mg total) by mouth daily.  40 capsule  11  . simvastatin (ZOCOR) 10 MG tablet Take 1 tablet (10 mg total) by mouth at bedtime.  90 tablet  4  . traMADol (ULTRAM) 50 MG tablet Take 1 tablet (50 mg total) by mouth daily as needed.  30 tablet  3  . zoster vaccine live, PF, (ZOSTAVAX) 84696 UNT/0.65ML injection Inject 19,400 Units into the skin once.  1 each  0  . acetaminophen (TYLENOL) 325 MG tablet Take 2 tablets (650 mg total) by mouth every 6 (six) hours as needed for pain.  60 tablet  3  . Elastic Bandages & Supports (LUMBAR BACK BRACE/SUPPORT PAD) MISC 1 each by Does not apply route once.  1 each  0  . [DISCONTINUED] omeprazole (PRILOSEC) 40 MG capsule Take 1 capsule (40 mg total) by mouth daily.  30 capsule  11   No current facility-administered medications for this visit.   No family history on file. History   Social History  . Marital Status: Legally Separated    Spouse Name: N/A  Number of Children: N/A  . Years of Education: N/A   Occupational History  . Retired    Social History Main Topics  . Smoking status: Never Smoker   . Smokeless tobacco: Never Used  . Alcohol Use: No  . Drug Use: No  . Sexually Active: None   Other Topics Concern  . None   Social History Narrative   Retired: Motel work   Divorced    Regular exercise- no   Lives alone   Review of Systems:  Constitutional:  Denies fever, chills, diaphoresis, appetite change and fatigue.   HEENT:  Denies congestion, sore throat, rhinorrhea, sneezing, mouth sores, trouble swallowing, neck pain   Respiratory:  DOE.  Denies SOB, cough, and wheezing.   Cardiovascular:  B/l leg swelling.  Denies palpitations/     Gastrointestinal:  Denies nausea, vomiting, abdominal pain, diarrhea, constipation, blood in  stool and abdominal distention.   Genitourinary:  Denies dysuria, urgency, frequency, hematuria, flank pain and difficulty urinating.   Musculoskeletal:  Denies myalgias, back pain, joint swelling, arthralgias and gait problem.   Skin:  Denies pallor, rash and wound.   Neurological:  Denies dizziness, seizures, syncope, weakness, light-headedness, numbness and headaches.    Objective:  Physical Exam: Filed Vitals:   10/17/12 1602  BP: 160/93  Pulse: 77  Temp: 98.1 F (36.7 C)  TempSrc: Oral  Weight: 233 lb 12.8 oz (106.051 kg)  SpO2: 97%   Vitals reviewed. General: AAOX3, NAD HEENT: PERRLA, EOMI  Neck: Supple, full ROM Lungs: Normal respiratory effort, CTA B/L, no accessory muscle use, no crackles, and no wheezes. Heart: Normal rate, regular rhythm, no murmur, no gallop, and no rub.  Abdomen: Soft, non-tender, normal bowel sounds, non-distended, obese Msk: +1 pitting edema b/l lower extremities, tender to deep palpation b/l up to level of knee Pulses: 2+ DP bilaterally Extremities: No cyanosis, clubbing, B/L +1 pitting edema b/l Neurologic: Alert & oriented X3, cranial nerves II-XII intact, strength normal in all extremities, sensation intact to light touch, and gait normal.  Skin: Turgor normal and no rashes.  Psych: Oriented X3, memory intact for recent and remote, normally interactive, good eye contact, not anxious appearing, and not depressed appearing.  Assessment & Plan:  Case discussed with Dr. Criselda Peaches Continue lasix Repeat bmet Stool occult cards

## 2012-10-22 NOTE — Assessment & Plan Note (Signed)
BP Readings from Last 3 Encounters:  10/17/12 160/93  09/05/12 137/77  07/31/12 133/68   Lab Results  Component Value Date   NA 141 10/17/2012   K 4.1 10/17/2012   CREATININE 0.88 10/17/2012    Assessment: Blood pressure control: moderately elevated Progress toward BP goal:  deteriorated Comments: did not take her bp medications today  Plan: Medications:  Continue current medications: lasix and lisinopril Educational resources provided: brochure Self management tools provided: home blood pressure logbook

## 2012-10-22 NOTE — Assessment & Plan Note (Signed)
Continue tramadol 

## 2012-10-23 NOTE — Progress Notes (Signed)
Case discussed with Dr. Qureshi at the time of the visit.  We reviewed the resident's history and exam and pertinent patient test results.  I agree with the assessment, diagnosis, and plan of care documented in the resident's note. 

## 2012-10-31 ENCOUNTER — Other Ambulatory Visit: Payer: Medicare Other

## 2012-10-31 DIAGNOSIS — Z1211 Encounter for screening for malignant neoplasm of colon: Secondary | ICD-10-CM

## 2012-10-31 LAB — POC HEMOCCULT BLD/STL (HOME/3-CARD/SCREEN)
Card #2 Fecal Occult Blod, POC: NEGATIVE
Fecal Occult Blood, POC: NEGATIVE

## 2012-11-11 ENCOUNTER — Other Ambulatory Visit: Payer: Self-pay | Admitting: Internal Medicine

## 2012-11-13 ENCOUNTER — Other Ambulatory Visit: Payer: Self-pay | Admitting: *Deleted

## 2012-11-13 DIAGNOSIS — F064 Anxiety disorder due to known physiological condition: Secondary | ICD-10-CM

## 2012-11-13 MED ORDER — BUSPIRONE HCL 10 MG PO TABS: 5.0000 mg | ORAL_TABLET | Freq: Two times a day (BID) | ORAL | Status: DC

## 2012-11-13 NOTE — Telephone Encounter (Signed)
Pt called about med - has been taking med - took last half tablet this AM.  Stanton Kidney Zofia Peckinpaugh RN 11/13/12 12:15PM

## 2012-11-13 NOTE — Telephone Encounter (Signed)
Pt aware.

## 2012-11-13 NOTE — Telephone Encounter (Signed)
Has not refilled since February it seems.  If she is not taking it she likely doesn't need it.  Does she use it?

## 2012-11-24 ENCOUNTER — Encounter: Payer: Self-pay | Admitting: Internal Medicine

## 2012-12-08 ENCOUNTER — Other Ambulatory Visit: Payer: Self-pay | Admitting: Internal Medicine

## 2012-12-11 NOTE — Telephone Encounter (Signed)
She has 3 refills on her lasix since June 2014

## 2013-01-07 ENCOUNTER — Other Ambulatory Visit: Payer: Self-pay | Admitting: Internal Medicine

## 2013-01-08 NOTE — Telephone Encounter (Signed)
Please schedule a follow-up appointment with patient's PCP. 

## 2013-02-07 ENCOUNTER — Other Ambulatory Visit: Payer: Self-pay | Admitting: Internal Medicine

## 2013-02-13 ENCOUNTER — Encounter: Payer: Medicare Other | Admitting: Internal Medicine

## 2013-02-14 ENCOUNTER — Other Ambulatory Visit: Payer: Self-pay | Admitting: *Deleted

## 2013-02-14 MED ORDER — LISINOPRIL 10 MG PO TABS: 10.0000 mg | ORAL_TABLET | Freq: Every day | ORAL | Status: DC

## 2013-02-14 NOTE — Telephone Encounter (Signed)
Is pt still taking Buspar??

## 2013-02-14 NOTE — Telephone Encounter (Signed)
Per last clinic visit, buspar listed on med list.  Were you asking me if she is still taking it or the patient? Likely patient should be asked.  Thanks,  Dr q

## 2013-03-11 ENCOUNTER — Other Ambulatory Visit: Payer: Self-pay | Admitting: Internal Medicine

## 2013-04-03 ENCOUNTER — Encounter: Payer: Self-pay | Admitting: Internal Medicine

## 2013-04-03 ENCOUNTER — Ambulatory Visit (INDEPENDENT_AMBULATORY_CARE_PROVIDER_SITE_OTHER): Payer: Medicare Other | Admitting: Internal Medicine

## 2013-04-03 VITALS — BP 144/76 | HR 74 | Temp 97.2°F | Wt 233.1 lb

## 2013-04-03 DIAGNOSIS — R112 Nausea with vomiting, unspecified: Secondary | ICD-10-CM | POA: Diagnosis not present

## 2013-04-03 DIAGNOSIS — M810 Age-related osteoporosis without current pathological fracture: Secondary | ICD-10-CM | POA: Diagnosis not present

## 2013-04-03 DIAGNOSIS — R609 Edema, unspecified: Secondary | ICD-10-CM

## 2013-04-03 DIAGNOSIS — I10 Essential (primary) hypertension: Secondary | ICD-10-CM

## 2013-04-03 DIAGNOSIS — Z0000 Encounter for general adult medical examination without abnormal findings: Secondary | ICD-10-CM | POA: Diagnosis not present

## 2013-04-03 DIAGNOSIS — R7401 Elevation of levels of liver transaminase levels: Secondary | ICD-10-CM | POA: Diagnosis not present

## 2013-04-03 DIAGNOSIS — E0590 Thyrotoxicosis, unspecified without thyrotoxic crisis or storm: Secondary | ICD-10-CM

## 2013-04-03 DIAGNOSIS — R5381 Other malaise: Secondary | ICD-10-CM | POA: Diagnosis not present

## 2013-04-03 DIAGNOSIS — R600 Localized edema: Secondary | ICD-10-CM

## 2013-04-03 DIAGNOSIS — Z23 Encounter for immunization: Secondary | ICD-10-CM

## 2013-04-03 DIAGNOSIS — M545 Low back pain, unspecified: Secondary | ICD-10-CM | POA: Diagnosis not present

## 2013-04-03 LAB — BASIC METABOLIC PANEL
CO2: 27 mEq/L (ref 19–32)
Calcium: 10 mg/dL (ref 8.4–10.5)
Creat: 0.89 mg/dL (ref 0.50–1.10)
Sodium: 139 mEq/L (ref 135–145)

## 2013-04-03 NOTE — Assessment & Plan Note (Signed)
Improved with lasix.   -continue lasix for now

## 2013-04-03 NOTE — Patient Instructions (Addendum)
General Instructions:  Your lower extremity swelling looks much better, good job! Keep elevating your legs and taking your BP meds  Please try to increase your activity, use your can and walker at all times and walk with assitance  Please try to lose weight and work on your diet as we discussed  You have received your flu vaccine today  We are checking your thyroid level and will call in medicine as needed  Return in 4-6 months  Treatment Goals:  Goals (1 Years of Data) as of 04/03/13         As of Today 10/17/12 09/05/12 07/31/12 06/29/12     Blood Pressure    . Blood Pressure < 140/90  144/76 160/93 137/77 133/68 146/72     Lifestyle    . Prevent Falls            Progress Toward Treatment Goals:  Treatment Goal 04/03/2013  Blood pressure improved  Prevent falls -    Self Care Goals & Plans:  Self Care Goal 04/03/2013  Manage my medications take my medicines as prescribed; bring my medications to every visit; refill my medications on time  Monitor my health -  Eat healthy foods -  Be physically active -  Meeting treatment goals -    No flowsheet data found.   Care Management & Community Referrals:  Referral 07/31/2012  Referrals made for care management support social worker; [facility] case management program

## 2013-04-03 NOTE — Progress Notes (Signed)
Subjective:   Patient ID: Lisa Haynes female   DOB: 11/22/1940 72 y.o.   MRN: [id number]  HPI: Lisa Haynes is a 73 y.o. obese white female with PMH of osteoporosis, grade 1 diastolic dysfunction with ef 60-65% on echo 03/2010, and well controlled HTN presenting to clinic today for follow up visit. Lisa Haynes has no major complaints today and would like her flu vaccine.  She continues to refuse health maintenance including mammogram and colonoscopy but she did do the hemoccult cards which were negative in June 2014.    Upon review of her medications, it seems Lisa Haynes has not been taking levothyroxine and she does not recall the lat time she did take it.  Her last TSH was from March 2014 2.887. We will need to recheck TSH and likely restart medication. Additionally, she has also not been taking Buspar and does not think she needs it as her anxiety is well controlled.       Past Medical History  Diagnosis Date  . Bilateral knee pain   . Depression   . Hypertension   . Low back pain   . Osteoarthritis   . Osteoporosis   . Hypothyroidism   . Pneumonia     hx of PNA 2013  . Incontinence    Current Outpatient Prescriptions  Medication Sig Dispense Refill  . acetaminophen (TYLENOL) 325 MG tablet Take 2 tablets (650 mg total) by mouth every 6 (six) hours as needed for pain.  60 tablet  3  . busPIRone (BUSPAR) 10 MG tablet Take 0.5-1 tablets (5-10 mg total) by mouth 2 (two) times daily. Take 5 mg every morning and 10 mg at night.  30 tablet  0  . Calcium Carbonate-Vitamin D (CALCARB 600/D) 600-400 MG-UNIT per tablet Take 1 tablet by mouth 3 (three) times daily with meals.  30 tablet  2  . Elastic Bandages & Supports (LUMBAR BACK BRACE/SUPPORT PAD) MISC 1 each by Does not apply route once.  1 each  0  . furosemide (LASIX) 20 MG tablet TAKE 1 TABLET BY MOUTH DAILY  30 tablet  3  . levothyroxine (SYNTHROID, LEVOTHROID) 25 MCG tablet Take 1 tablet (25 mcg total) by mouth daily.  30  tablet  6  . lisinopril (PRINIVIL,ZESTRIL) 10 MG tablet Take 1 tablet (10 mg total) by mouth daily.  30 tablet  5  . omeprazole (PRILOSEC) 40 MG capsule Take 1 capsule (40 mg total) by mouth daily.  40 capsule  11  . simvastatin (ZOCOR) 10 MG tablet Take 1 tablet (10 mg total) by mouth at bedtime.  90 tablet  4  . traMADol (ULTRAM) 50 MG tablet Take 1 tablet (50 mg total) by mouth daily as needed.  30 tablet  3  . zoster vaccine live, PF, (ZOSTAVAX) 40981 UNT/0.65ML injection Inject 19,400 Units into the skin once.  1 each  0   No current facility-administered medications for this visit.   No family history on file. History   Social History  . Marital Status: Legally Separated    Spouse Name: N/A    Number of Children: N/A  . Years of Education: N/A   Occupational History  . Retired    Social History Main Topics  . Smoking status: Never Smoker   . Smokeless tobacco: Never Used  . Alcohol Use: No  . Drug Use: No  . Sexual Activity: None   Other Topics Concern  . None   Social History Narrative   Retired:  Motel work   Divorced    Regular exercise- no   Lives alone   Review of Systems:  Constitutional:  Denies fever, chills, diaphoresis, appetite change and fatigue.   HEENT:  Denies congestion, sore throat  Respiratory:  DOE. Denies SOB, cough, and wheezing.   Cardiovascular:  Denies chest pain, palpitations, and leg swelling.   Gastrointestinal:  Denies nausea, vomiting, abdominal pain   Genitourinary:  Denies dysuria, urgency, frequency, hematuria, flank pain and difficulty urinating.   Musculoskeletal:  Lower extremity pain, arthralgias, joint pian, and gait problem.   Skin:  Denies pallor, rash and wound.   Neurological:  Denies dizziness and headaches.    Objective:  Physical Exam: Filed Vitals:   04/03/13 1500  BP: 144/76  Pulse: 74  Temp: 97.2 F (36.2 C)  TempSrc: Oral  Weight: 233 lb 1.6 oz (105.733 kg)  SpO2: 98%   Vitals reviewed. General: sitting  in chair, NAD HEENT: PERRL, EOMI Cardiac: RRR, no rubs, murmurs or gallops Pulm: clear to auscultation bilaterally, no wheezes, rales, or rhonchi Abd: soft, obese, nontender, nondistended, BS present Ext: warm and well perfused, +1 b/l pitting edema, +2DP B/L, tenderness to palpation b/l lower extremities Neuro: alert and oriented X3, cranial nerves II-XII grossly intact, strength equal in bilateral upper and lower extremities  Assessment & Plan:  Discussed with Dr. Criselda Peaches Flu vaccine given today

## 2013-04-03 NOTE — Assessment & Plan Note (Signed)
BP Readings from Last 3 Encounters:  04/03/13 144/76  10/17/12 160/93  09/05/12 137/77   Lab Results  Component Value Date   NA 141 10/17/2012   K 4.1 10/17/2012   CREATININE 0.88 10/17/2012   Assessment: Blood pressure control: mildly elevated Progress toward BP goal:  improved Comments: on laisx and lisinopril   Plan: Medications:  Continue current medications including lasix and lisinopril

## 2013-04-03 NOTE — Assessment & Plan Note (Signed)
Flu vaccine today 

## 2013-04-04 LAB — TSH: TSH: 3.935 u[IU]/mL (ref 0.350–4.500)

## 2013-04-05 NOTE — Progress Notes (Signed)
Case discussed with Dr. Qureshi soon after the resident saw the patient.  We reviewed the resident's history and exam and pertinent patient test results.  I agree with the assessment, diagnosis, and plan of care documented in the resident's note. 

## 2013-07-03 ENCOUNTER — Other Ambulatory Visit: Payer: Self-pay | Admitting: Internal Medicine

## 2013-07-06 ENCOUNTER — Other Ambulatory Visit: Payer: Self-pay | Admitting: Internal Medicine

## 2013-08-14 ENCOUNTER — Other Ambulatory Visit: Payer: Self-pay | Admitting: Internal Medicine

## 2013-09-14 ENCOUNTER — Other Ambulatory Visit: Payer: Self-pay | Admitting: Internal Medicine

## 2013-09-14 NOTE — Telephone Encounter (Signed)
This was filled in April 2015 for 90 day supplies with 3 refills.   Thanks,  Dr q

## 2013-10-02 ENCOUNTER — Other Ambulatory Visit: Payer: Self-pay | Admitting: Internal Medicine

## 2013-11-06 ENCOUNTER — Encounter: Payer: Medicare Other | Admitting: Internal Medicine

## 2013-11-13 ENCOUNTER — Encounter: Payer: Medicare Other | Admitting: Internal Medicine

## 2013-11-20 ENCOUNTER — Encounter: Payer: Self-pay | Admitting: Internal Medicine

## 2013-11-20 ENCOUNTER — Ambulatory Visit (INDEPENDENT_AMBULATORY_CARE_PROVIDER_SITE_OTHER): Payer: Medicare Other | Admitting: Internal Medicine

## 2013-11-20 VITALS — BP 146/92 | HR 89 | Temp 97.8°F | Wt 236.5 lb

## 2013-11-20 DIAGNOSIS — R600 Localized edema: Secondary | ICD-10-CM

## 2013-11-20 DIAGNOSIS — R609 Edema, unspecified: Secondary | ICD-10-CM | POA: Diagnosis not present

## 2013-11-20 DIAGNOSIS — I10 Essential (primary) hypertension: Secondary | ICD-10-CM | POA: Diagnosis not present

## 2013-11-20 DIAGNOSIS — E039 Hypothyroidism, unspecified: Secondary | ICD-10-CM

## 2013-11-20 DIAGNOSIS — Z0000 Encounter for general adult medical examination without abnormal findings: Secondary | ICD-10-CM

## 2013-11-20 DIAGNOSIS — F418 Other specified anxiety disorders: Secondary | ICD-10-CM

## 2013-11-20 DIAGNOSIS — F341 Dysthymic disorder: Secondary | ICD-10-CM

## 2013-11-20 DIAGNOSIS — R5381 Other malaise: Secondary | ICD-10-CM | POA: Diagnosis not present

## 2013-11-20 MED ORDER — SERTRALINE HCL 25 MG PO TABS: 25.0000 mg | ORAL_TABLET | Freq: Every day | ORAL | Status: DC

## 2013-11-20 NOTE — Patient Instructions (Addendum)
General Instructions:  Dear Ms. Arviso,  I will have Ms. Mosetta Putt our social worker contact you in regards to possible personal care services and home health assessment.   We will start zoloft today for your depression. Start with 25mg  daily and titrate up to 50mg  daily in 1-2 weeks. Improvement should be noted starting in 4-6 weeks hopefully. In the meantime, if you have worsening depression or thoughts of suicide or homicide please call me right away as we may need to discontinue the medication. Please read the below details in regards to starting this medication. It should help with the depression and anxiety.   Please reconsider meeting with psychiatry but you are always welcome to call myself or my office if your depression ever gets severe or worse at any time.   Continue your lasix every other day for now. Use precautions when walking to prevent falls. Use your walker and cane at all times.   Please bring your medicines with you each time you come to clinic.  Medicines may include prescription medications, over-the-counter medications, herbal remedies, eye drops, vitamins, or other pills.   Progress Toward Treatment Goals:  Treatment Goal 11/20/2013  Blood pressure deteriorated  Prevent falls unchanged    Self Care Goals & Plans:  Self Care Goal 04/03/2013  Manage my medications take my medicines as prescribed; bring my medications to every visit; refill my medications on time  Monitor my health -  Eat healthy foods eat foods that are low in salt; eat smaller portions  Be physically active -  Meeting treatment goals -    No flowsheet data found.  Care Management & Community Referrals:  Referral 07/31/2012  Referrals made for care management support social worker; [facility] case management program     Sertraline tablets What is this medicine? SERTRALINE (SER tra leen) is used to treat depression. It may also be used to treat obsessive compulsive disorder, panic disorder,  post-trauma stress, premenstrual dysphoric disorder (PMDD) or social anxiety. This medicine may be used for other purposes; ask your health care provider or pharmacist if you have questions. COMMON BRAND NAME(S): Zoloft What should I tell my health care provider before I take this medicine? They need to know if you have any of these conditions: -bipolar disorder or a family history of bipolar disorder -diabetes -glaucoma -heart disease -high blood pressure -history of irregular heartbeat -history of low levels of calcium, magnesium, or potassium in the blood -if you often drink alcohol -liver disease -receiving electroconvulsive therapy -seizures -suicidal thoughts, plans, or attempt; a previous suicide attempt by you or a family member -thyroid disease -an unusual or allergic reaction to sertraline, other medicines, foods, dyes, or preservatives -pregnant or trying to get pregnant -breast-feeding How should I use this medicine? Take this medicine by mouth with a glass of water. Follow the directions on the prescription label. You can take it with or without food. Take your medicine at regular intervals. Do not take your medicine more often than directed. Do not stop taking this medicine suddenly except upon the advice of your doctor. Stopping this medicine too quickly may cause serious side effects or your condition may worsen. A special MedGuide will be given to you by the pharmacist with each prescription and refill. Be sure to read this information carefully each time. Talk to your pediatrician regarding the use of this medicine in children. While this drug may be prescribed for children as young as 7 years for selected conditions, precautions do apply. Overdosage: If  you think you have taken too much of this medicine contact a poison control center or emergency room at once. NOTE: This medicine is only for you. Do not share this medicine with others. What if I miss a dose? If you  miss a dose, take it as soon as you can. If it is almost time for your next dose, take only that dose. Do not take double or extra doses. What may interact with this medicine? Do not take this medicine with any of the following medications: -certain medicines for fungal infections like fluconazole, itraconazole, ketoconazole, posaconazole, voriconazole -cisapride -disulfiram -dofetilide -linezolid -MAOIs like Carbex, Eldepryl, Marplan, Nardil, and Parnate -metronidazole -methylene blue (injected into a vein) -pimozide -thioridazine -ziprasidone This medicine may also interact with the following medications: -alcohol -aspirin and aspirin-like medicines -certain medicines for depression, anxiety, or psychotic disturbances -certain medicines for irregular heart beat like flecainide, propafenone -certain medicines for migraine headaches like almotriptan, eletriptan, frovatriptan, naratriptan, rizatriptan, sumatriptan, zolmitriptan -certain medicines for sleep -certain medicines for seizures like carbamazepine, valproic acid, phenytoin -certain medicines that treat or prevent blood clots like warfarin, enoxaparin, dalteparin -cimetidine -digoxin -diuretics -fentanyl -furazolidone -isoniazid -lithium -NSAIDs, medicines for pain and inflammation, like ibuprofen or naproxen -other medicines that prolong the QT interval (cause an abnormal heart rhythm) -procarbazine -rasagiline -supplements like St. John's wort, kava kava, valerian -tolbutamide -tramadol -tryptophan This list may not describe all possible interactions. Give your health care provider a list of all the medicines, herbs, non-prescription drugs, or dietary supplements you use. Also tell them if you smoke, drink alcohol, or use illegal drugs. Some items may interact with your medicine. What should I watch for while using this medicine? Tell your doctor if your symptoms do not get better or if they get worse. Visit your  doctor or health care professional for regular checks on your progress. Because it may take several weeks to see the full effects of this medicine, it is important to continue your treatment as prescribed by your doctor. Patients and their families should watch out for new or worsening thoughts of suicide or depression. Also watch out for sudden changes in feelings such as feeling anxious, agitated, panicky, irritable, hostile, aggressive, impulsive, severely restless, overly excited and hyperactive, or not being able to sleep. If this happens, especially at the beginning of treatment or after a change in dose, call your health care professional. Bonita QuinYou may get drowsy or dizzy. Do not drive, use machinery, or do anything that needs mental alertness until you know how this medicine affects you. Do not stand or sit up quickly, especially if you are an older patient. This reduces the risk of dizzy or fainting spells. Alcohol may interfere with the effect of this medicine. Avoid alcoholic drinks. Your mouth may get dry. Chewing sugarless gum or sucking hard candy, and drinking plenty of water may help. Contact your doctor if the problem does not go away or is severe. What side effects may I notice from receiving this medicine? Side effects that you should report to your doctor or health care professional as soon as possible: -allergic reactions like skin rash, itching or hives, swelling of the face, lips, or tongue -black or bloody stools, blood in the urine or vomit -fast, irregular heartbeat -feeling faint or lightheaded, falls -hallucination, loss of contact with reality -seizures -suicidal thoughts or other mood changes -unusual bleeding or bruising -unusually weak or tired -vomiting Side effects that usually do not require medical attention (report to your doctor or health  care professional if they continue or are bothersome): -change in appetite -change in sex drive or  performance -diarrhea -increased sweating -indigestion, nausea -tremors This list may not describe all possible side effects. Call your doctor for medical advice about side effects. You may report side effects to FDA at 1-800-FDA-1088. Where should I keep my medicine? Keep out of the reach of children. Store at room temperature between 15 and 30 degrees C (59 and 86 degrees F). Throw away any unused medicine after the expiration date. NOTE: This sheet is a summary. It may not cover all possible information. If you have questions about this medicine, talk to your doctor, pharmacist, or health care provider.  2015, [employer]. (2012-11-14 12:57:35)

## 2013-11-20 NOTE — Assessment & Plan Note (Signed)
Has not been taking levothyroxine for quite sometime. Last TSH 3.9 04/2013. Will recheck today especially given depressed mood, persistent lower extremity swelling, feeling cold and tired.   -TSH

## 2013-11-20 NOTE — Assessment & Plan Note (Signed)
BP Readings from Last 3 Encounters:  11/20/13 146/92  04/03/13 144/76  10/17/12 160/93   Lab Results  Component Value Date   NA 139 04/03/2013   K 5.0 04/03/2013   CREATININE 0.89 04/03/2013    Assessment: Blood pressure control: mildly elevated Progress toward BP goal:  deteriorated  Plan: Medications:  continue current medications lisinopril and lasix (every other day lasix)

## 2013-11-20 NOTE — Assessment & Plan Note (Signed)
Leading to falls. Generally wheelchair bound, obese, and lower extremity edema.   -social work consult--possible PCS vs. Home health and OT safety assessment -advised to walk with can and walker at all times with supervision

## 2013-11-20 NOTE — Progress Notes (Signed)
Subjective:   Patient ID: Lisa Haynes female   DOB: [birth year] 73 y.o.   MRN: [id number]  HPI: [name] is a 73 y.o. with obesity, HTN, and other PMH as listed below presenting to opc today for regular follow up visit.   Depression: cries often, feels low energy, sad, decreased interest, guilty at times, trouble concentrating, increased appetite, and trouble falling asleep lately. She denies suicidal or homicidal ideation, however, does say that she has thought about in the past that it would be easier if she died. She says she has a lot going on with a lot on her mind but she does not wish to talk about it and refuses to meet with psychiatry. She was tearful during our interview and feels anxious, especially around a lot of people. She is frustrated by not moving around as much and frequent falls. She unfortunately lost her 3 brothers form 2012-2014 and her husband in 2011 and feels alone. She lives with her son who works often and gets lonely at home. She is interested in possible PCS and/or home health needs. She remembers being on depression medication in the past but does not recall the name.   HTN and lower extremity edema--complaint with medications. Taking lasix every other day due to frequency of urination and possible increase in falls due to daily lasix. Denies orthopnea or SOB. Has been sleeping with 2 pillows for several years.   Frequent falls--mostly mechanical, although did describe a syncopal episode several months ago that she does not remember clearly. Trips often, does not always use her cane or walker, falls in bathroom at times. Last time she hit her head was in 2014 with neg CT head. Occasionally has headaches that spontaneously resolves or sometimes she takes aleve. She also endorses occasional increase in watery eyes since a fall several years ago but says it is getting better. Denies blurry vision or eye pain. Does wish to see an ophthalmologist in the future and  needs new glasses but cannot afford it at this time.   Past Medical History  Diagnosis Date  . Bilateral knee pain   . Depression   . Hypertension   . Low back pain   . Osteoarthritis   . Osteoporosis   . Hypothyroidism   . Pneumonia     hx of PNA 2013  . Incontinence    Current Outpatient Prescriptions  Medication Sig Dispense Refill  . acetaminophen (TYLENOL) 325 MG tablet Take 2 tablets (650 mg total) by mouth every 6 (six) hours as needed for pain.  60 tablet  3  . Calcium Carbonate-Vitamin D (CALCARB 600/D) 600-400 MG-UNIT per tablet Take 1 tablet by mouth 3 (three) times daily with meals.  30 tablet  2  . Elastic Bandages & Supports (LUMBAR BACK BRACE/SUPPORT PAD) MISC 1 each by Does not apply route once.  1 each  0  . furosemide (LASIX) 20 MG tablet TAKE 1 TABLET BY MOUTH DAILY  30 tablet  5  . levothyroxine (SYNTHROID, LEVOTHROID) 25 MCG tablet Take 1 tablet (25 mcg total) by mouth daily.  30 tablet  6  . lisinopril (PRINIVIL,ZESTRIL) 10 MG tablet TAKE 1 TABLET BY MOUTH EVERY DAY  90 tablet  3  . omeprazole (PRILOSEC) 40 MG capsule TAKE 1 CAPSULE BY MOUTH DAILY  30 capsule  11  . simvastatin (ZOCOR) 10 MG tablet TAKE 1 TABLET BY MOUTH AT BEDTIME  90 tablet  5  . traMADol (ULTRAM) 50 MG tablet Take  1 tablet (50 mg total) by mouth daily as needed.  30 tablet  3  . zoster vaccine live, PF, (ZOSTAVAX) 16109 UNT/0.65ML injection Inject 19,400 Units into the skin once.  1 each  0   No current facility-administered medications for this visit.   No family history on file. History   Social History  . Marital Status: Legally Separated    Spouse Name: N/A    Number of Children: N/A  . Years of Education: N/A   Occupational History  . Retired    Social History Main Topics  . Smoking status: Never Smoker   . Smokeless tobacco: Never Used  . Alcohol Use: No  . Drug Use: No  . Sexual Activity: None   Other Topics Concern  . None   Social History Narrative   Retired:  Motel work   Divorced    Regular exercise- no   Lives alone    Review of Systems:  Constitutional:  Cries often  HEENT:  Denies congestion. Occasional watery eyes  Respiratory:  Denies SOB  Cardiovascular:  Denies chest pain  Gastrointestinal:  Denies nausea, vomiting, abdominal pain  Genitourinary:  incontinence  Musculoskeletal:  Wheelchair, occasionally uses walker and cane, frequent falls  Skin:  Denies pallor, rash and wound.   Neurological:  Occasional headaches.    Objective:  Physical Exam: Filed Vitals:   11/20/13 1535  BP: 146/92  Pulse: 89  Temp: 97.8 F (36.6 C)  TempSrc: Oral  Weight: 236 lb 8 oz (107.276 kg)  SpO2: 95%   Vitals reviewed. General: sitting in wheelchair, NAD HEENT: EOMI, wears glasses, erythematous skin patches at site of nose pieces on her glasses Cardiac: RRR Pulm: clear to auscultation bilaterally Abd: soft, obese, nontender, BS present Ext: warm and well perfused, +1 b/l pitting edema, tenderness to palpation but symmetric, slightly darkened skin near ankles, varicose veins present Neuro: alert and oriented X3  Assessment & Plan:  Discussed with Dr. Criselda Peaches Social work consult--PCS vs. HH Started zoloft

## 2013-11-20 NOTE — Assessment & Plan Note (Signed)
Stable but weight up today from prior visit. Unable to tolerate po lasix qd due to frequency of urination and possible falls when trying to get to bathroom often.   -continue every other day lasix for now

## 2013-11-20 NOTE — Assessment & Plan Note (Addendum)
Denies suicidal or homicidal ideation, but tearful and says things would be easier if she did pass.   +trouble falling asleep, low energy, guilty, decreased interest and energy, trouble concentrating at times, and increased appetite and eating when she gets sad. Feels lonely at homes sometimes, especially after death of her brothers and husband. Lives with son who works a lot. Also gets anxious in crowded places. Cannot go to grocery store anymore.   -start zoloft (should help with anxiety as well) 25mg  po qd then titrate up to 50mg  qd -check TSH

## 2013-11-20 NOTE — Assessment & Plan Note (Signed)
Stool cards given again.   -refuses colonoscopy and mammogram again today. Understands they are important for cancer screening but still refusing. Does claim to have had a colonoscopy in the past but it gave her diarrhea afterwards.

## 2013-11-21 LAB — TSH: TSH: 3.077 u[IU]/mL (ref 0.350–4.500)

## 2013-11-22 NOTE — Progress Notes (Signed)
Case discussed with Dr. Qureshi at the time of the visit.  We reviewed the resident's history and exam and pertinent patient test results.  I agree with the assessment, diagnosis, and plan of care documented in the resident's note. 

## 2013-11-23 ENCOUNTER — Telehealth: Payer: Self-pay | Admitting: Licensed Clinical Social Worker

## 2013-11-23 NOTE — Telephone Encounter (Signed)
Lisa Haynes was referred to CSW for PCS vs Home Health services.  Pt is eligible for both.  CSW attempted to contact [name] to determine exact needs.  Pt's phone line rings busy at this time.

## 2013-11-23 NOTE — Telephone Encounter (Signed)
CSW placed called to pt.  CSW left message requesting return call. CSW provided contact hours and phone number. 

## 2013-11-26 NOTE — Telephone Encounter (Signed)
CSW placed called to pt.  CSW left message requesting return call. CSW provided contact hours and phone number. 

## 2013-11-27 NOTE — Telephone Encounter (Signed)
CSW placed call to Ms. Racz to determine needs in the home.  [name] states she was upset at her daughter-in-law during her [facility].  Pt states dil had left but she has now returned.  CSW informed  Ms. Peake of benefit for PCS assessment.  [name] states at this time dil assist with getting in/out of shower and personal care as needed.  Pt declines HH or PCS at this time. Pt voiced that she gets frustrated and upset frequently, inquired if pt would be agreeable to counseling services. Pt declines stating she did not have transportation.  Pt made aware of Medicaid medical transportation.  [name] declines behavioral health and transportation services at this time.  Pt states at this time she is doing well and does not need any services but will notify [facility] should anything change.  CSW will sign off.

## 2013-11-29 ENCOUNTER — Other Ambulatory Visit (INDEPENDENT_AMBULATORY_CARE_PROVIDER_SITE_OTHER): Payer: Medicare Other

## 2013-11-29 DIAGNOSIS — Z1211 Encounter for screening for malignant neoplasm of colon: Secondary | ICD-10-CM

## 2013-11-29 LAB — POC HEMOCCULT BLD/STL (HOME/3-CARD/SCREEN)
Card #3 Fecal Occult Blood, POC: NEGATIVE
FECAL OCCULT BLD: NEGATIVE
FECAL OCCULT BLD: NEGATIVE

## 2013-12-30 ENCOUNTER — Other Ambulatory Visit: Payer: Self-pay | Admitting: Internal Medicine

## 2013-12-31 ENCOUNTER — Other Ambulatory Visit: Payer: Self-pay | Admitting: Internal Medicine

## 2014-01-01 NOTE — Telephone Encounter (Signed)
I do not want to give a 90 day supply as this medication is being used prn for now. Thanks! Dr q

## 2014-03-10 ENCOUNTER — Other Ambulatory Visit: Payer: Self-pay | Admitting: Internal Medicine

## 2014-03-11 NOTE — Telephone Encounter (Signed)
Is she requesting this? Or is this automated? Last time she was not tolerating lasix very well.

## 2014-03-11 NOTE — Telephone Encounter (Signed)
Not sure what that response is? Is surescripts the pharmacy?

## 2014-03-17 ENCOUNTER — Other Ambulatory Visit: Payer: Self-pay | Admitting: Internal Medicine

## 2014-03-18 ENCOUNTER — Other Ambulatory Visit: Payer: Self-pay | Admitting: Internal Medicine

## 2014-03-18 NOTE — Telephone Encounter (Signed)
zoloft filled. Can we please get her in to see me on one of my upcoming CC's? Thank you,  Dr q

## 2014-03-20 ENCOUNTER — Other Ambulatory Visit: Payer: Self-pay | Admitting: Internal Medicine

## 2014-03-20 NOTE — Telephone Encounter (Signed)
Thank you :)

## 2014-03-20 NOTE — Telephone Encounter (Signed)
Guys where is this coming from?   I asked to see her in clinic as the lasix was to be revaluated. Is she needing it for her swelling? I have refused the past one but this keeps coming back

## 2014-03-20 NOTE — Telephone Encounter (Signed)
I called the patient and she needs Lisinopril not lasix.  The refill for lisinopril is ready at the pharmacy and pt is aware.  She has plenty of lasix at home and only takes one every other day.  Swelling in her legs has improved.   I also called pharmacy and asked them to STOP requesting lasix.

## 2014-04-09 NOTE — Telephone Encounter (Signed)
Rx filled 03/18/14.

## 2014-04-19 ENCOUNTER — Other Ambulatory Visit: Payer: Self-pay | Admitting: Internal Medicine

## 2014-06-20 ENCOUNTER — Inpatient Hospital Stay (HOSPITAL_COMMUNITY)
Admission: EM | Admit: 2014-06-20 | Discharge: 2014-06-22 | DRG: 439 | Disposition: A | Discharge status: Home / Self Care | Payer: Medicare Other | Attending: Internal Medicine | Admitting: Internal Medicine

## 2014-06-20 ENCOUNTER — Observation Stay (HOSPITAL_COMMUNITY): Payer: Medicare Other

## 2014-06-20 ENCOUNTER — Telehealth: Payer: Self-pay | Admitting: *Deleted

## 2014-06-20 ENCOUNTER — Emergency Department (HOSPITAL_COMMUNITY): Payer: Medicare Other

## 2014-06-20 ENCOUNTER — Encounter (HOSPITAL_COMMUNITY): Payer: Self-pay

## 2014-06-20 DIAGNOSIS — K5790 Diverticulosis of intestine, part unspecified, without perforation or abscess without bleeding: Secondary | ICD-10-CM | POA: Diagnosis present

## 2014-06-20 DIAGNOSIS — Z90710 Acquired absence of both cervix and uterus: Secondary | ICD-10-CM

## 2014-06-20 DIAGNOSIS — M810 Age-related osteoporosis without current pathological fracture: Secondary | ICD-10-CM | POA: Diagnosis present

## 2014-06-20 DIAGNOSIS — Z6841 Body Mass Index (BMI) 40.0 and over, adult: Secondary | ICD-10-CM

## 2014-06-20 DIAGNOSIS — Z7982 Long term (current) use of aspirin: Secondary | ICD-10-CM

## 2014-06-20 DIAGNOSIS — I4891 Unspecified atrial fibrillation: Secondary | ICD-10-CM

## 2014-06-20 DIAGNOSIS — F418 Other specified anxiety disorders: Secondary | ICD-10-CM | POA: Diagnosis present

## 2014-06-20 DIAGNOSIS — K521 Toxic gastroenteritis and colitis: Secondary | ICD-10-CM | POA: Diagnosis present

## 2014-06-20 DIAGNOSIS — R740 Nonspecific elevation of levels of transaminase and lactic acid dehydrogenase [LDH]: Secondary | ICD-10-CM

## 2014-06-20 DIAGNOSIS — N3000 Acute cystitis without hematuria: Secondary | ICD-10-CM

## 2014-06-20 DIAGNOSIS — M170 Bilateral primary osteoarthritis of knee: Secondary | ICD-10-CM | POA: Diagnosis not present

## 2014-06-20 DIAGNOSIS — E669 Obesity, unspecified: Secondary | ICD-10-CM

## 2014-06-20 DIAGNOSIS — K8020 Calculus of gallbladder without cholecystitis without obstruction: Secondary | ICD-10-CM | POA: Diagnosis not present

## 2014-06-20 DIAGNOSIS — E785 Hyperlipidemia, unspecified: Secondary | ICD-10-CM | POA: Diagnosis present

## 2014-06-20 DIAGNOSIS — E876 Hypokalemia: Secondary | ICD-10-CM | POA: Diagnosis present

## 2014-06-20 DIAGNOSIS — R10814 Left lower quadrant abdominal tenderness: Secondary | ICD-10-CM | POA: Diagnosis not present

## 2014-06-20 DIAGNOSIS — K5730 Diverticulosis of large intestine without perforation or abscess without bleeding: Secondary | ICD-10-CM | POA: Diagnosis not present

## 2014-06-20 DIAGNOSIS — N390 Urinary tract infection, site not specified: Secondary | ICD-10-CM

## 2014-06-20 DIAGNOSIS — R1012 Left upper quadrant pain: Secondary | ICD-10-CM | POA: Diagnosis not present

## 2014-06-20 DIAGNOSIS — R7401 Elevation of levels of liver transaminase levels: Secondary | ICD-10-CM | POA: Diagnosis present

## 2014-06-20 DIAGNOSIS — R600 Localized edema: Secondary | ICD-10-CM | POA: Diagnosis present

## 2014-06-20 DIAGNOSIS — R109 Unspecified abdominal pain: Secondary | ICD-10-CM

## 2014-06-20 DIAGNOSIS — T3695XA Adverse effect of unspecified systemic antibiotic, initial encounter: Secondary | ICD-10-CM | POA: Diagnosis present

## 2014-06-20 DIAGNOSIS — I10 Essential (primary) hypertension: Secondary | ICD-10-CM | POA: Diagnosis present

## 2014-06-20 DIAGNOSIS — K859 Acute pancreatitis without necrosis or infection, unspecified: Secondary | ICD-10-CM | POA: Diagnosis present

## 2014-06-20 DIAGNOSIS — Z9049 Acquired absence of other specified parts of digestive tract: Secondary | ICD-10-CM | POA: Diagnosis present

## 2014-06-20 DIAGNOSIS — I480 Paroxysmal atrial fibrillation: Secondary | ICD-10-CM | POA: Diagnosis present

## 2014-06-20 DIAGNOSIS — K851 Biliary acute pancreatitis: Principal | ICD-10-CM | POA: Diagnosis present

## 2014-06-20 DIAGNOSIS — K219 Gastro-esophageal reflux disease without esophagitis: Secondary | ICD-10-CM | POA: Diagnosis present

## 2014-06-20 DIAGNOSIS — R112 Nausea with vomiting, unspecified: Secondary | ICD-10-CM | POA: Diagnosis not present

## 2014-06-20 DIAGNOSIS — R159 Full incontinence of feces: Secondary | ICD-10-CM | POA: Diagnosis present

## 2014-06-20 DIAGNOSIS — Z8701 Personal history of pneumonia (recurrent): Secondary | ICD-10-CM

## 2014-06-20 DIAGNOSIS — E039 Hypothyroidism, unspecified: Secondary | ICD-10-CM | POA: Diagnosis present

## 2014-06-20 DIAGNOSIS — K2970 Gastritis, unspecified, without bleeding: Secondary | ICD-10-CM | POA: Diagnosis not present

## 2014-06-20 LAB — ETHANOL

## 2014-06-20 LAB — URINALYSIS, ROUTINE W REFLEX MICROSCOPIC
GLUCOSE, UA: NEGATIVE mg/dL
Ketones, ur: 40 mg/dL — AB
Nitrite: POSITIVE — AB
PH: 5.5 (ref 5.0–8.0)
Protein, ur: 30 mg/dL — AB
Specific Gravity, Urine: 1.025 (ref 1.005–1.030)
Urobilinogen, UA: 1 mg/dL (ref 0.0–1.0)

## 2014-06-20 LAB — URINE MICROSCOPIC-ADD ON

## 2014-06-20 LAB — CBC WITH DIFFERENTIAL/PLATELET
Basophils Absolute: 0 10*3/uL (ref 0.0–0.1)
Basophils Relative: 0 % (ref 0–1)
Eosinophils Absolute: 0.1 10*3/uL (ref 0.0–0.7)
Eosinophils Relative: 0 % (ref 0–5)
HCT: 42.5 % (ref 36.0–46.0)
HEMOGLOBIN: 13.8 g/dL (ref 12.0–15.0)
LYMPHS ABS: 1.8 10*3/uL (ref 0.7–4.0)
LYMPHS PCT: 11 % — AB (ref 12–46)
MCH: 29.2 pg (ref 26.0–34.0)
MCHC: 32.5 g/dL (ref 30.0–36.0)
MCV: 90 fL (ref 78.0–100.0)
MONOS PCT: 12 % (ref 3–12)
Monocytes Absolute: 1.9 10*3/uL — ABNORMAL HIGH (ref 0.1–1.0)
NEUTROS PCT: 77 % (ref 43–77)
Neutro Abs: 12.2 10*3/uL — ABNORMAL HIGH (ref 1.7–7.7)
PLATELETS: 211 10*3/uL (ref 150–400)
RBC: 4.72 MIL/uL (ref 3.87–5.11)
RDW: 14.3 % (ref 11.5–15.5)
WBC: 16 10*3/uL — ABNORMAL HIGH (ref 4.0–10.5)

## 2014-06-20 LAB — PROTIME-INR
INR: 1.18 (ref 0.00–1.49)
PROTHROMBIN TIME: 15.1 s (ref 11.6–15.2)

## 2014-06-20 LAB — COMPREHENSIVE METABOLIC PANEL
ALBUMIN: 3.1 g/dL — AB (ref 3.5–5.2)
ALK PHOS: 97 U/L (ref 39–117)
ALT: 42 U/L — ABNORMAL HIGH (ref 0–35)
AST: 21 U/L (ref 0–37)
Anion gap: 10 (ref 5–15)
BILIRUBIN TOTAL: 1.7 mg/dL — AB (ref 0.3–1.2)
BUN: 22 mg/dL (ref 6–23)
CO2: 21 mmol/L (ref 19–32)
Calcium: 9 mg/dL (ref 8.4–10.5)
Chloride: 103 mmol/L (ref 96–112)
Creatinine, Ser: 0.99 mg/dL (ref 0.50–1.10)
GFR calc non Af Amer: 54 mL/min — ABNORMAL LOW (ref >90)
GFR, EST AFRICAN AMERICAN: 63 mL/min — AB (ref >90)
GLUCOSE: 93 mg/dL (ref 70–99)
POTASSIUM: 3.8 mmol/L (ref 3.5–5.1)
Sodium: 134 mmol/L — ABNORMAL LOW (ref 135–145)
Total Protein: 7 g/dL (ref 6.0–8.3)

## 2014-06-20 LAB — I-STAT CG4 LACTIC ACID, ED: LACTIC ACID, VENOUS: 1.58 mmol/L (ref 0.5–2.0)

## 2014-06-20 LAB — LIPASE, BLOOD: LIPASE: 60 U/L — AB (ref 11–59)

## 2014-06-20 LAB — CK: Total CK: 85 U/L (ref 7–177)

## 2014-06-20 LAB — TSH: TSH: 3.253 u[IU]/mL (ref 0.350–4.500)

## 2014-06-20 LAB — BILIRUBIN, FRACTIONATED(TOT/DIR/INDIR)
BILIRUBIN DIRECT: 0.3 mg/dL (ref 0.0–0.5)
Indirect Bilirubin: 0.8 mg/dL (ref 0.3–0.9)
Total Bilirubin: 1.1 mg/dL (ref 0.3–1.2)

## 2014-06-20 LAB — I-STAT TROPONIN, ED: TROPONIN I, POC: 0.04 ng/mL (ref 0.00–0.08)

## 2014-06-20 LAB — MAGNESIUM: MAGNESIUM: 2 mg/dL (ref 1.5–2.5)

## 2014-06-20 LAB — BRAIN NATRIURETIC PEPTIDE: B Natriuretic Peptide: 350.1 pg/mL — ABNORMAL HIGH (ref 0.0–100.0)

## 2014-06-20 LAB — PHOSPHORUS: PHOSPHORUS: 3 mg/dL (ref 2.3–4.6)

## 2014-06-20 LAB — APTT: aPTT: 37 seconds (ref 24–37)

## 2014-06-20 MED ORDER — IOHEXOL 300 MG/ML  SOLN
25.0000 mL | Freq: Once | INTRAMUSCULAR | Status: AC | PRN
  Administered 2014-06-20: 25 mL via ORAL

## 2014-06-20 MED ORDER — DILTIAZEM HCL 25 MG/5ML IV SOLN: 10.0000 mg | Freq: Once | INTRAVENOUS | Status: DC

## 2014-06-20 MED ORDER — FENTANYL CITRATE 0.05 MG/ML IJ SOLN: 100.0000 ug | INTRAMUSCULAR | Status: DC | PRN

## 2014-06-20 MED ORDER — ONDANSETRON HCL 4 MG/2ML IJ SOLN: 4.0000 mg | Freq: Four times a day (QID) | INTRAMUSCULAR | Status: DC | PRN

## 2014-06-20 MED ORDER — SODIUM CHLORIDE 0.9 % IV BOLUS (SEPSIS)
1000.0000 mL | Freq: Once | INTRAVENOUS | Status: AC
  Administered 2014-06-20: 1000 mL via INTRAVENOUS

## 2014-06-20 MED ORDER — LACTATED RINGERS IV SOLN
INTRAVENOUS | Status: DC
  Administered 2014-06-20 – 2014-06-21 (×2): via INTRAVENOUS

## 2014-06-20 MED ORDER — ENOXAPARIN SODIUM 40 MG/0.4ML ~~LOC~~ SOLN
40.0000 mg | SUBCUTANEOUS | Status: DC
  Administered 2014-06-20 – 2014-06-21 (×2): 40 mg via SUBCUTANEOUS
  Filled 2014-06-20 (×4): qty 0.4

## 2014-06-20 MED ORDER — PANTOPRAZOLE SODIUM 40 MG IV SOLR
40.0000 mg | INTRAVENOUS | Status: DC
  Administered 2014-06-20: 40 mg via INTRAVENOUS
  Filled 2014-06-20 (×3): qty 40

## 2014-06-20 MED ORDER — CEFTRIAXONE SODIUM IN DEXTROSE 20 MG/ML IV SOLN
1.0000 g | INTRAVENOUS | Status: DC
  Administered 2014-06-21: 1 g via INTRAVENOUS
  Filled 2014-06-20 (×2): qty 50

## 2014-06-20 MED ORDER — DILTIAZEM HCL 100 MG IV SOLR: 5.0000 mg/h | INTRAVENOUS | Status: DC

## 2014-06-20 MED ORDER — CEFTRIAXONE SODIUM 1 G IJ SOLR
1.0000 g | Freq: Once | INTRAMUSCULAR | Status: AC
  Administered 2014-06-20: 1 g via INTRAVENOUS
  Filled 2014-06-20: qty 10

## 2014-06-20 MED ORDER — ONDANSETRON HCL 4 MG/2ML IJ SOLN: 4.0000 mg | Freq: Three times a day (TID) | INTRAMUSCULAR | Status: DC | PRN

## 2014-06-20 MED ORDER — INFLUENZA VAC SPLIT QUAD 0.5 ML IM SUSY
0.5000 mL | PREFILLED_SYRINGE | INTRAMUSCULAR | Status: DC
  Filled 2014-06-20: qty 0.5

## 2014-06-20 MED ORDER — FENTANYL CITRATE 0.05 MG/ML IJ SOLN
100.0000 ug | Freq: Once | INTRAMUSCULAR | Status: AC
  Administered 2014-06-20: 100 ug via INTRAVENOUS
  Filled 2014-06-20: qty 2

## 2014-06-20 MED ORDER — MORPHINE SULFATE 2 MG/ML IJ SOLN: 1.0000 mg | INTRAMUSCULAR | Status: DC | PRN

## 2014-06-20 MED ORDER — IOHEXOL 300 MG/ML  SOLN
100.0000 mL | Freq: Once | INTRAMUSCULAR | Status: AC | PRN
  Administered 2014-06-20: 100 mL via INTRAVENOUS

## 2014-06-20 MED ORDER — DEXTROSE 5 % IV SOLN
5.0000 mg/h | Freq: Once | INTRAVENOUS | Status: AC
  Administered 2014-06-20: 5 mg/h via INTRAVENOUS

## 2014-06-20 MED ORDER — SODIUM CHLORIDE 0.9 % IJ SOLN
3.0000 mL | Freq: Two times a day (BID) | INTRAMUSCULAR | Status: DC
  Administered 2014-06-20 – 2014-06-21 (×3): 3 mL via INTRAVENOUS

## 2014-06-20 MED ORDER — DILTIAZEM LOAD VIA INFUSION
10.0000 mg | Freq: Once | INTRAVENOUS | Status: AC
  Administered 2014-06-20: 10 mg via INTRAVENOUS
  Filled 2014-06-20: qty 10

## 2014-06-20 NOTE — ED Provider Notes (Signed)
CSN: [id number]     Arrival date & time 06/20/14  1109 History   First MD Initiated Contact with Patient 06/20/14 1113     Chief Complaint  Patient presents with  . Atrial Fibrillation  . Abdominal Pain     (Consider location/radiation/quality/duration/timing/severity/associated sxs/prior Treatment) HPI Comments: Patient with history of hypothyroidism -- presents with 3 day history of upper abd pain, N/V. She was found to be in atrial fibrillation with RVR by EMS. This is apparently new-onset per patient and records. She is not anticoagulated. She has had some lightheadedness with standing but not with sitting or lying. No syncope or shortness of breath/cough. She c/o mild chest discomfort today. Patient had multiple episodes of vomiting 3 days ago, less frequent over the past 2 days. Patient has a history of an abdominal hysterectomy. No urinary symptoms. EMS administered 10 mg of Cardizem prior to arrival without results. The onset of this condition was acute. The course is constant. Aggravating factors: none. Alleviating factors: none.    The history is provided by the patient and medical records.    Past Medical History  Diagnosis Date  . Bilateral knee pain   . Depression   . Hypertension   . Low back pain   . Osteoarthritis   . Osteoporosis   . Hypothyroidism   . Pneumonia     hx of PNA 2013  . Incontinence    Past Surgical History  Procedure Laterality Date  . Abdominal hysterectomy      with unilateral oophorectomy   History reviewed. No pertinent family history. History  Substance Use Topics  . Smoking status: Never Smoker   . Smokeless tobacco: Never Used  . Alcohol Use: No   OB History    No data available     Review of Systems  Constitutional: Negative for fever and diaphoresis.  HENT: Negative for rhinorrhea and sore throat.   Eyes: Negative for redness.  Respiratory: Negative for cough and shortness of breath.   Cardiovascular: Positive for chest pain.  Negative for palpitations and leg swelling.  Gastrointestinal: Positive for nausea, vomiting, abdominal pain and constipation. Negative for diarrhea.  Genitourinary: Negative for dysuria.  Musculoskeletal: Negative for myalgias, back pain and neck pain.  Skin: Negative for rash.  Neurological: Positive for light-headedness (standing only). Negative for syncope and headaches.  Psychiatric/Behavioral: The patient is not nervous/anxious.       Allergies  Review of patient's allergies indicates no known allergies.  Home Medications   Prior to Admission medications   Medication Sig Start Date End Date Taking? Authorizing Provider  acetaminophen (TYLENOL) 325 MG tablet Take 2 tablets (650 mg total) by mouth every 6 (six) hours as needed for pain. 06/29/12   Dow Adolphichard Kazibwe, MD  Calcium Carbonate-Vitamin D (CALCARB 600/D) 600-400 MG-UNIT per tablet Take 1 tablet by mouth 3 (three) times daily with meals. 06/29/12   Dow Adolphichard Kazibwe, MD  Elastic Bandages & Supports (LUMBAR BACK BRACE/SUPPORT PAD) MISC 1 each by Does not apply route once. 10/17/12   Baltazar ApoSamaya J Qureshi, MD  furosemide (LASIX) 20 MG tablet Take 1 tablet by mouth every other day 12/31/13   Baltazar ApoSamaya J Qureshi, MD  levothyroxine (SYNTHROID, LEVOTHROID) 25 MCG tablet Take 1 tablet (25 mcg total) by mouth daily. 01/26/12   Genelle GatherKathryn F Glenn, MD  lisinopril (PRINIVIL,ZESTRIL) 10 MG tablet TAKE 1 TABLET BY MOUTH EVERY DAY 08/14/13   Baltazar ApoSamaya J Qureshi, MD  omeprazole (PRILOSEC) 40 MG capsule TAKE 1 CAPSULE BY MOUTH DAILY 07/03/13  Baltazar Apo, MD  sertraline (ZOLOFT) 25 MG tablet TAKE 1 TABLET BY MOUTH EVERY DAY 04/19/14   Baltazar Apo, MD  simvastatin (ZOCOR) 10 MG tablet TAKE 1 TABLET BY MOUTH AT BEDTIME 10/02/13   Baltazar Apo, MD  traMADol (ULTRAM) 50 MG tablet Take 1 tablet (50 mg total) by mouth daily as needed. 10/17/12   Baltazar Apo, MD  zoster vaccine live, PF, (ZOSTAVAX) 16109 UNT/0.65ML injection Inject 19,400 Units into the skin  once. 06/29/12   Dow Adolph, MD   BP 144/128 mmHg  Pulse 56  Temp(Src) 98.6 F (37 C) (Oral)  SpO2 98% Physical Exam  Constitutional: She appears well-developed and well-nourished.  HENT:  Head: Normocephalic and atraumatic.  Mouth/Throat: Oropharynx is clear and moist.  Eyes: Conjunctivae are normal. Right eye exhibits no discharge. Left eye exhibits no discharge.  Neck: Normal range of motion. Neck supple.  Cardiovascular: Normal heart sounds.  An irregularly irregular rhythm present. Tachycardia present.   No murmur heard. Pulmonary/Chest: Effort normal and breath sounds normal. No respiratory distress. She has no wheezes. She has no rales.  Abdominal: Soft. There is tenderness in the right upper quadrant, epigastric area and left upper quadrant. There is no rigidity, no rebound, no guarding, no CVA tenderness, no tenderness at McBurney's point and negative Murphy's sign.  Obese habitus.  Neurological: She is alert.  Skin: Skin is warm and dry.  Psychiatric: She has a normal mood and affect.  Nursing note and vitals reviewed.   ED Course  Procedures (including critical care time) Labs Review Labs Reviewed  CBC WITH DIFFERENTIAL/PLATELET - Abnormal; Notable for the following:    WBC 16.0 (*)    Neutro Abs 12.2 (*)    Lymphocytes Relative 11 (*)    Monocytes Absolute 1.9 (*)    All other components within normal limits  URINALYSIS, ROUTINE W REFLEX MICROSCOPIC - Abnormal; Notable for the following:    Color, Urine AMBER (*)    APPearance CLOUDY (*)    Hgb urine dipstick MODERATE (*)    Bilirubin Urine SMALL (*)    Ketones, ur 40 (*)    Protein, ur 30 (*)    Nitrite POSITIVE (*)    Leukocytes, UA SMALL (*)    All other components within normal limits  COMPREHENSIVE METABOLIC PANEL - Abnormal; Notable for the following:    Sodium 134 (*)    Albumin 3.1 (*)    ALT 42 (*)    Total Bilirubin 1.7 (*)    GFR calc non Af Amer 54 (*)    GFR calc Af Amer 63 (*)    All  other components within normal limits  LIPASE, BLOOD - Abnormal; Notable for the following:    Lipase 60 (*)    All other components within normal limits  URINE MICROSCOPIC-ADD ON - Abnormal; Notable for the following:    Bacteria, UA MANY (*)    All other components within normal limits  URINE CULTURE  I-STAT CG4 LACTIC ACID, ED  Rosezena Sensor, ED    Imaging Review Dg Chest 2 View  06/20/2014   CLINICAL DATA:  Atrial fibrillation  EXAM: CHEST  2 VIEW  COMPARISON:  05/31/2012  FINDINGS: Normal heart size and mediastinal contours. Chronic pulmonary hyperinflation. No acute infiltrate or edema. No effusion or pneumothorax. No acute osseous findings.  IMPRESSION: 1. No change to suggest acute cardiopulmonary disease. 2. Chronic hyperinflation.   Electronically Signed   By: Marnee Spring M.D.   On:  06/20/2014 15:40   Ct Abdomen Pelvis W Contrast  06/20/2014   CLINICAL DATA:  Periumbilical abdominal pain.  EXAM: CT ABDOMEN AND PELVIS WITH CONTRAST  TECHNIQUE: Multidetector CT imaging of the abdomen and pelvis was performed using the standard protocol following bolus administration of intravenous contrast.  CONTRAST:  OMNIPAQUE IOHEXOL 300 MG/ML  SOLN  COMPARISON:  04/28/2011  FINDINGS: BODY WALL: Marked pelvic floor laxity  LOWER CHEST: Unremarkable.  ABDOMEN/PELVIS:  Liver: Subcapsular cyst in the left liver measuring 16 mm.  Biliary: Cholelithiasis without evidence of acute cholecystitis or biliary obstruction.  Pancreas: Expansion of the tail and neighboring body with edema in the retroperitoneal upper abdominal ligaments. There is no necrosis or focal fluid collection. No acute vascular findings.  Spleen: Unremarkable.  Adrenals: Unremarkable.  Kidneys and ureters: No hydronephrosis or asymmetric renal enhancement. Study of limited utility for detecting stone given excretory phase imaging.  Bladder: Limited evaluation given collapsed state.  Reproductive: Hysterectomy and oophorectomies   Bowel: Colonic diverticulosis, especially in the sigmoid colon. The appendix is distended, but completely fills with oral contrast and is not thick walled.  Retroperitoneum: No mass or adenopathy.  Peritoneum: No ascites or pneumoperitoneum.  Vascular: No acute abnormality.  OSSEOUS: Remote T9 compression fracture with moderate height loss.  IMPRESSION: 1. Acute pancreatitis without fluid collection or necrosis. 2. Cholelithiasis. 3. Colonic diverticulosis.   Electronically Signed   By: Marnee Spring M.D.   On: 06/20/2014 15:03     EKG Interpretation None       11:26 AM Patient seen and examined. Work-up initiated. Medications ordered. Discussed with Dr. Juleen China. EMS gave  cardizem PTA without response.   Vital signs reviewed and are as follows: BP 144/128 mmHg  Pulse 56  Temp(Src) 98.6 F (37 C) (Oral)  SpO2 98%  Rate is 140-160.   12:05 PM Patient converted to NSR. BP has normalized.    Date: 06/20/2014  Rate: 89  Rhythm: normal sinus rhythm  QRS Axis: normal  Intervals: normal  ST/T Wave abnormalities: normal  Conduction Disutrbances:none  Narrative Interpretation:   Old EKG Reviewed: changed, resolution of rapid afib  4:01 PM CT shows acute pancreatitis. Patient is comfortable.   Pancreatitis: ice chips, pain control.  A fib: currently maintaining NSR, monitor, CHADS-VASC=4 UTI: Urine culture, IV rocephin  Spoke with Dr. Johna Roles of [facility] who will see and admit.   CRITICAL CARE Performed by: Carolee Rota Total critical care time: 40 Critical care time was exclusive of separately billable procedures and treating other patients. Critical care was necessary to treat or prevent imminent or life-threatening deterioration. Critical care was time spent personally by me on the following activities: development of treatment plan with patient and/or surrogate as well as nursing, discussions with consultants, evaluation of patient's response to treatment, examination of  patient, obtaining history from patient or surrogate, ordering and performing treatments and interventions, ordering and review of laboratory studies, ordering and review of radiographic studies, pulse oximetry and re-evaluation of patient's condition.    MDM   Final diagnoses:  Abdominal pain  Acute pancreatitis, unspecified pancreatitis type  Atrial fibrillation with RVR  Acute cystitis without hematuria   Admit.     Renne Crigler, PA-C 06/20/14 1605  Raeford Razor, MD 06/20/14 [phone]

## 2014-06-20 NOTE — Telephone Encounter (Signed)
Call was transferred from the front desk, Pt is tearful, she states she has been sick since Saturday after eating a hamburg. She c/o nausea, vomiting, and abd pain and diarrhea.  She has not eaten anything but a scrambled egg  In the past 4 days.  She does not want to eat but is able to drink fluids. She is also c/o dizziness and states she fell yesterday in the Bathroom.  No injury noted as she caught herself with the towel rack.  Usually she is up and ambulates around house.  She is alone at the time and not able to get to the clinic.  I advised 911 and ED for evaluation.  She agrees and will call now.

## 2014-06-20 NOTE — Telephone Encounter (Signed)
Agree with ED evaluation. 

## 2014-06-20 NOTE — H&P (Signed)
Date: 06/20/2014               Patient Name:  Lisa Haynes MRN: [id number]  DOB: 06/07/1940 Age / Sex: 74 y.o., female   PCP: Baltazar Apo, MD         Medical Service: Internal Medicine Teaching Service         Attending Physician: Dr. Burns Spain, MD    First Contact: Dr. Mitzie Na Moding  Pager: [phone]  Second Contact: Dr. Otis Brace Pager: [phone]       After Hours (After 5p/  First Contact Pager: [phone]  weekends / holidays): Second Contact Pager: [phone]   Chief Complaint: Abdominal pain, nausea/vomiting   History of Present Illness: Lisa Haynes is a 74yo woman with PMHx of HTN, hypothyroidism, and osteoarthritis who presents to the ED with nausea, vomiting, and abdominal pain. Patient reports about 1 week ago she had "food poisoning" with nausea and vomiting. She states she started to have abdominal pain about 2 days later. She describes her abdominal pain as 10/10 in severity, mostly left-sided, radiating to her back, and improves when she leans forward. She states she continued to have nausea and vomiting over the past week and has been unable to eat anything for the past 4 days. She denies any fever or chills, chest pain, diarrhea, dark or bloody stools, or difficulty swallowing. Patient denies alcohol use.   While in the ED, the patient was found to be in new onset AFib with RVR with HR in 170-180s. She was started on Cardizem and then converted back to NSR. Patient reports she has been told of having a "slow heart rate" in the past, but denies any arrhythmias including AFib. Her lipase was found to be 60. CT Abd/Pelvis showed evidence of acute pancreatitis without fluid collection or necrosis as well as cholelithiasis. Additionally, her UA was noted to have many bacteria, 3-6 WBCs, +nitrites, and small leukocytes. Patient reports frequency, but denies dysuria, hematuria, and flank pain.    Outpatient Meds: Tylenol 650 mg Q6H PRN Calcium Carbonate-Vitamin D  600-400 mg-unit TID with meals Lasix 20 mg every other day Levothyroxine 25 mcg daily Lisinopril 10 mg daily Multivitamin daily Omeprazole 40 mg daily Sertraline 25 mg daily Simvastatin 10 mg QHS Tramadol 50 mg daily PRN  Allergies: Allergies as of 06/20/2014  . (No Known Allergies)   Past Medical History  Diagnosis Date  . Bilateral knee pain   . Depression   . Hypertension   . Low back pain   . Osteoarthritis   . Osteoporosis   . Hypothyroidism   . Pneumonia     hx of PNA 2013  . Incontinence    Past Surgical History  Procedure Laterality Date  . Abdominal hysterectomy      with unilateral oophorectomy   History reviewed. No pertinent family history. History   Social History  . Marital Status: Legally Separated    Spouse Name: N/A  . Number of Children: N/A  . Years of Education: N/A   Occupational History  . Retired    Social History Main Topics  . Smoking status: Never Smoker   . Smokeless tobacco: Never Used  . Alcohol Use: No  . Drug Use: No  . Sexual Activity: Not on file   Other Topics Concern  . Not on file   Social History Narrative   Retired: Motel work   Divorced    Regular exercise- no   Lives alone    Review  of Systems: General: Denies night sweats, changes in weight HEENT: Denies headaches, ear pain, changes in vision, rhinorrhea, sore throat CV: Denies palpitations, SOB, orthopnea Pulm: Denies SOB, cough, wheezing GI: See HPI GU: See HPI Msk: Positive for leg pain, muscle cramps. Denies joint pains Neuro: Denies weakness, numbness, tingling Skin: Denies rashes, bruising  Physical Exam: Blood pressure 101/71, pulse 72, temperature 98.6 F (37 C), temperature source Oral, resp. rate 16, SpO2 99 %. General: alert, sitting up in bed, pleasant, NAD HEENT: [place]/AT, EOMI, PERRL, mucus membranes dry CV: RRR, no m/g/r Pulm: CTA bilaterally, breaths non-labored Abd: BS+, soft, mild tenderness in LUQ and LLQ, non-distended. No CVA  tenderness.  Ext: warm, moves all. Mild tenderness to palpation of bilateral calves. Lower extremities appear symmetric, trace edema, no erythema.  Neuro: alert and oriented x 3, no focal deficits  Lab results: Basic Metabolic Panel:  Recent Labs  16/01/9601/18/16 1140  NA 134*  K 3.8  CL 103  CO2 21  GLUCOSE 93  BUN 22  CREATININE 0.99  CALCIUM 9.0   Liver Function Tests:  Recent Labs  06/20/14 1140  AST 21  ALT 42*  ALKPHOS 97  BILITOT 1.7*  PROT 7.0  ALBUMIN 3.1*    Recent Labs  06/20/14 1140  LIPASE 60*   CBC:  Recent Labs  06/20/14 1140  WBC 16.0*  NEUTROABS 12.2*  HGB 13.8  HCT 42.5  MCV 90.0  PLT 211   Urinalysis:  Recent Labs  06/20/14 1419  COLORURINE AMBER*  LABSPEC 1.025  PHURINE 5.5  GLUCOSEU NEGATIVE  HGBUR MODERATE*  BILIRUBINUR SMALL*  KETONESUR 40*  PROTEINUR 30*  UROBILINOGEN 1.0  NITRITE POSITIVE*  LEUKOCYTESUR SMALL*   Imaging results:  Dg Chest 2 View  06/20/2014   CLINICAL DATA:  Atrial fibrillation  EXAM: CHEST  2 VIEW  COMPARISON:  05/31/2012  FINDINGS: Normal heart size and mediastinal contours. Chronic pulmonary hyperinflation. No acute infiltrate or edema. No effusion or pneumothorax. No acute osseous findings.  IMPRESSION: 1. No change to suggest acute cardiopulmonary disease. 2. Chronic hyperinflation.   Electronically Signed   By: Marnee SpringJonathon  Watts M.D.   On: 06/20/2014 15:40   Ct Abdomen Pelvis W Contrast  06/20/2014   CLINICAL DATA:  Periumbilical abdominal pain.  EXAM: CT ABDOMEN AND PELVIS WITH CONTRAST  TECHNIQUE: Multidetector CT imaging of the abdomen and pelvis was performed using the standard protocol following bolus administration of intravenous contrast.  CONTRAST:  100mL OMNIPAQUE IOHEXOL 300 MG/ML  SOLN  COMPARISON:  04/28/2011  FINDINGS: BODY WALL: Marked pelvic floor laxity  LOWER CHEST: Unremarkable.  ABDOMEN/PELVIS:  Liver: Subcapsular cyst in the left liver measuring 16 mm.  Biliary: Cholelithiasis without  evidence of acute cholecystitis or biliary obstruction.  Pancreas: Expansion of the tail and neighboring body with edema in the retroperitoneal upper abdominal ligaments. There is no necrosis or focal fluid collection. No acute vascular findings.  Spleen: Unremarkable.  Adrenals: Unremarkable.  Kidneys and ureters: No hydronephrosis or asymmetric renal enhancement. Study of limited utility for detecting stone given excretory phase imaging.  Bladder: Limited evaluation given collapsed state.  Reproductive: Hysterectomy and oophorectomies  Bowel: Colonic diverticulosis, especially in the sigmoid colon. The appendix is distended, but completely fills with oral contrast and is not thick walled.  Retroperitoneum: No mass or adenopathy.  Peritoneum: No ascites or pneumoperitoneum.  Vascular: No acute abnormality.  OSSEOUS: Remote T9 compression fracture with moderate height loss.  IMPRESSION: 1. Acute pancreatitis without fluid collection or necrosis. 2. Cholelithiasis.  3. Colonic diverticulosis.   Electronically Signed   By: Marnee Spring M.D.   On: 06/20/2014 15:03    Other results: EKG: AFib with RVR, HR 169.   Assessment & Plan by Problem:  Acute Pancreatitis: Patient presented with nausea, vomiting, and abdominal pain over the last week. Lipase elevated at 60 and WBC elevated at 16. Diagnosis confirmed on CT Abd. CT also noted cholelithiasis. Her TBili is elevated at 1.7 and ALT elevated at 42 suggesting a cholestasis pattern. Possible that her pancreatitis is gallstone-induced. She does not drink alcohol. She has not had elevated triglycerides in the past. She is on lisinopril and ACE inhibitors have been known to cause pancreatitis, but gallstones more likely etiology.  - Will give 1 L bolus - Start IVF @ 150 after bolus - NPO - Abd U/S to assess for stones in biliary system - Check lipid panel - Check ethanol - Zofran PRN nausea   New Onset AFib with RVR- Resolved: Patient noted to be in AFib  with RVR with HR in 170-180s. She was placed on a Cardizem gtt in the ED and she converted to NSR. No prior history of arrhythmias. She does have a history of hypothyroidism, but according to last clinic note in July 2015 she had not been taking her Synthroid for awhile and last TSH 3.077. Patient denies taking Synthroid currently. Will check TSH. Troponin negative x 1. Her electrolytes are all within normal range. She last had an echo in Nov 2011 which showed EF 60-65%, no wall motion abnormalities, grade I diastolic dysfunction. Unclear etiology, but could be related to hypothyroidism since has not been taking synthroid for some time vs. Cardiac etiology. CHADS-Vasc score 5, giving her a 7.2% risk of stroke per year.  - Stop Cardizem - Check TSH - Check BNP - Consider echocardiogram  - Cardiac monitoring   UTI: Patient notes frequency, but denies dysuria, hematuria, flank pain. UA was noted to have many bacteria, 3-6 WBCs, +nitrites, and small leukocytes.  - Start Rocephin 1 g IV - f/u urine culture   Bilateral Leg Pain: Patient noted to have tenderness to palpation in both lower extremities. Lower extremities appear symmetric and no erythema present. She has trace edema bilaterally. She is on a statin which could be contributing to her symptoms vs. Possible DVT.  - Check CK - Check bilateral dopplers   Elevated ALT: ALT 42, AST 21. Her ALT has not been elevated in the past. Likely represents a cholestasis pattern. Her TBili is elevated at 1.7. Most likely related to her pancreatitis and possibly gallstone-induced since cholelithiasis noted on CT Abd.  - Check abdominal ultrasound to assess for stone in biliary system  - Check hepatitis panel  - Repeat CMET in AM  HTN: BP 144/128 on admission. She takes Lisinopril 10 mg daily and Lasix 20 mg every other day at home. - Hold PO meds for now given NPO - Can give Lopressor 2.5 mg IV if BP becomes elevated     Diet: NPO, can transition to  clears tomorrow AM VTE PPx: Lovenox SQ Dispo: Disposition is deferred at this time, awaiting improvement of current medical problems. Anticipated discharge in approximately 1-2 day(s).   The patient does have a current PCP Baltazar Apo, MD) and does need an [facility] hospital follow-up appointment after discharge.  The patient does not have transportation limitations that hinder transportation to clinic appointments.  Signed: Rich Number, MD 06/20/2014, 5:13 PM

## 2014-06-20 NOTE — Progress Notes (Signed)
Teaching service at the bedside. No new orders at this time.   Will continue to monitor.   Edgardo RoysMcGrath, Susie Pousson R

## 2014-06-20 NOTE — Progress Notes (Signed)
Patient rhythm frequently converting to ventricular bigeminy. Teaching service Dr. Tasia CatchingsAhmed notified at 2310.  Patient resting comfortably and in no distress. HR 89.    Will continue to monitor.   Edgardo RoysMcGrath, Henriette Hesser R

## 2014-06-20 NOTE — ED Notes (Signed)
[facility]- Pt has had n/v x2 days. Also c/o abd pain and back pain. Pt tachy on EMS arrival, noted to be in a-fib when placed on the monitor,rate between 140-160. Pt given 10mg  of cardizem en route.

## 2014-06-21 DIAGNOSIS — I10 Essential (primary) hypertension: Secondary | ICD-10-CM | POA: Diagnosis present

## 2014-06-21 DIAGNOSIS — M170 Bilateral primary osteoarthritis of knee: Secondary | ICD-10-CM | POA: Diagnosis present

## 2014-06-21 DIAGNOSIS — M79606 Pain in leg, unspecified: Secondary | ICD-10-CM | POA: Diagnosis not present

## 2014-06-21 DIAGNOSIS — K219 Gastro-esophageal reflux disease without esophagitis: Secondary | ICD-10-CM | POA: Diagnosis present

## 2014-06-21 DIAGNOSIS — E876 Hypokalemia: Secondary | ICD-10-CM | POA: Diagnosis present

## 2014-06-21 DIAGNOSIS — I480 Paroxysmal atrial fibrillation: Secondary | ICD-10-CM

## 2014-06-21 DIAGNOSIS — R197 Diarrhea, unspecified: Secondary | ICD-10-CM

## 2014-06-21 DIAGNOSIS — B9689 Other specified bacterial agents as the cause of diseases classified elsewhere: Secondary | ICD-10-CM | POA: Diagnosis not present

## 2014-06-21 DIAGNOSIS — N390 Urinary tract infection, site not specified: Secondary | ICD-10-CM

## 2014-06-21 DIAGNOSIS — K521 Toxic gastroenteritis and colitis: Secondary | ICD-10-CM | POA: Diagnosis not present

## 2014-06-21 DIAGNOSIS — I4891 Unspecified atrial fibrillation: Secondary | ICD-10-CM | POA: Diagnosis not present

## 2014-06-21 DIAGNOSIS — E785 Hyperlipidemia, unspecified: Secondary | ICD-10-CM | POA: Diagnosis present

## 2014-06-21 DIAGNOSIS — E039 Hypothyroidism, unspecified: Secondary | ICD-10-CM | POA: Diagnosis present

## 2014-06-21 DIAGNOSIS — R7989 Other specified abnormal findings of blood chemistry: Secondary | ICD-10-CM

## 2014-06-21 DIAGNOSIS — R600 Localized edema: Secondary | ICD-10-CM | POA: Diagnosis present

## 2014-06-21 DIAGNOSIS — T3695XA Adverse effect of unspecified systemic antibiotic, initial encounter: Secondary | ICD-10-CM | POA: Diagnosis present

## 2014-06-21 DIAGNOSIS — K851 Biliary acute pancreatitis: Principal | ICD-10-CM

## 2014-06-21 DIAGNOSIS — Z8701 Personal history of pneumonia (recurrent): Secondary | ICD-10-CM | POA: Diagnosis not present

## 2014-06-21 DIAGNOSIS — K8020 Calculus of gallbladder without cholecystitis without obstruction: Secondary | ICD-10-CM | POA: Diagnosis present

## 2014-06-21 DIAGNOSIS — Z7982 Long term (current) use of aspirin: Secondary | ICD-10-CM | POA: Diagnosis not present

## 2014-06-21 DIAGNOSIS — R112 Nausea with vomiting, unspecified: Secondary | ICD-10-CM | POA: Diagnosis not present

## 2014-06-21 DIAGNOSIS — M810 Age-related osteoporosis without current pathological fracture: Secondary | ICD-10-CM | POA: Diagnosis present

## 2014-06-21 DIAGNOSIS — Z90710 Acquired absence of both cervix and uterus: Secondary | ICD-10-CM | POA: Diagnosis not present

## 2014-06-21 DIAGNOSIS — F418 Other specified anxiety disorders: Secondary | ICD-10-CM | POA: Diagnosis present

## 2014-06-21 DIAGNOSIS — Z6841 Body Mass Index (BMI) 40.0 and over, adult: Secondary | ICD-10-CM | POA: Diagnosis not present

## 2014-06-21 DIAGNOSIS — K5790 Diverticulosis of intestine, part unspecified, without perforation or abscess without bleeding: Secondary | ICD-10-CM | POA: Diagnosis present

## 2014-06-21 DIAGNOSIS — R159 Full incontinence of feces: Secondary | ICD-10-CM | POA: Diagnosis present

## 2014-06-21 LAB — LIPID PANEL
CHOL/HDL RATIO: 3.8 ratio
Cholesterol: 121 mg/dL (ref 0–200)
HDL: 32 mg/dL — ABNORMAL LOW (ref >39)
LDL CALC: 74 mg/dL (ref 0–99)
TRIGLYCERIDES: 77 mg/dL (ref <150)
VLDL: 15 mg/dL (ref 0–40)

## 2014-06-21 LAB — COMPREHENSIVE METABOLIC PANEL
ALBUMIN: 2.5 g/dL — AB (ref 3.5–5.2)
ALT: 28 U/L (ref 0–35)
AST: 17 U/L (ref 0–37)
Alkaline Phosphatase: 84 U/L (ref 39–117)
Anion gap: 7 (ref 5–15)
BUN: 12 mg/dL (ref 6–23)
CALCIUM: 8.3 mg/dL — AB (ref 8.4–10.5)
CO2: 25 mmol/L (ref 19–32)
CREATININE: 0.84 mg/dL (ref 0.50–1.10)
Chloride: 103 mmol/L (ref 96–112)
GFR calc Af Amer: 77 mL/min — ABNORMAL LOW (ref >90)
GFR calc non Af Amer: 66 mL/min — ABNORMAL LOW (ref >90)
Glucose, Bld: 79 mg/dL (ref 70–99)
Potassium: 3.4 mmol/L — ABNORMAL LOW (ref 3.5–5.1)
Sodium: 135 mmol/L (ref 135–145)
TOTAL PROTEIN: 5.9 g/dL — AB (ref 6.0–8.3)
Total Bilirubin: 1.5 mg/dL — ABNORMAL HIGH (ref 0.3–1.2)

## 2014-06-21 LAB — CBC
HCT: 36.5 % (ref 36.0–46.0)
HEMOGLOBIN: 11.5 g/dL — AB (ref 12.0–15.0)
MCH: 28.4 pg (ref 26.0–34.0)
MCHC: 31.5 g/dL (ref 30.0–36.0)
MCV: 90.1 fL (ref 78.0–100.0)
PLATELETS: 204 10*3/uL (ref 150–400)
RBC: 4.05 MIL/uL (ref 3.87–5.11)
RDW: 14.3 % (ref 11.5–15.5)
WBC: 13.2 10*3/uL — ABNORMAL HIGH (ref 4.0–10.5)

## 2014-06-21 LAB — HEPATITIS PANEL, ACUTE
HCV Ab: NEGATIVE
Hep A IgM: NONREACTIVE
Hep B C IgM: NONREACTIVE
Hepatitis B Surface Ag: NEGATIVE

## 2014-06-21 LAB — MRSA PCR SCREENING: MRSA by PCR: POSITIVE — AB

## 2014-06-21 LAB — URINE CULTURE: Colony Count: >=100000

## 2014-06-21 LAB — TROPONIN I
TROPONIN I: 0.04 ng/mL — AB (ref <0.031)
Troponin I: 0.21 ng/mL — ABNORMAL HIGH (ref <0.031)

## 2014-06-21 MED ORDER — TRAMADOL HCL 50 MG PO TABS: 50.0000 mg | ORAL_TABLET | Freq: Every day | ORAL | Status: DC | PRN

## 2014-06-21 MED ORDER — PANTOPRAZOLE SODIUM 40 MG PO TBEC
40.0000 mg | DELAYED_RELEASE_TABLET | Freq: Every day | ORAL | Status: DC
  Administered 2014-06-21 – 2014-06-22 (×2): 40 mg via ORAL
  Filled 2014-06-21 (×2): qty 1

## 2014-06-21 MED ORDER — SERTRALINE HCL 25 MG PO TABS
25.0000 mg | ORAL_TABLET | Freq: Every day | ORAL | Status: DC
  Administered 2014-06-21 – 2014-06-22 (×2): 25 mg via ORAL
  Filled 2014-06-21 (×2): qty 1

## 2014-06-21 MED ORDER — OFF THE BEAT BOOK
Freq: Once | Status: AC
  Administered 2014-06-21: 18:00:00
  Filled 2014-06-21: qty 1

## 2014-06-21 MED ORDER — POTASSIUM CHLORIDE CRYS ER 20 MEQ PO TBCR
40.0000 meq | EXTENDED_RELEASE_TABLET | Freq: Once | ORAL | Status: AC
  Administered 2014-06-21: 40 meq via ORAL
  Filled 2014-06-21: qty 2

## 2014-06-21 MED ORDER — SIMVASTATIN 10 MG PO TABS
10.0000 mg | ORAL_TABLET | Freq: Every day | ORAL | Status: DC
  Administered 2014-06-21: 10 mg via ORAL
  Filled 2014-06-21 (×2): qty 1

## 2014-06-21 MED ORDER — LISINOPRIL 10 MG PO TABS
10.0000 mg | ORAL_TABLET | Freq: Every day | ORAL | Status: DC
  Administered 2014-06-21 – 2014-06-22 (×2): 10 mg via ORAL
  Filled 2014-06-21 (×2): qty 1

## 2014-06-21 MED ORDER — PERFLUTREN LIPID MICROSPHERE
1.0000 mL | INTRAVENOUS | Status: AC | PRN
  Administered 2014-06-21: 8 mL via INTRAVENOUS
  Filled 2014-06-21: qty 10

## 2014-06-21 NOTE — Clinical Social Work Note (Signed)
Contacted patient's son and left a message on voice mail to call back CSW to discuss if patient needs to go to a SNF.  Completed FL2 on chart awaiting signature by physician.  Ervin KnackEric R. Kaseem Vastine, MSW, Theresia MajorsLCSWA [phone] 06/21/2014 4:28 PM

## 2014-06-21 NOTE — Progress Notes (Signed)
Echocardiogram 2D Echocardiogram has been performed.  Dorothey BasemanReel, Lonney Revak M 06/21/2014, 3:07 PM

## 2014-06-21 NOTE — Progress Notes (Signed)
VASCULAR LAB PRELIMINARY  PRELIMINARY  PRELIMINARY  PRELIMINARY  Bilateral lower extremity venous duplex  completed.    Preliminary report:  Bilateral:  No evidence of DVT, superficial thrombosis, or Baker's Cyst.   Terrell Ostrand, RVT 06/21/2014, 12:01 PM
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
## 2014-06-21 NOTE — Progress Notes (Signed)
Notified Dr. Tasia CatchingsAhmed with teaching service about elevated troponin result at 1600. Troponin level .21  No previous note of notification.  Pt sitting in the chair resting comfortably and no sign of distress.   Will continue to monitor.   Edgardo RoysMcGrath, Ahyan Kreeger R

## 2014-06-21 NOTE — Progress Notes (Signed)
Nutrition Brief Note  Patient identified on the Malnutrition Screening Tool (MST) Report.  Wt Readings from Last 15 Encounters:  06/21/14 216 lb 11.2 oz (98.294 kg)  11/20/13 236 lb 8 oz (107.276 kg)  04/03/13 233 lb 1.6 oz (105.733 kg)  10/17/12 233 lb 12.8 oz (106.051 kg)  09/05/12 235 lb 4.8 oz (106.731 kg)  07/31/12 233 lb 14.4 oz (106.096 kg)  06/29/12 242 lb 8 oz (109.997 kg)  01/26/12 252 lb 9.6 oz (114.579 kg)  09/30/11 245 lb 1.6 oz (111.177 kg)  07/21/11 241 lb 3.2 oz (109.408 kg)  07/08/11 238 lb 3.2 oz (108.047 kg)  10/19/10 239 lb 11.2 oz (108.727 kg)  10/15/10 240 lb 9.6 oz (109.135 kg)  09/04/10 239 lb 14.4 oz (108.818 kg)  08/28/10 249 lb 9.6 oz (113.218 kg)    Body mass index is 43.74 kg/(m^2). Patient meets criteria for class 3, extreme/morbid obesity based on current BMI. Minor weight loss (~8% of usual weight) over the past 3-6 months.  Current diet order is clear liquids, patient is consuming approximately 100% of meals at this time. Labs and medications reviewed.   No nutrition interventions warranted at this time. If nutrition issues arise, please consult RD.   Joaquin CourtsKimberly Harris, RD, LDN, CNSC Pager [phone] After Hours Pager [phone]

## 2014-06-21 NOTE — Progress Notes (Signed)
Subjective:    Currently, the patient reports significant improvement in her nausea and vomiting. She denies any abdominal pain currently reports being hungry and ready to eat. She does report upper abdominal pain after meals in the past with radiation to her back that resolves on its own.  She denies any palpitations currently, but she did report palpitations on arrival to the ER. She's had similar palpitations over the past few months that occur when she's stressed, she says these typically occur when she is arguing with her son. She says her leg pain is improved this morning, and she denies any difficulty urinating.  Interval Events: -White blood cell count and liver function tests improving this morning. -BNP slightly elevated at 350. -TSH normal. -Ventricular bigeminy observed on telemetry, patient asymptomatic. -Right upper quadrant ultrasound showed multiple small gallstones, negative for cholecystitis or other acute abnormality.    Objective:    Vital Signs:   Temp:  [98.2 F (36.8 C)-98.6 F (37 C)] 98.5 F (36.9 C) (02/19 0603) Pulse Rate:  [56-86] 84 (02/19 0603) Resp:  [13-24] 18 (02/19 0603) BP: (101-148)/(60-128) 138/71 mmHg (02/19 0603) SpO2:  [93 %-99 %] 98 % (02/19 0603) Weight:  [216 lb 1 oz (98.005 kg)-216 lb 11.2 oz (98.294 kg)] 216 lb 11.2 oz (98.294 kg) (02/19 0603) Last BM Date: 06/20/14  24-hour weight change: Weight change:   Intake/Output:   Intake/Output Summary (Last 24 hours) at 06/21/14 0910 Last data filed at 06/21/14 0842  Gross per 24 hour  Intake      0 ml  Output      0 ml  Net      0 ml      Physical Exam: General: Well-developed, well-nourished, in no acute distress; alert, appropriate and cooperative throughout examination.  Lungs:  Normal respiratory effort. Clear to auscultation BL without crackles or wheezes.  Heart: RRR. S1 and S2 normal without gallop, murmur, or rubs.  Abdomen:  BS normoactive. Soft, Nondistended, non-tender.   No masses or organomegaly.  Extremities: No pretibial edema.     Labs:  Basic Metabolic Panel:  Recent Labs Lab 06/20/14 1140 06/20/14 1900 06/21/14 0530  NA 134*  --  135  K 3.8  --  3.4*  CL 103  --  103  CO2 21  --  25  GLUCOSE 93  --  79  BUN 22  --  12  CREATININE 0.99  --  0.84  CALCIUM 9.0  --  8.3*  MG  --  2.0  --   PHOS  --  3.0  --     Liver Function Tests:  Recent Labs Lab 06/20/14 1140 06/20/14 1900 06/21/14 0530  AST 21  --  17  ALT 42*  --  28  ALKPHOS 97  --  84  BILITOT 1.7* 1.1 1.5*  PROT 7.0  --  5.9*  ALBUMIN 3.1*  --  2.5*    Recent Labs Lab 06/20/14 1140  LIPASE 60*   CBC:  Recent Labs Lab 06/20/14 1140 06/21/14 0530  WBC 16.0* 13.2*  NEUTROABS 12.2*  --   HGB 13.8 11.5*  HCT 42.5 36.5  MCV 90.0 90.1  PLT 211 204    Cardiac Enzymes:  Recent Labs Lab 06/20/14 1900  CKTOTAL 85   Microbiology: Results for orders placed or performed during the hospital encounter of 06/20/14  MRSA PCR Screening     Status: Abnormal   Collection Time: 06/20/14  6:40 PM  Result Value Ref Range  Status   MRSA by PCR POSITIVE (A) NEGATIVE Final    Comment:        The GeneXpert MRSA Assay (FDA approved for NASAL specimens only), is one component of a comprehensive MRSA colonization surveillance program. It is not intended to diagnose MRSA infection nor to guide or monitor treatment for MRSA infections. RESULT CALLED TO, READ BACK BY AND VERIFIED WITH: H.MCGRATH,RN AT 0057 BY L.PITT 06/21/14     Coagulation Studies:  Recent Labs  06/20/14 1900  LABPROT 15.1  INR 1.18    Imaging: Dg Chest 2 View  06/20/2014   CLINICAL DATA:  Atrial fibrillation  EXAM: CHEST  2 VIEW  COMPARISON:  05/31/2012  FINDINGS: Normal heart size and mediastinal contours. Chronic pulmonary hyperinflation. No acute infiltrate or edema. No effusion or pneumothorax. No acute osseous findings.  IMPRESSION: 1. No change to suggest acute cardiopulmonary  disease. 2. Chronic hyperinflation.   Electronically Signed   By: Marnee SpringJonathon  Watts M.D.   On: 06/20/2014 15:40   Ct Abdomen Pelvis W Contrast  06/20/2014   CLINICAL DATA:  Periumbilical abdominal pain.  EXAM: CT ABDOMEN AND PELVIS WITH CONTRAST  TECHNIQUE: Multidetector CT imaging of the abdomen and pelvis was performed using the standard protocol following bolus administration of intravenous contrast.  CONTRAST:  100mL OMNIPAQUE IOHEXOL 300 MG/ML  SOLN  COMPARISON:  04/28/2011  FINDINGS: BODY WALL: Marked pelvic floor laxity  LOWER CHEST: Unremarkable.  ABDOMEN/PELVIS:  Liver: Subcapsular cyst in the left liver measuring 16 mm.  Biliary: Cholelithiasis without evidence of acute cholecystitis or biliary obstruction.  Pancreas: Expansion of the tail and neighboring body with edema in the retroperitoneal upper abdominal ligaments. There is no necrosis or focal fluid collection. No acute vascular findings.  Spleen: Unremarkable.  Adrenals: Unremarkable.  Kidneys and ureters: No hydronephrosis or asymmetric renal enhancement. Study of limited utility for detecting stone given excretory phase imaging.  Bladder: Limited evaluation given collapsed state.  Reproductive: Hysterectomy and oophorectomies  Bowel: Colonic diverticulosis, especially in the sigmoid colon. The appendix is distended, but completely fills with oral contrast and is not thick walled.  Retroperitoneum: No mass or adenopathy.  Peritoneum: No ascites or pneumoperitoneum.  Vascular: No acute abnormality.  OSSEOUS: Remote T9 compression fracture with moderate height loss.  IMPRESSION: 1. Acute pancreatitis without fluid collection or necrosis. 2. Cholelithiasis. 3. Colonic diverticulosis.   Electronically Signed   By: Marnee SpringJonathon  Watts M.D.   On: 06/20/2014 15:03   [place] Abdomen Limited Ruq  06/20/2014   CLINICAL DATA:  Abdominal pain.  Acute pancreatitis.  EXAM: US ABDOMEN LIMITED - RIGHT UPPER QUADRANT  COMPARISON:  CT abdomen and pelvis earlier this same  day.  FINDINGS: Gallbladder:  Multiple small stones are identified measuring up to 0.8 cm. There is no gallbladder wall thickening, pericholecystic fluid or sonographic Murphy's sign.  Common bile duct:  Diameter: 0.5 cm  Liver:  No focal lesion identified. Within normal limits in parenchymal echogenicity.  IMPRESSION: Multiple small gallstones. Negative for cholecystitis or other acute abnormality.   Electronically Signed   By: Drusilla Kannerhomas  Dalessio M.D.   On: 06/20/2014 21:46       Medications:    Infusions:    Scheduled Medications: . cefTRIAXone (ROCEPHIN)  IV  1 g Intravenous Q24H  . enoxaparin (LOVENOX) injection  40 mg Subcutaneous Q24H  . Influenza vac split quadrivalent PF  0.5 mL Intramuscular Tomorrow-1000  . off the beat book   Does not apply Once  . pantoprazole (PROTONIX) IV  40  mg Intravenous Q24H  . sodium chloride  3 mL Intravenous Q12H    PRN Medications: morphine injection, ondansetron (ZOFRAN) IV   Assessment/ Plan:    Principal Problem:   Acute pancreatitis Active Problems:   Hyperlipidemia   Morbid obesity with BMI of 45.0-49.9, adult   Depression with anxiety   Essential hypertension   GERD   Edema of both legs   UTI (urinary tract infection)   Elevated alanine aminotransferase (ALT) level   Cholelithiasis   Diverticulosis  #Acute gallstone pancreatitis Nausea, vomiting, and abdominal pain have resolved. She was noted to have gallstones in her gallbladder on CT and ultrasound, which is likely the cause of her pancreatitis. She does report typical colicky abdominal pain consistent with gallstone disease in the past, so she would benefit from an elective cholecystectomy in the future. Her liver function tests have improved this morning, suggesting no current stone in the common bile duct. Ethanol negative. Hepatitis panel negative. -Advance diet to clears, then as tolerated. -Discontinue fluids. -Zofran when necessary for nausea. -Morphine 1 mg every 4  hours as needed for pain. -Check lipid panel. -Referral as outpatient for elective cholecystectomy.  #New onset A. fib with RVR She had some symptomatic A. fib with RVR in the emergency room, and reports similar episodes in the past. She is currently in sinus rhythm. This suggests that she may have paroxysmal atrial fibrillation. Her BNP is slightly elevated, suggesting some underlying heart failure. Her last echo was in 2011, so we'll repeat that during the hospitalization. TSH is normal despite being off of thyroid medications. She has some ventricular bigeminy, so we'll repeat a troponin and EKG. CHADS2-VASC of 4 (age 74, hypertension, female), so she would benefit from anticoagulation. She did have some chronic pulmonary hyperinflation on chest x-ray despite not smoking, question if this could contribute to her atrial fibrillation. -Discuss anticoagulation options. -Echocardiogram. -Repeat EKG and troponin this morning. -Continue telemetry. -May need to add rate control if this is chronic.  #UTI -Continue Rocephin IV, day 2 of 5. -Follow-up urine culture.  #Bilateral leg pain She does have severe osteoarthritis in her knees, which may be the etiology of her leg pain. No pain on palpation today. CK negative. No evidence of unilateral swelling. -Follow-up lower extremity Dopplers. -Restart home tramadol 50 mg daily as needed.  #Hypertension Normotensive this morning. -Continue to hold home Lasix 20 mg every other day with no evidence of edema.  -Restart home lisinopril 10 mg daily.  #Hypokalemia Normal magnesium. -K Dur 40 mEq once.  #Hyperlipidemia -Restart home simvastatin.  #GERD -Restart home Protonix.  #Depression -Restart home Zoloft.   DVT PPX - low molecular weight heparin  CODE STATUS - Full.  CONSULTS PLACED - None.  DISPO - Disposition is deferred at this time, awaiting improvement of pancreatitis.   Anticipated discharge in approximately 1-2 day(s).    The patient does have a current PCP Darden Palmer, MD) and does need an [facility] hospital follow-up appointment after discharge.    Is the [facility] hospital follow-up appointment a one-time only appointment? no.  Does the patient have transportation limitations that hinder transportation to clinic appointments? yes   SERVICE NEEDED AT DISCHARGE - TO BE DETERMINED DURING HOSPITAL COURSE         Y = Yes, Blank = No PT:   OT:   RN:   Equipment:   Other:      Length of Stay: 1 day(s)   Signed: Luisa Dago, MD  PGY-1, Internal Medicine  Resident Pager: [phone] (7AM-5PM) 06/21/2014, 9:10 AM

## 2014-06-21 NOTE — Progress Notes (Signed)
Nurse notified by central telemetry patient converted to afib in the 150s. Pt. Sitting in the chair with no signs of distress and call bell in reach. Dr. Tasia CatchingsAhmed notified.   Will continue to monitor.   Edgardo RoysMcGrath, Miyoshi Ligas R

## 2014-06-21 NOTE — Progress Notes (Signed)
UR completed 

## 2014-06-21 NOTE — Clinical Social Work Note (Signed)
Clinical Social Work Department BRIEF PSYCHOSOCIAL ASSESSMENT 06/21/2014  Patient:  Lisa Haynes, Lisa Haynes     Account Number:  [id number]     Admit date:  06/20/2014  Clinical Social Worker:  Dian Queen  Date/Time:  06/21/2014 03:41 PM  Referred by:  Physician  Date Referred:  06/21/2014 Referred for  SNF Placement   Other Referral:   Interview type:  Patient Other interview type:    PSYCHOSOCIAL DATA Living Status:  FAMILY Admitted from facility:   Level of care:   Primary support name:  Lisa Haynes Primary support relationship to patient:  CHILD, ADULT Degree of support available:   Son lives with patient and helps take care of her, however she has falling recently and her son has not been able to help her up, so EMS has been called.    CURRENT CONCERNS Current Concerns  Post-Acute Placement   Other Concerns:    SOCIAL WORK ASSESSMENT / PLAN Patient is a 74 year old female whose son lives with her. Patient is alert and oriented x3 and pleasant to talk to. Patient has Medicare and Medicaid so she will need a 3 day stay in order for her benefits to cover SNF, she has only been here for one day so far.  CSW met with patient and explained to her the role of CSW about trying to find placement due to recommendations from PT.  Patient asked about home health and she said she would prefer to have home health, because she has had it in the past.  Patient said she is in agreement to go to SNF for short term rehab if she needs to.  Patient asked to talk with her son Quillian Quince who lives with her to see if he thinks it is a good idea to go somewhere for short term rehab.  Attempted to call patient's son, and message was left for son.  Patient is in agreement to going to SNF once she is medically ready and discharge orders have been received.  Patient stated she does not know anything about SNFs available to her, list of facilities was left in her room, so patient can look at it.    Assessment/plan status:  Psychosocial Support/Ongoing Assessment of Needs Other assessment/ plan:   Information/referral to community resources:    PATIENT'S/FAMILY'S RESPONSE TO PLAN OF CARE: Patient in agreement to going to SNF if she has to. Patient's son has not contacted CSW yet.   Jones Broom. Maurice, MSW, [place] 06/21/2014 3:53 PM

## 2014-06-21 NOTE — Progress Notes (Signed)
2100 Dr. Tasia CatchingsAhmed at the bedside. No new orders given. Cdiff protocol initiated.   Will continue to monitor.   Edgardo RoysMcGrath, Crestina Strike R
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
## 2014-06-21 NOTE — Progress Notes (Signed)
Went to see patient after RN called saying central telemetry notified her about patient being in afib rate 150's. Asked to get EKG. EKG shows NSR. Patient is very asymptomatic. Denies any chest pain, palpitation, SOB, or any other symptom. Looked through tele records, was in sinus tach few times, mainly when she gets anxious per RN such as being on the phone.   Also having few episodes diarrhea per patient. Usually gets diarrhea when she takes in tea or lemon juice. RN is checking for cdiff.  Exam:  Heent: normal Cardio: rrr, no m/r/g Lung: ctab Abd: soft, good bs. Filed Vitals:   06/21/14 2041  BP: 133/56  Pulse: 78  Temp: 98.3 F (36.8 C)  Resp: 18     A/p:  paroxsymal afib- - currently in NSR. - Continue to monitor. primary team plans to start anticoag tomorrow.  Elevated troponin  - could be from the acute pancreatitis. No chest pain.  - will trend 2 more, will start heparin drip and call cardiology if trop is >0.5 per primary team   Diarrhea - likely related to tea. Asked patient to avoid tea and just drink water for now. - unit policy checking cdiff. F/up.

## 2014-06-21 NOTE — Progress Notes (Signed)
  Date: 06/21/2014  Patient name: Lisa Haynes  Medical record number: [id number]  Date of birth: 02/15/1941   I have seen and evaluated Lisa EdinPatsy J Bertucci and discussed their care with the Residency Team. Ms Carlean JewsRoland was admitted for Abd pain, N/V. She was dx with gallstone pancreatitis and her sxs have resolved and she is tolerating clears. She had A Fib with RVR in the ED and resolved to sinus with cardiazem and time.   NAD. Having trouble getting positioned in bed. H sinus. No MRG. ABD + BS, soft, NT. Ext no edema. +2 DP pulses.   Assessment and Plan: I have seen and evaluated the patient as outlined above. I agree with the formulated Assessment and Plan as detailed in the residents' admission note, with the following changes:   1. Acute gallstone pancreatitis - no lab or radiographic signs that there are retained stones in ducts. She does have stones in GB. Currently, her sxs are improving and she tolerated clears for lunch without increase in her pain although they did cause fecal incontinence. She is being tx symptomatically and her diet will be advanced and if able to eat regular food, she might go home in AM. Agree with outpt surgery consult for elective cholecystectomy.   2. Paroxysmal A Fib with RVR - now in sinus. It is possible that her A Fib with RVR was the result of her pancreatitis. However, her pancreatitis was quite mild. And the literature states a very high likelihood of recurrence. She does explain some heart palpitations in the past, mostly at night when getting ready for bed. I agree with ECHO to assess structure of LV and LA. NAOC for anticoagulation. No h/o GI bleed and HgB stable.   3. UTI - she describes long h/o polyuria but it has worsened recently. UA is suggestive of UTI. Agree with ABX and transition to PO once cx returned.   Possible D/C in AM if tolerates solid food.  Burns SpainElizabeth A Butcher, MD 2/19/20162:39 PM

## 2014-06-22 ENCOUNTER — Other Ambulatory Visit: Payer: Self-pay | Admitting: Internal Medicine

## 2014-06-22 LAB — TROPONIN I
TROPONIN I: 0.03 ng/mL (ref <0.031)
Troponin I: 0.04 ng/mL — ABNORMAL HIGH (ref <0.031)

## 2014-06-22 MED ORDER — FUROSEMIDE 20 MG PO TABS: 20.0000 mg | ORAL_TABLET | Freq: Every day | ORAL | Status: DC | PRN

## 2014-06-22 MED ORDER — NYSTATIN 100000 UNIT/GM EX CREA: TOPICAL_CREAM | Freq: Two times a day (BID) | CUTANEOUS | Status: DC

## 2014-06-22 MED ORDER — ASPIRIN EC 81 MG PO TBEC
81.0000 mg | DELAYED_RELEASE_TABLET | Freq: Every day | ORAL | Status: DC
  Administered 2014-06-22: 81 mg via ORAL
  Filled 2014-06-22: qty 1

## 2014-06-22 MED ORDER — ASPIRIN 81 MG PO TBEC: 81.0000 mg | DELAYED_RELEASE_TABLET | Freq: Every day | ORAL | Status: DC

## 2014-06-22 MED ORDER — NYSTATIN 100000 UNIT/GM EX POWD: CUTANEOUS | Status: DC

## 2014-06-22 MED ORDER — NYSTATIN 100000 UNIT/GM EX POWD
Freq: Two times a day (BID) | CUTANEOUS | Status: DC
  Administered 2014-06-22: 12:00:00 via TOPICAL
  Filled 2014-06-22: qty 15

## 2014-06-22 MED ORDER — NYSTATIN 100000 UNIT/GM EX CREA
TOPICAL_CREAM | Freq: Two times a day (BID) | CUTANEOUS | Status: DC
  Administered 2014-06-22: 12:00:00 via TOPICAL
  Filled 2014-06-22: qty 15

## 2014-06-22 NOTE — Progress Notes (Signed)
Patient experienced significant confusion and began to visually hallucinate over night   Pt currently resting comfortably, call bell within reach and bed alarm on.   Will continue to monitor.   Edgardo RoysMcGrath, Laquinda Moller R

## 2014-06-22 NOTE — Care Management Note (Signed)
    Page 1 of 2   06/22/2014     3:02:36 PM CARE MANAGEMENT NOTE 06/22/2014  Patient:  Lisa Haynes, Lisa Haynes   Account Number:  [id number]  Date Initiated:  06/22/2014  Documentation initiated by:  [facility]  Subjective/Objective Assessment:   adm: acute pancreatitis     Action/Plan:   discharge planning   Anticipated DC Date:  06/22/2014   Anticipated DC Plan:  [place]  CM consult      [facility] Choice  HOME HEALTH   Choice offered to / List presented to:  C-4 Adult Children        [place] arranged  HH-1 RN  [place].   Status of service:  Completed, signed off Medicare Important Message given?   (If response is "NO", the following Medicare IM given date fields will be blank) Date Medicare IM given:   Medicare IM given by:   Date Additional Medicare IM given:   Additional Medicare IM given by:    Discharge Disposition:  [place]  Per UR Regulation:    If discussed at Long Length of Stay Meetings, dates discussed:    Comments:  06/22/14 Cm met with pt in room but pt is confused and unable to accurately communicate her needs.  Cm called pt's son, Quillian Quince  [phone] to offer choice of home health agency. Quillian Quince states the agency can use his number or [phone] if no answer at his number.  Address was verified with Quillian Quince.  Referral called to [facility] rep, [place] with Daniel's contact numbers.  No other CM needs were communicated. Mariane Masters, BSN, CM [phone].

## 2014-06-22 NOTE — Progress Notes (Signed)
CSW (Clinical Child psychotherapistocial Worker) noted pt increased confusion. CSW attempted to reach pt son with no success. CSW spoke with pt nurse who informed CSW pt is currently refusing SNF and MD plans to dc pt home today. CSW asked for pt nurse to work on getting in contact with pt son or obtaining correct contact information if pt son calls/visits. CSW also notified that if plan is for home it may be beneficial for pt to have home health social worker ordered. CSW left voicemail for RNCM to notify of change in plans and need for home health social worker if pt qualifies for services. At this time, pt has no further hospital social work needs. CSW signing off.  Nedim Oki Lajean Savermbelal, LCSWA Weekend CSW [phone]

## 2014-06-22 NOTE — Discharge Summary (Signed)
Name: Lisa Haynes MRN: [id number] DOB: 12-Dec-1940 74 y.o. PCP: Baltazar Apo, MD  Date of Admission: 06/20/2014 11:09 AM Date of Discharge: 06/22/2014 Attending Physician: Aletta Edouard, MD  Discharge Diagnosis: Principal Problem:   Acute pancreatitis Active Problems:   Hyperlipidemia   Morbid obesity with BMI of 45.0-49.9, adult   Depression with anxiety   Essential hypertension   GERD   Edema of both legs   UTI (urinary tract infection)   Elevated alanine aminotransferase (ALT) level   Cholelithiasis   Diverticulosis  Discharge Medications:   Medication List    TAKE these medications        acetaminophen 325 MG tablet  Commonly known as:  TYLENOL  Take 2 tablets (650 mg total) by mouth every 6 (six) hours as needed for pain.     aspirin 81 MG EC tablet  Take 1 tablet (81 mg total) by mouth daily.     Calcium Carbonate-Vitamin D 600-400 MG-UNIT per tablet  Commonly known as:  CALCARB 600/D  Take 1 tablet by mouth 3 (three) times daily with meals.     furosemide 20 MG tablet  Commonly known as:  LASIX  Take 1 tablet (20 mg total) by mouth daily as needed.     lisinopril 10 MG tablet  Commonly known as:  PRINIVIL,ZESTRIL  TAKE 1 TABLET BY MOUTH EVERY DAY     Lumbar Back Brace/Support Pad Misc  1 each by Does not apply route once.     multivitamin with minerals tablet  Take 1 tablet by mouth daily.     nystatin cream  Commonly known as:  MYCOSTATIN  Apply topically 2 (two) times daily.     nystatin 100000 UNIT/GM Powd  Apply after cream to intertrigo BID.     omeprazole 40 MG capsule  Commonly known as:  PRILOSEC  TAKE 1 CAPSULE BY MOUTH DAILY     sertraline 25 MG tablet  Commonly known as:  ZOLOFT  TAKE 1 TABLET BY MOUTH EVERY DAY     simvastatin 10 MG tablet  Commonly known as:  ZOCOR  TAKE 1 TABLET BY MOUTH AT BEDTIME     traMADol 50 MG tablet  Commonly known as:  ULTRAM  Take 1 tablet (50 mg total) by mouth daily as needed.     zoster vaccine live (PF) 19400 UNT/0.65ML injection  Commonly known as:  ZOSTAVAX  Inject 19,400 Units into the skin once.        Disposition and follow-up:   [name] was discharged from [facility] in Stable condition.  At the hospital follow up visit please address:  1.  Refer for elective cholecystectomy, resolution of diarrhea, discuss anticoagulation for atrial fibrillation, consider referral to cardiology.  2.  Labs / imaging needed at time of follow-up: None.  3.  Pending labs/ test needing follow-up: None.  Follow-up Appointments:     Follow-up Information    Follow up with [facility].   Specialty:  Cardiology   Why:  To follow up your atrial fibrillation.   Contact information:   [address] [id number] mc [place] [address] [phone]      Follow up with Darden Palmer, MD.   Specialty:  Internal Medicine   Why:  Hospital follow up.   Contact information:   [address] [place] [address] [phone]       Follow up with [facility].   Why:  home  health physical and occupational therapy, nurse, aide, and social worker   Contact information:   [address] [place] [address] [phone]       Discharge Instructions:  Thank you for allowing [place] to be involved in your healthcare while you were hospitalized at [facility].   Please note that there have been changes to your home medications.  --> PLEASE LOOK AT YOUR DISCHARGE MEDICATION LIST FOR DETAILS.   Please call your PCP if you have any questions or concerns, or any difficulty getting any of your medications.  Please return to the ER if you have worsening of your symptoms or new severe symptoms arise.  Your pancreatitis was most likely due to the gallstones in your gallbladder.  You will need to have your gallbladder removed to prevent you from getting pancreatitis  again. Your primary care doctor will refer you to a surgeon to get this done.  We started you on aspirin to prevent your risk of a blood clot due to your atrial fibrillation. Please discuss with your primary care doctor about the benefits and risks of starting an anticoagulant.  You have completed treatment for your urinary tract infection.  If you continue to have diarrhea, it is most likely due to the antibiotics you received. You can pick up an over-the-counter probiotic (like Culturelle or Danactive) to help with your diarrhea.  Cut back your Lasix to 20 mg only when you have leg swelling. Discharge Instructions    Call MD for:  difficulty breathing, headache or visual disturbances    Complete by:  As directed      Call MD for:  extreme fatigue    Complete by:  As directed      Call MD for:  persistant dizziness or light-headedness    Complete by:  As directed      Call MD for:  persistant nausea and vomiting    Complete by:  As directed      Call MD for:  severe uncontrolled pain    Complete by:  As directed      Call MD for:  temperature >100.4    Complete by:  As directed      Diet - low sodium heart healthy    Complete by:  As directed      Increase activity slowly    Complete by:  As directed            Consultations: None.  Procedures Performed:  Dg Chest 2 View  06/20/2014   CLINICAL DATA:  Atrial fibrillation  EXAM: CHEST  2 VIEW  COMPARISON:  05/31/2012  FINDINGS: Normal heart size and mediastinal contours. Chronic pulmonary hyperinflation. No acute infiltrate or edema. No effusion or pneumothorax. No acute osseous findings.  IMPRESSION: 1. No change to suggest acute cardiopulmonary disease. 2. Chronic hyperinflation.   Electronically Signed   By: Marnee Spring M.D.   On: 06/20/2014 15:40   Ct Abdomen Pelvis W Contrast  06/20/2014   CLINICAL DATA:  Periumbilical abdominal pain.  EXAM: CT ABDOMEN AND PELVIS WITH CONTRAST  TECHNIQUE: Multidetector CT imaging of the  abdomen and pelvis was performed using the standard protocol following bolus administration of intravenous contrast.  CONTRAST:  OMNIPAQUE IOHEXOL 300 MG/ML  SOLN  COMPARISON:  04/28/2011  FINDINGS: BODY WALL: Marked pelvic floor laxity  LOWER CHEST: Unremarkable.  ABDOMEN/PELVIS:  Liver: Subcapsular cyst in the left liver measuring 16 mm.  Biliary: Cholelithiasis without evidence of acute cholecystitis or biliary  obstruction.  Pancreas: Expansion of the tail and neighboring body with edema in the retroperitoneal upper abdominal ligaments. There is no necrosis or focal fluid collection. No acute vascular findings.  Spleen: Unremarkable.  Adrenals: Unremarkable.  Kidneys and ureters: No hydronephrosis or asymmetric renal enhancement. Study of limited utility for detecting stone given excretory phase imaging.  Bladder: Limited evaluation given collapsed state.  Reproductive: Hysterectomy and oophorectomies  Bowel: Colonic diverticulosis, especially in the sigmoid colon. The appendix is distended, but completely fills with oral contrast and is not thick walled.  Retroperitoneum: No mass or adenopathy.  Peritoneum: No ascites or pneumoperitoneum.  Vascular: No acute abnormality.  OSSEOUS: Remote T9 compression fracture with moderate height loss.  IMPRESSION: 1. Acute pancreatitis without fluid collection or necrosis. 2. Cholelithiasis. 3. Colonic diverticulosis.   Electronically Signed   By: Marnee SpringJonathon  Watts M.D.   On: 06/20/2014 15:03   [place] Abdomen Limited Ruq  06/20/2014   CLINICAL DATA:  Abdominal pain.  Acute pancreatitis.  EXAM: US ABDOMEN LIMITED - RIGHT UPPER QUADRANT  COMPARISON:  CT abdomen and pelvis earlier this same day.  FINDINGS: Gallbladder:  Multiple small stones are identified measuring up to 0.8 cm. There is no gallbladder wall thickening, pericholecystic fluid or sonographic Murphy's sign.  Common bile duct:  Diameter: 0.5 cm  Liver:  No focal lesion identified. Within normal limits in  parenchymal echogenicity.  IMPRESSION: Multiple small gallstones. Negative for cholecystitis or other acute abnormality.   Electronically Signed   By: Drusilla Kannerhomas  Dalessio M.D.   On: 06/20/2014 21:46    2D Echo:  Study Conclusions  - Left ventricle: The cavity size was normal. Wall thickness was increased in a pattern of mild LVH. The estimated ejection fraction was 60%. Wall motion was normal; there were no regional wall motion abnormalities. - Right ventricle: The cavity size was normal. Systolic function was normal.  Admission HPI:  [name] is a 73yo woman with PMHx of HTN, hypothyroidism, and osteoarthritis who presents to the ED with nausea, vomiting, and abdominal pain. Patient reports about 1 week ago she had "food poisoning" with nausea and vomiting. She states she started to have abdominal pain about 2 days later. She describes her abdominal pain as 10/10 in severity, mostly left-sided, radiating to her back, and improves when she leans forward. She states she continued to have nausea and vomiting over the past week and has been unable to eat anything for the past 4 days. She denies any fever or chills, chest pain, diarrhea, dark or bloody stools, or difficulty swallowing. Patient denies alcohol use.   While in the ED, the patient was found to be in new onset AFib with RVR with HR in 170-180s. She was started on Cardizem and then converted back to NSR. Patient reports she has been told of having a "slow heart rate" in the past, but denies any arrhythmias including AFib. Her lipase was found to be 60. CT Abd/Pelvis showed evidence of acute pancreatitis without fluid collection or necrosis as well as cholelithiasis. Additionally, her UA was noted to have many bacteria, 3-6 WBCs, +nitrites, and small leukocytes. Patient reports frequency, but denies dysuria, hematuria, and flank pain.   Hospital Course by problem list: Principal Problem:   Acute pancreatitis Active Problems:    Hyperlipidemia   Morbid obesity with BMI of 45.0-49.9, adult   Depression with anxiety   Essential hypertension   GERD   Edema of both legs   UTI (urinary tract infection)   Elevated alanine aminotransferase (  ALT) level   Cholelithiasis   Diverticulosis   #Acute gallstone pancreatitis She presented with abdominal pain radiating to the back associated with nausea and vomiting. Her lipase was noted to be mildly elevated, and she was noted to have pancreatitis on abdominal CT. Gallstones were noted on CT and right upper quadrant ultrasound without dilation of the bile duct. Her liver function tests were initially elevated but returned to normal on her second day of hospitalization. In addition, she reported colicky abdominal pain radiating to the back intermittently over the past several months consistent with gallstone disease. It was thought that she had developed an episode of pancreatitis secondary to a gallstone, which had passed on its own. She was kept nothing by mouth and given IV fluids and pain medication with resolution of her symptoms. Her diet was advanced as tolerated, and she was able to eat a normal diet without issue prior to discharge. PT and OT were consulted and recommended discharge to a skilled nursing facility. However, she refused and asked to be sent home under the care of her son. At follow-up, please refer her to general surgery for elective cholecystectomy.  #Paroxysmal atrial fibrillation She complained of palpitations while in the emergency room and was found to be in new onset atrial fibrillation with RVR to the 170 to 180s. She was given IV Cardizem, and she converted back to normal sinus rhythm. She remained in normal sinus rhythm for the rest the hospitalization. She did report previous episodes of palpitations associated with stress, concerning for paroxysmal atrial fibrillation. Echocardiogram was unremarkable. Her CHADS2-VASC was calculated to be 4, and she would  benefit from anticoagulation. However, she does have frequent falls at home. It was recommended to the patient that she be started on a NOAC, but she did not want to start this medication currently. She agreed to take aspirin 81 mg daily, and this was continued at discharge. At follow-up, continue to discuss the benefits and risks of anticoagulation. Consider a referral to cardiology for their input and/or possible loop monitoring to confirm paroxysmal atrial fibrillation.  #UTI She reported some increased frequency of urination on presentation without dysuria. Her urinalysis was consistent with UTI so she was started on IV ceftriaxone, which was continued for 3 days. She completed antibiotic treatment during the hospitalization. On the third day of treatment, she developed a couple of episodes of loose stools that were thought to be related to antibiotic use. She was told to pick up an over-the-counter probiotic if her diarrhea continued on discharge. At follow-up, make sure her diarrhea has resolved.  #Bilateral leg pain She complained of bilateral leg pain, so lower extremity Dopplers were performed and were negative. Her pain is most likely related to her chronic arthritis, and her home tramadol 50 mg daily when necessary was continued during hospitalization.  #Hypertension Her home lisinopril 10 mg daily and Lasix 20 mg every other day were held initially. Her lisinopril was restarted when she began eating. She remained normotensive off of her home Lasix. She does not have a history of heart failure, so the Lasix appeared to be prescribed for minimal lower extremity edema and blood pressure. She was told to only take her Lasix 20 mg only when needed for increased lower extremity swelling.  #Hypothyroidism She has a history of hypothyroidism and has taken Synthroid in the past. However, she reports not taking Synthroid currently. Her TSH was checked and was normal, so she was told to continue not  taking Synthroid at  discharge.  Discharge Vitals:   BP 133/56 mmHg  Pulse 78  Temp(Src) 98.3 F (36.8 C) (Oral)  Resp 18  Ht 4\' 11"  (1.499 m)  Wt 214 lb 8.1 oz (97.3 kg)  BMI 43.30 kg/m2  SpO2 100%  Discharge Labs:  Results for orders placed or performed during the hospital encounter of 06/20/14 (from the past 24 hour(s))  Troponin I     Status: None   Collection Time: 06/21/14 11:17 PM  Result Value Ref Range   Troponin I 0.03 <0.031 ng/mL  Troponin I     Status: Abnormal   Collection Time: 06/22/14  4:58 AM  Result Value Ref Range   Troponin I 0.04 (H) <0.031 ng/mL    Signed: Luisa Dago, MD 06/22/2014, 6:58 PM    Services Ordered on Discharge: Home health PT, OT, RN. Equipment Ordered on Discharge: None.

## 2014-06-22 NOTE — Progress Notes (Signed)
Pt. dc'ed to  Home with son, he the son was upset and had concerns about her care at home he spoke to the doctor and after given the chose of home or nursing home he chose to take his mother home.

## 2014-06-22 NOTE — Progress Notes (Signed)
Subjective:    She reports continuing improvement of her abdominal pain, nausea, and vomiting. She had a couple of loose bowel movements last night, but that has resolved this morning. She says she is hungry and would like to try a regular diet, and she is requesting to go home today. She is refusing discharged to a skilled nursing facility. We discussed the benefits of anticoagulation to prevent formation of a blood clot due to her atrial fibrillation, but she does not wish to start anticoagulation currently. She is agreeable to start aspirin though. She reports improvement of her urinary symptoms.  Interval Events: -Episode of confusion last night that has resolved this morning. -Episode of sinus tachycardia last night to resolve on its own. -Troponin trended up to 0.21 yesterday afternoon then back down overnight. -EKG with no acute changes. -Tolerated clear liquid diet without issue yesterday.    Objective:    Vital Signs:   Temp:  [98.1 F (36.7 C)-98.6 F (37 C)] 98.3 F (36.8 C) (02/19 2041) Pulse Rate:  [69-82] 78 (02/19 2041) Resp:  [16-18] 18 (02/19 2041) BP: (121-134)/(55-67) 133/56 mmHg (02/19 2041) SpO2:  [94 %-100 %] 100 % (02/19 2041) Weight:  [214 lb 8.1 oz (97.3 kg)] 214 lb 8.1 oz (97.3 kg) (02/20 0446) Last BM Date: 06/21/14  24-hour weight change: Weight change: -1 lb 8.9 oz (-0.705 kg)  Intake/Output:   Intake/Output Summary (Last 24 hours) at 06/22/14 1010 Last data filed at 06/21/14 1713  Gross per 24 hour  Intake    696 ml  Output      0 ml  Net    696 ml      Physical Exam: General: Well-developed, well-nourished, in no acute distress; alert, appropriate and cooperative throughout examination.  Lungs:  Normal respiratory effort. Clear to auscultation BL without crackles or wheezes.  Heart: RRR. S1 and S2 normal without gallop, murmur, or rubs.  Abdomen:  BS normoactive. Soft, Nondistended, non-tender.  No masses or organomegaly.  Extremities:  No pretibial edema.     Labs:  Basic Metabolic Panel:  Recent Labs Lab 06/20/14 1140 06/20/14 1900 06/21/14 0530  NA 134*  --  135  K 3.8  --  3.4*  CL 103  --  103  CO2 21  --  25  GLUCOSE 93  --  79  BUN 22  --  12  CREATININE 0.99  --  0.84  CALCIUM 9.0  --  8.3*  MG  --  2.0  --   PHOS  --  3.0  --     Liver Function Tests:  Recent Labs Lab 06/20/14 1140 06/20/14 1900 06/21/14 0530  AST 21  --  17  ALT 42*  --  28  ALKPHOS 97  --  84  BILITOT 1.7* 1.1 1.5*  PROT 7.0  --  5.9*  ALBUMIN 3.1*  --  2.5*    Recent Labs Lab 06/20/14 1140  LIPASE 60*   CBC:  Recent Labs Lab 06/20/14 1140 06/21/14 0530  WBC 16.0* 13.2*  NEUTROABS 12.2*  --   HGB 13.8 11.5*  HCT 42.5 36.5  MCV 90.0 90.1  PLT 211 204    Cardiac Enzymes:  Recent Labs Lab 06/20/14 1900 06/21/14 1121 06/21/14 1640 06/21/14 2317 06/22/14 0458  CKTOTAL 85  --   --   --   --   TROPONINI  --  0.04* 0.21* 0.03 0.04*   Microbiology: Results for orders placed or performed during the hospital encounter  of 06/20/14  Urine culture     Status: None   Collection Time: 06/20/14  2:19 PM  Result Value Ref Range Status   Specimen Description URINE, RANDOM  Final   Special Requests ADDED [id number]  Final   Colony Count   Final    >=100,000 COLONIES/ML Performed at [facility]    Culture   Final    Multiple bacterial morphotypes present, none predominant. Suggest appropriate recollection if clinically indicated. Performed at [facility]    Report Status 06/21/2014 FINAL  Final  MRSA PCR Screening     Status: Abnormal   Collection Time: 06/20/14  6:40 PM  Result Value Ref Range Status   MRSA by PCR POSITIVE (A) NEGATIVE Final    Comment:        The GeneXpert MRSA Assay (FDA approved for NASAL specimens only), is one component of a comprehensive MRSA colonization surveillance program. It is not intended to diagnose MRSA infection nor to guide or monitor  treatment for MRSA infections. RESULT CALLED TO, READ BACK BY AND VERIFIED WITH: H.MCGRATH,RN AT 0057 BY L.PITT 06/21/14     Coagulation Studies:  Recent Labs  06/20/14 1900  LABPROT 15.1  INR 1.18    Imaging: Dg Chest 2 View  06/20/2014   CLINICAL DATA:  Atrial fibrillation  EXAM: CHEST  2 VIEW  COMPARISON:  05/31/2012  FINDINGS: Normal heart size and mediastinal contours. Chronic pulmonary hyperinflation. No acute infiltrate or edema. No effusion or pneumothorax. No acute osseous findings.  IMPRESSION: 1. No change to suggest acute cardiopulmonary disease. 2. Chronic hyperinflation.   Electronically Signed   By: Marnee SpringJonathon  Watts M.D.   On: 06/20/2014 15:40   Ct Abdomen Pelvis W Contrast  06/20/2014   CLINICAL DATA:  Periumbilical abdominal pain.  EXAM: CT ABDOMEN AND PELVIS WITH CONTRAST  TECHNIQUE: Multidetector CT imaging of the abdomen and pelvis was performed using the standard protocol following bolus administration of intravenous contrast.  CONTRAST:  100mL OMNIPAQUE IOHEXOL 300 MG/ML  SOLN  COMPARISON:  04/28/2011  FINDINGS: BODY WALL: Marked pelvic floor laxity  LOWER CHEST: Unremarkable.  ABDOMEN/PELVIS:  Liver: Subcapsular cyst in the left liver measuring 16 mm.  Biliary: Cholelithiasis without evidence of acute cholecystitis or biliary obstruction.  Pancreas: Expansion of the tail and neighboring body with edema in the retroperitoneal upper abdominal ligaments. There is no necrosis or focal fluid collection. No acute vascular findings.  Spleen: Unremarkable.  Adrenals: Unremarkable.  Kidneys and ureters: No hydronephrosis or asymmetric renal enhancement. Study of limited utility for detecting stone given excretory phase imaging.  Bladder: Limited evaluation given collapsed state.  Reproductive: Hysterectomy and oophorectomies  Bowel: Colonic diverticulosis, especially in the sigmoid colon. The appendix is distended, but completely fills with oral contrast and is not thick walled.   Retroperitoneum: No mass or adenopathy.  Peritoneum: No ascites or pneumoperitoneum.  Vascular: No acute abnormality.  OSSEOUS: Remote T9 compression fracture with moderate height loss.  IMPRESSION: 1. Acute pancreatitis without fluid collection or necrosis. 2. Cholelithiasis. 3. Colonic diverticulosis.   Electronically Signed   By: Marnee SpringJonathon  Watts M.D.   On: 06/20/2014 15:03   [place] Abdomen Limited Ruq  06/20/2014   CLINICAL DATA:  Abdominal pain.  Acute pancreatitis.  EXAM: US ABDOMEN LIMITED - RIGHT UPPER QUADRANT  COMPARISON:  CT abdomen and pelvis earlier this same day.  FINDINGS: Gallbladder:  Multiple small stones are identified measuring up to 0.8 cm. There is no gallbladder wall thickening, pericholecystic fluid or sonographic  Murphy's sign.  Common bile duct:  Diameter: 0.5 cm  Liver:  No focal lesion identified. Within normal limits in parenchymal echogenicity.  IMPRESSION: Multiple small gallstones. Negative for cholecystitis or other acute abnormality.   Electronically Signed   By: Drusilla Kanner M.D.   On: 06/20/2014 21:46    EKG: Normal sinus rhythm, unchanged from prior.    Medications:    Infusions:    Scheduled Medications: . aspirin EC  81 mg Oral Daily  . cefTRIAXone (ROCEPHIN)  IV  1 g Intravenous Q24H  . enoxaparin (LOVENOX) injection  40 mg Subcutaneous Q24H  . Influenza vac split quadrivalent PF  0.5 mL Intramuscular Tomorrow-1000  . lisinopril  10 mg Oral Daily  . nystatin   Topical BID  . nystatin cream   Topical BID  . pantoprazole  40 mg Oral Daily  . sertraline  25 mg Oral Daily  . simvastatin  10 mg Oral QHS  . sodium chloride  3 mL Intravenous Q12H    PRN Medications: morphine injection, ondansetron (ZOFRAN) IV, traMADol   Assessment/ Plan:    Principal Problem:   Acute pancreatitis Active Problems:   Hyperlipidemia   Morbid obesity with BMI of 45.0-49.9, adult   Depression with anxiety   Essential hypertension   GERD   Edema of both legs   UTI  (urinary tract infection)   Elevated alanine aminotransferase (ALT) level   Cholelithiasis   Diverticulosis  #Acute gallstone pancreatitis No symptoms currently. Tolerated clear liquid diet without issues. Triglycerides normal on lipid panel. Refusing skilled nursing facility, so she will return home with her son with the help of home health. -Advance to regular diet. -Zofran when necessary for nausea. -Stop morphine. -Referral as outpatient for elective cholecystectomy. -Ordered home health PT and OT.  #New onset A. fib with RVR She has paroxysmal atrial fibrillation that is likely to recur. She's had episodes of palpitations in the past. Given her CHADS2-VASC of 4, she would benefit from anticoagulation. However, she does have a history of multiple falls, and after discussing with the patient, she does not want to start anticoagulation currently. She is agreeable to start aspirin 81 mg daily and discuss further with her PCP. No significant findings on echocardiogram. Troponin trending down, likely elevated due to RVR. -Start aspirin 81 mg daily. -Follow-up with PCP with possible outpatient referral to cardiology. -No rate control needed currently. -Continue telemetry.  #Antibiotic associated diarrhea Likely related to ceftriaxone from UTI treatment. No diarrhea this morning. She been put on enteric precautions and C. difficile PCR was ordered by nursing, but we'll discontinue these orders. -We'll instruct patient to get an over-the-counter probiotic if she has any loose stools as an outpatient.  #UTI Today is her third day of ceftriaxone, which completes treatment for a complicated UTI. Given her diarrhea we will stop antibiotics today. Multiple types of bacteria on urine culture. -Last day of Rocephin IV.  #Bilateral leg pain Lower extremity Dopplers negative, pain most likely due to arthritis. -Restart home tramadol 50 mg daily as needed.  #Hypertension Normotensive this  morning. -Continue to hold home Lasix 20 mg every other day with no evidence of edema. We'll restart when necessary at discharge. -Continue home lisinopril 10 mg daily.  #Hyperlipidemia -Continue home simvastatin.  #GERD -Continue home Protonix.  #Depression -Continue home Zoloft.   DVT PPX - low molecular weight heparin  CODE STATUS - Full.  CONSULTS PLACED - None.  DISPO - discharge home this afternoon if she tolerates a  full diet.  The patient does have a current PCP Darden Palmer, MD) and does need an [facility] hospital follow-up appointment after discharge.    Is the [facility] hospital follow-up appointment a one-time only appointment? no.  Does the patient have transportation limitations that hinder transportation to clinic appointments? yes   SERVICE NEEDED AT DISCHARGE - TO BE DETERMINED DURING HOSPITAL COURSE         Y = Yes, Blank = No PT: SNF  OT: SNF  RN:   Equipment:   Other:      Length of Stay: 2 day(s)   Signed: Luisa Dago, MD  PGY-1, Internal Medicine Resident Pager: [phone] (7AM-5PM) 06/22/2014, 10:10 AM

## 2014-06-22 NOTE — Progress Notes (Signed)
Pt reported seeing spiders crawling on the wall and on herself. Pt bedding changed and call bell within reach. Will continue to monitor.   Edgardo RoysMcGrath, Cris Talavera R

## 2014-06-22 NOTE — Discharge Instructions (Addendum)
·   Thank you for allowing us to be involved in your healthcare while [facility] were hospitalized [facility].   Please note that there have been changes to your home medications.  --> PLEASE LOOK AT YOUR DISCHARGE MEDICATION LIST FOR DETAILS.   Please call your PCP if you have any questions or concerns, or any difficulty getting any of your medications.  Please return to the ER if you have worsening of your symptoms or new severe symptoms arise.  Your pancreatitis was most likely due to the gallstones in your gallbladder.  You will need to have your gallbladder removed to prevent you from getting pancreatitis again. Your primary care doctor will refer you to a surgeon to get this done.  We started you on aspirin to prevent your risk of a blood clot due to your atrial fibrillation. Please discuss with your primary care doctor about the benefits and risks of starting an anticoagulant.  You have completed treatment for your urinary tract infection.  If you continue to have diarrhea, it is most likely due to the antibiotics you received. You can pick up an over-the-counter probiotic (like Culturelle or Danactive) to help with your diarrhea.  Cut back your Lasix to 20 mg only when you have leg swelling.

## 2014-06-23 LAB — HIV ANTIBODY (ROUTINE TESTING W REFLEX): HIV Screen 4th Generation wRfx: NONREACTIVE

## 2014-06-24 NOTE — Telephone Encounter (Signed)
This is a prn medication. I do not want to give a 90 day supply. She was just discharged from the hospital.

## 2014-06-25 ENCOUNTER — Other Ambulatory Visit: Payer: Self-pay | Admitting: Internal Medicine

## 2014-06-25 DIAGNOSIS — I480 Paroxysmal atrial fibrillation: Secondary | ICD-10-CM | POA: Diagnosis not present

## 2014-06-25 DIAGNOSIS — F0390 Unspecified dementia without behavioral disturbance: Secondary | ICD-10-CM | POA: Diagnosis not present

## 2014-06-25 DIAGNOSIS — K8020 Calculus of gallbladder without cholecystitis without obstruction: Secondary | ICD-10-CM | POA: Diagnosis not present

## 2014-06-25 DIAGNOSIS — M1990 Unspecified osteoarthritis, unspecified site: Secondary | ICD-10-CM | POA: Diagnosis not present

## 2014-06-25 DIAGNOSIS — M810 Age-related osteoporosis without current pathological fracture: Secondary | ICD-10-CM | POA: Diagnosis not present

## 2014-06-25 DIAGNOSIS — K5790 Diverticulosis of intestine, part unspecified, without perforation or abscess without bleeding: Secondary | ICD-10-CM | POA: Diagnosis not present

## 2014-06-25 DIAGNOSIS — K859 Acute pancreatitis, unspecified: Secondary | ICD-10-CM | POA: Diagnosis not present

## 2014-06-25 DIAGNOSIS — I10 Essential (primary) hypertension: Secondary | ICD-10-CM | POA: Diagnosis not present

## 2014-06-25 DIAGNOSIS — F329 Major depressive disorder, single episode, unspecified: Secondary | ICD-10-CM | POA: Diagnosis not present

## 2014-06-25 NOTE — Telephone Encounter (Signed)
Is she still taking this?

## 2014-06-27 NOTE — Telephone Encounter (Signed)
Hi helen,  Is she still on this?? The last one filled was a long time ago.   Thanks,  Dr q

## 2014-07-01 ENCOUNTER — Other Ambulatory Visit: Payer: Self-pay | Admitting: *Deleted

## 2014-07-01 DIAGNOSIS — K8020 Calculus of gallbladder without cholecystitis without obstruction: Secondary | ICD-10-CM | POA: Diagnosis not present

## 2014-07-01 DIAGNOSIS — I480 Paroxysmal atrial fibrillation: Secondary | ICD-10-CM | POA: Diagnosis not present

## 2014-07-01 DIAGNOSIS — F0390 Unspecified dementia without behavioral disturbance: Secondary | ICD-10-CM | POA: Diagnosis not present

## 2014-07-01 DIAGNOSIS — K859 Acute pancreatitis, unspecified: Secondary | ICD-10-CM | POA: Diagnosis not present

## 2014-07-01 DIAGNOSIS — I10 Essential (primary) hypertension: Secondary | ICD-10-CM | POA: Diagnosis not present

## 2014-07-01 DIAGNOSIS — K5790 Diverticulosis of intestine, part unspecified, without perforation or abscess without bleeding: Secondary | ICD-10-CM | POA: Diagnosis not present

## 2014-07-01 NOTE — Telephone Encounter (Signed)
Talked with pt request daughter in law - pt takes fluid pill every other day. Pt took fluid pill 06/30/24 - freq urination. Today back to normal output. Reassured - be glad to see pt any time. Pt states doing well. Stanton KidneyDebra Malakai Schoenherr RN 07/01/14 3PM

## 2014-07-02 DIAGNOSIS — K5790 Diverticulosis of intestine, part unspecified, without perforation or abscess without bleeding: Secondary | ICD-10-CM | POA: Diagnosis not present

## 2014-07-02 DIAGNOSIS — I10 Essential (primary) hypertension: Secondary | ICD-10-CM | POA: Diagnosis not present

## 2014-07-02 DIAGNOSIS — K8020 Calculus of gallbladder without cholecystitis without obstruction: Secondary | ICD-10-CM | POA: Diagnosis not present

## 2014-07-02 DIAGNOSIS — F0390 Unspecified dementia without behavioral disturbance: Secondary | ICD-10-CM | POA: Diagnosis not present

## 2014-07-02 DIAGNOSIS — I480 Paroxysmal atrial fibrillation: Secondary | ICD-10-CM | POA: Diagnosis not present

## 2014-07-02 DIAGNOSIS — K859 Acute pancreatitis, unspecified: Secondary | ICD-10-CM | POA: Diagnosis not present

## 2014-07-03 DIAGNOSIS — F0390 Unspecified dementia without behavioral disturbance: Secondary | ICD-10-CM | POA: Diagnosis not present

## 2014-07-03 DIAGNOSIS — I10 Essential (primary) hypertension: Secondary | ICD-10-CM | POA: Diagnosis not present

## 2014-07-03 DIAGNOSIS — K859 Acute pancreatitis, unspecified: Secondary | ICD-10-CM | POA: Diagnosis not present

## 2014-07-03 DIAGNOSIS — K8020 Calculus of gallbladder without cholecystitis without obstruction: Secondary | ICD-10-CM | POA: Diagnosis not present

## 2014-07-03 DIAGNOSIS — I480 Paroxysmal atrial fibrillation: Secondary | ICD-10-CM | POA: Diagnosis not present

## 2014-07-03 DIAGNOSIS — K5790 Diverticulosis of intestine, part unspecified, without perforation or abscess without bleeding: Secondary | ICD-10-CM | POA: Diagnosis not present

## 2014-07-04 DIAGNOSIS — K8020 Calculus of gallbladder without cholecystitis without obstruction: Secondary | ICD-10-CM | POA: Diagnosis not present

## 2014-07-04 DIAGNOSIS — K859 Acute pancreatitis, unspecified: Secondary | ICD-10-CM | POA: Diagnosis not present

## 2014-07-04 DIAGNOSIS — I10 Essential (primary) hypertension: Secondary | ICD-10-CM | POA: Diagnosis not present

## 2014-07-04 DIAGNOSIS — I480 Paroxysmal atrial fibrillation: Secondary | ICD-10-CM | POA: Diagnosis not present

## 2014-07-04 DIAGNOSIS — F0390 Unspecified dementia without behavioral disturbance: Secondary | ICD-10-CM | POA: Diagnosis not present

## 2014-07-04 DIAGNOSIS — K5790 Diverticulosis of intestine, part unspecified, without perforation or abscess without bleeding: Secondary | ICD-10-CM | POA: Diagnosis not present

## 2014-07-09 DIAGNOSIS — K5790 Diverticulosis of intestine, part unspecified, without perforation or abscess without bleeding: Secondary | ICD-10-CM | POA: Diagnosis not present

## 2014-07-09 DIAGNOSIS — I10 Essential (primary) hypertension: Secondary | ICD-10-CM | POA: Diagnosis not present

## 2014-07-09 DIAGNOSIS — I480 Paroxysmal atrial fibrillation: Secondary | ICD-10-CM | POA: Diagnosis not present

## 2014-07-09 DIAGNOSIS — K8020 Calculus of gallbladder without cholecystitis without obstruction: Secondary | ICD-10-CM | POA: Diagnosis not present

## 2014-07-09 DIAGNOSIS — K859 Acute pancreatitis, unspecified: Secondary | ICD-10-CM | POA: Diagnosis not present

## 2014-07-09 DIAGNOSIS — F0390 Unspecified dementia without behavioral disturbance: Secondary | ICD-10-CM | POA: Diagnosis not present

## 2014-07-09 NOTE — Telephone Encounter (Signed)
She states she does not want to take this anymore because "stuff like that makes me crazy"

## 2014-07-10 NOTE — Telephone Encounter (Signed)
That's what i figured. Can we touch base with pharmacy to make sure the prescription is canceled.   Thank you,  Dr q

## 2014-07-11 DIAGNOSIS — F0390 Unspecified dementia without behavioral disturbance: Secondary | ICD-10-CM | POA: Diagnosis not present

## 2014-07-11 DIAGNOSIS — I10 Essential (primary) hypertension: Secondary | ICD-10-CM | POA: Diagnosis not present

## 2014-07-11 DIAGNOSIS — K5790 Diverticulosis of intestine, part unspecified, without perforation or abscess without bleeding: Secondary | ICD-10-CM | POA: Diagnosis not present

## 2014-07-11 DIAGNOSIS — I480 Paroxysmal atrial fibrillation: Secondary | ICD-10-CM | POA: Diagnosis not present

## 2014-07-11 DIAGNOSIS — K8020 Calculus of gallbladder without cholecystitis without obstruction: Secondary | ICD-10-CM | POA: Diagnosis not present

## 2014-07-11 DIAGNOSIS — K859 Acute pancreatitis, unspecified: Secondary | ICD-10-CM | POA: Diagnosis not present

## 2014-07-16 ENCOUNTER — Other Ambulatory Visit: Payer: Self-pay | Admitting: Internal Medicine

## 2014-07-16 DIAGNOSIS — I10 Essential (primary) hypertension: Secondary | ICD-10-CM | POA: Diagnosis not present

## 2014-07-16 DIAGNOSIS — I480 Paroxysmal atrial fibrillation: Secondary | ICD-10-CM | POA: Diagnosis not present

## 2014-07-16 DIAGNOSIS — F0390 Unspecified dementia without behavioral disturbance: Secondary | ICD-10-CM | POA: Diagnosis not present

## 2014-07-16 DIAGNOSIS — K8020 Calculus of gallbladder without cholecystitis without obstruction: Secondary | ICD-10-CM | POA: Diagnosis not present

## 2014-07-16 DIAGNOSIS — K5790 Diverticulosis of intestine, part unspecified, without perforation or abscess without bleeding: Secondary | ICD-10-CM | POA: Diagnosis not present

## 2014-07-16 DIAGNOSIS — K859 Acute pancreatitis, unspecified: Secondary | ICD-10-CM | POA: Diagnosis not present

## 2014-07-18 DIAGNOSIS — I10 Essential (primary) hypertension: Secondary | ICD-10-CM | POA: Diagnosis not present

## 2014-07-18 DIAGNOSIS — F0390 Unspecified dementia without behavioral disturbance: Secondary | ICD-10-CM | POA: Diagnosis not present

## 2014-07-18 DIAGNOSIS — K8020 Calculus of gallbladder without cholecystitis without obstruction: Secondary | ICD-10-CM | POA: Diagnosis not present

## 2014-07-18 DIAGNOSIS — K859 Acute pancreatitis, unspecified: Secondary | ICD-10-CM | POA: Diagnosis not present

## 2014-07-18 DIAGNOSIS — I480 Paroxysmal atrial fibrillation: Secondary | ICD-10-CM | POA: Diagnosis not present

## 2014-07-18 DIAGNOSIS — K5790 Diverticulosis of intestine, part unspecified, without perforation or abscess without bleeding: Secondary | ICD-10-CM | POA: Diagnosis not present

## 2014-07-21 ENCOUNTER — Other Ambulatory Visit: Payer: Self-pay | Admitting: Internal Medicine

## 2014-07-22 NOTE — Telephone Encounter (Signed)
Hi helen,  Last i checked, she was not requiring this and was only using PRN. Has she sent this request? Or this automatic?  Thanks,  Dr q

## 2014-07-23 DIAGNOSIS — I10 Essential (primary) hypertension: Secondary | ICD-10-CM | POA: Diagnosis not present

## 2014-07-23 DIAGNOSIS — K5790 Diverticulosis of intestine, part unspecified, without perforation or abscess without bleeding: Secondary | ICD-10-CM | POA: Diagnosis not present

## 2014-07-23 DIAGNOSIS — K859 Acute pancreatitis, unspecified: Secondary | ICD-10-CM | POA: Diagnosis not present

## 2014-07-23 DIAGNOSIS — K8020 Calculus of gallbladder without cholecystitis without obstruction: Secondary | ICD-10-CM | POA: Diagnosis not present

## 2014-07-23 DIAGNOSIS — F0390 Unspecified dementia without behavioral disturbance: Secondary | ICD-10-CM | POA: Diagnosis not present

## 2014-07-23 DIAGNOSIS — I480 Paroxysmal atrial fibrillation: Secondary | ICD-10-CM | POA: Diagnosis not present

## 2014-07-23 NOTE — Telephone Encounter (Signed)
Hi Lisa Haynes,  Can we find out if [name] is still taking and needing lasix?  Thanks,  Dr q

## 2014-07-25 DIAGNOSIS — K859 Acute pancreatitis, unspecified: Secondary | ICD-10-CM | POA: Diagnosis not present

## 2014-07-25 DIAGNOSIS — K8020 Calculus of gallbladder without cholecystitis without obstruction: Secondary | ICD-10-CM | POA: Diagnosis not present

## 2014-07-25 DIAGNOSIS — I480 Paroxysmal atrial fibrillation: Secondary | ICD-10-CM | POA: Diagnosis not present

## 2014-07-25 DIAGNOSIS — K5790 Diverticulosis of intestine, part unspecified, without perforation or abscess without bleeding: Secondary | ICD-10-CM | POA: Diagnosis not present

## 2014-07-25 DIAGNOSIS — F0390 Unspecified dementia without behavioral disturbance: Secondary | ICD-10-CM | POA: Diagnosis not present

## 2014-07-25 DIAGNOSIS — I10 Essential (primary) hypertension: Secondary | ICD-10-CM | POA: Diagnosis not present

## 2014-07-30 DIAGNOSIS — I10 Essential (primary) hypertension: Secondary | ICD-10-CM | POA: Diagnosis not present

## 2014-07-30 DIAGNOSIS — K8020 Calculus of gallbladder without cholecystitis without obstruction: Secondary | ICD-10-CM | POA: Diagnosis not present

## 2014-07-30 DIAGNOSIS — F0390 Unspecified dementia without behavioral disturbance: Secondary | ICD-10-CM | POA: Diagnosis not present

## 2014-07-30 DIAGNOSIS — K859 Acute pancreatitis, unspecified: Secondary | ICD-10-CM | POA: Diagnosis not present

## 2014-07-30 DIAGNOSIS — K5790 Diverticulosis of intestine, part unspecified, without perforation or abscess without bleeding: Secondary | ICD-10-CM | POA: Diagnosis not present

## 2014-07-30 DIAGNOSIS — I480 Paroxysmal atrial fibrillation: Secondary | ICD-10-CM | POA: Diagnosis not present

## 2014-07-30 NOTE — Telephone Encounter (Signed)
Pt states she takes Lasix every other day instead of daily b/c she has to go to [place] frequently.

## 2014-07-31 ENCOUNTER — Other Ambulatory Visit: Payer: Self-pay | Admitting: Internal Medicine

## 2014-07-31 ENCOUNTER — Telehealth: Payer: Self-pay | Admitting: Internal Medicine

## 2014-07-31 MED ORDER — OMEPRAZOLE 20 MG PO CPDR: 20.0000 mg | DELAYED_RELEASE_CAPSULE | Freq: Every day | ORAL | Status: DC

## 2014-07-31 MED ORDER — OMEPRAZOLE 40 MG PO CPDR: 40.0000 mg | DELAYED_RELEASE_CAPSULE | Freq: Every day | ORAL | Status: DC

## 2014-07-31 NOTE — Telephone Encounter (Signed)
I called Ms. Augello today in regards to her lasix refill. She says she does not wish to take if it its not needed and currently taking only every other day. She says her leg swelling has improved but she is very bothered by the medicine due to her having to go to the bathroom all the time. As such, we decided to not refill any more lasix until she is seen in opc. She says if she feels like her swelling is increasing, she will call the clinic for a refill but otherwise she is happy to not take it.   She has not follow up in clinic for hospital follow up since discharge and needs to do so. She said she will try to schedule a follow up appt in 2 weeks when her son can take her and I will have our front desk try to schedule that for her.   She also denies going to afib clinic as listed on discharge summary and says no one told her.   Finally, she is requesting a refill of her prilosec which had 11 refills from last year but she says the pharmacy has not called her to say it is ready. Therefore, I will refill it for 1 month for now until she is seen for follow up.

## 2014-07-31 NOTE — Telephone Encounter (Signed)
Hi Kaye,  I only gave 30 days for now as she needs to come in for follow up appt and we need to establish if she is still needing it for this long.   Thanks,  Dr q

## 2014-07-31 NOTE — Telephone Encounter (Signed)
Message sent to front office to schedule pt an appt. 

## 2014-08-06 DIAGNOSIS — K5790 Diverticulosis of intestine, part unspecified, without perforation or abscess without bleeding: Secondary | ICD-10-CM | POA: Diagnosis not present

## 2014-08-06 DIAGNOSIS — K859 Acute pancreatitis, unspecified: Secondary | ICD-10-CM | POA: Diagnosis not present

## 2014-08-06 DIAGNOSIS — K8020 Calculus of gallbladder without cholecystitis without obstruction: Secondary | ICD-10-CM | POA: Diagnosis not present

## 2014-08-06 DIAGNOSIS — I10 Essential (primary) hypertension: Secondary | ICD-10-CM | POA: Diagnosis not present

## 2014-08-06 DIAGNOSIS — I480 Paroxysmal atrial fibrillation: Secondary | ICD-10-CM | POA: Diagnosis not present

## 2014-08-06 DIAGNOSIS — F0390 Unspecified dementia without behavioral disturbance: Secondary | ICD-10-CM | POA: Diagnosis not present

## 2014-08-13 ENCOUNTER — Ambulatory Visit: Payer: Medicare Other | Admitting: Internal Medicine

## 2014-08-16 DIAGNOSIS — K8020 Calculus of gallbladder without cholecystitis without obstruction: Secondary | ICD-10-CM | POA: Diagnosis not present

## 2014-08-16 DIAGNOSIS — K5790 Diverticulosis of intestine, part unspecified, without perforation or abscess without bleeding: Secondary | ICD-10-CM | POA: Diagnosis not present

## 2014-08-16 DIAGNOSIS — I480 Paroxysmal atrial fibrillation: Secondary | ICD-10-CM | POA: Diagnosis not present

## 2014-08-16 DIAGNOSIS — K859 Acute pancreatitis, unspecified: Secondary | ICD-10-CM | POA: Diagnosis not present

## 2014-08-16 DIAGNOSIS — F0390 Unspecified dementia without behavioral disturbance: Secondary | ICD-10-CM | POA: Diagnosis not present

## 2014-08-16 DIAGNOSIS — I10 Essential (primary) hypertension: Secondary | ICD-10-CM | POA: Diagnosis not present

## 2014-08-19 ENCOUNTER — Telehealth: Payer: Self-pay | Admitting: Internal Medicine

## 2014-08-19 NOTE — Telephone Encounter (Signed)
Call to patient to confirm appointment for 08/20/14 at 10:45. lmtcb

## 2014-08-20 ENCOUNTER — Encounter: Payer: Self-pay | Admitting: Internal Medicine

## 2014-08-20 ENCOUNTER — Ambulatory Visit (INDEPENDENT_AMBULATORY_CARE_PROVIDER_SITE_OTHER): Payer: Medicare Other | Admitting: Internal Medicine

## 2014-08-20 VITALS — BP 151/62 | HR 68 | Temp 98.2°F | Ht 60.0 in | Wt 216.8 lb

## 2014-08-20 DIAGNOSIS — R5381 Other malaise: Secondary | ICD-10-CM | POA: Diagnosis not present

## 2014-08-20 DIAGNOSIS — K8020 Calculus of gallbladder without cholecystitis without obstruction: Secondary | ICD-10-CM

## 2014-08-20 DIAGNOSIS — F418 Other specified anxiety disorders: Secondary | ICD-10-CM

## 2014-08-20 DIAGNOSIS — R600 Localized edema: Secondary | ICD-10-CM

## 2014-08-20 DIAGNOSIS — Z79899 Other long term (current) drug therapy: Secondary | ICD-10-CM

## 2014-08-20 DIAGNOSIS — I10 Essential (primary) hypertension: Secondary | ICD-10-CM

## 2014-08-20 DIAGNOSIS — I480 Paroxysmal atrial fibrillation: Secondary | ICD-10-CM | POA: Diagnosis present

## 2014-08-20 HISTORY — DX: Paroxysmal atrial fibrillation: I48.0

## 2014-08-20 LAB — BASIC METABOLIC PANEL WITH GFR
BUN: 16 mg/dL (ref 6–23)
CALCIUM: 9.3 mg/dL (ref 8.4–10.5)
CO2: 25 mEq/L (ref 19–32)
CREATININE: 0.77 mg/dL (ref 0.50–1.10)
Chloride: 106 mEq/L (ref 96–112)
GFR, Est African American: 87 mL/min
GFR, Est Non African American: 75 mL/min
GLUCOSE: 87 mg/dL (ref 70–99)
Potassium: 4 mEq/L (ref 3.5–5.3)
SODIUM: 140 meq/L (ref 135–145)

## 2014-08-20 MED ORDER — APIXABAN 5 MG PO TABS: 5.0000 mg | ORAL_TABLET | Freq: Two times a day (BID) | ORAL | Status: DC

## 2014-08-20 MED ORDER — SERTRALINE HCL 50 MG PO TABS: 50.0000 mg | ORAL_TABLET | Freq: Every day | ORAL | Status: DC

## 2014-08-20 MED ORDER — SERTRALINE HCL 50 MG PO TABS: 25.0000 mg | ORAL_TABLET | Freq: Every day | ORAL | Status: DC

## 2014-08-20 NOTE — Assessment & Plan Note (Signed)
BP 151/62 Plan -Continue with lisinopril 10 mg daily. -Follow-up routinely

## 2014-08-20 NOTE — Patient Instructions (Addendum)
General Instructions: Please start taking Eliquis Please stop taking aspirin once you start taking Eliquis (the new blood thinner) Please increase Sertraline from 25 mg daily to 50 mg daily.  I will refer you to heart doctor and general surgery  Please come back in 1 month.   Please bring your medicines with you each time you come to clinic.  Medicines may include prescription medications, over-the-counter medications, herbal remedies, eye drops, vitamins, or other pills.   Progress Toward Treatment Goals:  Treatment Goal 11/20/2013  Blood pressure deteriorated  Prevent falls unchanged    Self Care Goals & Plans:  Self Care Goal 08/20/2014  Manage my medications take my medicines as prescribed; bring my medications to every visit; refill my medications on time  Monitor my health -  Eat healthy foods drink diet soda or water instead of juice or soda; eat more vegetables; eat foods that are low in salt; eat baked foods instead of fried foods; eat fruit for snacks and desserts  Be physically active -  Meeting treatment goals -    No flowsheet data found.   Care Management & Community Referrals:  Referral 07/31/2012  Referrals made for care management support social worker; [facility] case management program     Apixaban oral tablets What is this medicine? APIXABAN (a PIX a ban) is an anticoagulant (blood thinner). It is used to lower the chance of stroke in people with a medical condition called atrial fibrillation. It is also used to treat or prevent blood clots in the lungs or in the veins. This medicine may be used for other purposes; ask your health care provider or pharmacist if you have questions. COMMON BRAND NAME(S): Eliquis What should I tell my health care provider before I take this medicine? They need to know if you have any of these conditions: -bleeding disorders -bleeding in the brain -blood in your stools (black or tarry stools) or if you have blood in your  vomit -history of stomach bleeding -kidney disease -liver disease -mechanical heart valve -an unusual or allergic reaction to apixaban, other medicines, foods, dyes, or preservatives -pregnant or trying to get pregnant -breast-feeding How should I use this medicine? Take this medicine by mouth with a glass of water. Follow the directions on the prescription label. You can take it with or without food. If it upsets your stomach, take it with food. Take your medicine at regular intervals. Do not take it more often than directed. Do not stop taking except on your doctor's advice. Stopping this medicine may increase your risk of a blot clot. Be sure to refill your prescription before you run out of medicine. Talk to your pediatrician regarding the use of this medicine in children. Special care may be needed. Overdosage: If you think you have taken too much of this medicine contact a poison control center or emergency room at once. NOTE: This medicine is only for you. Do not share this medicine with others. What if I miss a dose? If you miss a dose, take it as soon as you can. If it is almost time for your next dose, take only that dose. Do not take double or extra doses. What may interact with this medicine? This medicine may interact with the following: -aspirin and aspirin-like medicines -certain medicines for fungal infections like ketoconazole and itraconazole -certain medicines for seizures like carbamazepine and phenytoin -certain medicines that treat or prevent blood clots like warfarin, enoxaparin, and dalteparin -clarithromycin -NSAIDs, medicines for pain and inflammation, like  ibuprofen or naproxen -rifampin -ritonavir -St. John's wort This list may not describe all possible interactions. Give your health care provider a list of all the medicines, herbs, non-prescription drugs, or dietary supplements you use. Also tell them if you smoke, drink alcohol, or use illegal drugs. Some items  may interact with your medicine. What should I watch for while using this medicine? Notify your doctor or health care professional and seek emergency treatment if you develop breathing problems; changes in vision; chest pain; severe, sudden headache; pain, swelling, warmth in the leg; trouble speaking; sudden numbness or weakness of the face, arm, or leg. These can be signs that your condition has gotten worse. If you are going to have surgery, tell your doctor or health care professional that you are taking this medicine. Tell your health care professional that you use this medicine before you have a spinal or epidural procedure. Sometimes people who take this medicine have bleeding problems around the spine when they have a spinal or epidural procedure. This bleeding is very rare. If you have a spinal or epidural procedure while on this medicine, call your health care professional immediately if you have back pain, numbness or tingling (especially in your legs and feet), muscle weakness, paralysis, or loss of bladder or bowel control. Avoid sports and activities that might cause injury while you are using this medicine. Severe falls or injuries can cause unseen bleeding. Be careful when using sharp tools or knives. Consider using an Neurosurgeonelectric razor. Take special care brushing or flossing your teeth. Report any injuries, bruising, or red spots on the skin to your doctor or health care professional. What side effects may I notice from receiving this medicine? Side effects that you should report to your doctor or health care professional as soon as possible: -allergic reactions like skin rash, itching or hives, swelling of the face, lips, or tongue -signs and symptoms of bleeding such as bloody or black, tarry stools; red or dark-brown urine; spitting up blood or brown material that looks like coffee grounds; red spots on the skin; unusual bruising or bleeding from the eye, gums, or nose This list may not  describe all possible side effects. Call your doctor for medical advice about side effects. You may report side effects to FDA at 1-800-FDA-1088. Where should I keep my medicine? Keep out of the reach of children. Store at room temperature between 20 and 25 degrees C (68 and 77 degrees F). Throw away any unused medicine after the expiration date. NOTE: This sheet is a summary. It may not cover all possible information. If you have questions about this medicine, talk to your doctor, pharmacist, or health care provider.  2015, [employer]vier/Gold Standard. (2012-12-22 11:59:24)

## 2014-08-20 NOTE — Assessment & Plan Note (Signed)
Patient reports that her symptoms of depression and not well controlled with her current medication. She takes Zoloft 25 mg daily. Plan -Increase Zoloft to 50 mg daily at bedtime. -Evaluating the month to see if she is benefiting from this increased dose. May consider switching to a different antidepressant.

## 2014-08-20 NOTE — Progress Notes (Signed)
Patient ID: Lisa Haynes, female   DOB: 02/01/1941, 74 y.o.   MRN: [id number]   Subjective:   HPI: [name] is a 74 y.o. woman with past medical history as listed below presents for hospital follow-up visit. She was hospitalized between 06/20/2014 to 06/22/2014 for acute pancreatitis  Reason(s) for this visit: 1. Cholelithiasis and Pancreatitis:  Patient was admitted for 2 days 2 months ago for an episode of pancreatitis which without being induced by gallstone obstruction. She was found to have cholelithiasis on upper right quadrant ultrasound scan. Her symptoms abdominal pain, nausea and vomiting resolved during her hospital stay and have not returned. The plan was to arrange for outpatient surgery consult for elective cholecystectomy. I will refer the patient to general surgery today. 2. Atrial fibrillation: She was discovered to have paroxysmal atrial fibrillation and she had an episode of RVR with heart rate in the 170s. CHA2DS2-VASc Score of 4. She reports a few episodes of palpitations but without chest pain, shortness of breath or any other cardiopulmonary symptoms. She has no edema of her lower extremities. Risks and benefits of anticoagulation have been discussed with the patient and her niece in the room. Will start her on an oral anticoagulant today. I will also put in a referral to cardiology.  3. Depression and anxiety: Patient reports symptoms of depression with episodes of crying and worrying excessively. She is currently on Zoloft 25 mg daily. I will increase this to 50 mg daily and reevaluate her. 4. Hypertension: Blood pressure 151/62. I will maintain her on current treatment today.   Past Medical History  Diagnosis Date  . Bilateral knee pain   . Depression   . Hypertension   . Low back pain   . Osteoarthritis   . Osteoporosis   . Hypothyroidism   . Pneumonia     hx of PNA 2013  . Incontinence   . Paroxysmal atrial fibrillation 08/20/2014     ROS: Constitutional: Denies fever, chills, diaphoresis, appetite change and fatigue.  Respiratory: Denies SOB, DOE, cough, chest tightness, and wheezing. Denies chest pain. CVS: No chest pain,and leg swelling.  GI: No abdominal pain, nausea, vomiting, bloody stools GU: No dysuria, frequency, hematuria, or flank pain.  MSK: No myalgias, back pain, joint swelling, arthralgias  Psych: Presence of depressive symptoms while taking Zoloft 25 mg daily. No SI or SA.    Objective:  Physical Exam: Filed Vitals:   08/20/14 1106  BP: 151/62  Pulse: 68  Temp: 98.2 F (36.8 C)  TempSrc: Oral  Height: 5' (1.524 m)  Weight: 216 lb 12.8 oz (98.34 kg)  SpO2: 99%   General: Well nourished. No acute distress.  HEENT: Normal oral mucosa. MMM.  Lungs: CTA bilaterally. Heart: Irregularly irregular heart rhythm; no extra sounds or murmurs  Abdomen: Non-distended, normal bowel sounds, soft, nontender; no hepatosplenomegaly  Extremities: No pedal edema. No joint swelling or tenderness. Neurologic: Normal EOM,  Alert and oriented x3. No obvious neurologic/cranial nerve deficits.  Assessment & Plan:  Discussed case with my attending in the clinic, Dr. Rogelia BogaButcher. See problem based charting.

## 2014-08-20 NOTE — Addendum Note (Signed)
Addended by: Dow AdolphKAZIBWE, Karolyne Timmons on: 08/20/2014 04:17 PM   Modules accepted: Level of Service

## 2014-08-20 NOTE — Assessment & Plan Note (Signed)
Has not had recurrent falls since returning home. Discussed with the patient about risks of falls in the setting of use of active oral anticoagulants. Will continue to evaluate. Risks and benefits of anticoagulation with recurrent falls.

## 2014-08-20 NOTE — Assessment & Plan Note (Signed)
Minimal bilateral pitting edema. Continue holding Lasix. Check BMP today.

## 2014-08-20 NOTE — Assessment & Plan Note (Signed)
She does have episodic symptoms of palpitations. She takes aspirin 81 mg daily. I discussed risks and benefits of anticoagulation with the patient and her niece. Plan -Start Eliquis - pharmacist, Dr. Selena BattenKim helping with this. -Continue with regular assessment of risks and benefits of continuing anticoagulation. -Stop aspirin -Referral to cardiology - follow-up here in 1-2 months.

## 2014-08-20 NOTE — Assessment & Plan Note (Signed)
Currently asymptomatic. Plan -Referral to general surgery for consideration for elective cholecystectomy.

## 2014-08-21 NOTE — Progress Notes (Signed)
Internal Medicine Clinic Attending  Case discussed with Dr. Rivet at the time of the visit.  We reviewed the resident's history and exam and pertinent patient test results.  I agree with the assessment, diagnosis, and plan of care documented in the resident's note.  

## 2014-08-22 ENCOUNTER — Other Ambulatory Visit: Payer: Self-pay | Admitting: Internal Medicine

## 2014-08-22 DIAGNOSIS — K859 Acute pancreatitis, unspecified: Secondary | ICD-10-CM | POA: Diagnosis not present

## 2014-08-22 DIAGNOSIS — F0390 Unspecified dementia without behavioral disturbance: Secondary | ICD-10-CM | POA: Diagnosis not present

## 2014-08-22 DIAGNOSIS — I480 Paroxysmal atrial fibrillation: Secondary | ICD-10-CM | POA: Diagnosis not present

## 2014-08-22 DIAGNOSIS — I10 Essential (primary) hypertension: Secondary | ICD-10-CM | POA: Diagnosis not present

## 2014-08-22 DIAGNOSIS — K8020 Calculus of gallbladder without cholecystitis without obstruction: Secondary | ICD-10-CM | POA: Diagnosis not present

## 2014-08-22 DIAGNOSIS — K5790 Diverticulosis of intestine, part unspecified, without perforation or abscess without bleeding: Secondary | ICD-10-CM | POA: Diagnosis not present

## 2014-09-10 DIAGNOSIS — K851 Biliary acute pancreatitis: Secondary | ICD-10-CM | POA: Diagnosis not present

## 2014-09-12 ENCOUNTER — Encounter: Payer: Self-pay | Admitting: *Deleted

## 2014-09-27 ENCOUNTER — Inpatient Hospital Stay (HOSPITAL_COMMUNITY): Payer: Medicare Other | Admitting: Anesthesiology

## 2014-09-27 ENCOUNTER — Inpatient Hospital Stay (HOSPITAL_COMMUNITY)
Admission: EM | Admit: 2014-09-27 | Discharge: 2014-10-02 | DRG: 417 | Disposition: A | Discharge status: Home / Self Care | Payer: Medicare Other | Attending: Internal Medicine | Admitting: Internal Medicine

## 2014-09-27 ENCOUNTER — Inpatient Hospital Stay (HOSPITAL_COMMUNITY): Payer: Medicare Other

## 2014-09-27 ENCOUNTER — Encounter (HOSPITAL_COMMUNITY): Payer: Self-pay | Admitting: *Deleted

## 2014-09-27 ENCOUNTER — Other Ambulatory Visit (HOSPITAL_COMMUNITY): Payer: Self-pay

## 2014-09-27 ENCOUNTER — Emergency Department (HOSPITAL_COMMUNITY): Payer: Medicare Other

## 2014-09-27 ENCOUNTER — Encounter (HOSPITAL_COMMUNITY)
Admission: EM | Disposition: A | Discharge status: Home / Self Care | Payer: Self-pay | Source: Home / Self Care | Attending: Internal Medicine

## 2014-09-27 DIAGNOSIS — Z9049 Acquired absence of other specified parts of digestive tract: Secondary | ICD-10-CM | POA: Diagnosis present

## 2014-09-27 DIAGNOSIS — Z01810 Encounter for preprocedural cardiovascular examination: Secondary | ICD-10-CM | POA: Diagnosis not present

## 2014-09-27 DIAGNOSIS — Z9119 Patient's noncompliance with other medical treatment and regimen: Secondary | ICD-10-CM | POA: Diagnosis present

## 2014-09-27 DIAGNOSIS — E785 Hyperlipidemia, unspecified: Secondary | ICD-10-CM | POA: Diagnosis present

## 2014-09-27 DIAGNOSIS — Z6841 Body Mass Index (BMI) 40.0 and over, adult: Secondary | ICD-10-CM | POA: Diagnosis not present

## 2014-09-27 DIAGNOSIS — K8066 Calculus of gallbladder and bile duct with acute and chronic cholecystitis without obstruction: Principal | ICD-10-CM | POA: Diagnosis present

## 2014-09-27 DIAGNOSIS — K859 Acute pancreatitis without necrosis or infection, unspecified: Secondary | ICD-10-CM | POA: Diagnosis present

## 2014-09-27 DIAGNOSIS — K851 Biliary acute pancreatitis without necrosis or infection: Secondary | ICD-10-CM

## 2014-09-27 DIAGNOSIS — R1012 Left upper quadrant pain: Secondary | ICD-10-CM | POA: Diagnosis not present

## 2014-09-27 DIAGNOSIS — I480 Paroxysmal atrial fibrillation: Secondary | ICD-10-CM | POA: Diagnosis not present

## 2014-09-27 DIAGNOSIS — K219 Gastro-esophageal reflux disease without esophagitis: Secondary | ICD-10-CM | POA: Diagnosis present

## 2014-09-27 DIAGNOSIS — M810 Age-related osteoporosis without current pathological fracture: Secondary | ICD-10-CM | POA: Diagnosis present

## 2014-09-27 DIAGNOSIS — F418 Other specified anxiety disorders: Secondary | ICD-10-CM | POA: Diagnosis present

## 2014-09-27 DIAGNOSIS — F329 Major depressive disorder, single episode, unspecified: Secondary | ICD-10-CM | POA: Diagnosis present

## 2014-09-27 DIAGNOSIS — N179 Acute kidney failure, unspecified: Secondary | ICD-10-CM

## 2014-09-27 DIAGNOSIS — R1013 Epigastric pain: Secondary | ICD-10-CM | POA: Diagnosis not present

## 2014-09-27 DIAGNOSIS — R932 Abnormal findings on diagnostic imaging of liver and biliary tract: Secondary | ICD-10-CM | POA: Diagnosis not present

## 2014-09-27 DIAGNOSIS — E039 Hypothyroidism, unspecified: Secondary | ICD-10-CM | POA: Diagnosis present

## 2014-09-27 DIAGNOSIS — L89151 Pressure ulcer of sacral region, stage 1: Secondary | ICD-10-CM | POA: Diagnosis present

## 2014-09-27 DIAGNOSIS — D72829 Elevated white blood cell count, unspecified: Secondary | ICD-10-CM

## 2014-09-27 DIAGNOSIS — K8050 Calculus of bile duct without cholangitis or cholecystitis without obstruction: Secondary | ICD-10-CM | POA: Diagnosis not present

## 2014-09-27 DIAGNOSIS — I10 Essential (primary) hypertension: Secondary | ICD-10-CM | POA: Diagnosis present

## 2014-09-27 DIAGNOSIS — M1990 Unspecified osteoarthritis, unspecified site: Secondary | ICD-10-CM | POA: Diagnosis present

## 2014-09-27 DIAGNOSIS — K8020 Calculus of gallbladder without cholecystitis without obstruction: Secondary | ICD-10-CM | POA: Diagnosis not present

## 2014-09-27 DIAGNOSIS — Z7901 Long term (current) use of anticoagulants: Secondary | ICD-10-CM | POA: Diagnosis not present

## 2014-09-27 DIAGNOSIS — K8010 Calculus of gallbladder with chronic cholecystitis without obstruction: Secondary | ICD-10-CM | POA: Diagnosis not present

## 2014-09-27 DIAGNOSIS — M545 Low back pain: Secondary | ICD-10-CM | POA: Diagnosis not present

## 2014-09-27 DIAGNOSIS — Z79899 Other long term (current) drug therapy: Secondary | ICD-10-CM

## 2014-09-27 DIAGNOSIS — R109 Unspecified abdominal pain: Secondary | ICD-10-CM

## 2014-09-27 DIAGNOSIS — K8012 Calculus of gallbladder with acute and chronic cholecystitis without obstruction: Secondary | ICD-10-CM | POA: Diagnosis not present

## 2014-09-27 DIAGNOSIS — Z7982 Long term (current) use of aspirin: Secondary | ICD-10-CM | POA: Diagnosis not present

## 2014-09-27 DIAGNOSIS — Z9889 Other specified postprocedural states: Secondary | ICD-10-CM | POA: Diagnosis not present

## 2014-09-27 DIAGNOSIS — R1084 Generalized abdominal pain: Secondary | ICD-10-CM | POA: Diagnosis not present

## 2014-09-27 HISTORY — PX: ERCP: SHX5425

## 2014-09-27 LAB — COMPREHENSIVE METABOLIC PANEL
ALBUMIN: 3.9 g/dL (ref 3.5–5.0)
ALK PHOS: 118 U/L (ref 38–126)
ALT: 90 U/L — AB (ref 14–54)
AST: 147 U/L — AB (ref 15–41)
Anion gap: 12 (ref 5–15)
BUN: 20 mg/dL (ref 6–20)
CALCIUM: 9.8 mg/dL (ref 8.9–10.3)
CHLORIDE: 103 mmol/L (ref 101–111)
CO2: 24 mmol/L (ref 22–32)
Creatinine, Ser: 1.19 mg/dL — ABNORMAL HIGH (ref 0.44–1.00)
GFR calc Af Amer: 50 mL/min — ABNORMAL LOW (ref >60)
GFR calc non Af Amer: 43 mL/min — ABNORMAL LOW (ref >60)
Glucose, Bld: 116 mg/dL — ABNORMAL HIGH (ref 65–99)
Potassium: 4.2 mmol/L (ref 3.5–5.1)
Sodium: 139 mmol/L (ref 135–145)
Total Bilirubin: 1.1 mg/dL (ref 0.3–1.2)
Total Protein: 7.7 g/dL (ref 6.5–8.1)

## 2014-09-27 LAB — CBC WITH DIFFERENTIAL/PLATELET
BASOS ABS: 0 10*3/uL (ref 0.0–0.1)
BASOS PCT: 0 % (ref 0–1)
EOS PCT: 0 % (ref 0–5)
Eosinophils Absolute: 0.1 10*3/uL (ref 0.0–0.7)
HCT: 44.2 % (ref 36.0–46.0)
HEMOGLOBIN: 14 g/dL (ref 12.0–15.0)
LYMPHS PCT: 10 % — AB (ref 12–46)
Lymphs Abs: 1.5 10*3/uL (ref 0.7–4.0)
MCH: 29.5 pg (ref 26.0–34.0)
MCHC: 31.7 g/dL (ref 30.0–36.0)
MCV: 93.2 fL (ref 78.0–100.0)
Monocytes Absolute: 1.6 10*3/uL — ABNORMAL HIGH (ref 0.1–1.0)
Monocytes Relative: 10 % (ref 3–12)
NEUTROS PCT: 80 % — AB (ref 43–77)
Neutro Abs: 11.9 10*3/uL — ABNORMAL HIGH (ref 1.7–7.7)
Platelets: 181 10*3/uL (ref 150–400)
RBC: 4.74 MIL/uL (ref 3.87–5.11)
RDW: 14.3 % (ref 11.5–15.5)
WBC: 15.1 10*3/uL — ABNORMAL HIGH (ref 4.0–10.5)

## 2014-09-27 LAB — URINE MICROSCOPIC-ADD ON

## 2014-09-27 LAB — URINALYSIS, ROUTINE W REFLEX MICROSCOPIC
Glucose, UA: NEGATIVE mg/dL
KETONES UR: 15 mg/dL — AB
Nitrite: NEGATIVE
PH: 5 (ref 5.0–8.0)
PROTEIN: NEGATIVE mg/dL
Specific Gravity, Urine: 1.02 (ref 1.005–1.030)
UROBILINOGEN UA: 0.2 mg/dL (ref 0.0–1.0)

## 2014-09-27 LAB — GLUCOSE, CAPILLARY
Glucose-Capillary: 107 mg/dL — ABNORMAL HIGH (ref 65–99)
Glucose-Capillary: 94 mg/dL (ref 65–99)

## 2014-09-27 LAB — LIPASE, BLOOD

## 2014-09-27 LAB — MRSA PCR SCREENING: MRSA BY PCR: POSITIVE — AB

## 2014-09-27 SURGERY — ERCP, WITH INTERVENTION IF INDICATED
Anesthesia: General

## 2014-09-27 MED ORDER — ONDANSETRON HCL 4 MG/2ML IJ SOLN: 4.0000 mg | Freq: Four times a day (QID) | INTRAMUSCULAR | Status: DC | PRN

## 2014-09-27 MED ORDER — IOHEXOL 300 MG/ML  SOLN
80.0000 mL | Freq: Once | INTRAMUSCULAR | Status: AC | PRN
  Administered 2014-09-27: 80 mL via INTRAVENOUS

## 2014-09-27 MED ORDER — CIPROFLOXACIN IN D5W 400 MG/200ML IV SOLN
INTRAVENOUS | Status: DC | PRN
  Administered 2014-09-27: 400 mg via INTRAVENOUS

## 2014-09-27 MED ORDER — SODIUM CHLORIDE 0.9 % IJ SOLN
3.0000 mL | Freq: Two times a day (BID) | INTRAMUSCULAR | Status: DC
  Administered 2014-09-27 – 2014-10-01 (×7): 3 mL via INTRAVENOUS

## 2014-09-27 MED ORDER — HEPARIN SODIUM (PORCINE) 5000 UNIT/ML IJ SOLN
5000.0000 [IU] | Freq: Three times a day (TID) | INTRAMUSCULAR | Status: DC
  Administered 2014-09-27: 5000 [IU] via SUBCUTANEOUS
  Filled 2014-09-27 (×2): qty 1

## 2014-09-27 MED ORDER — SODIUM CHLORIDE 0.9 % IV BOLUS (SEPSIS)
1000.0000 mL | Freq: Once | INTRAVENOUS | Status: AC
  Administered 2014-09-27: 1000 mL via INTRAVENOUS

## 2014-09-27 MED ORDER — CHLORHEXIDINE GLUCONATE CLOTH 2 % EX PADS
6.0000 | MEDICATED_PAD | Freq: Every day | CUTANEOUS | Status: AC
  Administered 2014-09-27 – 2014-10-01 (×5): 6 via TOPICAL

## 2014-09-27 MED ORDER — SUCCINYLCHOLINE CHLORIDE 20 MG/ML IJ SOLN
INTRAMUSCULAR | Status: DC | PRN
  Administered 2014-09-27: 100 mg via INTRAVENOUS

## 2014-09-27 MED ORDER — LIDOCAINE HCL (CARDIAC) 20 MG/ML IV SOLN
INTRAVENOUS | Status: DC | PRN
  Administered 2014-09-27: 60 mg via INTRAVENOUS

## 2014-09-27 MED ORDER — ONDANSETRON HCL 4 MG/2ML IJ SOLN
4.0000 mg | Freq: Once | INTRAMUSCULAR | Status: AC
  Administered 2014-09-27: 4 mg via INTRAVENOUS
  Filled 2014-09-27: qty 2

## 2014-09-27 MED ORDER — LACTATED RINGERS IV SOLN
INTRAVENOUS | Status: DC | PRN
  Administered 2014-09-27: 13:00:00 via INTRAVENOUS

## 2014-09-27 MED ORDER — MUPIROCIN 2 % EX OINT
1.0000 "application " | TOPICAL_OINTMENT | Freq: Two times a day (BID) | CUTANEOUS | Status: AC
  Administered 2014-09-27 – 2014-10-01 (×10): 1 via NASAL
  Filled 2014-09-27 (×2): qty 22

## 2014-09-27 MED ORDER — FENTANYL CITRATE (PF) 100 MCG/2ML IJ SOLN
INTRAMUSCULAR | Status: DC | PRN
  Administered 2014-09-27: 100 ug via INTRAVENOUS

## 2014-09-27 MED ORDER — PROPOFOL 10 MG/ML IV BOLUS
INTRAVENOUS | Status: DC | PRN
  Administered 2014-09-27: 130 mg via INTRAVENOUS

## 2014-09-27 MED ORDER — MORPHINE SULFATE 2 MG/ML IJ SOLN
1.0000 mg | INTRAMUSCULAR | Status: DC | PRN
  Administered 2014-09-27 – 2014-09-29 (×2): 2 mg via INTRAVENOUS
  Filled 2014-09-27 (×2): qty 1

## 2014-09-27 MED ORDER — SODIUM CHLORIDE 0.9 % IV SOLN: INTRAVENOUS | Status: DC

## 2014-09-27 MED ORDER — CIPROFLOXACIN IN D5W 400 MG/200ML IV SOLN
INTRAVENOUS | Status: AC
  Filled 2014-09-27: qty 200

## 2014-09-27 MED ORDER — SODIUM CHLORIDE 0.9 % IV SOLN
INTRAVENOUS | Status: DC | PRN
  Administered 2014-09-27: 15 mL

## 2014-09-27 MED ORDER — MORPHINE SULFATE 4 MG/ML IJ SOLN
4.0000 mg | Freq: Once | INTRAMUSCULAR | Status: AC
  Administered 2014-09-27: 4 mg via INTRAVENOUS
  Filled 2014-09-27: qty 1

## 2014-09-27 MED ORDER — MEPERIDINE HCL 25 MG/ML IJ SOLN: 6.2500 mg | INTRAMUSCULAR | Status: DC | PRN

## 2014-09-27 MED ORDER — ONDANSETRON HCL 4 MG/2ML IJ SOLN
INTRAMUSCULAR | Status: DC | PRN
  Administered 2014-09-27: 4 mg via INTRAVENOUS

## 2014-09-27 MED ORDER — SODIUM CHLORIDE 0.9 % IV SOLN
INTRAVENOUS | Status: AC
  Administered 2014-09-27: 04:00:00 via INTRAVENOUS

## 2014-09-27 MED ORDER — PROMETHAZINE HCL 25 MG/ML IJ SOLN: 6.2500 mg | INTRAMUSCULAR | Status: DC | PRN

## 2014-09-27 MED ORDER — SODIUM CHLORIDE 0.9 % IV BOLUS (SEPSIS)
500.0000 mL | Freq: Once | INTRAVENOUS | Status: AC
Start: 2014-09-27 — End: 2014-09-27
  Administered 2014-09-27: 500 mL via INTRAVENOUS

## 2014-09-27 MED ORDER — IOHEXOL 300 MG/ML  SOLN
25.0000 mL | Freq: Once | INTRAMUSCULAR | Status: AC | PRN
  Administered 2014-09-27: 25 mL via ORAL

## 2014-09-27 MED ORDER — SODIUM CHLORIDE 0.9 % IV SOLN
INTRAVENOUS | Status: DC
  Administered 2014-09-27 – 2014-10-02 (×8): via INTRAVENOUS

## 2014-09-27 MED ORDER — ONDANSETRON HCL 4 MG PO TABS: 4.0000 mg | ORAL_TABLET | Freq: Four times a day (QID) | ORAL | Status: DC | PRN

## 2014-09-27 MED ORDER — HEPARIN SODIUM (PORCINE) 5000 UNIT/ML IJ SOLN
5000.0000 [IU] | Freq: Three times a day (TID) | INTRAMUSCULAR | Status: AC
  Administered 2014-09-27: 5000 [IU] via SUBCUTANEOUS
  Filled 2014-09-27: qty 1

## 2014-09-27 NOTE — ED Notes (Addendum)
Pt. Has a history of gallbladder issues but her surgeon wont take it out due to cardiac issues and trying to wean off coumadin. Pt. Ate dinner last night and threw up meal and has abdominal pain since. Pt. Has had a decreased appetite over the past couple of days. Pt. Also had an episode of diarrhea

## 2014-09-27 NOTE — Transfer of Care (Signed)
Immediate Anesthesia Transfer of Care Note  Patient: Lisa Haynes  Procedure(s) Performed: Procedure(s): ENDOSCOPIC RETROGRADE CHOLANGIOPANCREATOGRAPHY (ERCP) (N/A)  Patient Location: PACU and Endoscopy Unit  Anesthesia Type:General  Level of Consciousness: awake, alert , oriented and patient cooperative  Airway & Oxygen Therapy: Patient Spontanous Breathing and Patient connected to nasal cannula oxygen  Post-op Assessment: Report given to RN, Post -op Vital signs reviewed and stable and Patient moving all extremities  Post vital signs: Reviewed and stable  Last Vitals:  Filed Vitals:   09/27/14 1411  BP: 177/71  Pulse: 80  Temp: 36.8 C  Resp: 24    Complications: No apparent anesthesia complications

## 2014-09-27 NOTE — ED Provider Notes (Signed)
CSN: [id number]     Arrival date & time 09/27/14  0115 History   First MD Initiated Contact with Patient 09/27/14 0159     This chart was scribed for Loren Racer, MD by Arlan Organ, ED Scribe. This patient was seen in room A08C/A08C and the patient's care was started 2:08 AM.   Chief Complaint  Patient presents with  . Abdominal Pain   The history is provided by the patient. No language interpreter was used.    HPI Comments: Lisa Haynes is a 74 y.o. female with a PMHx of HTN who presents to the Emergency Department complaining of constant, ongoing, unchanged abdominal pain x 4 hours while in bed at rest. She also reports associated decreased appetite, nausea,  3 episodes of vomiting this evening. She had one loose bowel movement with small amount of stool. She denies any recent fever or chills. Pt admits to a history of cholelithiasis and pancreatitis. No known allergies to medications.  Past Medical History  Diagnosis Date  . Bilateral knee pain   . Depression   . Hypertension   . Low back pain   . Osteoarthritis   . Osteoporosis   . Hypothyroidism   . Pneumonia     hx of PNA 2013  . Incontinence   . Paroxysmal atrial fibrillation 08/20/2014   Past Surgical History  Procedure Laterality Date  . Vaginal hysterectomy      with unilateral oophorectomy   History reviewed. No pertinent family history. History  Substance Use Topics  . Smoking status: Never Smoker   . Smokeless tobacco: Never Used  . Alcohol Use: No   OB History    No data available     Review of Systems  Constitutional: Negative for fever and chills.  Respiratory: Negative for shortness of breath.   Cardiovascular: Negative for chest pain.  Gastrointestinal: Positive for nausea, vomiting, abdominal pain and diarrhea. Negative for blood in stool.  Genitourinary: Negative for dysuria, frequency and flank pain.  Musculoskeletal: Negative for myalgias, back pain, neck pain and neck stiffness.  Skin:  Negative for rash.  Neurological: Negative for dizziness, weakness, light-headedness, numbness and headaches.  Psychiatric/Behavioral: Negative for confusion.  All other systems reviewed and are negative.     Allergies  Review of patient's allergies indicates no known allergies.  Home Medications   Prior to Admission medications   Medication Sig Start Date End Date Taking? Authorizing Provider  acetaminophen (TYLENOL) 325 MG tablet Take 2 tablets (650 mg total) by mouth every 6 (six) hours as needed for pain. 06/29/12  Yes Dow Adolph, MD  aspirin EC 81 MG tablet Take 81 mg by mouth daily.   Yes Historical Provider, MD  Calcium Carbonate-Vitamin D (CALCARB 600/D) 600-400 MG-UNIT per tablet Take 1 tablet by mouth 3 (three) times daily with meals. Patient taking differently: Take 1 tablet by mouth daily.  06/29/12  Yes Dow Adolph, MD  lisinopril (PRINIVIL,ZESTRIL) 10 MG tablet TAKE 1 TABLET BY MOUTH EVERY DAY 08/23/14  Yes Baltazar Apo, MD  sertraline (ZOLOFT) 50 MG tablet Take 1 tablet (50 mg total) by mouth at bedtime. Patient taking differently: Take 25 mg by mouth at bedtime.  08/20/14  Yes Dow Adolph, MD  simvastatin (ZOCOR) 10 MG tablet TAKE 1 TABLET BY MOUTH AT BEDTIME 10/02/13  Yes Baltazar Apo, MD  apixaban (ELIQUIS) 5 MG TABS tablet Take 1 tablet (5 mg total) by mouth 2 (two) times daily. Patient not taking: Reported on 09/27/2014 08/20/14  Dow Adolph, MD  Multiple Vitamins-Minerals (MULTIVITAMIN WITH MINERALS) tablet Take 1 tablet by mouth daily.    Historical Provider, MD  omeprazole (PRILOSEC) 20 MG capsule Take 1 capsule (20 mg total) by mouth daily. Patient not taking: Reported on 09/27/2014 07/31/14   Baltazar Apo, MD   Triage Vitals: BP 153/56 mmHg  Pulse 69  Temp(Src) 97.4 F (36.3 C) (Oral)  Resp 12  Ht  (1.499 m)  Wt 202 lb (91.627 kg)  BMI 40.78 kg/m2  SpO2 92%   Physical Exam  Constitutional: She is oriented to person, place, and  time. She appears well-developed and well-nourished. No distress.  HENT:  Head: Normocephalic and atraumatic.  Mouth/Throat: Oropharynx is clear and moist.  Eyes: EOM are normal. Pupils are equal, round, and reactive to light.  Neck: Normal range of motion. Neck supple.  Cardiovascular: Normal rate and regular rhythm.   Pulmonary/Chest: Effort normal and breath sounds normal. No respiratory distress. She has no wheezes. She has no rales. She exhibits no tenderness.  Abdominal: Soft. Bowel sounds are normal. She exhibits distension. She exhibits no mass. There is tenderness (Tenderness to palpation diffusely but especially in epigastric and left upper quadrant.). There is no rebound and no guarding.  Musculoskeletal: Normal range of motion. She exhibits no edema or tenderness.  No CVA tenderness bilaterally.  Neurological: She is alert and oriented to person, place, and time.  Skin: Skin is warm and dry. No rash noted. No erythema.  Psychiatric: She has a normal mood and affect. Her behavior is normal.  Nursing note and vitals reviewed.   ED Course  Procedures (including critical care time)  DIAGNOSTIC STUDIES: Oxygen Saturation is 96% on RA, adequate by my interpretation.    COORDINATION OF CARE: 2:08 AM- Will give Morphine injection, Zofran, and fluids. Will order CT abdomen pelvic with contrast, CBC, CMP, and lipase. Discussed treatment plan with pt at bedside and pt agreed to plan.     Labs Review Labs Reviewed  CBC WITH DIFFERENTIAL/PLATELET - Abnormal; Notable for the following:    WBC 15.1 (*)    Neutrophils Relative % 80 (*)    Neutro Abs 11.9 (*)    Lymphocytes Relative 10 (*)    Monocytes Absolute 1.6 (*)    All other components within normal limits  COMPREHENSIVE METABOLIC PANEL - Abnormal; Notable for the following:    Glucose, Bld 116 (*)    Creatinine, Ser 1.19 (*)    AST 147 (*)    ALT 90 (*)    GFR calc non Af Amer 43 (*)    GFR calc Af Amer 50 (*)    All  other components within normal limits  LIPASE, BLOOD - Abnormal; Notable for the following:    Lipase >3000 (*)    All other components within normal limits  URINALYSIS, ROUTINE W REFLEX MICROSCOPIC (NOT AT [facility])    Imaging Review Ct Abdomen Pelvis W Contrast  09/27/2014   CLINICAL DATA:  Abdominal pain  EXAM: CT ABDOMEN AND PELVIS WITH CONTRAST  TECHNIQUE: Multidetector CT imaging of the abdomen and pelvis was performed using the standard protocol following bolus administration of intravenous contrast.  CONTRAST:  80mL OMNIPAQUE IOHEXOL 300 MG/ML  SOLN  COMPARISON:  06/20/2014  FINDINGS: BODY WALL: No contributory findings.  LOWER CHEST: No contributory findings.  ABDOMEN/PELVIS:  Liver: 14 mm lesion in the medial left liver has mildly indistinct margins but is stable since 2008. A tiny low-density in the posterior right liver on  image 18 is also stable and considered incidental. Possible mild hepatic steatosis.  Biliary: Cholelithiasis. There is a newly seen stone in the proximal common bile duct. No CBD dilation. No evidence of cholecystitis.  Pancreas: Peripancreatic edema and diffuse pancreatic expansion. No necrosis or organized collection. No vascular complication.  Spleen: Unremarkable.  Adrenals: Unremarkable.  Kidneys and ureters: No hydronephrosis or stone.  Bladder: Chronic mild bladder wall thickening and indistinctness.  Reproductive: Hysterectomy and oophorectomies. Marked pelvic floor laxity.  Bowel: No obstruction. The appendix is distended with stool, but there is no acute inflammatory change. Extensive distal colonic diverticulosis.  Retroperitoneum: No mass or adenopathy.  Peritoneum: No ascites or pneumoperitoneum.  Vascular: No acute abnormality.  OSSEOUS: No acute abnormalities.  IMPRESSION: 1. Interstitial edematous pancreatitis, likely biliary given cholelithiasis and choledocholithiasis. 2. Chronic findings are noted above.   Electronically Signed   By: Marnee SpringJonathon  Watts M.D.   On:  09/27/2014 03:31     EKG Interpretation None      MDM   Final diagnoses:  Abdominal pain  Acute biliary pancreatitis    I personally performed the services described in this documentation, which was scribed in my presence. The recorded information has been reviewed and is accurate.  Discussed results with Dr Virgina OrganQureshi who will see the patient in the ED and admit.  Loren Raceravid Holley Wirt, MD 09/27/14 [phone]

## 2014-09-27 NOTE — H&P (Addendum)
Date: 09/27/2014               Patient Name:  Lisa Haynes MRN: [id number]  DOB: [birth year] Age / Sex: 74 y.o., female   PCP: Baltazar Apo, MD         Medical Service: Internal Medicine Teaching Service         Attending Physician: Dr. Inez Catalina, MD    First Contact: Dr. Griffin Basil Pager: [phone]  Second Contact: Dr. Lauris Chroman Pager: [phone]       After Hours (After 5p/  First Contact Pager: [phone]  weekends / holidays): Second Contact Pager: [phone]   Chief Complaint: abdominal pain  History of Present Illness: Lisa Haynes is a 74 year old woman with history of pancreatitis, paroxysmal atrial fibrillation, HTN, HL, GERD who presents with abdominal pain. Of note, she was recently hospitalized from 2/18-2/20/16 for acute pancreatitis due to gallstone. On hospital follow-up, she was referred to general surgery for evaluation of possible elective cholecystectomy. She saw [facility] 09/10/14 who wanted cardiac clearance before proceeding with cholecystectomy. She says they mentioned she needs to be off the eliquis for possible surgery and interpreted this to mean to stop taking it and she has not since that appointment. She has not yet gone to cardiologist because her ride is out of town until July.   Tonight, she was in her usual state of health resting in bed trying to sleep when she had acute onset of diffuse, constant abdominal pain that radiates to back. She also has associated nausea with several episodes of emesis. She cannot see well so could not tell if it was bloody or bilious. She says she has had some small but loose bowel movement earlier tonight but could not see if there was melena or hematachezia. She says she has decreased appetite but earlier tonight ate a chicken pot pie and a sandwich from [facility]. She denies fevers, chills, night sweats, chest pain, SOB, dysuria, weakness. She does not drink alcohol or use illicit drugs. During interview she was no  longer in pain.  In the ED, she received NS 500 cc bolus and infusion at 75 cc/hr, morphine 4 mg iv once, zofran 4 mg iv once. She had lipase >3000 with abdominal CT noting interstitial edematous pancreatitis likely biliary given cholelithiasis and choledocholithiasis.   Meds: Current Facility-Administered Medications  Medication Dose Route Frequency Provider Last Rate Last Dose  . 0.9 %  sodium chloride infusion   Intravenous STAT Loren Racer, MD 75 mL/hr at 09/27/14 0404    . sodium chloride 0.9 % bolus 1,000 mL  1,000 mL Intravenous Once Baltazar Apo, MD        Allergies: Allergies as of 09/27/2014  . (No Known Allergies)   Past Medical History  Diagnosis Date  . Bilateral knee pain   . Depression   . Hypertension   . Low back pain   . Osteoarthritis   . Osteoporosis   . Hypothyroidism   . Pneumonia     hx of PNA 2013  . Incontinence   . Paroxysmal atrial fibrillation 08/20/2014   Past Surgical History  Procedure Laterality Date  . Vaginal hysterectomy      with unilateral oophorectomy   History reviewed. No pertinent family history. History   Social History  . Marital Status: Widowed    Spouse Name: N/A  . Number of Children: N/A  . Years of Education: N/A   Occupational History  . Retired  Social History Main Topics  . Smoking status: Never Smoker   . Smokeless tobacco: Never Used  . Alcohol Use: No  . Drug Use: No  . Sexual Activity: Not on file   Other Topics Concern  . Not on file   Social History Narrative   Retired: [employer] work   Divorced    Regular exercise- no   Lives alone    Review of Systems: Review of systems negative except as noted above per HPI  Physical Exam: Blood pressure 131/103, pulse 76, temperature 97.4 F (36.3 C), temperature source Oral, resp. rate 16, height 4\' 11"  (1.499 m), weight 202 lb (91.627 kg), SpO2 99 %.  Gen: No acute distress, A&O x 3, well developed, well nourished, obese HEENT: Atraumatic, PERRL,  EOMI, sclerae anicteric, moist mucous membranes Heart: Regular rate and rhythm, normal S1 S2, no murmurs, rubs, or gallops Lungs: Clear to auscultation bilaterally, respirations unlabored Abd: Well healed surgical scar, soft, tender diffusely, non-distended but obese, + bowel sounds, no hepatosplenomegaly Ext: No edema or cyanosis  Lab results: Basic Metabolic Panel:  Recent Labs  40/98/1105/27/16 0147  NA 139  K 4.2  CL 103  CO2 24  GLUCOSE 116*  BUN 20  CREATININE 1.19*  CALCIUM 9.8   Liver Function Tests:  Recent Labs  09/27/14 0147  AST 147*  ALT 90*  ALKPHOS 118  BILITOT 1.1  PROT 7.7  ALBUMIN 3.9    Recent Labs  09/27/14 0147  LIPASE >3000*   CBC:  Recent Labs  09/27/14 0147  WBC 15.1*  NEUTROABS 11.9*  HGB 14.0  HCT 44.2  MCV 93.2  PLT 181     Imaging results:  Ct Abdomen Pelvis W Contrast  09/27/2014   CLINICAL DATA:  Abdominal pain  EXAM: CT ABDOMEN AND PELVIS WITH CONTRAST  TECHNIQUE: Multidetector CT imaging of the abdomen and pelvis was performed using the standard protocol following bolus administration of intravenous contrast.  CONTRAST:  80mL OMNIPAQUE IOHEXOL 300 MG/ML  SOLN  COMPARISON:  06/20/2014  FINDINGS: BODY WALL: No contributory findings.  LOWER CHEST: No contributory findings.  ABDOMEN/PELVIS:  Liver: 14 mm lesion in the medial left liver has mildly indistinct margins but is stable since 2008. A tiny low-density in the posterior right liver on image 18 is also stable and considered incidental. Possible mild hepatic steatosis.  Biliary: Cholelithiasis. There is a newly seen stone in the proximal common bile duct. No CBD dilation. No evidence of cholecystitis.  Pancreas: Peripancreatic edema and diffuse pancreatic expansion. No necrosis or organized collection. No vascular complication.  Spleen: Unremarkable.  Adrenals: Unremarkable.  Kidneys and ureters: No hydronephrosis or stone.  Bladder: Chronic mild bladder wall thickening and  indistinctness.  Reproductive: Hysterectomy and oophorectomies. Marked pelvic floor laxity.  Bowel: No obstruction. The appendix is distended with stool, but there is no acute inflammatory change. Extensive distal colonic diverticulosis.  Retroperitoneum: No mass or adenopathy.  Peritoneum: No ascites or pneumoperitoneum.  Vascular: No acute abnormality.  OSSEOUS: No acute abnormalities.  IMPRESSION: 1. Interstitial edematous pancreatitis, likely biliary given cholelithiasis and choledocholithiasis. 2. Chronic findings are noted above.   Electronically Signed   By: Marnee SpringJonathon  Watts M.D.   On: 09/27/2014 03:31    Other results: EKG: low voltage, NSR, no ST or t-wave changes, compared to prior 06/21/14  Assessment & Plan by Problem: Principal Problem:   Pancreatitis Active Problems:   Hypothyroidism   Hyperlipidemia   Morbid obesity with BMI of 45.0-49.9, adult   Depression with  anxiety   Essential hypertension   GERD   Edema of both legs   Anemia   Mixed incontinence urge and stress   Paroxysmal atrial fibrillation   #Acute gallstone pancreatitis: Lisa Haynes's abdominal pain is due to pancreatitis with lipase >3000 and evidence of pancreatitis on her CT abd/pelvis. APACHE2 score of 9 on admission is 8% mortality rate but this is without ABG. This is likely due to cholelithiasis and choledocholithiasis. It may have been trigger by her fatty diet. This would also explain her elevated transaminases and leukocytosis. She had similar episode in 06/2014 and notes she has not seeked cardiac clearance for elective cholecystectomy because her ride is out of town until July. She responded well to zofran 4 mg iv once and morphine 4 mg iv once in the ED. Also of note, she has not taken eliquis in over two weeks since she saw surgery and was told she will need to be off anti-coagulation -begin fluid resuscitation with 2L NS -NS @ 250 cc/hr -NPO -morphine 1-2 mg iv q4hprn -zofran 4 mg iv q6hprn -call  general surgery in morning  #Paroxysmal atrial fibrillation: Lisa Haynes is currently in normal sinus rhythm. At home she is supposed to be on eliquis 5 mg bid but she has not taken eliquis in over two weeks since she saw surgery and was told she will need to be off anti-coagulation. She is not on any rate or rhythm control.  -hold eliquis per pharmacy -cardiac monitoring  #AKI: Presenting creatinine 1.19 up from baseline ~0.9. Likely dehydrated in setting of poor po intake and pancreatitis -hydrate as above -cont to monitor -hold ARB  #HTN: BP in ED 120s-150s/50s-100s. At home she is on lisinopril 10 mg daily, ASA 81 mg daily, simvastatin 10 mg qhs. -hold antihypertensives while NPO, also would hold ARB in setting of AKI  #GERD: Lisa Haynes says she does not have reflux and has not been taking her omeprazole 20 mg daily -hold PPI  #HL: Last lipid profile 06/2014 with cholesterol 121, LDL 74, HDL 32, and triglyceride 77. At home she is on simvastatin 10 mg qhs -hold statin while NPO  #Diet: NPO  #DVT PPx: heparin 5000 u [place] tid  #Code: Full  Dispo: Disposition is deferred at this time, awaiting improvement of current medical problems. Anticipated discharge in approximately 2 day(s).   The patient does have a current PCP Baltazar Apo, MD) and does need an [facility] hospital follow-up appointment after discharge.  The patient does not know have transportation limitations that hinder transportation to clinic appointments.  Signed: Farley Ly, MD Internal Medicine, PGY-1 Pager [phone] 09/27/2014, 4:46 AM  PLEASE NOTE - This note was inadvertently signed by a nurse, RN Lorenda Cahill.  The original note was written by Dr. [name] of Internal Medicine and I have addended with an attending attestation below.     Date: 09/27/2014  Patient name: Lisa Haynes  Medical record number: [id number]  Date of birth: 07/26/1940   I have seen and evaluated Lisa Haynes and discussed their care  with the Residency Team. Briefly, Lisa Haynes is a 74yo woman with PMH of pancreatitis, pAfib, HTN, HLD, GERD who presented with abdominal pain.  She reports that the pain came on suddenly while lying in bed.  She had nausea, vomiting and the pain radiated to her back.  She had one loose bowel movement. She had decreased appetite, but was able to tolerate dinner on day of admission.  She had no other  symptoms.  She did report to me that anytime she eats, she gets an "upset stomach."  I saw her after ERCP and she was mildly lethargic, but appropriate and answering questions.  She has a history of gallstone pancreatitis for which she was admitted in February of this year.  She was referred to Surgery in May, but was awaiting Cardiac evaluation prior to surgery when this even occurred.  On exam, she was mildly sleepy, NAD.  She had Rr, NR, no murmur noted, lungs were clear without wheezing, she has an obese abdomen, +BS, minimal diffuse pain with palpation, no LE edema.  In the ED she received fluids, morphine and zofran.  She had a very high lipase and a CT scan of the abdomen showing pancreatitis along with cholelithiasis and likely choledocholithiasis.  Other blood work showed a mild AKI of 1.19 and a WBC of 15.1  Assessment and Plan: I have seen and evaluated the patient as outlined above. I agree with the formulated Assessment and Plan as detailed in the residents' admission note, with the following changes:   1. Acute gallstone pancreatitis - Team calculated [place] 2 score of 9.   - Fluid resuscitation with NS at 250cc/hr - NPO - Morphine for pain - Zofran for nausea - Consider consult to GI and General surgery - Will likely need Cardiology as well for pre-op evaluation.    2. Paroxysmal Afib - She had been holding her eliquis in anticipation of surgery (This was likely a misunderstanding from an appointment) - Continue to hold in anticipation of surgery - Telemetry  3. AKI - 1.19, elevated  from baseline - Likely related to acute pancreatitis - IVF as noted above - hold ARB - Monitor BP  Other issues per resident note.   Inez Catalina, MD 5/27/20163:51 PM

## 2014-09-27 NOTE — Consult Note (Signed)
Reason for Consult: Gallstone pancreatitis and choledocholithiasis Referring Physician: Teaching Service  Lucilla EdinPatsy J Rowser HPI: This is a 74 year old female with a PMH of gallstone pancreatitis 06/2014, HTN, afib, osteoarthritis, and osteoporosis admitted for a recurrent episode of gallstone pancreatitis.  Her symptoms started acutely late last evening.  Upon presentation to the ER her lipase was noted to be at >3000 and the CT scan revealed a proximal CBD stone without ductal dilation.  She did see Surgery in follow up for her gallstone pancreatitis, but she needed Cardiac clearance, which did not occur.  She feels better at this time.  Past Medical History  Diagnosis Date  . Bilateral knee pain   . Depression   . Hypertension   . Low back pain   . Osteoarthritis   . Osteoporosis   . Hypothyroidism   . Pneumonia     hx of PNA 2013  . Incontinence   . Paroxysmal atrial fibrillation 08/20/2014    Past Surgical History  Procedure Laterality Date  . Vaginal hysterectomy      with unilateral oophorectomy  . Appendectomy      History reviewed. No pertinent family history.  Social History:  reports that she has never smoked. She has never used smokeless tobacco. She reports that she does not drink alcohol or use illicit drugs.  Allergies: No Known Allergies  Medications:  Scheduled: . sodium chloride   Intravenous STAT  . Chlorhexidine Gluconate Cloth  6 each Topical Q0600  . heparin  5,000 Units Subcutaneous 3 times per day  . mupirocin ointment  1 application Nasal BID  . sodium chloride  3 mL Intravenous Q12H   Continuous: . sodium chloride 250 mL/hr at 09/27/14 0725    Results for orders placed or performed during the hospital encounter of 09/27/14 (from the past 24 hour(s))  CBC with Differential     Status: Abnormal   Collection Time: 09/27/14  1:47 AM  Result Value Ref Range   WBC 15.1 (H) 4.0 - 10.5 K/uL   RBC 4.74 3.87 - 5.11 MIL/uL   Hemoglobin 14.0 12.0 - 15.0  g/dL   HCT 16.144.2 09.636.0 - 04.546.0 %   MCV 93.2 78.0 - 100.0 fL   MCH 29.5 26.0 - 34.0 pg   MCHC 31.7 30.0 - 36.0 g/dL   RDW 40.914.3 81.111.5 - 91.415.5 %   Platelets 181 150 - 400 K/uL   Neutrophils Relative % 80 (H) 43 - 77 %   Neutro Abs 11.9 (H) 1.7 - 7.7 K/uL   Lymphocytes Relative 10 (L) 12 - 46 %   Lymphs Abs 1.5 0.7 - 4.0 K/uL   Monocytes Relative 10 3 - 12 %   Monocytes Absolute 1.6 (H) 0.1 - 1.0 K/uL   Eosinophils Relative 0 0 - 5 %   Eosinophils Absolute 0.1 0.0 - 0.7 K/uL   Basophils Relative 0 0 - 1 %   Basophils Absolute 0.0 0.0 - 0.1 K/uL  Comprehensive metabolic panel     Status: Abnormal   Collection Time: 09/27/14  1:47 AM  Result Value Ref Range   Sodium 139 135 - 145 mmol/L   Potassium 4.2 3.5 - 5.1 mmol/L   Chloride 103 101 - 111 mmol/L   CO2 24 22 - 32 mmol/L   Glucose, Bld 116 (H) 65 - 99 mg/dL   BUN 20 6 - 20 mg/dL   Creatinine, Ser 7.821.19 (H) 0.44 - 1.00 mg/dL   Calcium 9.8 8.9 - 95.610.3 mg/dL  Total Protein 7.7 6.5 - 8.1 g/dL   Albumin 3.9 3.5 - 5.0 g/dL   AST 161 (H) 15 - 41 U/L   ALT 90 (H) 14 - 54 U/L   Alkaline Phosphatase 118 38 - 126 U/L   Total Bilirubin 1.1 0.3 - 1.2 mg/dL   GFR calc non Af Amer 43 (L) >60 mL/min   GFR calc Af Amer 50 (L) >60 mL/min   Anion gap 12 5 - 15  Lipase, blood     Status: Abnormal   Collection Time: 09/27/14  1:47 AM  Result Value Ref Range   Lipase >3000 (H) 22 - 51 U/L  Urinalysis, Routine w reflex microscopic     Status: Abnormal   Collection Time: 09/27/14  4:16 AM  Result Value Ref Range   Color, Urine YELLOW YELLOW   APPearance TURBID (A) CLEAR   Specific Gravity, Urine 1.020 1.005 - 1.030   pH 5.0 5.0 - 8.0   Glucose, UA NEGATIVE NEGATIVE mg/dL   Hgb urine dipstick TRACE (A) NEGATIVE   Bilirubin Urine SMALL (A) NEGATIVE   Ketones, ur 15 (A) NEGATIVE mg/dL   Protein, ur NEGATIVE NEGATIVE mg/dL   Urobilinogen, UA 0.2 0.0 - 1.0 mg/dL   Nitrite NEGATIVE NEGATIVE   Leukocytes, UA SMALL (A) NEGATIVE  Urine microscopic-add  on     Status: Abnormal   Collection Time: 09/27/14  4:16 AM  Result Value Ref Range   Squamous Epithelial / LPF RARE RARE   WBC, UA 3-6 <3 WBC/hpf   RBC / HPF 0-2 <3 RBC/hpf   Bacteria, UA FEW (A) RARE   Casts HYALINE CASTS (A) NEGATIVE   Crystals CA OXALATE CRYSTALS (A) NEGATIVE  MRSA PCR Screening     Status: Abnormal   Collection Time: 09/27/14  5:03 AM  Result Value Ref Range   MRSA by PCR POSITIVE (A) NEGATIVE  Glucose, capillary     Status: Abnormal   Collection Time: 09/27/14  8:05 AM  Result Value Ref Range   Glucose-Capillary 107 (H) 65 - 99 mg/dL   Comment 1 Notify RN      Ct Abdomen Pelvis W Contrast  09/27/2014   CLINICAL DATA:  Abdominal pain  EXAM: CT ABDOMEN AND PELVIS WITH CONTRAST  TECHNIQUE: Multidetector CT imaging of the abdomen and pelvis was performed using the standard protocol following bolus administration of intravenous contrast.  CONTRAST:  80mL OMNIPAQUE IOHEXOL 300 MG/ML  SOLN  COMPARISON:  06/20/2014  FINDINGS: BODY WALL: No contributory findings.  LOWER CHEST: No contributory findings.  ABDOMEN/PELVIS:  Liver: 14 mm lesion in the medial left liver has mildly indistinct margins but is stable since 2008. A tiny low-density in the posterior right liver on image 18 is also stable and considered incidental. Possible mild hepatic steatosis.  Biliary: Cholelithiasis. There is a newly seen stone in the proximal common bile duct. No CBD dilation. No evidence of cholecystitis.  Pancreas: Peripancreatic edema and diffuse pancreatic expansion. No necrosis or organized collection. No vascular complication.  Spleen: Unremarkable.  Adrenals: Unremarkable.  Kidneys and ureters: No hydronephrosis or stone.  Bladder: Chronic mild bladder wall thickening and indistinctness.  Reproductive: Hysterectomy and oophorectomies. Marked pelvic floor laxity.  Bowel: No obstruction. The appendix is distended with stool, but there is no acute inflammatory change. Extensive distal colonic  diverticulosis.  Retroperitoneum: No mass or adenopathy.  Peritoneum: No ascites or pneumoperitoneum.  Vascular: No acute abnormality.  OSSEOUS: No acute abnormalities.  IMPRESSION: 1. Interstitial edematous pancreatitis, likely biliary given  cholelithiasis and choledocholithiasis. 2. Chronic findings are noted above.   Electronically Signed   By: Marnee Spring M.D.   On: 09/27/2014 03:31    ROS:  As stated above in the HPI otherwise negative.  Blood pressure 165/79, pulse 81, temperature 98 F (36.7 C), temperature source Oral, resp. rate 16, height  (1.499 m), weight 96.163 kg (212 lb), SpO2 99 %.    PE: Gen: NAD, Alert and Oriented HEENT:  [place]/AT, EOMI Neck: Supple, no LAD Lungs: CTA Bilaterally CV: RRR without M/G/R ABM: Soft, tender in the epigastric region, +BS Ext: No C/C/E  Assessment/Plan: 1) Gallstone pancreatitis. 2) Cholelithiasis.   I discussed the risks of bleeding, infection, perforation, and pancreatitis with the patient and agrees to undergo the procedure.  Plan: 1) ERCP today.  Volney Reierson D 09/27/2014, 11:05 AM

## 2014-09-27 NOTE — Op Note (Signed)
[facility] [address] [place], [id number]   ERCP PROCEDURE REPORT        EXAM DATE: 09/27/2014  PATIENT NAME:          Lisa Haynes, Lisa Haynes          MR #: [id number] BIRTHDATE:       31-Mar-1941     VISIT #:     [id number] ATTENDING:     Jeani HawkingPatrick Tobechukwu Emmick, Lisa Haynes     STATUS:     outpatient ASSISTANT:      Waunita SchoonerHoneycutt, Elizabeth and Olene CravenAiken, Jacqueline   INDICATIONS:  The patient is a 74 yr old female here for an ERCP due to choledocholithiasis. PROCEDURE PERFORMED:     ERCP with removal of calculus/calculi  MEDICATIONS:     General Anesthesia  CONSENT: The patient understands the risks and benefits of the procedure and understands that these risks include, but are not limited to: sedation, allergic reaction, infection, perforation and/or bleeding. Alternative means of evaluation and treatment include, among others: physical exam, x-rays, and/or surgical intervention. The patient elects to proceed with this endoscopic procedure.  DESCRIPTION OF PROCEDURE: During intra-op preparation period all mechanical & medical equipment was checked for proper function. Hand hygiene and appropriate measures for infection prevention was taken. After the risks, benefits and alternatives of the procedure were thoroughly explained, Informed was verified, confirmed and timeout was successfully executed by the treatment team. With the patient in left semi-prone position, medications were administered intravenously.The Pentax Ercp Scope [id number] was passed from the mouth into the esophagus and further advanced from the esophagus into the stomach. From stomach scope was directed to the second portion of the duodenum.  Major papilla was aligned with the duodenoscope. The scope position was confirmed fluoroscopically. Rest of the findings/therapeutics are given below. The scope was then completely withdrawn from the patient and the procedure completed. The pulse, BP, and O2  saturation were monitored and documented by the physician and the nursing staff throughout the entire procedure. The patient was cared for as planned according to standard protocol. The patient was then discharged to recovery in stable condition and with appropriate post procedure care. Estimated blood loss is zero unless otherwise noted in this procedure report.  The duodenoscope was advanced to the second portion of the duodenum without any difficulty.  There was evidence of spontaneous drinage of bile.  The CBD was easily cannulated during the first attempt and the guidewire was secured in the right intrahepatic duct. Contrast injection revealed a dilated CBD at approximately 8-9 mm and the possibility of a small radiolucency in the proximal CBD.  A 10 - 12 mm sphincterotomy was created and there was excellent drainage of bile.  During the second balloon sweep of the CBD a small oval black stone was extracted (image 2).  Two more sweeps were performed and a final occlusion cholangiogram was negative for any retained stones.  Minor mucosal bleeding was noted secondary to trauma of the extraction balloon through the sphincterotomy, but this arrested with observation (image 4).  The wires, extraction balloon, and duodenoscope were removed.    ADVERSE EVENT:     There were no complications. IMPRESSIONS:     1) Choledocholithiasis s/p sphincterotomy and stone extraction.  RECOMMENDATIONS:     1) Lap chole per Surgery. 2) Signing off.  Call with any questions.  REPEAT EXAM:  ___________________________________ Lisa HawkingPatrick Malarie Tappen, Lisa Haynes eSigned:  Jeani HawkingPatrick Emine Lopata, Lisa Haynes 09/27/2014 2:08 PM  cc:  CPT CODES: ICD9  CODES:  The ICD and CPT codes recommended by this software are interpretations from the data that the clinical staff has captured with the software.  The verification of the translation of this report to the ICD and CPT codes and modifiers is the sole responsibility of the health  care institution and practicing physician where this report was generated.  [employer] will not be held responsible for the validity of the ICD and CPT codes included on this report.  AMA assumes no liability for data contained or not contained herein. CPT is a Publishing rights manager of the [employer].   PATIENT NAME:  Lisa Haynes, Lisa Haynes MR#: [id number]

## 2014-09-27 NOTE — Progress Notes (Signed)
Subjective: No acute events overnight. She reports minimal improvement of her abdominal pain. Last BM last night. Remains NPO.  Objective: Vital signs in last 24 hours: Filed Vitals:   09/27/14 0245 09/27/14 0330 09/27/14 0415 09/27/14 0447  BP: 122/107 153/56 131/103 165/79  Pulse: 67 69 76 81  Temp:    98 F (36.7 C)  TempSrc:    Oral  Resp:   16 16  Height:     (1.499 m)  Weight:    212 lb (96.163 kg)  SpO2: 96% 92% 99% 99%   Weight change:   Intake/Output Summary (Last 24 hours) at 09/27/14 0716 Last data filed at 09/27/14 0558  Gross per 24 hour  Intake 858.33 ml  Output      0 ml  Net 858.33 ml   Gen: NAD HEENT: Atraumatic, PERRL, EOMI, sclerae anicteric, moist mucous membranes Heart: Regular rate and rhythm, normal S1 S2, no murmurs, rubs, or gallops Lungs: Clear to auscultation bilaterally, respirations unlabored Abd: Well healed surgical scar, soft, tender diffusely, non-distended but obese, + bowel sounds, no hepatosplenomegaly Ext: No edema or cyanosis Neuro: Alert and oriented x3, no gross deficits  Lab Results: Basic Metabolic Panel:  Recent Labs Lab 09/27/14 0147  NA 139  K 4.2  CL 103  CO2 24  GLUCOSE 116*  BUN 20  CREATININE 1.19*  CALCIUM 9.8   Liver Function Tests:  Recent Labs Lab 09/27/14 0147  AST 147*  ALT 90*  ALKPHOS 118  BILITOT 1.1  PROT 7.7  ALBUMIN 3.9    Recent Labs Lab 09/27/14 0147  LIPASE >3000*    CBC:  Recent Labs Lab 09/27/14 0147  WBC 15.1*  NEUTROABS 11.9*  HGB 14.0  HCT 44.2  MCV 93.2  PLT 181   Urinalysis:  Recent Labs Lab 09/27/14 0416  COLORURINE YELLOW  LABSPEC 1.020  PHURINE 5.0  GLUCOSEU NEGATIVE  HGBUR TRACE*  BILIRUBINUR SMALL*  KETONESUR 15*  PROTEINUR NEGATIVE  UROBILINOGEN 0.2  NITRITE NEGATIVE  LEUKOCYTESUR SMALL*   Micro Results: No results found for this or any previous visit (from the past 240 hour(s)).   Studies/Results: Ct Abdomen Pelvis W  Contrast  09/27/2014   CLINICAL DATA:  Abdominal pain  EXAM: CT ABDOMEN AND PELVIS WITH CONTRAST  TECHNIQUE: Multidetector CT imaging of the abdomen and pelvis was performed using the standard protocol following bolus administration of intravenous contrast.  CONTRAST:  80mL OMNIPAQUE IOHEXOL 300 MG/ML  SOLN  COMPARISON:  06/20/2014  FINDINGS: BODY WALL: No contributory findings.  LOWER CHEST: No contributory findings.  ABDOMEN/PELVIS:  Liver: 14 mm lesion in the medial left liver has mildly indistinct margins but is stable since 2008. A tiny low-density in the posterior right liver on image 18 is also stable and considered incidental. Possible mild hepatic steatosis.  Biliary: Cholelithiasis. There is a newly seen stone in the proximal common bile duct. No CBD dilation. No evidence of cholecystitis.  Pancreas: Peripancreatic edema and diffuse pancreatic expansion. No necrosis or organized collection. No vascular complication.  Spleen: Unremarkable.  Adrenals: Unremarkable.  Kidneys and ureters: No hydronephrosis or stone.  Bladder: Chronic mild bladder wall thickening and indistinctness.  Reproductive: Hysterectomy and oophorectomies. Marked pelvic floor laxity.  Bowel: No obstruction. The appendix is distended with stool, but there is no acute inflammatory change. Extensive distal colonic diverticulosis.  Retroperitoneum: No mass or adenopathy.  Peritoneum: No ascites or pneumoperitoneum.  Vascular: No acute abnormality.  OSSEOUS: No acute abnormalities.  IMPRESSION: 1. Interstitial edematous  pancreatitis, likely biliary given cholelithiasis and choledocholithiasis. 2. Chronic findings are noted above.   Electronically Signed   By: Marnee SpringJonathon  Watts M.D.   On: 09/27/2014 03:31   Medications: I have reviewed the patient's current medications. Scheduled Meds: . sodium chloride   Intravenous STAT  . heparin  5,000 Units Subcutaneous 3 times per day  . sodium chloride  3 mL Intravenous Q12H   Continuous  Infusions: . sodium chloride     PRN Meds:.morphine injection, ondansetron **OR** ondansetron (ZOFRAN) IV Assessment/Plan:  Acute gallstone pancreatitis: CT Abdomen/Pelvis with contrast 09/27/2014 demonstrates interstitial edematous pancreatitis with cholelithiasis and choledocholithiasis -Consult general surgery and gastroenterology -NS @ 250 cc/hr -NPO -morphine 1-2 mg iv q4hprn -zofran 4 mg iv q6hprn  Paroxysmal atrial fibrillation: At home she is supposed to be on eliquis 5 mg bid but she has not taken eliquis in over two weeks.  -hold eliquis -cardiac monitoring  AKI: Presenting creatinine 1.19 up from baseline ~0.9. Likely dehydrated in setting of poor po intake and pancreatitis -hydrate as above -cont to monitor -hold ARB  HTN: BP in ED 120s-150s/50s-100s. At home she is on lisinopril 10 mg daily, ASA 81 mg daily, simvastatin 10 mg qhs. -hold antihypertensives while NPO, also would hold ARB in setting of AKI  HLD: Last lipid profile 06/2014 with cholesterol 121, LDL 74, HDL 32, and triglyceride 77. At home she is on simvastatin 10 mg qhs -hold statin while NPO  FEN:  -NPO -NS @ 250 cc/hr  VTE PPx: heparin 5000 u [place] tid  Dispo: Disposition is deferred at this time, awaiting improvement of current medical problems.  Anticipated discharge in approximately 2-3 day(s).   The patient does have a current PCP Baltazar Apo(Samaya J Qureshi, MD) and [facility].  The patient does not have transportation limitations that hinder transportation to clinic appointments.  .Services Needed at time of discharge: Y = Yes, Blank = No PT:   OT:   RN:   Equipment:   Other:     LOS: 0 days   Lora PaulaJennifer T Jaquelinne Glendening, MD 09/27/2014, 7:16 AM

## 2014-09-27 NOTE — Anesthesia Postprocedure Evaluation (Signed)
  Anesthesia Post-op Note  Patient: Lisa Haynes  Procedure(s) Performed: Procedure(s): ENDOSCOPIC RETROGRADE CHOLANGIOPANCREATOGRAPHY (ERCP) (N/A)  Patient Location: PACU  Anesthesia Type:General  Level of Consciousness: awake  Airway and Oxygen Therapy: Patient Spontanous Breathing  Post-op Pain: mild  Post-op Assessment: Post-op Vital signs reviewed  Post-op Vital Signs: Reviewed  Last Vitals:  Filed Vitals:   09/27/14 1516  BP: 154/71  Pulse: 73  Temp: 36.4 C  Resp: 18    Complications: No apparent anesthesia complications

## 2014-09-27 NOTE — Anesthesia Procedure Notes (Signed)
Procedure Name: Intubation Date/Time: 09/27/2014 1:27 PM Performed by: Ferol LuzMCMILLEN, Deona Novitski L Pre-anesthesia Checklist: Patient identified, Emergency Drugs available, Suction available, Patient being monitored and Timeout performed Patient Re-evaluated:Patient Re-evaluated prior to inductionOxygen Delivery Method: Circle system utilized Preoxygenation: Pre-oxygenation with 100% oxygen Intubation Type: IV induction and Cricoid Pressure applied Ventilation: Mask ventilation without difficulty Laryngoscope Size: Mac and 3 Grade View: Grade I Tube type: Oral Tube size: 7.5 mm Number of attempts: 1 Airway Equipment and Method: Stylet Placement Confirmation: ETT inserted through vocal cords under direct vision,  positive ETCO2 and breath sounds checked- equal and bilateral Secured at: 19 cm Tube secured with: Tape Dental Injury: Teeth and Oropharynx as per pre-operative assessment

## 2014-09-27 NOTE — Consult Note (Signed)
CARDIOLOGY CONSULT NOTE   Patient ID: Lisa Haynes MRN: [id number], DOB/AGE: 1940/07/18   Admit date: 09/27/2014 Date of Consult: 09/27/2014   Primary Physician: Darden Palmer, MD Primary Cardiologist: new (previously was supposed to see Dr. Anne Fu as a pre-op consult)  Pt. Profile  74 year old Caucasian female with past medical history of HTN, HLD, GERD, history of pancreatitis, morbid obesity, hypothyroidism and PAF present with gallstone pancreatitis. S/p ERCP with sphincterotomy and stone retrieval on 5/27. Surgery planning for lap chole, cardiology consulted for preop eval  Problem List  Past Medical History  Diagnosis Date  . Bilateral knee pain   . Depression   . Hypertension   . Low back pain   . Osteoarthritis   . Osteoporosis   . Hypothyroidism   . Pneumonia     hx of PNA 2013  . Incontinence   . Paroxysmal atrial fibrillation 08/20/2014    Past Surgical History  Procedure Laterality Date  . Vaginal hysterectomy      with unilateral oophorectomy  . Appendectomy       Allergies  No Known Allergies  HPI   The patient is a 74 year old Caucasian female with PMH of HTN, HLD, GERD, history of pancreatitis, morbid obesity, hypothyroidism and PAF. She was initially diagnosed with paroxysmal atrial fibrillation in February 2016. However at the time, although her CHA2DS2-Vasc score was 8 (age, HTN, female), she did not wish to start on systemic anticoagulation due to concern of bleeding. She was diagnosed with acute pancreatitis which was confirmed by CT and was eventually discharged after improvement. She had echocardiogram on 06/21/2014 showed EF 60%, no regional wall motion abnormality. She was seen by internal medicine as outpatient in April 2016 at which time she was started on eliquis. According to the patient, she has chronic dyspnea on exertion and has also been experiencing chest pain with exertion 2-3 times per month for a long time. She denies any prior  cardiac workup. However her exertional chest pain only occur on some occasion but not others. She denies any recent fever, chill, orthopnea or paroxysmal nocturnal dyspnea. She was seen by surgery as outpatient and was planning for outpatient cardiology consultation (with Dr. Anne Fu) for preoperative clearance prior to lap chole.  Unfortunately, prior to her scheduled cardiac consult, she presented to [facility] on 09/27/2014 after recurrent abdominal discomfort. Her lipase was greater than 3000. Her white blood cell count was elevated at 15.1. CT of abdomen was concerning for gallstone pancreatitis. She underwent ERCP in the afternoon of 09/27/2014 with sphincterotomy and stone retrieval. Cardiology has been consulted for inpatient preoperative evaluation prior to surgery early next week.   Of note, apparently patient also discontinued her eliquis 2 weeks prior to arrival after she was told she will need to come off eliquis before surgery.   Inpatient Medications  . Chlorhexidine Gluconate Cloth  6 each Topical Q0600  . heparin  5,000 Units Subcutaneous 3 times per day  . mupirocin ointment  1 application Nasal BID  . sodium chloride  3 mL Intravenous Q12H    Family History Family History  Problem Relation Age of Onset  . Heart disease Brother 2  . Heart disease Mother     onset 72s     Social History History   Social History  . Marital Status: Widowed    Spouse Name: N/A  . Number of Children: N/A  . Years of Education: N/A   Occupational History  . Retired    Chief Executive Officer  History Main Topics  . Smoking status: Never Smoker   . Smokeless tobacco: Never Used  . Alcohol Use: No  . Drug Use: No  . Sexual Activity: Not on file   Other Topics Concern  . Not on file   Social History Narrative   Retired: Motel work   Divorced    Regular exercise- no   Lives alone     Review of Systems  General:  No chills, fever, night sweats or weight changes.  Cardiovascular:  No  orthopnea, palpitations, paroxysmal nocturnal dyspnea. + chest pain, dyspnea on exertion, chronic LE edema controlled with lasix Dermatological: No rash, lesions/masses Respiratory: No cough, dyspnea Urologic: No hematuria, dysuria Abdominal:   No diarrhea, bright red blood per rectum, melena, or hematemesis +abdominal pain, improved.  Neurologic:  No visual changes, wkns, changes in mental status. All other systems reviewed and are otherwise negative except as noted above.  Physical Exam  Blood pressure 154/71, pulse 73, temperature 97.5 F (36.4 C), temperature source Oral, resp. rate 18, height 4\' 11"  (1.499 m), weight 202 lb (91.627 kg), SpO2 97 %.  General: Pleasant, NAD Psych: Normal affect. Neuro: Alert and oriented X 3. Moves all extremities spontaneously. HEENT: Normal  Neck: Supple without bruits or JVD. Lungs:  Resp regular and unlabored, CTA. Heart: RRR no s3, s4, or murmurs. Abdomen: Soft. +mildly tender.   Extremities: No clubbing, cyanosis. DP/PT/Radials 2+ and equal bilaterally. 1+nonpitting edema  Labs  No results for input(s): CKTOTAL, CKMB, TROPONINI in the last 72 hours. Lab Results  Component Value Date   WBC 15.1* 09/27/2014   HGB 14.0 09/27/2014   HCT 44.2 09/27/2014   MCV 93.2 09/27/2014   PLT 181 09/27/2014     Recent Labs Lab 09/27/14 0147  NA 139  K 4.2  CL 103  CO2 24  BUN 20  CREATININE 1.19*  CALCIUM 9.8  PROT 7.7  BILITOT 1.1  ALKPHOS 118  ALT 90*  AST 147*  GLUCOSE 116*   Lab Results  Component Value Date   CHOL 121 06/21/2014   HDL 32* 06/21/2014   LDLCALC 74 06/21/2014   TRIG 77 06/21/2014   No results found for: DDIMER  Radiology/Studies  Ct Abdomen Pelvis W Contrast  09/27/2014   CLINICAL DATA:  Abdominal pain  EXAM: CT ABDOMEN AND PELVIS WITH CONTRAST  TECHNIQUE: Multidetector CT imaging of the abdomen and pelvis was performed using the standard protocol following bolus administration of intravenous contrast.   CONTRAST:  80mL OMNIPAQUE IOHEXOL 300 MG/ML  SOLN  COMPARISON:  06/20/2014  FINDINGS: BODY WALL: No contributory findings.  LOWER CHEST: No contributory findings.  ABDOMEN/PELVIS:  Liver: 14 mm lesion in the medial left liver has mildly indistinct margins but is stable since 2008. A tiny low-density in the posterior right liver on image 18 is also stable and considered incidental. Possible mild hepatic steatosis.  Biliary: Cholelithiasis. There is a newly seen stone in the proximal common bile duct. No CBD dilation. No evidence of cholecystitis.  Pancreas: Peripancreatic edema and diffuse pancreatic expansion. No necrosis or organized collection. No vascular complication.  Spleen: Unremarkable.  Adrenals: Unremarkable.  Kidneys and ureters: No hydronephrosis or stone.  Bladder: Chronic mild bladder wall thickening and indistinctness.  Reproductive: Hysterectomy and oophorectomies. Marked pelvic floor laxity.  Bowel: No obstruction. The appendix is distended with stool, but there is no acute inflammatory change. Extensive distal colonic diverticulosis.  Retroperitoneum: No mass or adenopathy.  Peritoneum: No ascites or pneumoperitoneum.  Vascular: No acute  abnormality.  OSSEOUS: No acute abnormalities.  IMPRESSION: 1. Interstitial edematous pancreatitis, likely biliary given cholelithiasis and choledocholithiasis. 2. Chronic findings are noted above.   Electronically Signed   By: Marnee Spring M.D.   On: 09/27/2014 03:31   Dg Ercp Biliary & Pancreatic Ducts  09/27/2014   CLINICAL DATA:  Choledocholithiasis.  EXAM: ERCP  TECHNIQUE: Multiple spot images obtained with the fluoroscopic device and submitted for interpretation post-procedure.  COMPARISON:  CT of the abdomen on 09/27/2014  FINDINGS: Imaging during the procedure obtained with a C-arm demonstrates endoscopic cannulation of the common bile duct with cholangiogram demonstrating potential subtle filling defects in the proximal common bile duct. A balloon  sweep maneuver was performed.  IMPRESSION: ERCP demonstrates possible calculus in the proximal common bile duct. A balloon sweep extraction maneuver was performed.  These images were submitted for radiologic interpretation only. Please see the procedural report for the amount of contrast and the fluoroscopy time utilized.   Electronically Signed   By: Irish Lack M.D.   On: 09/27/2014 14:14    ECG  NSR without significant ST-T wave changes  ASSESSMENT AND PLAN  1. Preop clearance for lap chole after gallstone pancreatitis  - some exertional sx at home, however not consistent. Echo result reassuring  - Discussed with MD, will evaluate with Steffanie Dunn, would not pursue invasive workup unless high risk study  2. H/o PAF - currently in NSR  - CHA2DS2-Vasc score 4-5 (age, sex, HTN +/- h/o HF)   - will need to resume eliquis when cleared by surgery after lap chole  3. Gallstone pancreatitis: s/p ERCP, sphincterotomy and stone extraction by GI 5/27 4. HTN 5. HLD 6. GERD 7. Morbid obesity 8. hypothyroidism    Signed, Azalee Course, PA-C 09/27/2014, 4:39 PM   Patient seen and examined with Azalee Course, PA-C. We discussed all aspects of the encounter. I agree with the assessment and plan as stated above.  She has several CRFs and mobility is quite limited at baseline. Has tolerated ERCP well and I suspect she would tolerate lap chole but given CRFs and poor functional capacity recommend lexiscan Myoview prior to surgery. Would cath only if high-risk results. Discussed with patient and her grandson. Resume Eliquis as soon as possible after surgery.    Bensimhon, Daniel,MD 7:39 PM

## 2014-09-27 NOTE — Anesthesia Preprocedure Evaluation (Addendum)
Anesthesia Evaluation  Patient identified by MRN, date of birth, ID band Patient awake    Reviewed: Allergy & Precautions, NPO status , Patient's Chart, lab work & pertinent test results  Airway Mallampati: II  TM Distance: >3 FB Neck ROM: Full    Dental   Pulmonary neg pulmonary ROS,  breath sounds clear to auscultation        Cardiovascular hypertension, Pt. on medications Rhythm:Regular Rate:Normal  ECHO 2016 EF 60%, EKG 09/2014 OK   Neuro/Psych Depression    GI/Hepatic GERD-  Medicated,Pancreatitis and cholelithiasis   Endo/Other  Hypothyroidism (replacement)   Renal/GU      Musculoskeletal   Abdominal   Peds  Hematology 14/44, WBC 15K   Anesthesia Other Findings   Reproductive/Obstetrics                           Anesthesia Physical Anesthesia Plan  ASA: III  Anesthesia Plan: General   Post-op Pain Management:    Induction: Intravenous  Airway Management Planned: Oral ETT  Additional Equipment:   Intra-op Plan:   Post-operative Plan: Extubation in OR  Informed Consent: I have reviewed the patients History and Physical, chart, labs and discussed the procedure including the risks, benefits and alternatives for the proposed anesthesia with the patient or authorized representative who has indicated his/her understanding and acceptance.     Plan Discussed with: CRNA, Anesthesiologist and Surgeon  Anesthesia Plan Comments:        Anesthesia Quick Evaluation

## 2014-09-27 NOTE — Consult Note (Signed)
Lisa Haynes 04/11/1941  [id number].   Requesting MD: Dr. Gilles Chiquito Chief Complaint/Reason for Consult: gallstone pancreatitis HPI: This is a 74 yo white female who was admitted in February with gallstone pancreatitis.  She was not seen by surgery at that time.  She was in new onset a fib and started on eliquis.  She was referred to Dr. Donne Hazel as an outpatient and saw him to set up lap chole.  She stopped her eliquis apparently at that time and was referred to cardiology, whom she hasn't seen yet.  She was admitted overnight as she developed severe abdominal pain and nausea vomiting last night at 11:30pm.  She was brought to the [facility] where she was admitted with gallstone pancreatitis. She has choledocholithiasis seen on her CT scan along with pancreatitis.  Her lipase is >3000.  WBC 15K.  We have been asked to see the patient for further recommendations.  ROS : Please see HPI, otherwise negative   History reviewed. No pertinent family history.  Past Medical History  Diagnosis Date  . Bilateral knee pain   . Depression   . Hypertension   . Low back pain   . Osteoarthritis   . Osteoporosis   . Hypothyroidism   . Pneumonia     hx of PNA 2013  . Incontinence   . Paroxysmal atrial fibrillation 08/20/2014    Past Surgical History  Procedure Laterality Date  . Vaginal hysterectomy      with unilateral oophorectomy  . Appendectomy      Social History:  reports that she has never smoked. She has never used smokeless tobacco. She reports that she does not drink alcohol or use illicit drugs.  Allergies: No Known Allergies  Medications Prior to Admission  Medication Sig Dispense Refill  . acetaminophen (TYLENOL) 325 MG tablet Take 2 tablets (650 mg total) by mouth every 6 (six) hours as needed for pain. 60 tablet 3  . aspirin EC 81 MG tablet Take 81 mg by mouth daily.    . Calcium Carbonate-Vitamin D (CALCARB 600/D) 600-400 MG-UNIT per tablet Take 1 tablet by mouth 3 (three) times  daily with meals. (Patient taking differently: Take 1 tablet by mouth daily. ) 30 tablet 2  . lisinopril (PRINIVIL,ZESTRIL) 10 MG tablet TAKE 1 TABLET BY MOUTH EVERY DAY 90 tablet 0  . sertraline (ZOLOFT) 50 MG tablet Take 1 tablet (50 mg total) by mouth at bedtime. (Patient taking differently: Take 25 mg by mouth at bedtime. ) 30 tablet 1  . simvastatin (ZOCOR) 10 MG tablet TAKE 1 TABLET BY MOUTH AT BEDTIME 90 tablet 5  . apixaban (ELIQUIS) 5 MG TABS tablet Take 1 tablet (5 mg total) by mouth 2 (two) times daily. (Patient not taking: Reported on 09/27/2014) 60 tablet 1  . Multiple Vitamins-Minerals (MULTIVITAMIN WITH MINERALS) tablet Take 1 tablet by mouth daily.    Marland Kitchen omeprazole (PRILOSEC) 20 MG capsule Take 1 capsule (20 mg total) by mouth daily. (Patient not taking: Reported on 09/27/2014) 30 capsule 0    Blood pressure 165/79, pulse 81, temperature 98 F (36.7 C), temperature source Oral, resp. rate 16, height _0  (1.499 m), weight 96.163 kg (212 lb), SpO2 99 %. Physical Exam: General: pleasant, obese white female who is laying in bed in NAD HEENT: head is normocephalic, atraumatic.  Sclera are noninjected.  PERRL.  Ears and nose without any masses or lesions.  Mouth is pink and moist Heart: regular, rate, and rhythm.  Normal s1,s2. No obvious murmurs,  gallops, or rubs noted.  Palpable radial and pedal pulses bilaterally Lungs: CTAB, no wheezes, rhonchi, or rales noted.  Respiratory effort nonlabored Abd: soft, diffuse upper abdomen tenderness, but greatest in epigastrium and LUQ, obese, +BS, no masses, hernias, or organomegaly.  Lower midline scar noted from hysterectomy MS: all 4 extremities are symmetrical with no cyanosis, clubbing, or edema.  Skin: warm and dry with no masses, lesions, or rashes, several actinic keratoses noted   Psych: A&Ox3 with an appropriate affect, but seems forgetful.  She didn't remember that she had been hospitalized before for this same problem    Results  for orders placed or performed during the hospital encounter of 09/27/14 (from the past 48 hour(s))  CBC with Differential     Status: Abnormal   Collection Time: 09/27/14  1:47 AM  Result Value Ref Range   WBC 15.1 (H) 4.0 - 10.5 K/uL   RBC 4.74 3.87 - 5.11 MIL/uL   Hemoglobin 14.0 12.0 - 15.0 g/dL   HCT 44.2 36.0 - 46.0 %   MCV 93.2 78.0 - 100.0 fL   MCH 29.5 26.0 - 34.0 pg   MCHC 31.7 30.0 - 36.0 g/dL   RDW 14.3 11.5 - 15.5 %   Platelets 181 150 - 400 K/uL   Neutrophils Relative % 80 (H) 43 - 77 %   Neutro Abs 11.9 (H) 1.7 - 7.7 K/uL   Lymphocytes Relative 10 (L) 12 - 46 %   Lymphs Abs 1.5 0.7 - 4.0 K/uL   Monocytes Relative 10 3 - 12 %   Monocytes Absolute 1.6 (H) 0.1 - 1.0 K/uL   Eosinophils Relative 0 0 - 5 %   Eosinophils Absolute 0.1 0.0 - 0.7 K/uL   Basophils Relative 0 0 - 1 %   Basophils Absolute 0.0 0.0 - 0.1 K/uL  Comprehensive metabolic panel     Status: Abnormal   Collection Time: 09/27/14  1:47 AM  Result Value Ref Range   Sodium 139 135 - 145 mmol/L   Potassium 4.2 3.5 - 5.1 mmol/L   Chloride 103 101 - 111 mmol/L   CO2 24 22 - 32 mmol/L   Glucose, Bld 116 (H) 65 - 99 mg/dL   BUN 20 6 - 20 mg/dL   Creatinine, Ser 1.19 (H) 0.44 - 1.00 mg/dL   Calcium 9.8 8.9 - 10.3 mg/dL   Total Protein 7.7 6.5 - 8.1 g/dL   Albumin 3.9 3.5 - 5.0 g/dL   AST 147 (H) 15 - 41 U/L   ALT 90 (H) 14 - 54 U/L   Alkaline Phosphatase 118 38 - 126 U/L   Total Bilirubin 1.1 0.3 - 1.2 mg/dL   GFR calc non Af Amer 43 (L) >60 mL/min   GFR calc Af Amer 50 (L) >60 mL/min    Comment: (NOTE) The eGFR has been calculated using the CKD EPI equation. This calculation has not been validated in all clinical situations. eGFR's persistently <60 mL/min signify possible Chronic Kidney Disease.    Anion gap 12 5 - 15  Lipase, blood     Status: Abnormal   Collection Time: 09/27/14  1:47 AM  Result Value Ref Range   Lipase >3000 (H) 22 - 51 U/L    Comment: RESULTS CONFIRMED BY MANUAL DILUTION   Urinalysis, Routine w reflex microscopic     Status: Abnormal   Collection Time: 09/27/14  4:16 AM  Result Value Ref Range   Color, Urine YELLOW YELLOW   APPearance TURBID (A) CLEAR  Specific Gravity, Urine 1.020 1.005 - 1.030   pH 5.0 5.0 - 8.0   Glucose, UA NEGATIVE NEGATIVE mg/dL   Hgb urine dipstick TRACE (A) NEGATIVE   Bilirubin Urine SMALL (A) NEGATIVE   Ketones, ur 15 (A) NEGATIVE mg/dL   Protein, ur NEGATIVE NEGATIVE mg/dL   Urobilinogen, UA 0.2 0.0 - 1.0 mg/dL   Nitrite NEGATIVE NEGATIVE   Leukocytes, UA SMALL (A) NEGATIVE  Urine microscopic-add on     Status: Abnormal   Collection Time: 09/27/14  4:16 AM  Result Value Ref Range   Squamous Epithelial / LPF RARE RARE   WBC, UA 3-6 <3 WBC/hpf   RBC / HPF 0-2 <3 RBC/hpf   Bacteria, UA FEW (A) RARE   Casts HYALINE CASTS (A) NEGATIVE   Crystals CA OXALATE CRYSTALS (A) NEGATIVE  MRSA PCR Screening     Status: Abnormal   Collection Time: 09/27/14  5:03 AM  Result Value Ref Range   MRSA by PCR POSITIVE (A) NEGATIVE    Comment: CRITICAL RESULT CALLED TO, READ BACK BY AND VERIFIED WITHLarena Glassman AT 1093 09/27/14 Rhea Bleacher        The GeneXpert MRSA Assay (FDA approved for NASAL specimens only), is one component of a comprehensive MRSA colonization surveillance program. It is not intended to diagnose MRSA infection nor to guide or monitor treatment for MRSA infections.   Glucose, capillary     Status: Abnormal   Collection Time: 09/27/14  8:05 AM  Result Value Ref Range   Glucose-Capillary 107 (H) 65 - 99 mg/dL   Comment 1 Notify RN    Ct Abdomen Pelvis W Contrast  09/27/2014   CLINICAL DATA:  Abdominal pain  EXAM: CT ABDOMEN AND PELVIS WITH CONTRAST  TECHNIQUE: Multidetector CT imaging of the abdomen and pelvis was performed using the standard protocol following bolus administration of intravenous contrast.  CONTRAST:  63m OMNIPAQUE IOHEXOL 300 MG/ML  SOLN  COMPARISON:  06/20/2014  FINDINGS: BODY WALL: No  contributory findings.  LOWER CHEST: No contributory findings.  ABDOMEN/PELVIS:  Liver: 14 mm lesion in the medial left liver has mildly indistinct margins but is stable since 2008. A tiny low-density in the posterior right liver on image 18 is also stable and considered incidental. Possible mild hepatic steatosis.  Biliary: Cholelithiasis. There is a newly seen stone in the proximal common bile duct. No CBD dilation. No evidence of cholecystitis.  Pancreas: Peripancreatic edema and diffuse pancreatic expansion. No necrosis or organized collection. No vascular complication.  Spleen: Unremarkable.  Adrenals: Unremarkable.  Kidneys and ureters: No hydronephrosis or stone.  Bladder: Chronic mild bladder wall thickening and indistinctness.  Reproductive: Hysterectomy and oophorectomies. Marked pelvic floor laxity.  Bowel: No obstruction. The appendix is distended with stool, but there is no acute inflammatory change. Extensive distal colonic diverticulosis.  Retroperitoneum: No mass or adenopathy.  Peritoneum: No ascites or pneumoperitoneum.  Vascular: No acute abnormality.  OSSEOUS: No acute abnormalities.  IMPRESSION: 1. Interstitial edematous pancreatitis, likely biliary given cholelithiasis and choledocholithiasis. 2. Chronic findings are noted above.   Electronically Signed   By: JMonte FantasiaM.D.   On: 09/27/2014 03:31       Assessment/Plan 1. Gallstone pancreatitis with choledocholithiasis -recommend GI consult for ERCP for stone removal -cont NPO and allow pancreatitis to resolve -once pancreatitis resolves, then we can plan for lap chole.  This will likely be early next week -patient will need cardiology to see her and clear her prior to surgery as well since she  has not had this completed yet.  She apparently has not seen cardiology since her diagnosis of a fib back in February.  Cont to hold her eliquis.  She is currently in NSR. -D/W primary service regarding GI and cardiology consults. -thank  you for this consultation.  We will follow along. -repeat labs in am Nance Mccombs E 09/27/2014, 9:48 AM Pager: [phone]

## 2014-09-28 ENCOUNTER — Inpatient Hospital Stay (HOSPITAL_COMMUNITY): Payer: Medicare Other

## 2014-09-28 DIAGNOSIS — Z01810 Encounter for preprocedural cardiovascular examination: Secondary | ICD-10-CM

## 2014-09-28 DIAGNOSIS — K859 Acute pancreatitis, unspecified: Secondary | ICD-10-CM

## 2014-09-28 LAB — COMPREHENSIVE METABOLIC PANEL WITH GFR
ALT: 46 U/L (ref 14–54)
AST: 40 U/L (ref 15–41)
Albumin: 2.8 g/dL — ABNORMAL LOW (ref 3.5–5.0)
Alkaline Phosphatase: 79 U/L (ref 38–126)
Anion gap: 8 (ref 5–15)
BUN: 10 mg/dL (ref 6–20)
CO2: 21 mmol/L — ABNORMAL LOW (ref 22–32)
Calcium: 8.1 mg/dL — ABNORMAL LOW (ref 8.9–10.3)
Chloride: 109 mmol/L (ref 101–111)
Creatinine, Ser: 0.96 mg/dL (ref 0.44–1.00)
GFR calc Af Amer: >60 mL/min
GFR calc non Af Amer: 56 mL/min — ABNORMAL LOW
Glucose, Bld: 84 mg/dL (ref 65–99)
Potassium: 4.4 mmol/L (ref 3.5–5.1)
Sodium: 138 mmol/L (ref 135–145)
Total Bilirubin: 0.9 mg/dL (ref 0.3–1.2)
Total Protein: 5.9 g/dL — ABNORMAL LOW (ref 6.5–8.1)

## 2014-09-28 LAB — CBC
HCT: 40.6 % (ref 36.0–46.0)
Hemoglobin: 12.7 g/dL (ref 12.0–15.0)
MCH: 29.5 pg (ref 26.0–34.0)
MCHC: 31.3 g/dL (ref 30.0–36.0)
MCV: 94.4 fL (ref 78.0–100.0)
Platelets: 149 K/uL — ABNORMAL LOW (ref 150–400)
RBC: 4.3 MIL/uL (ref 3.87–5.11)
RDW: 14.8 % (ref 11.5–15.5)
WBC: 17.7 K/uL — ABNORMAL HIGH (ref 4.0–10.5)

## 2014-09-28 LAB — GLUCOSE, CAPILLARY: Glucose-Capillary: 94 mg/dL (ref 65–99)

## 2014-09-28 LAB — LIPASE, BLOOD: Lipase: 149 U/L — ABNORMAL HIGH (ref 22–51)

## 2014-09-28 MED ORDER — REGADENOSON 0.4 MG/5ML IV SOLN
INTRAVENOUS | Status: AC
  Administered 2014-09-28: 0.4 mg via INTRAVENOUS
  Filled 2014-09-28: qty 5

## 2014-09-28 MED ORDER — HEPARIN SODIUM (PORCINE) 5000 UNIT/ML IJ SOLN
5000.0000 [IU] | Freq: Three times a day (TID) | INTRAMUSCULAR | Status: DC
  Administered 2014-09-28 – 2014-09-30 (×6): 5000 [IU] via SUBCUTANEOUS
  Filled 2014-09-28 (×6): qty 1

## 2014-09-28 MED ORDER — TECHNETIUM TC 99M SESTAMIBI GENERIC - CARDIOLITE
10.0000 | Freq: Once | INTRAVENOUS | Status: AC | PRN
  Administered 2014-09-28: 10 via INTRAVENOUS

## 2014-09-28 MED ORDER — REGADENOSON 0.4 MG/5ML IV SOLN
0.4000 mg | Freq: Once | INTRAVENOUS | Status: AC
  Administered 2014-09-28: 0.4 mg via INTRAVENOUS
  Filled 2014-09-28: qty 5

## 2014-09-28 MED ORDER — TECHNETIUM TC 99M SESTAMIBI - CARDIOLITE
30.0000 | Freq: Once | INTRAVENOUS | Status: AC | PRN
  Administered 2014-09-28: 11:00:00 30 via INTRAVENOUS

## 2014-09-28 NOTE — Progress Notes (Signed)
lexiscan myoview completed without complications.  nuc results to follow. 

## 2014-09-28 NOTE — Progress Notes (Signed)
Subjective: Pain improved today, now s/p sphincterotomy and stone removal for choledocholithiasis. No nausea.   Objective: Vital signs in last 24 hours: Filed Vitals:   09/28/14 1046 09/28/14 1047 09/28/14 1049 09/28/14 1225  BP: 129/61 133/62 137/61 140/76  Pulse:  103  101  Temp:    98.2 F (36.8 C)  TempSrc:    Oral  Resp:    19  Height:      Weight:      SpO2:    95%   Weight change: 0 lb (0 kg)  Intake/Output Summary (Last 24 hours) at 09/28/14 1301 Last data filed at 09/28/14 1140  Gross per 24 hour  Intake 5345.83 ml  Output      3 ml  Net 5342.83 ml   Physical Exam: General: Obese elderly white female, alert, cooperative, NAD. HEENT: PERRL, EOMI. Moist mucus membranes Neck: Full range of motion without pain, supple, no lymphadenopathy or carotid bruits Lungs: Clear to ascultation bilaterally, normal work of respiration, no wheezes, rales, rhonchi Heart: RRR, no murmurs, gallops, or rubs Abdomen: Soft, mild tenderness diffusely, non-distended, BS + Extremities: No cyanosis, clubbing, or edema Neurologic: Alert & oriented x3, cranial nerves II-XII intact, strength grossly intact, sensation intact to light touch   Lab Results: Basic Metabolic Panel:  Recent Labs Lab 09/27/14 0147 09/28/14 0348  NA 139 138  K 4.2 4.4  CL 103 109  CO2 24 21*  GLUCOSE 116* 84  BUN 20 10  CREATININE 1.19* 0.96  CALCIUM 9.8 8.1*   Liver Function Tests:  Recent Labs Lab 09/27/14 0147 09/28/14 0348  AST 147* 40  ALT 90* 46  ALKPHOS 118 79  BILITOT 1.1 0.9  PROT 7.7 5.9*  ALBUMIN 3.9 2.8*    Recent Labs Lab 09/27/14 0147 09/28/14 0348  LIPASE >3000* 149*    CBC:  Recent Labs Lab 09/27/14 0147 09/28/14 0348  WBC 15.1* 17.7*  NEUTROABS 11.9*  --   HGB 14.0 12.7  HCT 44.2 40.6  MCV 93.2 94.4  PLT 181 149*   Urinalysis:  Recent Labs Lab 09/27/14 0416  COLORURINE YELLOW  LABSPEC 1.020  PHURINE 5.0  GLUCOSEU NEGATIVE  HGBUR TRACE*    BILIRUBINUR SMALL*  KETONESUR 15*  PROTEINUR NEGATIVE  UROBILINOGEN 0.2  NITRITE NEGATIVE  LEUKOCYTESUR SMALL*   Micro Results: Recent Results (from the past 240 hour(s))  MRSA PCR Screening     Status: Abnormal   Collection Time: 09/27/14  5:03 AM  Result Value Ref Range Status   MRSA by PCR POSITIVE (A) NEGATIVE Final    Comment: CRITICAL RESULT CALLED TO, READ BACK BY AND VERIFIED WITH: Boyd Kerbs AT 1610 09/27/14 Lucienne Capers        The GeneXpert MRSA Assay (FDA approved for NASAL specimens only), is one component of a comprehensive MRSA colonization surveillance program. It is not intended to diagnose MRSA infection nor to guide or monitor treatment for MRSA infections.      Studies/Results: Ct Abdomen Pelvis W Contrast  09/27/2014   CLINICAL DATA:  Abdominal pain  EXAM: CT ABDOMEN AND PELVIS WITH CONTRAST  TECHNIQUE: Multidetector CT imaging of the abdomen and pelvis was performed using the standard protocol following bolus administration of intravenous contrast.  CONTRAST:  80mL OMNIPAQUE IOHEXOL 300 MG/ML  SOLN  COMPARISON:  06/20/2014  FINDINGS: BODY WALL: No contributory findings.  LOWER CHEST: No contributory findings.  ABDOMEN/PELVIS:  Liver: 14 mm lesion in the medial left liver has mildly indistinct margins but is stable since  2008. A tiny low-density in the posterior right liver on image 18 is also stable and considered incidental. Possible mild hepatic steatosis.  Biliary: Cholelithiasis. There is a newly seen stone in the proximal common bile duct. No CBD dilation. No evidence of cholecystitis.  Pancreas: Peripancreatic edema and diffuse pancreatic expansion. No necrosis or organized collection. No vascular complication.  Spleen: Unremarkable.  Adrenals: Unremarkable.  Kidneys and ureters: No hydronephrosis or stone.  Bladder: Chronic mild bladder wall thickening and indistinctness.  Reproductive: Hysterectomy and oophorectomies. Marked pelvic floor laxity.  Bowel: No  obstruction. The appendix is distended with stool, but there is no acute inflammatory change. Extensive distal colonic diverticulosis.  Retroperitoneum: No mass or adenopathy.  Peritoneum: No ascites or pneumoperitoneum.  Vascular: No acute abnormality.  OSSEOUS: No acute abnormalities.  IMPRESSION: 1. Interstitial edematous pancreatitis, likely biliary given cholelithiasis and choledocholithiasis. 2. Chronic findings are noted above.   Electronically Signed   By: Marnee Spring M.D.   On: 09/27/2014 03:31   Dg Ercp Biliary & Pancreatic Ducts  09/27/2014   CLINICAL DATA:  Choledocholithiasis.  EXAM: ERCP  TECHNIQUE: Multiple spot images obtained with the fluoroscopic device and submitted for interpretation post-procedure.  COMPARISON:  CT of the abdomen on 09/27/2014  FINDINGS: Imaging during the procedure obtained with a C-arm demonstrates endoscopic cannulation of the common bile duct with cholangiogram demonstrating potential subtle filling defects in the proximal common bile duct. A balloon sweep maneuver was performed.  IMPRESSION: ERCP demonstrates possible calculus in the proximal common bile duct. A balloon sweep extraction maneuver was performed.  These images were submitted for radiologic interpretation only. Please see the procedural report for the amount of contrast and the fluoroscopy time utilized.   Electronically Signed   By: Irish Lack M.D.   On: 09/27/2014 14:14   Medications: I have reviewed the patient's current medications. Scheduled Meds: . Chlorhexidine Gluconate Cloth  6 each Topical Q0600  . mupirocin ointment  1 application Nasal BID  . sodium chloride  3 mL Intravenous Q12H   Continuous Infusions: . sodium chloride 250 mL/hr at 09/28/14 0607   PRN Meds:.morphine injection, ondansetron **OR** ondansetron (ZOFRAN) IV   Assessment/Plan: 74 y/o F w/ PMHx of HTN, Depression, ?hypothyroidism, and PAF, admitted for pancreatitis.   Pancreatitis w/ Choledocholitiasis: Seen  in CT abdomen/pelvis, lipase initially >3000. Now s/p ERCP w/ sphincterotomy and stone extraction. Patient's labs improved. Still needs cholecystectomy, however, patient needs cardiac clearance prior to surgery. Stress test results pending -Appreciate GI management -Surgery consulted, appreciate recommendations. Plan for cholecystectomy early this week.  -Continue NS; decrease rate to 125 cc/hr -Keep NPO for now; consider starting clears in AM -Morphine 1-2 mg q4h prn -Zofran prn for nausea  PAF: On Eliquis at home, however, she is non-compliant w/ this recently.  -Hold Eliquis for now given acute issues and likely surgery -Continue cardiac monitoring -Cardiac clearance as above  AKI: Resolved.  -IVF -Hold ACEI  HTN: BP only mildly elevated. On Lisinopril 10 mg daily at home.  -Hold ACEI as above  HLD: On simvastatin 10 mg qhs at home. -Hold statin while NPO  VTE PPx: SCD's; restart Heparin [place]  Dispo: Disposition is deferred at this time, awaiting improvement of current medical problems.  Anticipated discharge in approximately 2-3 day(s).   The patient does have a current PCP Baltazar Apo, MD) and does need an [facility] hospital follow-up appointment after discharge.  The patient does not have transportation limitations that hinder transportation to clinic appointments.  Marland Kitchen  Services Needed at time of discharge: Y = Yes, Blank = No PT:   OT:   RN:   Equipment:   Other:     LOS: 1 day   Courtney ParisEden W Jimi Schappert, MD 09/28/2014, 1:01 PM

## 2014-09-28 NOTE — Progress Notes (Addendum)
Gen. Surgery:  Patient is off the floor currently for stress test. Appreciate cardiology involvement for risk assessment.  ERCP and sphincterotomy performed yesterday. A common bile duct stone was extracted. No retained stones were seen on cholangiogram. Labs works reveals the WBC 17,700, hemoglobin 12.7. Total bilirubin 0.9. Lipase 149. Calcium 8.1. Creatinine 0.96.  Continue nothing by mouth and allow pancreatitis to resolve. Hopefully we can proceed with cholecystectomy early next week We will reevaluate tomorrow. Eliquis on hold.   Angelia MouldHaywood M. Derrell LollingIngram, M.D., [facility] SurgeryF[facility] General and Minimally invasive Surgery Breast and Colorectal Surgery Office:   [phone]

## 2014-09-28 NOTE — Progress Notes (Signed)
    Consulting cardiologist: Dr. Arvilla Meresaniel Bensimhon  I reviewed the cardiology consultation from yesterday. Patient off floor at present for testing. [facility] further cardiac risk stratification prior to lap chole. We will followup on results and make further recommendations later.  Jonelle SidleSamuel G. Aaminah Forrester, M.D., F.A.C.C.

## 2014-09-29 ENCOUNTER — Other Ambulatory Visit: Payer: Self-pay

## 2014-09-29 DIAGNOSIS — I10 Essential (primary) hypertension: Secondary | ICD-10-CM

## 2014-09-29 LAB — COMPREHENSIVE METABOLIC PANEL
ALK PHOS: 66 U/L (ref 38–126)
ALT: 31 U/L (ref 14–54)
AST: 23 U/L (ref 15–41)
Albumin: 2.5 g/dL — ABNORMAL LOW (ref 3.5–5.0)
Anion gap: 5 (ref 5–15)
BUN: 8 mg/dL (ref 6–20)
CO2: 22 mmol/L (ref 22–32)
CREATININE: 0.8 mg/dL (ref 0.44–1.00)
Calcium: 8.2 mg/dL — ABNORMAL LOW (ref 8.9–10.3)
Chloride: 109 mmol/L (ref 101–111)
GFR calc Af Amer: >60 mL/min (ref >60)
GFR calc non Af Amer: >60 mL/min (ref >60)
Glucose, Bld: 80 mg/dL (ref 65–99)
POTASSIUM: 3.6 mmol/L (ref 3.5–5.1)
Sodium: 136 mmol/L (ref 135–145)
Total Bilirubin: 1.5 mg/dL — ABNORMAL HIGH (ref 0.3–1.2)
Total Protein: 5.7 g/dL — ABNORMAL LOW (ref 6.5–8.1)

## 2014-09-29 LAB — CBC
HEMATOCRIT: 37.6 % (ref 36.0–46.0)
Hemoglobin: 11.7 g/dL — ABNORMAL LOW (ref 12.0–15.0)
MCH: 29.1 pg (ref 26.0–34.0)
MCHC: 31.1 g/dL (ref 30.0–36.0)
MCV: 93.5 fL (ref 78.0–100.0)
Platelets: 145 10*3/uL — ABNORMAL LOW (ref 150–400)
RBC: 4.02 MIL/uL (ref 3.87–5.11)
RDW: 14.7 % (ref 11.5–15.5)
WBC: 17.7 10*3/uL — AB (ref 4.0–10.5)

## 2014-09-29 LAB — TSH: TSH: 4.855 u[IU]/mL — ABNORMAL HIGH (ref 0.350–4.500)

## 2014-09-29 LAB — GLUCOSE, CAPILLARY: Glucose-Capillary: 74 mg/dL (ref 65–99)

## 2014-09-29 MED ORDER — METOPROLOL TARTRATE 12.5 MG HALF TABLET
12.5000 mg | ORAL_TABLET | Freq: Two times a day (BID) | ORAL | Status: DC
  Administered 2014-09-29 (×2): 12.5 mg via ORAL
  Filled 2014-09-29 (×2): qty 1

## 2014-09-29 NOTE — Progress Notes (Signed)
Subjective: Looks great today compared to yesterday. Pain significantly improved on exam. Tachycardic in the 150's on exam today @ ~1:00 PM, completely asymptomatic.   Objective: Vital signs in last 24 hours: Filed Vitals:   09/28/14 1225 09/28/14 2105 09/29/14 0500 09/29/14 0636  BP: 140/76 107/56  136/59  Pulse: 101 86  87  Temp: 98.2 F (36.8 C) 99.1 F (37.3 C)  98.7 F (37.1 C)  TempSrc: Oral Oral  Oral  Resp: Height:      Weight:   206 lb (93.441 kg)   SpO2: 95% 95%  96%   Weight change: 4 lb (1.814 kg)  Intake/Output Summary (Last 24 hours) at 09/29/14 1301 Last data filed at 09/29/14 1042  Gross per 24 hour  Intake    243 ml  Output      1 ml  Net    242 ml   Physical Exam: General: Obese elderly white female, alert, cooperative, NAD. HEENT: PERRL, EOMI. Moist mucus membranes Neck: Full range of motion without pain, supple, no lymphadenopathy or carotid bruits Lungs: Clear to ascultation bilaterally, normal work of respiration, no wheezes, rales, rhonchi Heart: Tachycardic, no murmurs, gallops, or rubs Abdomen: Soft, non-tender, non-distended, BS + Extremities: No cyanosis, clubbing, or edema Neurologic: Alert & oriented x3, cranial nerves II-XII intact, strength grossly intact, sensation intact to light touch   Lab Results: Basic Metabolic Panel:  Recent Labs Lab 09/28/14 0348 09/29/14 0430  NA 138 136  K 4.4 3.6  CL 109 109  CO2 21* 22  GLUCOSE 84 80  BUN 10 8  CREATININE 0.96 0.80  CALCIUM 8.1* 8.2*   Liver Function Tests:  Recent Labs Lab 09/28/14 0348 09/29/14 0430  AST 40 23  ALT 46 31  ALKPHOS 79 66  BILITOT 0.9 1.5*  PROT 5.9* 5.7*  ALBUMIN 2.8* 2.5*    Recent Labs Lab 09/27/14 0147 09/28/14 0348  LIPASE >3000* 149*    CBC:  Recent Labs Lab 09/27/14 0147 09/28/14 0348 09/29/14 0430  WBC 15.1* 17.7* 17.7*  NEUTROABS 11.9*  --   --   HGB 14.0 12.7 11.7*  HCT 44.2 40.6 37.6  MCV 93.2 94.4 93.5  PLT 181  149* 145*   Urinalysis:  Recent Labs Lab 09/27/14 0416  COLORURINE YELLOW  LABSPEC 1.020  PHURINE 5.0  GLUCOSEU NEGATIVE  HGBUR TRACE*  BILIRUBINUR SMALL*  KETONESUR 15*  PROTEINUR NEGATIVE  UROBILINOGEN 0.2  NITRITE NEGATIVE  LEUKOCYTESUR SMALL*   Micro Results: Recent Results (from the past 240 hour(s))  MRSA PCR Screening     Status: Abnormal   Collection Time: 09/27/14  5:03 AM  Result Value Ref Range Status   MRSA by PCR POSITIVE (A) NEGATIVE Final    Comment: CRITICAL RESULT CALLED TO, READ BACK BY AND VERIFIED WITH: Boyd Kerbs AT [id number] 09/27/14 Lucienne Capers        The GeneXpert MRSA Assay (FDA approved for NASAL specimens only), is one component of a comprehensive MRSA colonization surveillance program. It is not intended to diagnose MRSA infection nor to guide or monitor treatment for MRSA infections.      Studies/Results: Nm Myocar Multi W/spect W/wall Motion / Ef  09/28/2014    This is a low risk study.  The study is normal.  The left ventricular ejection fraction is normal (55-65%).    Dg Ercp Biliary & Pancreatic Ducts  09/27/2014   CLINICAL DATA:  Choledocholithiasis.  EXAM: ERCP  TECHNIQUE: Multiple spot images obtained  with the fluoroscopic device and submitted for interpretation post-procedure.  COMPARISON:  CT of the abdomen on 09/27/2014  FINDINGS: Imaging during the procedure obtained with a C-arm demonstrates endoscopic cannulation of the common bile duct with cholangiogram demonstrating potential subtle filling defects in the proximal common bile duct. A balloon sweep maneuver was performed.  IMPRESSION: ERCP demonstrates possible calculus in the proximal common bile duct. A balloon sweep extraction maneuver was performed.  These images were submitted for radiologic interpretation only. Please see the procedural report for the amount of contrast and the fluoroscopy time utilized.   Electronically Signed   By: Irish LackGlenn  Yamagata M.D.   On: 09/27/2014 14:14    Medications: I have reviewed the patient's current medications. Scheduled Meds: . Chlorhexidine Gluconate Cloth  6 each Topical Q0600  . heparin  5,000 Units Subcutaneous 3 times per day  . mupirocin ointment  1 application Nasal BID  . sodium chloride  3 mL Intravenous Q12H   Continuous Infusions: . sodium chloride 125 mL/hr at 09/28/14 1315   PRN Meds:.morphine injection, ondansetron **OR** ondansetron (ZOFRAN) IV   Assessment/Plan: 74 y/o F w/ PMHx of HTN, Depression, ?hypothyroidism, and PAF, admitted for pancreatitis.   Pancreatitis w/ Choledocholitiasis: Now s/p ERCP w/ sphincterotomy and stone extraction. Patient's labs improved, total bilirubin increased to 1.5 today. Myoview yesterday low risk study. -Surgery consulted, appreciate recommendations. Plan for cholecystectomy early this week.  -Continue NS @ 125 cc/hr -Start clear liquid diet -Morphine 1-2 mg q4h prn -Zofran prn for nausea  Tachycardia w/ PAF: On Eliquis at home, however, she is non-compliant w/ this recently. In what appeared to be ?sinus tachycardia on exam today, rate in the 150's, asymptomatic. Reviewed telemetry, short RR intervals appear to march out, does not appear to be irregular. Question of atrial flutter, however, no obvious flutter waves or different p-wave morphologies to suggest MAT. This patient's CHA2DS2-VASc Score and unadjusted Ischemic Stroke Rate (% per year) is equal to 4.8 % stroke rate/year from a score of 4.  -Cardiology following, appreciate recommendations -No intervention at this time, patient no longer tachycardic, appears to be somewhat positional. Will discuss further with cardiology.  -Continue to hold Eliquis for now given acute issues and likely surgery -Continue cardiac monitoring  HTN: Stable. On Lisinopril 10 mg daily at home.  -Hold ACEI as above  HLD: On Zocor 10 mg qhs at home. -Restart statin once tolerating po  VTE PPx: SCD's; restart Heparin [place]  Dispo:  Disposition is deferred at this time, awaiting improvement of current medical problems.  Anticipated discharge in approximately 2-3 day(s).   The patient does have a current PCP Baltazar Apo(Samaya J Qureshi, MD) and [facility] after [facility].  The patient does not have transportation limitations that hinder transportation to clinic appointments.  .Services Needed at time of discharge: Y = Yes, Blank = No PT:   OT:   RN:   Equipment:   Other:     LOS: 2 days   Courtney ParisEden W Aleiah Mohammed, MD 09/29/2014, 1:01 PM

## 2014-09-29 NOTE — Progress Notes (Signed)
2 Days Post-Op  Subjective: Awake and alert. Elderly. Sitting in chair. Since she had some epigastric pain earlier but feels much better after pain medicine.   no nausea or vomiting. Appreciate cardiology evaluation. Lexiscan Myoview was felt to be low risk. There may be a tiny amount of ischemia but LVEF is normal. She is asymptomatic. No further cardiac workup necessary. Felt to be acceptable cardiac risk for cholecystectomy. Cardiology advises against starting beta blockers now.  WBC remains 17,700. Hemoglobin 11.7. T bilirubin slightly elevated 1.5. AST and ALT normal. Lipase not performed. Calcium 8.2. Creatinine 0.8. Glucose 80.  Objective is delivered is fine: Vital signs in last 24 hours: Temp:  [98.2 F (36.8 C)-99.1 F (37.3 C)] 98.7 F (37.1 C) (05/29 0636) Pulse Rate:  [86-103] 87 (05/29 0636) Resp:  [19] 19 (05/29 0636) BP: (107-145)/(51-76) 136/59 mmHg (05/29 0636) SpO2:  [95 %-96 %] 96 % (05/29 0636) Weight:  [93.441 kg (206 lb)] 93.441 kg (206 lb) (05/29 0500) Last BM Date: 09/26/14  Intake/Output from previous day: 05/28 0701 - 05/29 0700 In: 3 [I.V.:3] Out: 4 [Urine:4] Intake/Output this shift: Total I/O In: -  Out: 1 [Urine:1]  General appearance: Alert. Minimal distress. Cooperative. Elderly and a little bit feeble. GI: Soft. Tender in epigastrium with minimal guarding. Doesn't seem distended.  Lab Results:   Recent Labs  09/28/14 0348 09/29/14 0430  WBC 17.7* 17.7*  HGB 12.7 11.7*  HCT 40.6 37.6  PLT 149* 145*   BMET  Recent Labs  09/28/14 0348 09/29/14 0430  NA 138 136  K 4.4 3.6  CL 109 109  CO2 21* 22  GLUCOSE 84 80  BUN 10 8  CREATININE 0.96 0.80  CALCIUM 8.1* 8.2*   PT/INR No results for input(s): LABPROT, INR in the last 72 hours. ABG No results for input(s): PHART, HCO3 in the last 72 hours.  Invalid input(s): PCO2, PO2  Studies/Results: Nm Myocar Multi W/spect W/wall Motion / Ef  09/28/2014    This is a low risk study.   The study is normal.  The left ventricular ejection fraction is normal (55-65%).    Dg Ercp Biliary & Pancreatic Ducts  09/27/2014   CLINICAL DATA:  Choledocholithiasis.  EXAM: ERCP  TECHNIQUE: Multiple spot images obtained with the fluoroscopic device and submitted for interpretation post-procedure.  COMPARISON:  CT of the abdomen on 09/27/2014  FINDINGS: Imaging during the procedure obtained with a C-arm demonstrates endoscopic cannulation of the common bile duct with cholangiogram demonstrating potential subtle filling defects in the proximal common bile duct. A balloon sweep maneuver was performed.  IMPRESSION: ERCP demonstrates possible calculus in the proximal common bile duct. A balloon sweep extraction maneuver was performed.  These images were submitted for radiologic interpretation only. Please see the procedural report for the amount of contrast and the fluoroscopy time utilized.   Electronically Signed   By: Irish LackGlenn  Yamagata M.D.   On: 09/27/2014 14:14    Anti-infectives: Anti-infectives    None      Assessment/Plan: s/p Procedure(s): ENDOSCOPIC RETROGRADE CHOLANGIOPANCREATOGRAPHY (ERCP)  Gallstone pancreatitis. Significant pancreatic edema noted on CT but no complication. Hopefully pancreatitis will subside in a couple of days allowing for cholecystectomy during this admission. Repeat labs and bedside assessment tomorrow Clear liquid diet.  POD #2. ERCP sphincterotomy and stone extraction by GI (5/27)  PAF. Eloquence on hold Hypertension. Normotensive at present. Morbid obesity GERD      LOS: 2 days    Fabio Wah M 09/29/2014

## 2014-09-29 NOTE — Progress Notes (Signed)
    Patient noted to have episodes of intermittent tachycardia this PM, reviewed ECGs with primary team - sinus with frequent PACs most recently. She has history of PAF per chart. I had mentioned not starting beta blocker this AM for perioperative risk reduction since she had not been on one recently, however since she is having episodes of atrial arrhythmia would go ahead and start low dose Lopressor. Can titrate as needed.   Jonelle SidleSamuel G. McDowell, M.D., F.A.C.C.

## 2014-09-29 NOTE — Progress Notes (Signed)
Consulting cardiologist: Dr. Arvilla Meresaniel Bensimhon  Seen for followup: Preoperative cardiac evaluation  Subjective:    No chest pain or dyspnea at rest. Still NPO.  Objective:   Temp:  [98.2 F (36.8 C)-99.1 F (37.3 C)] 98.7 F (37.1 C) (05/29 0636) Pulse Rate:  [86-103] 87 (05/29 0636) Resp:  [19] 19 (05/29 0636) BP: (107-151)/(51-76) 136/59 mmHg (05/29 0636) SpO2:  [95 %-96 %] 96 % (05/29 0636) Weight:  [206 lb (93.441 kg)] 206 lb (93.441 kg) (05/29 0500) Last BM Date: 09/26/14  Filed Weights   09/27/14 0447 09/27/14 1239 09/29/14 0500  Weight: 212 lb (96.163 kg) 202 lb (91.627 kg) 206 lb (93.441 kg)    Intake/Output Summary (Last 24 hours) at 09/29/14 0820 Last data filed at 09/28/14 2151  Gross per 24 hour  Intake      3 ml  Output      3 ml  Net      0 ml    Telemetry: Sinus rhythm.  Exam:  General: Obese woman in no distress.  Lungs: Clear, nonlabored.  Cardiac: RRR without gallop.  Extremities: No pitting edema.   Lab Results:  Basic Metabolic Panel:  Recent Labs Lab 09/27/14 0147 09/28/14 0348 09/29/14 0430  NA 139 138 136  K 4.2 4.4 3.6  CL 103 109 109  CO2 24 21* 22  GLUCOSE 116* 84 80  BUN 20 10 8   CREATININE 1.19* 0.96 0.80  CALCIUM 9.8 8.1* 8.2*    Liver Function Tests:  Recent Labs Lab 09/27/14 0147 09/28/14 0348 09/29/14 0430  AST 147* 40 23  ALT 90* 46 31  ALKPHOS 118 79 66  BILITOT 1.1 0.9 1.5*  PROT 7.7 5.9* 5.7*  ALBUMIN 3.9 2.8* 2.5*    CBC:  Recent Labs Lab 09/27/14 0147 09/28/14 0348 09/29/14 0430  WBC 15.1* 17.7* 17.7*  HGB 14.0 12.7 11.7*  HCT 44.2 40.6 37.6  MCV 93.2 94.4 93.5  PLT 181 149* 145*    Lexiscan Myoview 09/28/2014:  Isotope administration The isotope used for nuclear imaging was [id number] Sestamibi. IV Site: left antecubital fossa.Rest isotope was administered on 09/28/2014 at 09:00 with an IV injection of 10 mCi. Rest SPECT images were obtained approximately 45 minutes post tracer  injection. Stress isotope was administered on 09/28/2014 at 10:50 with an IV injection of 30 mCi Stress SPECT images were obtained approximately 30 minutes post tracer injection.    Nuclear Study Quality There is no nuclear artifact present. Overall image quality is good.    Nuclear Measurements Study was gated.    Study Impression Myocardial perfusion is normal. The study is normal. This is a low risk study. Overall left ventricular systolic function was normal. LV cavity size is normal. The left ventricular ejection fraction is normal (55-65%). There is no prior study for comparison.       Medications:   Scheduled Medications: . Chlorhexidine Gluconate Cloth  6 each Topical Q0600  . heparin  5,000 Units Subcutaneous 3 times per day  . mupirocin ointment  1 application Nasal BID  . sodium chloride  3 mL Intravenous Q12H     Infusions: . sodium chloride 125 mL/hr at 09/28/14 1315     PRN Medications:  morphine injection, ondansetron **OR** ondansetron (ZOFRAN) IV   Assessment:   1. Preoperative cardiac evaluation prior to anticipated cholecystectomy. Lexiscan Myoview (reported low risk) noted above - I reviewed the images, and there does look to be a mild degree of ischemia in the mid to apical  anterior wall, LVEF normal. In the absence of angina or CHF symptoms however, would not pursue cardiac catheterization at this point. She should be able to proceed with surgery at an acceptable perioperative cardiac risk. Not on beta blocker at home, so would not start now just before surgery.  2. PAF, Eliquis is on hold pending surgery.  3. Hypertension, on Lisinopril at home, recent systolics 100-130.  4. Gallstone pancreatitis.   Plan/Discussion:    As noted above, no further cardiac testing planned at this point. Continue telemetry in perioperative setting and check postoperative ECG. We will follow.   Jonelle Sidle, M.D., F.A.C.C.

## 2014-09-29 NOTE — Progress Notes (Signed)
Cardiologist on call notified of pt's hr ranging from the 140-160s. Pt asymptomatic & laying down in bed. VS stable & charted. Pt recently seen by cardiology twice today for similar issues & started on metoprolol 12.5 mg BID. Awaiting new orders. Will continue to monitor the pt. Sanda LingerMilam, Verlin Duke R, RN

## 2014-09-30 DIAGNOSIS — Z9889 Other specified postprocedural states: Secondary | ICD-10-CM

## 2014-09-30 DIAGNOSIS — K8050 Calculus of bile duct without cholangitis or cholecystitis without obstruction: Secondary | ICD-10-CM

## 2014-09-30 DIAGNOSIS — K851 Biliary acute pancreatitis without necrosis or infection: Secondary | ICD-10-CM | POA: Insufficient documentation

## 2014-09-30 DIAGNOSIS — Z6841 Body Mass Index (BMI) 40.0 and over, adult: Secondary | ICD-10-CM

## 2014-09-30 LAB — CBC
HEMATOCRIT: 37.3 % (ref 36.0–46.0)
Hemoglobin: 11.9 g/dL — ABNORMAL LOW (ref 12.0–15.0)
MCH: 29.5 pg (ref 26.0–34.0)
MCHC: 31.9 g/dL (ref 30.0–36.0)
MCV: 92.3 fL (ref 78.0–100.0)
PLATELETS: 155 10*3/uL (ref 150–400)
RBC: 4.04 MIL/uL (ref 3.87–5.11)
RDW: 14.4 % (ref 11.5–15.5)
WBC: 13.6 10*3/uL — ABNORMAL HIGH (ref 4.0–10.5)

## 2014-09-30 LAB — COMPREHENSIVE METABOLIC PANEL
ALT: 26 U/L (ref 14–54)
ANION GAP: 8 (ref 5–15)
AST: 27 U/L (ref 15–41)
Albumin: 2.6 g/dL — ABNORMAL LOW (ref 3.5–5.0)
Alkaline Phosphatase: 74 U/L (ref 38–126)
BILIRUBIN TOTAL: 2 mg/dL — AB (ref 0.3–1.2)
BUN: 11 mg/dL (ref 6–20)
CO2: 21 mmol/L — ABNORMAL LOW (ref 22–32)
CREATININE: 0.75 mg/dL (ref 0.44–1.00)
Calcium: 8.3 mg/dL — ABNORMAL LOW (ref 8.9–10.3)
Chloride: 107 mmol/L (ref 101–111)
GFR calc Af Amer: >60 mL/min (ref >60)
Glucose, Bld: 89 mg/dL (ref 65–99)
POTASSIUM: 3.7 mmol/L (ref 3.5–5.1)
Sodium: 136 mmol/L (ref 135–145)
Total Protein: 6 g/dL — ABNORMAL LOW (ref 6.5–8.1)

## 2014-09-30 LAB — BILIRUBIN, FRACTIONATED(TOT/DIR/INDIR)
BILIRUBIN DIRECT: 0.4 mg/dL (ref 0.1–0.5)
BILIRUBIN INDIRECT: 2 mg/dL — AB (ref 0.3–0.9)
Total Bilirubin: 2.4 mg/dL — ABNORMAL HIGH (ref 0.3–1.2)

## 2014-09-30 LAB — T4, FREE: FREE T4: 1.09 ng/dL (ref 0.61–1.12)

## 2014-09-30 LAB — GLUCOSE, CAPILLARY: Glucose-Capillary: 94 mg/dL (ref 65–99)

## 2014-09-30 LAB — LIPASE, BLOOD: Lipase: 19 U/L — ABNORMAL LOW (ref 22–51)

## 2014-09-30 MED ORDER — CHLORHEXIDINE GLUCONATE 4 % EX LIQD
1.0000 "application " | Freq: Once | CUTANEOUS | Status: AC
  Administered 2014-10-01: 1 via TOPICAL
  Filled 2014-09-30: qty 15

## 2014-09-30 MED ORDER — CEFAZOLIN SODIUM-DEXTROSE 2-3 GM-% IV SOLR
2.0000 g | INTRAVENOUS | Status: AC
  Administered 2014-10-01: 2 g via INTRAVENOUS
  Filled 2014-09-30: qty 50

## 2014-09-30 MED ORDER — METOPROLOL TARTRATE 25 MG PO TABS
25.0000 mg | ORAL_TABLET | Freq: Two times a day (BID) | ORAL | Status: DC
  Administered 2014-09-30 – 2014-10-02 (×5): 25 mg via ORAL
  Filled 2014-09-30 (×5): qty 1

## 2014-09-30 NOTE — Consult Note (Signed)
WOC wound consult note Reason for Consult: Nonblanchable redness to sacrum Wound type:Stage I pressure ulcer Pressure Ulcer POA: No Measurement:3 cm x 3 cm erythema. Wound ZOX:WRUEAVbed:intact Drainage (amount, consistency, odor) none Periwound:intact Dressing procedure/placement/frequency:Cleanse sacrum with soap and water.  Apply Allevyn silicone border foam dressing. Change every 3 days and PRN soilage.  Will not follow at this time.  Please re-consult if needed.  Maple HudsonKaren Adrick Kestler RN BSN CWON Pager [phone]

## 2014-09-30 NOTE — Progress Notes (Signed)
    Subjective:  Sitting in chair, no complaints  Objective:  Vital Signs in the last 24 hours: Temp:  [98.2 F (36.8 C)-98.3 F (36.8 C)] 98.2 F (36.8 C) (05/30 [id number]) Pulse Rate:  [83-158] 83 (05/30 0613) Resp:  [19-20] 20 (05/30 0613) BP: (140-161)/(59-90) 153/69 mmHg (05/30 0613) SpO2:  [94 %-96 %] 94 % (05/30 0613)  Intake/Output from previous day: 05/29 0701 - 05/30 0700 In: 720 [P.O.:720] Out: -    Physical Exam: General: Well developed, well nourished, in no acute distress. Head:  Normocephalic and atraumatic. Lungs: Clear to auscultation and percussion. Heart: Normal S1 and S2.  No murmur, rubs or gallops.  Abdomen: soft, non-tender, positive bowel sounds. Obese Extremities: No clubbing or cyanosis. No edema. Neurologic: Alert and oriented x 3.    Lab Results:  Recent Labs  09/29/14 0430 09/30/14 0450  WBC 17.7* 13.6*  HGB 11.7* 11.9*  PLT 145* 155    Recent Labs  09/29/14 0430 09/30/14 0450  NA 136 136  K 3.6 3.7  CL 109 107  CO2 22 21*  GLUCOSE 80 89  BUN 8 11  CREATININE 0.80 0.75   No results for input(s): TROPONINI in the last 72 hours.  Invalid input(s): CK, MB Hepatic Function Panel  Recent Labs  09/30/14 0450  PROT 6.0*  ALBUMIN 2.6*  AST 27  ALT 26  ALKPHOS 74  BILITOT 2.0*  Nm Myocar Multi W/spect W/wall Motion / Ef  09/28/2014    This is a low risk study.  The study is normal.  The left ventricular ejection fraction is normal (55-65%).    Personally viewed.   Telemetry: SVT noted, occasional PAT, may be slow atrial flutter in some strips.  Personally viewed.    Cardiac Studies:  ECHO normal EF. NUC low risk  Scheduled Meds: . Chlorhexidine Gluconate Cloth  6 each Topical Q0600  . heparin  5,000 Units Subcutaneous 3 times per day  . metoprolol tartrate  12.5 mg Oral BID  . mupirocin ointment  1 application Nasal BID  . sodium chloride  3 mL Intravenous Q12H   Continuous Infusions: . sodium chloride 75 mL/hr  at 09/29/14 1505   PRN Meds:.morphine injection, ondansetron **OR** ondansetron (ZOFRAN) IV  Assessment/Plan:  Principal Problem:   Acute recurrent pancreatitis Active Problems:   Hyperlipidemia   Morbid obesity with BMI of 45.0-49.9, adult   Depression with anxiety   Essential hypertension   GERD   Edema of both legs   Anemia   Mixed incontinence urge and stress   Cholelithiasis   Paroxysmal atrial fibrillation  PSVT - agree with metoprolol, I will increase to 25 BID.  - PAT on monitor (Parox atrial tachy)  - At times, looks like atrial flutter pattern. Has history of AFIB   PAF - CHA2DS2-Vasc score 4-5 (age, sex, HTN +/- h/o HF) - will need to resume eliquis when cleared by surgery after lap chole  Morbid obesity  - weight loss  - risk factor for cardiac morbidity  - increases risk for PAF  Gallstone pancreatitis  - s/p ERCP, sphincterotomy and stone extraction by GI 5/27  SKAINS, MARK 09/30/2014, 9:19 AM

## 2014-09-30 NOTE — Progress Notes (Addendum)
3 Days Post-Op  Subjective: Patient states she feels much better and her pain has resolved. Tolerating full liquid diet. She had a couple of bowel movements.  Appreciate cardiology involvement. Felt to be checked acceptable risk for cholecystectomy. No further cardiology workup necessary, apparently. Cardiology advises against starting beta blockers at this point.  Total bilirubin 2.4. Transaminases normal. Alkaline phosphatase normal. Creatinine 0.75. Glucose 89. WBC down to 13,600. Hemoglobin 11.9, stable.   Objective: Vital signs in last 24 hours: Temp:  [98.2 F (36.8 C)-98.3 F (36.8 C)] 98.2 F (36.8 C) (05/30 16100613) Pulse Rate:  [83-158] 83 (05/30 0613) Resp:  [19-20] 20 (05/30 0613) BP: (140-161)/(59-90) 153/69 mmHg (05/30 0613) SpO2:  [94 %-96 %] 94 % (05/30 0613) Last BM Date: 09/29/14  Intake/Output from previous day: 05/29 0701 - 05/30 0700 In: 720 [P.O.:720] Out: -  Intake/Output this shift:    General appearance: Alert and stable. Elderly and a little bit feeble but no distress. Mental status normal. Friendly and cooperative. Resp: clear to auscultation bilaterally GI: Abdomen now soft and nontender. Benign exam.  Lab Results:   Recent Labs  09/29/14 0430 09/30/14 0450  WBC 17.7* 13.6*  HGB 11.7* 11.9*  HCT 37.6 37.3  PLT 145* 155   BMET  Recent Labs  09/29/14 0430 09/30/14 0450  NA 136 136  K 3.6 3.7  CL 109 107  CO2 22 21*  GLUCOSE 80 89  BUN 8 11  CREATININE 0.80 0.75  CALCIUM 8.2* 8.3*   PT/INR No results for input(s): LABPROT, INR in the last 72 hours. ABG No results for input(s): PHART, HCO3 in the last 72 hours.  Invalid input(s): PCO2, PO2  Studies/Results: Nm Myocar Multi W/spect W/wall Motion / Ef  09/28/2014    This is a low risk study.  The study is normal.  The left ventricular ejection fraction is normal (55-65%).     Anti-infectives: Anti-infectives    None      Assessment/Plan: s/p Procedure(s): ENDOSCOPIC  RETROGRADE CHOLANGIOPANCREATOGRAPHY (ERCP)  Gallstone pancreatitis. Significant pancreatic edema noted on CT but no complication. Pancreatitis is now resolving and I think it is safe to go ahead and planned laparoscopic cholecystectomy for tomorrow I have offered to schedule the surgery for Dr. Marca Anconaom Cornett to be her surgeon and she is in full agreement.  I discussed the indications, details, techniques, and numerous risks of laparoscopic cholecystectomy with cholangiogram, possible open cholecystectomy with her. She's aware the risk of bleeding, infection, conversion to open laparotomy, injury to adjacent organs, bile leak, wound hernia, cardiac pulmonary and thromboembolic palms. She understands these issues well. At this time all of her questions are answered. She agrees with this plan. OR notified and case posted with Jill AlexandersJustin.  POD #3. ERCP sphincterotomy and stone extraction by GI (5/27)  PAF. Eliquis on hold Hypertension. Normotensive at present. Morbid obesity GERD   LOS: 3 days    Daneen Volcy M 09/30/2014

## 2014-09-30 NOTE — Progress Notes (Signed)
  Date: 09/30/2014  Patient name: Lisa Haynes  Medical record number: [id number]  Date of birth: Jul 27, 1940   This patient's plan of care was discussed with the house staff. Please see Dr. Joaquin MusicKrall's note for complete details. I concur with her findings.  Planned cholecystectomy tomorrow, NPO p MN.    Inez CatalinaEmily B Dempsey Ahonen, MD 09/30/2014, 2:05 PM

## 2014-09-30 NOTE — Progress Notes (Signed)
Subjective: No acute events overnight. Reports feeling better overall. Abdominal pain improved. Tolerating clear liquids without emesis.   Objective: Vital signs in last 24 hours: Filed Vitals:   09/29/14 0636 09/29/14 1951 09/29/14 2250 09/30/14 0613  BP: 136/59 140/90 161/59 153/69  Pulse: 87 158 91 83  Temp: 98.7 F (37.1 C)  98.3 F (36.8 C) 98.2 F (36.8 C)  TempSrc: Oral  Oral   Resp: Height:      Weight:      SpO2: 96% 94% 96% 94%   Weight change:   Intake/Output Summary (Last 24 hours) at 09/30/14 0929 Last data filed at 09/29/14 1752  Gross per 24 hour  Intake    720 ml  Output      0 ml  Net    720 ml   Physical Exam: General: Obese elderly white female, alert, cooperative, NAD. HEENT: PERRL, EOMI. Moist mucus membranes Neck: Full range of motion without pain, supple, no lymphadenopathy or carotid bruits Lungs: Clear to ascultation bilaterally, normal work of respiration, no wheezes, rales, rhonchi Heart: RRR, no murmurs, gallops, or rubs Abdomen: Soft, non-tender, non-distended, BS + Extremities: No cyanosis, clubbing, or edema Neurologic: Alert & oriented x3, cranial nerves II-XII intact, strength grossly intact, sensation intact to light touch   Lab Results: Basic Metabolic Panel:  Recent Labs Lab 09/29/14 0430 09/30/14 0450  NA 136 136  K 3.6 3.7  CL 109 107  CO2 22 21*  GLUCOSE 80 89  BUN 8 11  CREATININE 0.80 0.75  CALCIUM 8.2* 8.3*   Liver Function Tests:  Recent Labs Lab 09/29/14 0430 09/30/14 0450  AST 23 27  ALT 31 26  ALKPHOS 66 74  BILITOT 1.5* 2.0*  PROT 5.7* 6.0*  ALBUMIN 2.5* 2.6*    Recent Labs Lab 09/28/14 0348 09/30/14 0450  LIPASE 149* 19*    CBC:  Recent Labs Lab 09/27/14 0147  09/29/14 0430 09/30/14 0450  WBC 15.1*  < > 17.7* 13.6*  NEUTROABS 11.9*  --   --   --   HGB 14.0  < > 11.7* 11.9*  HCT 44.2  < > 37.6 37.3  MCV 93.2  < > 93.5 92.3  PLT 181  < > 145* 155  < > = values in  this interval not displayed. Urinalysis:  Recent Labs Lab 09/27/14 0416  COLORURINE YELLOW  LABSPEC 1.020  PHURINE 5.0  GLUCOSEU NEGATIVE  HGBUR TRACE*  BILIRUBINUR SMALL*  KETONESUR 15*  PROTEINUR NEGATIVE  UROBILINOGEN 0.2  NITRITE NEGATIVE  LEUKOCYTESUR SMALL*   Micro Results: Recent Results (from the past 240 hour(s))  MRSA PCR Screening     Status: Abnormal   Collection Time: 09/27/14  5:03 AM  Result Value Ref Range Status   MRSA by PCR POSITIVE (A) NEGATIVE Final    Comment: CRITICAL RESULT CALLED TO, READ BACK BY AND VERIFIED WITH: Boyd Kerbs AT 1610 09/27/14 Lucienne Capers        The GeneXpert MRSA Assay (FDA approved for NASAL specimens only), is one component of a comprehensive MRSA colonization surveillance program. It is not intended to diagnose MRSA infection nor to guide or monitor treatment for MRSA infections.      Studies/Results: Nm Myocar Multi W/spect W/wall Motion / Ef  09/28/2014    This is a low risk study.  The study is normal.  The left ventricular ejection fraction is normal (55-65%).    Medications: I have reviewed the patient's current medications. Scheduled  Meds: . Chlorhexidine Gluconate Cloth  6 each Topical Q0600  . heparin  5,000 Units Subcutaneous 3 times per day  . metoprolol tartrate  25 mg Oral BID  . mupirocin ointment  1 application Nasal BID  . sodium chloride  3 mL Intravenous Q12H   Continuous Infusions: . sodium chloride 75 mL/hr at 09/29/14 1505   PRN Meds:.morphine injection, ondansetron **OR** ondansetron (ZOFRAN) IV   Assessment/Plan: 74 y/o F w/ PMHx of HTN, Depression, ?hypothyroidism, and PAF, admitted for pancreatitis.   Pancreatitis w/ Choledocholitiasis: s/p ERCP w/ sphincterotomy and stone extraction 09/27/2014. Tolerating clear liquid diet. -Surgery consulted, appreciate recommendations. Plan for cholecystectomy tomorrow  -Continue NS @ 75 cc/hr -Start full liquid diet -Morphine 1-2 mg q4h prn -Zofran  prn for nausea  Tachycardia w/ PAF: Improved -Cardiology following, appreciate recommendations -metoprolol 25mg  BID -Continue to hold Eliquis until cleared by surgery -Continue cardiac monitoring  HTN: Stable. On Lisinopril 10 mg daily at home.  -Hold ACEI as above  HLD: On Zocor 10 mg qhs at home. -Restart statin once tolerating po  VTE PPx: SCD's  Dispo: Disposition is deferred at this time, awaiting improvement of current medical problems.  Anticipated discharge in approximately 2-3 day(s).   The patient does have a current PCP [facility] Apo(Samaya J Qureshi, MD) and [facility].  The patient does not have transportation limitations that hinder transportation to clinic appointments.  .Services Needed at time of discharge: Y = Yes, Blank = No PT:   OT:   RN:   Equipment:   Other:     LOS: 3 days   Lora PaulaJennifer T Percy Winterrowd, MD 09/30/2014, 9:29 AM

## 2014-10-01 ENCOUNTER — Inpatient Hospital Stay (HOSPITAL_COMMUNITY): Payer: Medicare Other | Admitting: Anesthesiology

## 2014-10-01 ENCOUNTER — Other Ambulatory Visit: Payer: Self-pay

## 2014-10-01 ENCOUNTER — Encounter (HOSPITAL_COMMUNITY): Payer: Self-pay | Admitting: Gastroenterology

## 2014-10-01 ENCOUNTER — Encounter (HOSPITAL_COMMUNITY)
Admission: EM | Disposition: A | Discharge status: Home / Self Care | Payer: Self-pay | Source: Home / Self Care | Attending: Internal Medicine

## 2014-10-01 DIAGNOSIS — Z9049 Acquired absence of other specified parts of digestive tract: Secondary | ICD-10-CM

## 2014-10-01 HISTORY — PX: CHOLECYSTECTOMY: SHX55

## 2014-10-01 LAB — CBC
HEMATOCRIT: 36.1 % (ref 36.0–46.0)
Hemoglobin: 11.5 g/dL — ABNORMAL LOW (ref 12.0–15.0)
MCH: 29.2 pg (ref 26.0–34.0)
MCHC: 31.9 g/dL (ref 30.0–36.0)
MCV: 91.6 fL (ref 78.0–100.0)
Platelets: 202 10*3/uL (ref 150–400)
RBC: 3.94 MIL/uL (ref 3.87–5.11)
RDW: 14.2 % (ref 11.5–15.5)
WBC: 11.3 10*3/uL — AB (ref 4.0–10.5)

## 2014-10-01 LAB — COMPREHENSIVE METABOLIC PANEL
ALT: 20 U/L (ref 14–54)
ANION GAP: 6 (ref 5–15)
AST: 17 U/L (ref 15–41)
Albumin: 2.6 g/dL — ABNORMAL LOW (ref 3.5–5.0)
Alkaline Phosphatase: 64 U/L (ref 38–126)
BUN: 10 mg/dL (ref 6–20)
CALCIUM: 8 mg/dL — AB (ref 8.9–10.3)
CO2: 22 mmol/L (ref 22–32)
CREATININE: 0.59 mg/dL (ref 0.44–1.00)
Chloride: 108 mmol/L (ref 101–111)
GFR calc non Af Amer: >60 mL/min (ref >60)
Glucose, Bld: 89 mg/dL (ref 65–99)
Potassium: 3.3 mmol/L — ABNORMAL LOW (ref 3.5–5.1)
SODIUM: 136 mmol/L (ref 135–145)
TOTAL PROTEIN: 5.7 g/dL — AB (ref 6.5–8.1)
Total Bilirubin: 1.2 mg/dL (ref 0.3–1.2)

## 2014-10-01 LAB — LIPASE, BLOOD: Lipase: 23 U/L (ref 22–51)

## 2014-10-01 LAB — GLUCOSE, CAPILLARY: GLUCOSE-CAPILLARY: 87 mg/dL (ref 65–99)

## 2014-10-01 LAB — PHOSPHORUS: Phosphorus: 2.4 mg/dL — ABNORMAL LOW (ref 2.5–4.6)

## 2014-10-01 LAB — MAGNESIUM: MAGNESIUM: 1.8 mg/dL (ref 1.7–2.4)

## 2014-10-01 SURGERY — LAPAROSCOPIC CHOLECYSTECTOMY WITH INTRAOPERATIVE CHOLANGIOGRAM
Anesthesia: General

## 2014-10-01 MED ORDER — EPHEDRINE SULFATE 50 MG/ML IJ SOLN
INTRAMUSCULAR | Status: AC
  Filled 2014-10-01: qty 1

## 2014-10-01 MED ORDER — ONDANSETRON HCL 4 MG/2ML IJ SOLN
INTRAMUSCULAR | Status: AC
  Filled 2014-10-01: qty 2

## 2014-10-01 MED ORDER — GLYCOPYRROLATE 0.2 MG/ML IJ SOLN
INTRAMUSCULAR | Status: DC | PRN
  Administered 2014-10-01: .8 mg via INTRAVENOUS

## 2014-10-01 MED ORDER — STERILE WATER FOR INJECTION IJ SOLN
INTRAMUSCULAR | Status: AC
  Filled 2014-10-01: qty 10

## 2014-10-01 MED ORDER — POTASSIUM PHOSPHATES 15 MMOLE/5ML IV SOLN
20.0000 mmol | Freq: Once | INTRAVENOUS | Status: AC
  Administered 2014-10-01: 20 mmol via INTRAVENOUS
  Filled 2014-10-01 (×2): qty 6.67

## 2014-10-01 MED ORDER — ROCURONIUM BROMIDE 50 MG/5ML IV SOLN
INTRAVENOUS | Status: AC
  Filled 2014-10-01: qty 1

## 2014-10-01 MED ORDER — BUPIVACAINE-EPINEPHRINE (PF) 0.25% -1:200000 IJ SOLN
INTRAMUSCULAR | Status: AC
  Filled 2014-10-01: qty 30

## 2014-10-01 MED ORDER — PHENYLEPHRINE HCL 10 MG/ML IJ SOLN
INTRAMUSCULAR | Status: DC | PRN
  Administered 2014-10-01: 80 ug via INTRAVENOUS

## 2014-10-01 MED ORDER — SODIUM CHLORIDE 0.9 % IJ SOLN
INTRAMUSCULAR | Status: AC
  Filled 2014-10-01: qty 10

## 2014-10-01 MED ORDER — BUPIVACAINE-EPINEPHRINE 0.25% -1:200000 IJ SOLN
INTRAMUSCULAR | Status: DC | PRN
  Administered 2014-10-01: 5 mL

## 2014-10-01 MED ORDER — FENTANYL CITRATE (PF) 250 MCG/5ML IJ SOLN
INTRAMUSCULAR | Status: AC
  Filled 2014-10-01: qty 5

## 2014-10-01 MED ORDER — 0.9 % SODIUM CHLORIDE (POUR BTL) OPTIME
TOPICAL | Status: DC | PRN
  Administered 2014-10-01: 1000 mL

## 2014-10-01 MED ORDER — PHENYLEPHRINE 40 MCG/ML (10ML) SYRINGE FOR IV PUSH (FOR BLOOD PRESSURE SUPPORT)
PREFILLED_SYRINGE | INTRAVENOUS | Status: AC
  Filled 2014-10-01: qty 20

## 2014-10-01 MED ORDER — ONDANSETRON HCL 4 MG/2ML IJ SOLN
INTRAMUSCULAR | Status: DC | PRN
  Administered 2014-10-01: 4 mg via INTRAVENOUS

## 2014-10-01 MED ORDER — PROPOFOL 10 MG/ML IV BOLUS
INTRAVENOUS | Status: AC
  Filled 2014-10-01: qty 20

## 2014-10-01 MED ORDER — NEOSTIGMINE METHYLSULFATE 10 MG/10ML IV SOLN
INTRAVENOUS | Status: DC | PRN
  Administered 2014-10-01: 5 mg via INTRAVENOUS

## 2014-10-01 MED ORDER — GLYCOPYRROLATE 0.2 MG/ML IJ SOLN
INTRAMUSCULAR | Status: AC
  Filled 2014-10-01: qty 7

## 2014-10-01 MED ORDER — EPHEDRINE SULFATE 50 MG/ML IJ SOLN
INTRAMUSCULAR | Status: DC | PRN
  Administered 2014-10-01: 10 mg via INTRAVENOUS

## 2014-10-01 MED ORDER — SODIUM CHLORIDE 0.9 % IR SOLN
Status: DC | PRN
  Administered 2014-10-01 (×2): 1000 mL

## 2014-10-01 MED ORDER — LACTATED RINGERS IV SOLN
INTRAVENOUS | Status: DC | PRN
  Administered 2014-10-01: 14:00:00 via INTRAVENOUS

## 2014-10-01 MED ORDER — LIDOCAINE HCL (CARDIAC) 20 MG/ML IV SOLN
INTRAVENOUS | Status: AC
  Filled 2014-10-01: qty 5

## 2014-10-01 MED ORDER — ROCURONIUM BROMIDE 100 MG/10ML IV SOLN
INTRAVENOUS | Status: DC | PRN
  Administered 2014-10-01: 50 mg via INTRAVENOUS

## 2014-10-01 MED ORDER — OXYCODONE-ACETAMINOPHEN 5-325 MG PO TABS
1.0000 | ORAL_TABLET | ORAL | Status: DC | PRN
  Administered 2014-10-02: 1 via ORAL
  Filled 2014-10-01: qty 1

## 2014-10-01 MED ORDER — OXYCODONE HCL 5 MG/5ML PO SOLN
5.0000 mg | Freq: Once | ORAL | Status: DC | PRN
Start: 2014-10-01 — End: 2014-10-01

## 2014-10-01 MED ORDER — VECURONIUM BROMIDE 10 MG IV SOLR
INTRAVENOUS | Status: AC
  Filled 2014-10-01: qty 10

## 2014-10-01 MED ORDER — OXYCODONE HCL 5 MG PO TABS: 5.0000 mg | ORAL_TABLET | Freq: Once | ORAL | Status: DC | PRN

## 2014-10-01 MED ORDER — MEPERIDINE HCL 25 MG/ML IJ SOLN: 6.2500 mg | INTRAMUSCULAR | Status: DC | PRN

## 2014-10-01 MED ORDER — LACTATED RINGERS IV SOLN
INTRAVENOUS | Status: DC
  Administered 2014-10-01: 13:00:00 via INTRAVENOUS

## 2014-10-01 MED ORDER — SUCCINYLCHOLINE CHLORIDE 20 MG/ML IJ SOLN
INTRAMUSCULAR | Status: AC
  Filled 2014-10-01: qty 1

## 2014-10-01 MED ORDER — PROPOFOL 10 MG/ML IV BOLUS
INTRAVENOUS | Status: DC | PRN
  Administered 2014-10-01: 100 mg via INTRAVENOUS

## 2014-10-01 MED ORDER — LIDOCAINE HCL (CARDIAC) 20 MG/ML IV SOLN
INTRAVENOUS | Status: DC | PRN
  Administered 2014-10-01: 50 mg via INTRAVENOUS

## 2014-10-01 MED ORDER — HYDROMORPHONE HCL 1 MG/ML IJ SOLN
INTRAMUSCULAR | Status: AC
  Filled 2014-10-01: qty 1

## 2014-10-01 MED ORDER — HYDROMORPHONE HCL 1 MG/ML IJ SOLN
0.2500 mg | INTRAMUSCULAR | Status: DC | PRN
  Administered 2014-10-01 (×2): 0.5 mg via INTRAVENOUS

## 2014-10-01 MED ORDER — FENTANYL CITRATE (PF) 100 MCG/2ML IJ SOLN
INTRAMUSCULAR | Status: DC | PRN
  Administered 2014-10-01: 100 ug via INTRAVENOUS
  Administered 2014-10-01 (×3): 50 ug via INTRAVENOUS

## 2014-10-01 SURGICAL SUPPLY — 44 items
ADH SKN CLS APL DERMABOND .7 (GAUZE/BANDAGES/DRESSINGS) ×1
APPLIER CLIP ROT 10 11.4 M/L (STAPLE) ×3
APR CLP MED LRG 11.4X10 (STAPLE) ×1
BAG SPEC RTRVL LRG 6X4 10 (ENDOMECHANICALS) ×1
CANISTER SUCTION 2500CC (MISCELLANEOUS) ×3 IMPLANT
CHLORAPREP W/TINT 26ML (MISCELLANEOUS) ×3 IMPLANT
CLIP APPLIE ROT 10 11.4 M/L (STAPLE) ×1 IMPLANT
COVER MAYO STAND STRL (DRAPES) ×3 IMPLANT
COVER SURGICAL LIGHT HANDLE (MISCELLANEOUS) ×3 IMPLANT
DERMABOND ADVANCED (GAUZE/BANDAGES/DRESSINGS) ×2
DERMABOND ADVANCED .7 DNX12 (GAUZE/BANDAGES/DRESSINGS) IMPLANT
DRAPE LAPAROSCOPIC ABDOMINAL (DRAPES) ×3 IMPLANT
ELECT REM PT RETURN 9FT ADLT (ELECTROSURGICAL) ×3
ELECTRODE REM PT RTRN 9FT ADLT (ELECTROSURGICAL) ×1 IMPLANT
GLOVE BIO SURGEON STRL SZ 6.5 (GLOVE) ×1 IMPLANT
GLOVE BIO SURGEON STRL SZ7 (GLOVE) ×2 IMPLANT
GLOVE BIO SURGEON STRL SZ8 (GLOVE) ×3 IMPLANT
GLOVE BIO SURGEONS STRL SZ 6.5 (GLOVE) ×1
GLOVE BIOGEL PI IND STRL 7.5 (GLOVE) IMPLANT
GLOVE BIOGEL PI IND STRL 8 (GLOVE) ×1 IMPLANT
GLOVE BIOGEL PI INDICATOR 7.5 (GLOVE) ×2
GLOVE BIOGEL PI INDICATOR 8 (GLOVE) ×2
GOWN STRL REUS W/ TWL LRG LVL3 (GOWN DISPOSABLE) ×2 IMPLANT
GOWN STRL REUS W/ TWL XL LVL3 (GOWN DISPOSABLE) ×1 IMPLANT
GOWN STRL REUS W/TWL LRG LVL3 (GOWN DISPOSABLE) ×6
GOWN STRL REUS W/TWL XL LVL3 (GOWN DISPOSABLE) ×3
KIT BASIN OR (CUSTOM PROCEDURE TRAY) ×3 IMPLANT
KIT ROOM TURNOVER OR (KITS) ×3 IMPLANT
LIQUID BAND (GAUZE/BANDAGES/DRESSINGS) ×3 IMPLANT
NS IRRIG 1000ML POUR BTL (IV SOLUTION) ×3 IMPLANT
PAD ARMBOARD 7.5X6 YLW CONV (MISCELLANEOUS) ×3 IMPLANT
POUCH SPECIMEN RETRIEVAL 10MM (ENDOMECHANICALS) ×3 IMPLANT
SCISSORS LAP 5X35 DISP (ENDOMECHANICALS) ×3 IMPLANT
SET IRRIG TUBING LAPAROSCOPIC (IRRIGATION / IRRIGATOR) ×3 IMPLANT
SLEEVE ENDOPATH XCEL 5M (ENDOMECHANICALS) ×3 IMPLANT
SPECIMEN JAR SMALL (MISCELLANEOUS) ×3 IMPLANT
SUT MNCRL AB 4-0 PS2 18 (SUTURE) ×3 IMPLANT
TOWEL OR 17X24 6PK STRL BLUE (TOWEL DISPOSABLE) ×3 IMPLANT
TOWEL OR 17X26 10 PK STRL BLUE (TOWEL DISPOSABLE) ×3 IMPLANT
TRAY LAPAROSCOPIC (CUSTOM PROCEDURE TRAY) ×3 IMPLANT
TROCAR XCEL BLUNT TIP 100MML (ENDOMECHANICALS) ×3 IMPLANT
TROCAR XCEL NON-BLD 11X100MML (ENDOMECHANICALS) ×3 IMPLANT
TROCAR XCEL NON-BLD 5MMX100MML (ENDOMECHANICALS) ×3 IMPLANT
TUBING INSUFFLATION (TUBING) ×3 IMPLANT

## 2014-10-01 NOTE — Anesthesia Procedure Notes (Signed)
Procedure Name: Intubation Date/Time: 10/01/2014 2:08 PM Performed by: Carmela RimaMARTINELLI, Ting Cage F Pre-anesthesia Checklist: Suction available, Emergency Drugs available, Patient identified, Timeout performed and Patient being monitored Patient Re-evaluated:Patient Re-evaluated prior to inductionOxygen Delivery Method: Circle system utilized Preoxygenation: Pre-oxygenation with 100% oxygen Intubation Type: IV induction Ventilation: Mask ventilation without difficulty Laryngoscope Size: Mac and 3 Grade View: Grade I Tube type: Oral Tube size: 7.0 mm Number of attempts: 1 Placement Confirmation: positive ETCO2,  ETT inserted through vocal cords under direct vision and breath sounds checked- equal and bilateral Secured at: 21 cm Tube secured with: Tape Dental Injury: Teeth and Oropharynx as per pre-operative assessment

## 2014-10-01 NOTE — H&P (View-Only) (Signed)
Patient ID: Lisa Haynes, female   DOB: February 26, 1941, 74 y.o.   MRN: [id number] Patient ready for surgery today.  No further questions this morning.  She denies pain.  Consent has been signed and patient is NPO.  Stefon Ramthun E 9:01 AM 10/01/2014

## 2014-10-01 NOTE — Progress Notes (Signed)
Patient ID: Lisa Haynes, female   DOB: 07/11/1940, 74 y.o.   MRN: [id number] Patient ready for surgery today.  No further questions this morning.  She denies pain.  Consent has been signed and patient is NPO.  Cal Gindlesperger E 9:01 AM 10/01/2014  

## 2014-10-01 NOTE — Transfer of Care (Signed)
Immediate Anesthesia Transfer of Care Note  Patient: Lisa Haynes  Procedure(s) Performed: Procedure(s): LAPAROSCOPIC CHOLECYSTECTOMY  (N/A)  Patient Location: PACU  Anesthesia Type:General  Level of Consciousness: awake  Airway & Oxygen Therapy: Patient Spontanous Breathing and Patient connected to nasal cannula oxygen  Post-op Assessment: Report given to RN  Post vital signs: Reviewed and stable  Last Vitals:  Filed Vitals:   10/01/14 0627  BP: 156/72  Pulse: 97  Temp: 36.9 C  Resp: 18    Complications: No apparent anesthesia complications

## 2014-10-01 NOTE — Anesthesia Preprocedure Evaluation (Addendum)
Anesthesia Evaluation  Patient identified by MRN, date of birth, ID band Patient awake    Reviewed: Allergy & Precautions, NPO status , Patient's Chart, lab work & pertinent test results  Airway Mallampati: II  TM Distance: >3 FB Neck ROM: Full    Dental  (+) Edentulous Upper, Edentulous Lower, Dental Advisory Given   Pulmonary  breath sounds clear to auscultation        Cardiovascular hypertension, Pt. on medications Rhythm:Regular Rate:Normal     Neuro/Psych    GI/Hepatic GERD-  Medicated and Controlled,  Endo/Other  Morbid obesity  Renal/GU      Musculoskeletal   Abdominal   Peds  Hematology   Anesthesia Other Findings Hx of A Fib but none recently  Reproductive/Obstetrics                          Anesthesia Physical Anesthesia Plan  ASA: III  Anesthesia Plan: General   Post-op Pain Management:    Induction: Intravenous  Airway Management Planned: Oral ETT  Additional Equipment:   Intra-op Plan:   Post-operative Plan: Extubation in OR  Informed Consent: I have reviewed the patients History and Physical, chart, labs and discussed the procedure including the risks, benefits and alternatives for the proposed anesthesia with the patient or authorized representative who has indicated his/her understanding and acceptance.   Dental advisory given and Dental Advisory Given  Plan Discussed with: CRNA, Anesthesiologist and Surgeon  Anesthesia Plan Comments:        Anesthesia Quick Evaluation

## 2014-10-01 NOTE — Care Management Note (Signed)
Case Management Note  Patient Details  Name: Lisa Haynes MRN: [id number] Date of Birth: [birth year]  Subjective/Objective:                    Action/Plan: UR updated  Expected Discharge Date:      10-02-14            Expected Discharge Plan:  Home/Self Care  In-House Referral:     Discharge planning Services     Post Acute Care Choice:    Choice offered to:     DME Arranged:    DME Agency:     HH Arranged:    HH Agency:     Status of Service:     Medicare Important Message Given:  Yes Date Medicare IM Given:  10/01/14 Medicare IM give by:  Ronny Flurryheather Dariyon Urquilla RN BSN Date Additional Medicare IM Given:    Additional Medicare Important Message give by:     If discussed at Long Length of Stay Meetings, dates discussed:    Additional Comments:  Lisa Haynes, Lisa Gieselman Marie, RN 10/01/2014, 11:19 AM

## 2014-10-01 NOTE — Interval H&P Note (Signed)
History and Physical Interval Note:  10/01/2014 1:20 PM  Lisa Haynes  has presented today for surgery, with the diagnosis of cholecystectomy  The various methods of treatment have been discussed with the patient and family. After consideration of risks, benefits and other options for treatment, the patient has consented to  Procedure(s): LAPAROSCOPIC CHOLECYSTECTOMY WITH INTRAOPERATIVE CHOLANGIOGRAM (N/A) as a surgical intervention .  The patient's history has been reviewed, patient examined, no change in status, stable for surgery.  I have reviewed the patient's chart and labs.  Questions were answered to the patient's satisfaction.     [name] A.

## 2014-10-01 NOTE — Progress Notes (Signed)
  Date: 10/01/2014  Patient name: Lisa Haynes J Lutz  Medical record number: [id number]  Date of birth: 17-Dec-1940   This patient's plan of care was discussed with the house staff. Please see Dr. Joaquin MusicKrall's note for complete details. I concur with her findings.  Patient underwent laparoscopic cholecystectomy today.    Inez CatalinaEmily B Elle Vezina, MD 10/01/2014, 4:04 PM

## 2014-10-01 NOTE — Progress Notes (Signed)
Subjective: No acute events overnight. She reports she is doing well overall. Tolerated full liquid diet yesterday. Denies N/V, abdominal pain.  Objective: Vital signs in last 24 hours: Filed Vitals:   09/30/14 0613 09/30/14 1317 09/30/14 2046 10/01/14 0627  BP: 153/69 156/81 148/77 156/72  Pulse: 83 74 84 97  Temp: 98.2 F (36.8 C) 97.9 F (36.6 C) 97.7 F (36.5 C) 98.4 F (36.9 C)  TempSrc:  Oral Oral Oral  Resp: Height:      Weight:    223 lb 6.4 oz (101.334 kg)  SpO2: 94% 97% 96% 96%   Weight change:   Intake/Output Summary (Last 24 hours) at 10/01/14 0724 Last data filed at 09/30/14 2210  Gross per 24 hour  Intake   1215 ml  Output      0 ml  Net   1215 ml   Physical Exam: General: Obese elderly white female, alert, cooperative, NAD. HEENT: PERRL, EOMI. Moist mucus membranes Neck: Full range of motion without pain, supple, no lymphadenopathy or carotid bruits Lungs: Clear to ascultation bilaterally, normal work of respiration, no wheezes, rales, rhonchi Heart: RRR, no murmurs, gallops, or rubs Abdomen: Soft, non-tender, non-distended, BS + Extremities: No cyanosis, clubbing, or edema Neurologic: Alert & oriented x3, cranial nerves II-XII intact, strength grossly intact, sensation intact to light touch   Lab Results: Basic Metabolic Panel:  Recent Labs Lab 09/30/14 0450 10/01/14 0539  NA 136 136  K 3.7 3.3*  CL 107 108  CO2 21* 22  GLUCOSE 89 89  BUN 11 10  CREATININE 0.75 0.59  CALCIUM 8.3* 8.0*  MG  --  1.8  PHOS  --  2.4*   Liver Function Tests:  Recent Labs Lab 09/30/14 0450 10/01/14 0539  AST 27 17  ALT 26 20  ALKPHOS 74 64  BILITOT 2.4*  2.0* 1.2  PROT 6.0* 5.7*  ALBUMIN 2.6* 2.6*    Recent Labs Lab 09/30/14 0450 10/01/14 0539  LIPASE 19* 23    CBC:  Recent Labs Lab 09/27/14 0147  09/30/14 0450 10/01/14 0539  WBC 15.1*  < > 13.6* 11.3*  NEUTROABS 11.9*  --   --   --   HGB 14.0  < > 11.9* 11.5*  HCT  44.2  < > 37.3 36.1  MCV 93.2  < > 92.3 91.6  PLT 181  < > 155 202  < > = values in this interval not displayed. Urinalysis:  Recent Labs Lab 09/27/14 0416  COLORURINE YELLOW  LABSPEC 1.020  PHURINE 5.0  GLUCOSEU NEGATIVE  HGBUR TRACE*  BILIRUBINUR SMALL*  KETONESUR 15*  PROTEINUR NEGATIVE  UROBILINOGEN 0.2  NITRITE NEGATIVE  LEUKOCYTESUR SMALL*   Micro Results: Recent Results (from the past 240 hour(s))  MRSA PCR Screening     Status: Abnormal   Collection Time: 09/27/14  5:03 AM  Result Value Ref Range Status   MRSA by PCR POSITIVE (A) NEGATIVE Final    Comment: CRITICAL RESULT CALLED TO, READ BACK BY AND VERIFIED WITH: Boyd Kerbs AT 1610 09/27/14 Lucienne Capers        The GeneXpert MRSA Assay (FDA approved for NASAL specimens only), is one component of a comprehensive MRSA colonization surveillance program. It is not intended to diagnose MRSA infection nor to guide or monitor treatment for MRSA infections.      Studies/Results: No results found. Medications: I have reviewed the patient's current medications. Scheduled Meds: .  ceFAZolin (ANCEF) IV  2 g Intravenous  To OR  . Chlorhexidine Gluconate Cloth  6 each Topical Q0600  . metoprolol tartrate  25 mg Oral BID  . mupirocin ointment  1 application Nasal BID  . sodium chloride  3 mL Intravenous Q12H   Continuous Infusions: . sodium chloride 75 mL/hr at 09/30/14 1900   PRN Meds:.morphine injection, ondansetron **OR** ondansetron (ZOFRAN) IV   Assessment/Plan: 74 y/o F w/ PMHx of HTN, Depression, ?hypothyroidism, and PAF, admitted for pancreatitis.   Pancreatitis w/ Choledocholitiasis: s/p ERCP w/ sphincterotomy and stone extraction 09/27/2014. -Surgery consulted, appreciate recommendations.  -Plan for cholecystectomy today -Continue NS @ 75 cc/hr -Morphine 1-2 mg q4h prn -Zofran prn for nausea  Tachycardia w/ PAF: Improved -Cardiology following, appreciate recommendations -metoprolol 25mg   BID -Continue to hold Eliquis until cleared by surgery -Continue cardiac monitoring  HTN: Stable. On Lisinopril 10 mg daily at home.  -Hold ACEI as above  HLD: On Zocor 10 mg qhs at home. -Restart statin once tolerating po   VTE PPx: SCD's  Dispo: Disposition is deferred at this time, awaiting improvement of current medical problems.  Anticipated discharge in approximately 2-3 day(s).   The patient does have a current PCP [facility](Samaya J Qureshi, MD) and [facility].  The patient does not have transportation limitations that hinder transportation to clinic appointments.  .Services Needed at time of discharge: Y = Yes, Blank = No PT:   OT:   RN:   Equipment:   Other:     LOS: 4 days   Lora PaulaJennifer T Carel Schnee, MD 10/01/2014, 7:24 AM

## 2014-10-01 NOTE — Op Note (Signed)
Laparoscopic Cholecystectomy Procedure Note  Indications: This patient presents with symptomatic gallbladder disease and will undergo laparoscopic cholecystectomy. The procedure has been discussed with the patient. Operative and non operative treatments have been discussed. Risks of surgery include bleeding, infection,  Common bile duct injury,  Injury to the stomach,liver, colon,small intestine, abdominal wall,  Diaphragm,  Major blood vessels,  And the need for an open procedure.  Other risks include worsening of medical problems, death,  DVT and pulmonary embolism, and cardiovascular events.   Medical options have also been discussed. The patient has been informed of long term expectations of surgery and non surgical options,  The patient agrees to proceed.    Pre-operative Diagnosis: Calculus of gallbladder with acute cholecystitis, without mention of obstruction gallstone pancreatitis  And hx CBD  Stone   Post-operative Diagnosis: Same  Surgeon: Harriette BouillonORNETT,Dalena Plantz A.   Assistants: none  Anesthesia: General endotracheal anesthesia and Local anesthesia 0.25.% bupivacaine, with epinephrine  ASA Class: 3  Procedure Details  The patient was seen again in the Holding Room. The risks, benefits, complications, treatment options, and expected outcomes were discussed with the patient. The possibilities of reaction to medication, pulmonary aspiration, perforation of viscus, bleeding, recurrent infection, finding a normal gallbladder, the need for additional procedures, failure to diagnose a condition, the possible need to convert to an open procedure, and creating a complication requiring transfusion or operation were discussed with the patient. The patient and/or family concurred with the proposed plan, giving informed consent. The site of surgery properly noted/marked. The patient was taken to Operating Room, identified as Lisa Haynes and the procedure verified as Laparoscopic Cholecystectomy with  Intraoperative Cholangiograms. A Time Out was held and the above information confirmed.  Prior to the induction of general anesthesia, antibiotic prophylaxis was administered. General endotracheal anesthesia was then administered and tolerated well. After the induction, the abdomen was prepped in the usual sterile fashion. The patient was positioned in the supine position with the left arm comfortably tucked, along with some reverse Trendelenburg.  Local anesthetic agent was injected into the skin near the umbilicus and an incision made. The midline fascia was incised and the Hasson technique was used to introduce a 12 mm port under direct vision. It was secured with a figure of eight Vicryl suture placed in the usual fashion. Pneumoperitoneum was then created with CO2 and tolerated well without any adverse changes in the patient's vital signs. Additional trocars were introduced under direct vision with an 11 mm trocar in the epigastrium and two  5 mm trocars in the right upper quadrant. All skin incisions were infiltrated with a local anesthetic agent before making the incision and placing the trocars.   The gallbladder was identified, the fundus grasped and retracted cephalad. Adhesions were lysed bluntly and with the electrocautery where indicated, taking care not to injure any adjacent organs or viscus. The infundibulum was grasped and retracted laterally, exposing the peritoneum overlying the triangle of Calot. This was then divided and exposed in a blunt fashion. The cystic duct was clearly identified and bluntly dissected circumferentially. The junctions of the gallbladder, cystic duct and common bile duct were clearly identified prior to the division of any linear structure.   The anatomy was clear and given recent ERCP  Sphincterotomy  And pancreatitis I elected to forgo cholangiogram.   The cystic duct was then  ligated with surgical clips  on the patient side and  clipped on the gallbladder side  and divided. The cystic artery was identified,  dissected free, ligated with clips and divided as well. Posterior cystic artery clipped and divided.  The gallbladder was dissected from the liver bed in retrograde fashion with the electrocautery. The gallbladder was removed. The liver bed was irrigated and inspected. Hemostasis was achieved with the electrocautery. Copious irrigation was utilized and was repeatedly aspirated until clear all particulate matter. Hemostasis was achieved with no signs  Of bleeding or bile leakage.  Pneumoperitoneum was completely reduced after viewing removal of the trocars under direct vision. The wound was thoroughly irrigated and the fascia was then closed with a figure of eight suture; the skin was then closed with 4 O monocryl  and a sterile dressing was applied.  Instrument, sponge, and needle counts were correct at closure and at the conclusion of the case.   Findings: Cholecystitis with Cholelithiasis  Estimated Blood Loss: less than 50 mL         Drains: none         Total IV Fluids: 800 mL         Specimens: Gallbladder           Complications: None; patient tolerated the procedure well.         Disposition: PACU - hemodynamically stable.         Condition: stable

## 2014-10-02 ENCOUNTER — Other Ambulatory Visit: Payer: Self-pay | Admitting: Internal Medicine

## 2014-10-02 ENCOUNTER — Encounter (HOSPITAL_COMMUNITY): Payer: Self-pay | Admitting: Surgery

## 2014-10-02 LAB — COMPREHENSIVE METABOLIC PANEL
ALK PHOS: 62 U/L (ref 38–126)
ALT: 21 U/L (ref 14–54)
AST: 21 U/L (ref 15–41)
Albumin: 2.4 g/dL — ABNORMAL LOW (ref 3.5–5.0)
Anion gap: 8 (ref 5–15)
BUN: 6 mg/dL (ref 6–20)
CALCIUM: 8.1 mg/dL — AB (ref 8.9–10.3)
CO2: 24 mmol/L (ref 22–32)
Chloride: 105 mmol/L (ref 101–111)
Creatinine, Ser: 0.68 mg/dL (ref 0.44–1.00)
GFR calc non Af Amer: >60 mL/min (ref >60)
GLUCOSE: 103 mg/dL — AB (ref 65–99)
Potassium: 4 mmol/L (ref 3.5–5.1)
Sodium: 137 mmol/L (ref 135–145)
TOTAL PROTEIN: 5.2 g/dL — AB (ref 6.5–8.1)
Total Bilirubin: 0.7 mg/dL (ref 0.3–1.2)

## 2014-10-02 LAB — CBC
HEMATOCRIT: 37.2 % (ref 36.0–46.0)
HEMOGLOBIN: 11.7 g/dL — AB (ref 12.0–15.0)
MCH: 29.1 pg (ref 26.0–34.0)
MCHC: 31.5 g/dL (ref 30.0–36.0)
MCV: 92.5 fL (ref 78.0–100.0)
Platelets: 235 10*3/uL (ref 150–400)
RBC: 4.02 MIL/uL (ref 3.87–5.11)
RDW: 14.5 % (ref 11.5–15.5)
WBC: 11.5 10*3/uL — ABNORMAL HIGH (ref 4.0–10.5)

## 2014-10-02 LAB — PHOSPHORUS: Phosphorus: 3.5 mg/dL (ref 2.5–4.6)

## 2014-10-02 LAB — MAGNESIUM: Magnesium: 1.9 mg/dL (ref 1.7–2.4)

## 2014-10-02 LAB — GLUCOSE, CAPILLARY: GLUCOSE-CAPILLARY: 84 mg/dL (ref 65–99)

## 2014-10-02 MED ORDER — METOPROLOL TARTRATE 25 MG PO TABS: 25.0000 mg | ORAL_TABLET | Freq: Two times a day (BID) | ORAL | Status: DC

## 2014-10-02 MED ORDER — OXYCODONE-ACETAMINOPHEN 5-325 MG PO TABS: 1.0000 | ORAL_TABLET | ORAL | Status: DC | PRN

## 2014-10-02 NOTE — Evaluation (Signed)
Physical Therapy Evaluation Patient Details Name: Lisa Haynes MRN: [id number] DOB: 10-27-1940 Today's Date: 10/02/2014   History of Present Illness  Ms Tawney is a 74 year old woman with history of pancreatitis, paroxysmal atrial fibrillation, HTN, HL, GERD who presents with abdominal pain. Pt is now s/p LAPAROSCOPIC CHOLECYSTECTOMY (10/01/14)  Clinical Impression  Pt admitted with above diagnosis. Pt currently with functional limitations due to the deficits listed below (see PT Problem List).  Pt will benefit from skilled PT to increase their independence and safety with mobility to allow discharge to the venue listed below. Pt moving at min/guard level with RW. Recommend 24 hour S initially and pt states she thinks that other family members will be able to come while her son is at work. Pt's HR elevated with gait and SOB noted with o2 sats in 90s.     Follow Up Recommendations Home health PT;Supervision/Assistance - 24 hour    Equipment Recommendations  None recommended by PT    Recommendations for Other Services       Precautions / Restrictions Precautions Precaution Comments: Monitor HR      Mobility  Bed Mobility               General bed mobility comments: Pt up in recliner upon arrival.  Transfers Overall transfer level: Needs assistance Equipment used: Rolling walker (2 wheeled) Transfers: Sit to/from Stand Sit to Stand: Min guard         General transfer comment: Stood from recliner and low toilet (x 2) with min/guard with some difficulty, but able to do without any physical assistance to push up.  Ambulation/Gait Ambulation/Gait assistance: Min guard Ambulation Distance (Feet): 90 Feet Assistive device: Rolling walker (2 wheeled) Gait Pattern/deviations: Step-through pattern     General Gait Details: Pt with decreased safety at times with gait.  Occasionally taking hands off of RW.  Pt very SOB during gait and o2 95%. HR increased into 150s and  bouncing around from low 100s into 140s.  Nursing aware.  Stairs            Wheelchair Mobility    Modified Rankin (Stroke Patients Only)       Balance Overall balance assessment: Needs assistance   Sitting balance-Leahy Scale: Good       Standing balance-Leahy Scale: Fair Standing balance comment: Requires RW for safety                             Pertinent Vitals/Pain Pain Assessment: No/denies pain    Home Living Family/patient expects to be discharged to:: Private residence Living Arrangements: Children Available Help at Discharge: Family;Available PRN/intermittently Type of Home: Apartment Home Access: Stairs to enter (can go to the back with 1 step and says it is easier)   Entrance Stairs-Number of Steps: 3 Home Layout: One level Home Equipment: Walker - 4 wheels;Bedside commode;Shower seat;Cane - quad      Prior Function Level of Independence: Independent with assistive device(s)               Hand Dominance   Dominant Hand: Right    Extremity/Trunk Assessment   Upper Extremity Assessment: Defer to OT evaluation           Lower Extremity Assessment: Generalized weakness;Overall WFL for tasks assessed         Communication   Communication: No difficulties  Cognition Arousal/Alertness: Awake/alert Behavior During Therapy: WFL for tasks assessed/performed Overall Cognitive Status:  Within Functional Limits for tasks assessed                      General Comments      Exercises        Assessment/Plan    PT Assessment Patient needs continued PT services  PT Diagnosis Difficulty walking   PT Problem List Decreased strength;Decreased activity tolerance;Decreased balance;Decreased mobility;Decreased knowledge of use of DME  PT Treatment Interventions DME instruction;Gait training;Stair training;Functional mobility training;Therapeutic activities;Therapeutic exercise;Patient/family education   PT Goals  (Current goals can be found in the Care Plan section) Acute Rehab PT Goals Patient Stated Goal: go home PT Goal Formulation: With patient Time For Goal Achievement: 10/16/14 Potential to Achieve Goals: Good    Frequency Min 3X/week   Barriers to discharge        Co-evaluation               End of Session Equipment Utilized During Treatment: Gait belt Activity Tolerance: Treatment limited secondary to medical complications (Comment) (HR elevated.  ) Patient left: in chair;with call bell/phone within reach Nurse Communication: Mobility status         Time: [id number] PT Time Calculation (min) (ACUTE ONLY): 34 min   Charges:   PT Evaluation $Initial PT Evaluation Tier I: 1 Procedure PT Treatments $Gait Training: 8-22 mins   PT G Codes:        Salinda Snedeker LUBECK 10/02/2014, 12:16 PM

## 2014-10-02 NOTE — Progress Notes (Signed)
Subjective:  Doing very well after surgery. Tolerated very large meal this morning. No recurrent abdominal pain. Feels that she is ready to go home.  Objective: Vital signs in last 24 hours: Filed Vitals:   10/01/14 2150 10/02/14 0200 10/02/14 0552 10/02/14 1355  BP: 151/48 152/57 113/68 123/75  Pulse: 77 72 71 85  Temp: 97.4 F (36.3 C) 97.3 F (36.3 C) 98.3 F (36.8 C) 98.1 F (36.7 C)  TempSrc: Oral Oral Oral Oral  Resp: 17 16 16 19   Height:      Weight:      SpO2: 98% 96% 95% 95%   Weight change:   Intake/Output Summary (Last 24 hours) at 10/02/14 1557 Last data filed at 10/02/14 0700  Gross per 24 hour  Intake    900 ml  Output      0 ml  Net    900 ml   Physical Exam: General: Obese elderly white female, alert, cooperative, NAD. HEENT: PERRL, EOMI. Moist mucus membranes Neck: Full range of motion without pain, supple, no lymphadenopathy or carotid bruits Lungs: Clear to ascultation bilaterally, normal work of respiration, no wheezes, rales, rhonchi Heart: RRR, no murmurs, gallops, or rubs Abdomen: Soft, non-tender, non-distended, BS +, laparoscopic port sites clean dry and intact Extremities: No cyanosis, clubbing, or edema Neurologic: Alert & oriented x3, cranial nerves II-XII intact, strength grossly intact, sensation intact to light touch   Lab Results: Basic Metabolic Panel:  Recent Labs Lab 10/01/14 0539 10/02/14 0543  NA 136 137  K 3.3* 4.0  CL 108 105  CO2 22 24  GLUCOSE 89 103*  BUN 10 6  CREATININE 0.59 0.68  CALCIUM 8.0* 8.1*  MG 1.8 1.9  PHOS 2.4* 3.5   Liver Function Tests:  Recent Labs Lab 10/01/14 0539 10/02/14 0543  AST 17 21  ALT 20 21  ALKPHOS 64 62  BILITOT 1.2 0.7  PROT 5.7* 5.2*  ALBUMIN 2.6* 2.4*    Recent Labs Lab 09/30/14 0450 10/01/14 0539  LIPASE 19* 23    CBC:  Recent Labs Lab 09/27/14 0147  10/01/14 0539 10/02/14 0543  WBC 15.1*  < > 11.3* 11.5*  NEUTROABS 11.9*  --   --   --   HGB 14.0  < >  11.5* 11.7*  HCT 44.2  < > 36.1 37.2  MCV 93.2  < > 91.6 92.5  PLT 181  < > 202 235  < > = values in this interval not displayed. Urinalysis:  Recent Labs Lab 09/27/14 0416  COLORURINE YELLOW  LABSPEC 1.020  PHURINE 5.0  GLUCOSEU NEGATIVE  HGBUR TRACE*  BILIRUBINUR SMALL*  KETONESUR 15*  PROTEINUR NEGATIVE  UROBILINOGEN 0.2  NITRITE NEGATIVE  LEUKOCYTESUR SMALL*   Micro Results: Recent Results (from the past 240 hour(s))  MRSA PCR Screening     Status: Abnormal   Collection Time: 09/27/14  5:03 AM  Result Value Ref Range Status   MRSA by PCR POSITIVE (A) NEGATIVE Final    Comment: CRITICAL RESULT CALLED TO, READ BACK BY AND VERIFIED WITH: Boyd KerbsK BECKNER AT [id number] 09/27/14 Lucienne CapersS YARBROUGH        The GeneXpert MRSA Assay (FDA approved for NASAL specimens only), is one component of a comprehensive MRSA colonization surveillance program. It is not intended to diagnose MRSA infection nor to guide or monitor treatment for MRSA infections.      Studies/Results: No results found. Medications: I have reviewed the patient's current medications. Scheduled Meds: . metoprolol tartrate  25 mg Oral  BID  . sodium chloride  3 mL Intravenous Q12H   Continuous Infusions: . sodium chloride 75 mL/hr at 10/02/14 0329  . lactated ringers 10 mL/hr at 10/01/14 1329   PRN Meds:.morphine injection, ondansetron **OR** ondansetron (ZOFRAN) IV, oxyCODONE-acetaminophen   Assessment/Plan: 74 y/o F w/ PMHx of HTN, Depression, ?hypothyroidism, and PAF, admitted for pancreatitis.   Pancreatitis w/ Choledocholitiasis: Status post laparoscopic cholecystectomy yesterday and s/p ERCP w/ sphincterotomy and stone extraction 09/27/2014. Tolerating full diet. -Appreciate surgical management. -Start full diet -Morphine 1-2 mg q4h prn -Zofran prn for nausea  Tachycardia w/ PAF: No recurrent episodes. -Cardiology following, appreciate recommendations -metoprolol  BID -Resume eliquis upon  discharge. -Continue cardiac monitoring  HTN: Stable. On Lisinopril 10 mg daily at home.  -Resume ACE inhibitor upon discharge.  HLD: On Zocor 10 mg qhs at home. -Resume simvastatin upon discharge.  VTE PPx: SCD's  Dispo: Plan for today.  The patient does have a current PCP Baltazar Apo, MD) and does need an [facility] hospital follow-up appointment after discharge.  The patient does not have transportation limitations that hinder transportation to clinic appointments.  .Services Needed at time of discharge: Y = Yes, Blank = No PT:   OT:   RN:   Equipment:   Other:     LOS: 5 days   Harold Barban, MD 10/02/2014, 3:57 PM

## 2014-10-02 NOTE — Anesthesia Postprocedure Evaluation (Signed)
  Anesthesia Post-op Note  Patient: Lisa Haynes  Procedure(s) Performed: Procedure(s): LAPAROSCOPIC CHOLECYSTECTOMY  (N/A)  Patient Location: PACU  Anesthesia Type:General  Level of Consciousness: awake, alert  and oriented  Airway and Oxygen Therapy: Patient Spontanous Breathing  Post-op Pain: mild  Post-op Assessment: Post-op Vital signs reviewed  Post-op Vital Signs: Reviewed  Last Vitals:  Filed Vitals:   10/02/14 1355  BP: 123/75  Pulse: 85  Temp: 36.7 C  Resp: 19    Complications: No apparent anesthesia complications

## 2014-10-02 NOTE — Progress Notes (Signed)
Patient ID: Lisa Haynes, female   DOB: 19-May-1940, 74 y.o.   MRN: [id number] 1 Day Post-Op  Subjective: Pt has some soreness at her umbilicus, but otherwise just ate a huge portion of food for breakfast with no issues.  Not needing much for pain.  Having trouble mobilizing   Objective: Vital signs in last 24 hours: Temp:  [97.3 F (36.3 C)-98.3 F (36.8 C)] 98.3 F (36.8 C) (06/01 0552) Pulse Rate:  [67-95] 71 (06/01 0552) Resp:  [12-25] 16 (06/01 0552) BP: (113-181)/(41-86) 113/68 mmHg (06/01 0552) SpO2:  [92 %-100 %] 95 % (06/01 0552) FiO2 (%):  [2 %] 2 % (05/31 1736) Last BM Date: 09/30/14  Intake/Output from previous day: 05/31 0701 - 06/01 0700 In: 900 [I.V.:900] Out: -  Intake/Output this shift:    PE: Abd: soft, appropriately tender, +BS, ND, incisions c/d/i  Lab Results:   Recent Labs  10/01/14 0539 10/02/14 0543  WBC 11.3* 11.5*  HGB 11.5* 11.7*  HCT 36.1 37.2  PLT 202 235   BMET  Recent Labs  10/01/14 0539 10/02/14 0543  NA 136 137  K 3.3* 4.0  CL 108 105  CO2 22 24  GLUCOSE 89 103*  BUN 10 6  CREATININE 0.59 0.68  CALCIUM 8.0* 8.1*   PT/INR No results for input(s): LABPROT, INR in the last 72 hours. CMP     Component Value Date/Time   NA 137 10/02/2014 0543   K 4.0 10/02/2014 0543   CL 105 10/02/2014 0543   CO2 24 10/02/2014 0543   GLUCOSE 103* 10/02/2014 0543   BUN 6 10/02/2014 0543   CREATININE 0.68 10/02/2014 0543   CREATININE 0.77 08/20/2014 1158   CALCIUM 8.1* 10/02/2014 0543   PROT 5.2* 10/02/2014 0543   ALBUMIN 2.4* 10/02/2014 0543   AST 21 10/02/2014 0543   ALT 21 10/02/2014 0543   ALKPHOS 62 10/02/2014 0543   BILITOT 0.7 10/02/2014 0543   GFRNONAA >60 10/02/2014 0543   GFRNONAA 75 08/20/2014 1158   GFRAA >60 10/02/2014 0543   GFRAA 87 08/20/2014 1158   Lipase     Component Value Date/Time   LIPASE 23 10/01/2014 0539       Studies/Results: No results found.  Anti-infectives: Anti-infectives    Start      Dose/Rate Route Frequency Ordered Stop   10/01/14 1345  ceFAZolin (ANCEF) IVPB 2 g/50 mL premix     2 g 100 mL/hr over 30 Minutes Intravenous To Surgery 09/30/14 1311 10/01/14 1415       Assessment/Plan  POD 1, s/p lap chole for gallstone pancreatitis -patient doing well surgically.  She will likely need PT eval prior to discharge as she lives with her son, but he works.  She is having trouble mobilizing today -tolerating solid diet and pain control.   -surgically stable for dc home when medically stable. -follow up will be arranged for patient   LOS: 5 days    Lisa Haynes 10/02/2014, 9:01 AM Pager: [phone]

## 2014-10-02 NOTE — Progress Notes (Signed)
[facility] reports runs of SVT. NP on call notified via text.

## 2014-10-02 NOTE — Progress Notes (Signed)
Patient seen and examined. Case d/w residents in detail. I agree with findings and plan as documented in Dr. Wyatt HasteNgo's note.  Patient is s/p lap chole yesterday and is tolerating PO today and is stable for d/c home per surgery.   Resume eliquis for afib and c/w metoprolol.  Patient stable for d/c home today

## 2014-10-02 NOTE — Discharge Instructions (Signed)
[facility] ______[facility] °LAPAROSCOPIC SURGERY: POST OP INSTRUCTIONS °Always review your discharge instruction sheet given to you by the facility where your surgery was performed. °IF YOU HAVE DISABILITY OR FAMILY LEAVE FORMS, YOU MUST BRING THEM TO THE OFFICE FOR PROCESSING.   °DO NOT GIVE THEM TO YOUR DOCTOR. ° °1. A prescription for pain medication may be given to you upon discharge.  Take your pain medication as prescribed, if needed.  If narcotic pain medicine is not needed, then you may take acetaminophen (Tylenol) or ibuprofen (Advil) as needed. °2. Take your usually prescribed medications unless otherwise directed. °3. If you need a refill on your pain medication, please contact your pharmacy.  They will contact our office to request authorization. Prescriptions will not be filled after 5pm or on week-ends. °4. You should follow a light diet the first few days after arrival home, such as soup and crackers, etc.  Be sure to include lots of fluids daily. °5. Most patients will experience some swelling and bruising in the area of the incisions.  Ice packs will help.  Swelling and bruising can take several days to resolve.  °6. It is common to experience some constipation if taking pain medication after surgery.  Increasing fluid intake and taking a stool softener (such as Colace) will usually help or prevent this problem from occurring.  A mild laxative (Milk of Magnesia or Miralax) should be taken according to package instructions if there are no bowel movements after 48 hours. °7. Unless discharge instructions indicate otherwise, you may remove your bandages 24-48 hours after surgery, and you may shower at that time.  You may have steri-strips (small skin tapes) in place directly over the incision.  These strips should be left on the skin for 7-10 days.  If your surgeon used skin glue on the incision, you may shower in 24 hours.  The glue will flake off over the next 2-3 weeks.  Any sutures or  staples will be removed at the office during your follow-up visit. °8. ACTIVITIES:  You may resume regular (light) daily activities beginning the next day--such as daily self-care, walking, climbing stairs--gradually increasing activities as tolerated.  You may have sexual intercourse when it is comfortable.  Refrain from any heavy lifting or straining until approved by your doctor. °a. You may drive when you are no longer taking prescription pain medication, you can comfortably wear a seatbelt, and you can safely maneuver your car and apply brakes. °b. RETURN TO WORK:  __________________________________________________________ °9. You should see your doctor in the office for a follow-up appointment approximately 2-3 weeks after your surgery.  Make sure that you call for this appointment within a day or two after you arrive home to insure a convenient appointment time. °10. OTHER INSTRUCTIONS: __________________________________________________________________________________________________________________________ __________________________________________________________________________________________________________________________ °WHEN TO CALL YOUR DOCTOR: °1. Fever over 101.0 °2. Inability to urinate °3. Continued bleeding from incision. °4. Increased pain, redness, or drainage from the incision. °5. Increasing abdominal pain ° °The clinic staff is available to answer your questions during regular business hours.  Please don’t hesitate to call and ask to speak to one of the nurses for clinical concerns.  If you have a medical emergency, go to the nearest emergency room or call 911.  A surgeon from [facility] is always on call at the hospital. °[address], [address], [place], [place]  [address] ? [address] [address], [place],    [address] °[phone] ? [phone] ? FAX [phone] °Web site:   [email or web address] °

## 2014-10-02 NOTE — Progress Notes (Signed)
Pt discharged at 9:30pm, discharge instruction given to son Mr, Lisa Haynes

## 2014-10-03 NOTE — Discharge Summary (Signed)
Name: Lisa Haynes MRN: [id number] DOB: Feb 15, 1941 74 y.o. PCP: Baltazar Apo, MD  Date of Admission: 09/27/2014  1:15 AM Date of Discharge: 10/02/2014 Attending Physician: Earl Lagos  Discharge Diagnosis: Principal Problem:   Acute recurrent pancreatitis Active Problems:   Hyperlipidemia   Essential hypertension   GERD   Cholelithiasis   Paroxysmal atrial fibrillation  Discharge Medications:   Medication List    TAKE these medications        acetaminophen 325 MG tablet  Commonly known as:  TYLENOL  Take 2 tablets (650 mg total) by mouth every 6 (six) hours as needed for pain.     apixaban 5 MG Tabs tablet  Commonly known as:  ELIQUIS  Take 1 tablet (5 mg total) by mouth 2 (two) times daily.     aspirin EC 81 MG tablet  Take 81 mg by mouth daily.     Calcium Carbonate-Vitamin D 600-400 MG-UNIT per tablet  Commonly known as:  CALCARB 600/D  Take 1 tablet by mouth 3 (three) times daily with meals.     lisinopril 10 MG tablet  Commonly known as:  PRINIVIL,ZESTRIL  TAKE 1 TABLET BY MOUTH EVERY DAY     metoprolol tartrate 25 MG tablet  Commonly known as:  LOPRESSOR  Take 1 tablet (25 mg total) by mouth 2 (two) times daily.     multivitamin with minerals tablet  Take 1 tablet by mouth daily.     omeprazole 20 MG capsule  Commonly known as:  PRILOSEC  Take 1 capsule (20 mg total) by mouth daily.     oxyCODONE-acetaminophen 5-325 MG per tablet  Commonly known as:  PERCOCET/ROXICET  Take 1-2 tablets by mouth every 4 (four) hours as needed for moderate pain.     sertraline 50 MG tablet  Commonly known as:  ZOLOFT  Take 1 tablet (50 mg total) by mouth at bedtime.     simvastatin 10 MG tablet  Commonly known as:  ZOCOR  TAKE 1 TABLET BY MOUTH AT BEDTIME        Disposition and follow-up:   Lisa Haynes was discharged from [facility] in Stable condition.  At the hospital follow up visit please address:  Acute recurrent  pancreatitis and cholelithiasis: Would ensure proper follow up with general surgery as scheduled.   Paroxysmal atrial fibrillation: Would ensure compliance with eliquis and lopressor.   GERD:  Omeprazole was resumed on her medications list upon discharge, but would consider taking off again as an outpatient as I see now on this writing that patient had not reported taking it and had no symptoms upon initial admission.  2.  Labs / imaging needed at time of follow-up: none  3.  Pending labs/ test needing follow-up: none  Follow-up Appointments: Follow-up Information    Follow up with [facility] On 10/22/2014.   Specialty:  General Surgery   Why:  [facility], 1:45pm, arrive no later than 1:15pm for paperwork   Contact information:   [address] [place] [address] [phone]       Follow up with Griffin Basil, MD.   Specialty:  Internal Medicine   Why:  June 8 at 9:15 AM   Contact information:   [address] [place] [address] [phone]       Discharge Instructions:   Consultations: Treatment Team:  Md Ccs, MD  Procedures Performed:  Ct Abdomen Pelvis W Contrast  09/27/2014  CLINICAL DATA:  Abdominal pain  EXAM: CT ABDOMEN AND PELVIS WITH CONTRAST  TECHNIQUE: Multidetector CT imaging of the abdomen and pelvis was performed using the standard protocol following bolus administration of intravenous contrast.  CONTRAST:  80mL OMNIPAQUE IOHEXOL 300 MG/ML  SOLN  COMPARISON:  06/20/2014  FINDINGS: BODY WALL: No contributory findings.  LOWER CHEST: No contributory findings.  ABDOMEN/PELVIS:  Liver: 14 mm lesion in the medial left liver has mildly indistinct margins but is stable since 2008. A tiny low-density in the posterior right liver on image 18 is also stable and considered incidental. Possible mild hepatic steatosis.  Biliary: Cholelithiasis. There is a newly seen stone in the proximal common bile duct. No CBD dilation. No evidence  of cholecystitis.  Pancreas: Peripancreatic edema and diffuse pancreatic expansion. No necrosis or organized collection. No vascular complication.  Spleen: Unremarkable.  Adrenals: Unremarkable.  Kidneys and ureters: No hydronephrosis or stone.  Bladder: Chronic mild bladder wall thickening and indistinctness.  Reproductive: Hysterectomy and oophorectomies. Marked pelvic floor laxity.  Bowel: No obstruction. The appendix is distended with stool, but there is no acute inflammatory change. Extensive distal colonic diverticulosis.  Retroperitoneum: No mass or adenopathy.  Peritoneum: No ascites or pneumoperitoneum.  Vascular: No acute abnormality.  OSSEOUS: No acute abnormalities.  IMPRESSION: 1. Interstitial edematous pancreatitis, likely biliary given cholelithiasis and choledocholithiasis. 2. Chronic findings are noted above.   Electronically Signed   By: Marnee Spring M.D.   On: 09/27/2014 03:31   Nm Myocar Multi W/spect W/wall Motion / Ef  09/28/2014    This is a low risk study.  The study is normal.  The left ventricular ejection fraction is normal (55-65%).    Dg Ercp Biliary & Pancreatic Ducts  09/27/2014   CLINICAL DATA:  Choledocholithiasis.  EXAM: ERCP  TECHNIQUE: Multiple spot images obtained with the fluoroscopic device and submitted for interpretation post-procedure.  COMPARISON:  CT of the abdomen on 09/27/2014  FINDINGS: Imaging during the procedure obtained with a C-arm demonstrates endoscopic cannulation of the common bile duct with cholangiogram demonstrating potential subtle filling defects in the proximal common bile duct. A balloon sweep maneuver was performed.  IMPRESSION: ERCP demonstrates possible calculus in the proximal common bile duct. A balloon sweep extraction maneuver was performed.  These images were submitted for radiologic interpretation only. Please see the procedural report for the amount of contrast and the fluoroscopy time utilized.   Electronically Signed   By: Irish Lack M.D.   On: 09/27/2014 14:14    2D Echo: none  Cardiac Cath: none  Admission HPI:   Lisa Haynes is a 74 year old woman with history of pancreatitis, paroxysmal atrial fibrillation, HTN, HL, GERD who presents with abdominal pain. Of note, she was recently hospitalized from 2/18-2/20/16 for acute pancreatitis due to gallstone. On hospital follow-up, she was referred to general surgery for evaluation of possible elective cholecystectomy. She saw [facility] 09/10/14 who wanted cardiac clearance before proceeding with cholecystectomy. She says they mentioned she needs to be off the eliquis for possible surgery and interpreted this to mean to stop taking it and she has not since that appointment. She has not yet gone to cardiologist because her ride is out of town until July.   Tonight, she was in her usual state of health resting in bed trying to sleep when she had acute onset of diffuse, constant abdominal pain that radiates to back. She also has associated nausea with several episodes of emesis. She cannot see well  so could not tell if it was bloody or bilious. She says she has had some small but loose bowel movement earlier tonight but could not see if there was melena or hematachezia. She says she has decreased appetite but earlier tonight ate a chicken pot pie and a sandwich from [facility] [employer] [facility] [employer]. She denies fevers, chills, night sweats, chest pain, SOB, dysuria, weakness. She does not drink alcohol or use illicit drugs. During interview she was no longer in pain.  In the ED, she received NS 500 cc bolus and infusion at 75 cc/hr, morphine 4 mg iv once, zofran 4 mg iv once. She had lipase >3000 with abdominal CT noting interstitial edematous pancreatitis likely biliary given cholelithiasis and choledocholithiasis.   Hospital Course by problem list: Principal Problem:   Acute recurrent pancreatitis Active Problems:   Hyperlipidemia   Essential hypertension   GERD   Cholelithiasis    Paroxysmal atrial fibrillation   Acute recurrent pancreatitis and cholelithiasis: Lisa Haynes's abdominal pain is due to pancreatitis with lipase >3000 and evidence of pancreatitis on her CT abd/pelvis. APACHE2 score of 9 on admission is 8% mortality rate but this is without ABG. This is likely due to cholelithiasis and choledocholithiasis. She was admitted to telemetry. She was started on normal saline at 250 mL an hour. She was made NPO. She was provided morphine as needed and Zofran as needed. She was seen in consultation by general surgery. Gastroenterology was consulted. She underwent ERCP with sphincterotomy and stone extraction on 09/27/2014. Her pancreatitis improved and her diet was advanced. Her fluids were decreased to 75 mL per hour. She proceeded to the operating room on 10/01/2014 for laparoscopic cholecystectomy. No complications noted and her surgery. She was transferred back to telemetry. Her postop course was unremarkable. Her diet was advanced. Would ensure proper follow up with general surgery as scheduled.   Paroxysmal atrial fibrillation: At home she was supposed to be on eliquis 5 mg bid but she has not taken eliquis in over two weeks since she saw surgery and was told she will need to be off anti-coagulation. She was seen in consultation by cardiology for preoperative evaluation. Lexiscan Myoview on 09/28/2014 was noted to be normal and low risk study. Her left ventricular ejection fraction was normal. She was noted on 09/29/2014 to have episodes of intermittent tachycardia. EKG was sinus rhythm with frequent PACs. She was discharged on Lopressor at 25 mg daily. Would ensure compliance with eliquis and lopressor.   Essential hypertension: Her home lisinopril 10 mg daily was held during admission and resumed upon discharge.   GERD: Lisa Haynes says she does not have reflux and has not been taking her omeprazole 20 mg daily. This was held during her admission. This was resumed on her  medications list upon discharge, but would consider taking off again as an outpatient.   Hyperlipidemia: Last lipid profile 06/2014 with cholesterol 121, LDL 74, HDL 32, and triglyceride 77. Her home simvastatin 10 mg qhs was restarted when her diet was restarted.  Discharge Vitals:   BP 123/75 mmHg  Pulse 85  Temp(Src) 98.1 F (36.7 C) (Oral)  Resp 19  Ht 4\' 11"  (1.499 m)  Wt 223 lb 6.4 oz (101.334 kg)  BMI 45.10 kg/m2  SpO2 95%  Discharge Labs:  No results found for this or any previous visit (from the past 24 hour(s)).  Signed: Harold BarbanLawrence Zenna Traister, MD 10/03/2014, 4:15 PM    Services Ordered on Discharge: none Equipment Ordered on Discharge: none

## 2014-10-03 NOTE — Telephone Encounter (Signed)
Hi lawrence,  i think you recently discharged Lisa Haynes. i have a prescription request to refill her lopressor. Any chance there was any changes to this from admission? i saw there is documentation on 6/1 evening that she had runs of svt but that NP was notified? But then she was discharged a few hours after. Let me know if she is still on 25mg  bid and i can refill it unless it was done on discharge.   Thanks!  Janavia Rottman

## 2014-10-07 ENCOUNTER — Ambulatory Visit: Payer: Medicare Other | Admitting: Cardiology

## 2014-10-09 ENCOUNTER — Ambulatory Visit: Payer: Medicare Other | Admitting: Pulmonary Disease

## 2014-10-09 ENCOUNTER — Ambulatory Visit: Payer: Medicare Other | Admitting: Internal Medicine

## 2014-10-09 LAB — NM MYOCAR MULTI W/SPECT W/WALL MOTION / EF
CHL CUP NUCLEAR SDS: 9
LV dias vol: 57 mL
LV sys vol: 18 mL
NUC STRESS EF: 69 %
RATE: 0.43
SRS: 1
SSS: 10
TID: 0.84

## 2014-10-14 ENCOUNTER — Other Ambulatory Visit: Payer: Self-pay | Admitting: *Deleted

## 2014-10-14 NOTE — Telephone Encounter (Signed)
I believe she has refills until September i believe. Is that correct?

## 2014-10-15 ENCOUNTER — Telehealth: Payer: Self-pay | Admitting: Pulmonary Disease

## 2014-10-15 NOTE — Telephone Encounter (Signed)
Call to patient to confirm appointment for 6/15 at 2:15 lmtcb ° °

## 2014-10-16 ENCOUNTER — Ambulatory Visit (INDEPENDENT_AMBULATORY_CARE_PROVIDER_SITE_OTHER): Payer: Medicare Other | Admitting: Pulmonary Disease

## 2014-10-16 ENCOUNTER — Other Ambulatory Visit: Payer: Self-pay | Admitting: *Deleted

## 2014-10-16 ENCOUNTER — Encounter: Payer: Self-pay | Admitting: Pulmonary Disease

## 2014-10-16 VITALS — BP 140/70 | HR 70 | Temp 98.4°F | Ht 59.0 in | Wt 207.6 lb

## 2014-10-16 DIAGNOSIS — Z7901 Long term (current) use of anticoagulants: Secondary | ICD-10-CM | POA: Diagnosis not present

## 2014-10-16 DIAGNOSIS — F418 Other specified anxiety disorders: Secondary | ICD-10-CM

## 2014-10-16 DIAGNOSIS — I480 Paroxysmal atrial fibrillation: Secondary | ICD-10-CM | POA: Diagnosis present

## 2014-10-16 DIAGNOSIS — Z9049 Acquired absence of other specified parts of digestive tract: Secondary | ICD-10-CM

## 2014-10-16 MED ORDER — SIMVASTATIN 10 MG PO TABS: 10.0000 mg | ORAL_TABLET | Freq: Every day | ORAL | Status: DC

## 2014-10-16 NOTE — Telephone Encounter (Signed)
Spoke to pharmacy, the old script could only be for 12 months not the extra, the script has expired and pt needs refills

## 2014-10-16 NOTE — Telephone Encounter (Signed)
Patient brought in her medications today and is still taking her simvastatin. Will refill it for her.

## 2014-10-16 NOTE — Patient Instructions (Addendum)
Follow up with [facility] Tuesday, 10/22/2014 at 1:15PM [address] [place] [address] [phone]

## 2014-10-16 NOTE — Telephone Encounter (Signed)
Hi jennifer,  You are seeing [name] in clinic this afternoon. If time permitting, can you please ask her about her statin and if she wishes to continue? Or if she is even taking? She tends to not take all her medications at times and there is a refill request pending.   Thanks,  [employer]

## 2014-10-17 NOTE — Assessment & Plan Note (Signed)
Recovering well.  Plan: -Follow up with surgery next week

## 2014-10-17 NOTE — Assessment & Plan Note (Signed)
Doing well and reports improvement with Zoloft 50 mg daily

## 2014-10-17 NOTE — Progress Notes (Signed)
Subjective:   Patient ID: Lisa Haynes, female    DOB: [birth year], 74 y.o.   MRN: [id number]  HPI Ms. LUCIANNA Haynes is a 74 year old woman with history of paroxysmal A. fib, hypertension, hyperlipidemia, GERD who presents for follow-up.  She was recently hospitalized from 09/27/2014-10/02/2014 for recurrent pancreatitis. She underwent ERCP with sphincterotomy and stone extraction on 09/27/2014. She proceeded to the operating room on 10/01/2014 for laparoscopic cholecystectomy.   She reports she is doing well. Pain improved greatly. She only requires pain medication at night. Diarrhea improves. She denies fevers chills, chest pain, shortness of breath, nausea vomiting, constipation, dysuria. Tolerating by mouth intake.  Review of Systems Constitutional: no fevers/chills Eyes: no vision changes Ears, nose, mouth, throat, and face: no cough Respiratory: no shortness of breath Cardiovascular: no chest pain Gastrointestinal: no nausea/vomiting, no abdominal pain, no constipation, no diarrhea Genitourinary: no dysuria, no hematuria Integument: no rash Hematologic/lymphatic: no bleeding/bruising, no edema Musculoskeletal: no arthralgias, no myalgias Neurological: no paresthesias, no weakness  Past Medical History  Diagnosis Date  . Bilateral knee pain   . Depression   . Hypertension   . Low back pain   . Osteoarthritis   . Osteoporosis   . Hypothyroidism   . Pneumonia     hx of PNA 2013  . Incontinence   . Paroxysmal atrial fibrillation 08/20/2014    Current Outpatient Prescriptions on File Prior to Visit  Medication Sig Dispense Refill  . acetaminophen (TYLENOL) 325 MG tablet Take 2 tablets (650 mg total) by mouth every 6 (six) hours as needed for pain. 60 tablet 3  . apixaban (ELIQUIS) 5 MG TABS tablet Take 1 tablet (5 mg total) by mouth 2 (two) times daily. 60 tablet 1  . aspirin EC 81 MG tablet Take 81 mg by mouth daily.    . Calcium Carbonate-Vitamin D (CALCARB 600/D) 600-400  MG-UNIT per tablet Take 1 tablet by mouth 3 (three) times daily with meals. (Patient taking differently: Take 1 tablet by mouth daily. ) 30 tablet 2  . lisinopril (PRINIVIL,ZESTRIL) 10 MG tablet TAKE 1 TABLET BY MOUTH EVERY DAY 90 tablet 0  . metoprolol tartrate (LOPRESSOR) 25 MG tablet TAKE 1 TABLET(25 MG) BY MOUTH TWICE DAILY 60 tablet 0  . Multiple Vitamins-Minerals (MULTIVITAMIN WITH MINERALS) tablet Take 1 tablet by mouth daily.    Marland Kitchen oxyCODONE-acetaminophen (PERCOCET/ROXICET) 5-325 MG per tablet Take 1-2 tablets by mouth every 4 (four) hours as needed for moderate pain. 30 tablet 0  . sertraline (ZOLOFT) 50 MG tablet Take 1 tablet (50 mg total) by mouth at bedtime. (Patient taking differently: Take 25 mg by mouth at bedtime. ) 30 tablet 1   No current facility-administered medications on file prior to visit.    Today's Vitals   10/16/14 1426 10/16/14 1456  BP: 161/61 140/70  Pulse: 54 70  Temp: 98.4 F (36.9 C)   TempSrc: Oral   Height: 4\' 11"  (1.499 m)   Weight: 207 lb 9.6 oz (94.167 kg)   SpO2: 98%   PainSc: 0-No pain     Objective:  Physical Exam General Apperance: NAD HEENT: Normocephalic, atraumatic, PERRL, EOMI, anicteric sclera Neck: Supple, trachea midline Lungs: Clear to auscultation bilaterally. No wheezes, rhonchi or rales. Breathing comfortably Heart: Regular rate and rhythm, no murmur/rub/gallop Abdomen: Soft, nontender, nondistended, no rebound/guarding. Incisions clean/dry/intact. Extremities: Normal, atraumatic, warm and well perfused, no edema Pulses: 2+ throughout Skin: No rashes or lesions Neurologic: Alert and oriented x 3. CNII-XII intact. Normal strength and sensation  Assessment & Plan:  Please refer to problem based charting.

## 2014-10-17 NOTE — Telephone Encounter (Signed)
Hi [name],  Dr. Joaquin Music note says controlled on 50mg  daily, however epic on the med list says patient taking 25mg  daily. Can we please clarify with the patient the correct dose she is taking so we know which to refill?  Thanks,  Dr q

## 2014-10-17 NOTE — Assessment & Plan Note (Signed)
She is taking her Eliquis.

## 2014-10-18 NOTE — Progress Notes (Signed)
Internal Medicine Clinic Attending  Case discussed with Dr. Krall at the time of the visit.  We reviewed the resident's history and exam and pertinent patient test results.  I agree with the assessment, diagnosis, and plan of care documented in the resident's note.  

## 2014-10-31 ENCOUNTER — Ambulatory Visit (INDEPENDENT_AMBULATORY_CARE_PROVIDER_SITE_OTHER): Payer: Medicare Other | Admitting: Physician Assistant

## 2014-10-31 ENCOUNTER — Encounter: Payer: Self-pay | Admitting: Physician Assistant

## 2014-10-31 VITALS — BP 138/70 | HR 62 | Ht 59.0 in | Wt 207.8 lb

## 2014-10-31 DIAGNOSIS — I480 Paroxysmal atrial fibrillation: Secondary | ICD-10-CM | POA: Diagnosis not present

## 2014-10-31 DIAGNOSIS — E785 Hyperlipidemia, unspecified: Secondary | ICD-10-CM

## 2014-10-31 DIAGNOSIS — I10 Essential (primary) hypertension: Secondary | ICD-10-CM | POA: Diagnosis not present

## 2014-10-31 NOTE — Patient Instructions (Signed)
Medication Instructions:  Your physician has recommended you make the following change in your medication:  1. STOP Aspirin  Labwork: No new orders.   Testing/Procedures: No new orders.   Follow-Up: Your physician wants you to follow-up in: 3-4 MONTHS with Dr Anne FuSkains.  You will receive a reminder letter in the mail two months in advance. If you don't receive a letter, please call our office to schedule the follow-up appointment.   Any Other Special Instructions Will Be Listed Below (If Applicable).

## 2014-10-31 NOTE — Progress Notes (Signed)
Cardiology Office Note   Date:  10/31/2014   ID:  Lisa Haynes, DOB Dec 24, 1940, MRN [id number]  PCP:  Lisa Haynes, Samaya, MD  Cardiologist: Dr. Anne Haynes Chief Complaint: post hospital f/u.    History of Present Illness: Lisa Haynes is a 74 y.o. female who presents for post hospital follow-up. The patient was admitted with acute pancreatitis 09/27/14 and underwent ERCP with sphincterotomy and stone retrieval that day. She then underwent cholecystectomy. We were consult for preoperative evaluation. She is Lisa Haynes 2 weeks prior to arrival because of her impending surgery. She has a history of hypertension, hyperlipidemia, GERD, paroxysmal atrial fibrillation, morbid obesity and hypothyroidism.CHA2DS2-Vasc score is 4. 2-D echo in 06/2014 EF 60% with no wall motion abnormality. Myoview 09/28/14 showed low risk study EF 55-65%. She had some PSVT in the hospital metoprolol was increased to 25 mg twice a day. She also had PAT at times it looked like a flutter pattern.  Patient comes in today without cardiac complaints. She denies any chest pain, palpitations, dyspnea, dyspnea on exertion, dizziness or presyncope. She's recovered well from her surgery.  Past Medical History  Diagnosis Date  . Bilateral knee pain   . Depression   . Hypertension   . Low back pain   . Osteoarthritis   . Osteoporosis   . Hypothyroidism   . Pneumonia     hx of PNA 2013  . Incontinence   . Paroxysmal atrial fibrillation 08/20/2014    Past Surgical History  Procedure Laterality Date  . Vaginal hysterectomy      with unilateral oophorectomy  . Appendectomy    . Ercp N/A 09/27/2014    Procedure: ENDOSCOPIC RETROGRADE CHOLANGIOPANCREATOGRAPHY (ERCP);  Surgeon: Lisa HawkingPatrick Hung, MD;  [facility];  Service: Endoscopy;  Laterality: N/A;  . Cholecystectomy N/A 10/01/2014    Procedure: LAPAROSCOPIC CHOLECYSTECTOMY ;  Surgeon: Lisa Bouillonhomas Cornett, MD;  Location: [facility] OR;  Service: General;  Laterality: N/A;      Current Outpatient Prescriptions  Medication Sig Dispense Refill  . acetaminophen (TYLENOL) 325 MG tablet Take 2 tablets (650 mg total) by mouth every 6 (six) hours as needed for pain. 60 tablet 3  . apixaban (ELIQUIS) 5 MG TABS tablet Take 1 tablet (5 mg total) by mouth 2 (two) times daily. 60 tablet 1  . aspirin EC 81 MG tablet Take 81 mg by mouth daily.    . Calcium Carbonate-Vitamin D (CALCARB 600/D) 600-400 MG-UNIT per tablet Take 1 tablet by mouth 3 (three) times daily with meals. (Patient taking differently: Take 1 tablet by mouth daily. ) 30 tablet 2  . lisinopril (PRINIVIL,ZESTRIL) 10 MG tablet TAKE 1 TABLET BY MOUTH EVERY DAY 90 tablet 0  . metoprolol tartrate (LOPRESSOR) 25 MG tablet TAKE 1 TABLET(25 MG) BY MOUTH TWICE DAILY 60 tablet 0  . Multiple Vitamins-Minerals (MULTIVITAMIN WITH MINERALS) tablet Take 1 tablet by mouth daily.    Marland Kitchen. oxyCODONE-acetaminophen (PERCOCET/ROXICET) 5-325 MG per tablet Take 1-2 tablets by mouth every 4 (four) hours as needed for moderate pain. 30 tablet 0  . sertraline (ZOLOFT) 50 MG tablet Take 1 tablet (50 mg total) by mouth at bedtime. (Patient taking differently: Take 25 mg by mouth at bedtime. ) 30 tablet 1  . simvastatin (ZOCOR) 10 MG tablet Take 1 tablet (10 mg total) by mouth at bedtime. 90 tablet 3   No current facility-administered medications for this visit.    Allergies:   Review of patient's allergies indicates no known allergies.    Social History:  The patient  reports that she has never smoked. She has never used smokeless tobacco. She reports that she does not drink alcohol or use illicit drugs.   Family History:  The patient's family history includes Heart disease in her mother; Heart disease (age of onset: 66) in her brother.    ROS:  Please see the history of present illness.   Otherwise, review of systems are positive for none.   All other systems are reviewed and negative.    PHYSICAL EXAM: VS:  BP 138/70 mmHg  Pulse  62  Ht  (1.499 m)  Wt 207 lb 12.8 oz (94.257 kg)  BMI 41.95 kg/m2 , BMI Body mass index is 41.95 kg/(m^2). GEN: Well nourished, well developed, in no acute distress Neck: no JVD, HJR, carotid bruits, or masses Cardiac: RRR; no murmurs,gallop, rubs, thrill or heave,  Respiratory:  clear to auscultation bilaterally, normal work of breathing GI: soft, nontender, nondistended, + BS MS: no deformity or atrophy Extremities: without cyanosis, clubbing, edema, good distal pulses bilaterally.  Skin: warm and dry, no rash Neuro:  Strength and sensation are intact    EKG:  EKG is ordered today. The ekg ordered today demonstrates sinus bradycardia 57 bpm with PVC poor R wave progression, no acute change   Recent Labs: 06/20/2014: B Natriuretic Peptide 350.1* 09/29/2014: TSH 4.855* 10/02/2014: ALT 21; BUN 6; Creatinine, Ser 0.68; Hemoglobin 11.7*; Magnesium 1.9; Platelets 235; Potassium 4.0; Sodium 137    Lipid Panel    Component Value Date/Time   CHOL 121 06/21/2014 0530   TRIG 77 06/21/2014 0530   HDL 32* 06/21/2014 0530   CHOLHDL 3.8 06/21/2014 0530   VLDL 15 06/21/2014 0530   LDLCALC 74 06/21/2014 0530      Wt Readings from Last 3 Encounters:  10/31/14 207 lb 12.8 oz (94.257 kg)  10/16/14 207 lb 9.6 oz (94.167 kg)  10/01/14 223 lb 6.4 oz (101.334 kg)      Other studies Reviewed: Additional studies/ records that were reviewed today include and review of the records demonstrates:   2-D echo 06/21/14  Study Conclusions  - Left ventricle: The cavity size was normal. Wall thickness was   increased in a pattern of mild LVH. The estimated ejection   fraction was 60%. Wall motion was normal; there were no regional   wall motion abnormalities. - Right ventricle: The cavity size was normal. Systolic function   was normal                          Stress Myoview 09/28/14 This is a low risk study.  The study is normal.  The left ventricular ejection fraction is normal  (55-65%).     ASSESSMENT AND PLAN: Paroxysmal atrial fibrillation Patient is maintaining normal sinus rhythm and increase metoprolol and Eliquis. For some reason she is also taking an aspirin a day. DC baby aspirin. Follow-up with Dr. Anne Fu in 3-4 months.  Essential hypertension Blood pressure stable     Signed, Jacolyn Reedy, PA-C  10/31/2014 11:23 AM    [facility] [address] [place], [place], [place]  [address] Phone: [phone]; Fax: [phone]

## 2014-10-31 NOTE — Assessment & Plan Note (Signed)
Patient is maintaining normal sinus rhythm and increase metoprolol and Eliquis. For some reason she is also taking an aspirin a day. DC baby aspirin. Follow-up with Dr. Anne FuSkains in 3-4 months.

## 2014-10-31 NOTE — Assessment & Plan Note (Signed)
Blood pressure stable ? ?

## 2014-11-12 ENCOUNTER — Ambulatory Visit: Payer: Medicare Other | Admitting: Cardiology

## 2014-11-24 ENCOUNTER — Other Ambulatory Visit: Payer: Self-pay | Admitting: Internal Medicine

## 2014-11-24 DIAGNOSIS — I10 Essential (primary) hypertension: Secondary | ICD-10-CM

## 2014-12-24 ENCOUNTER — Other Ambulatory Visit: Payer: Self-pay | Admitting: Internal Medicine

## 2014-12-24 DIAGNOSIS — I480 Paroxysmal atrial fibrillation: Secondary | ICD-10-CM

## 2014-12-25 ENCOUNTER — Other Ambulatory Visit: Payer: Self-pay | Admitting: Internal Medicine

## 2014-12-25 DIAGNOSIS — F418 Other specified anxiety disorders: Secondary | ICD-10-CM

## 2015-01-24 ENCOUNTER — Other Ambulatory Visit: Payer: Self-pay | Admitting: Internal Medicine

## 2015-01-24 DIAGNOSIS — I480 Paroxysmal atrial fibrillation: Secondary | ICD-10-CM

## 2015-02-27 ENCOUNTER — Other Ambulatory Visit: Payer: Self-pay | Admitting: Internal Medicine

## 2015-02-27 DIAGNOSIS — I10 Essential (primary) hypertension: Secondary | ICD-10-CM

## 2015-02-28 ENCOUNTER — Other Ambulatory Visit: Payer: Self-pay | Admitting: Internal Medicine

## 2015-02-28 DIAGNOSIS — I10 Essential (primary) hypertension: Secondary | ICD-10-CM

## 2015-03-10 ENCOUNTER — Ambulatory Visit (INDEPENDENT_AMBULATORY_CARE_PROVIDER_SITE_OTHER): Payer: Medicare Other | Admitting: Cardiology

## 2015-03-10 ENCOUNTER — Encounter: Payer: Self-pay | Admitting: Cardiology

## 2015-03-10 VITALS — BP 116/82 | HR 72 | Ht 61.0 in | Wt 211.8 lb

## 2015-03-10 DIAGNOSIS — I480 Paroxysmal atrial fibrillation: Secondary | ICD-10-CM | POA: Diagnosis not present

## 2015-03-10 DIAGNOSIS — I10 Essential (primary) hypertension: Secondary | ICD-10-CM

## 2015-03-10 DIAGNOSIS — I471 Supraventricular tachycardia: Secondary | ICD-10-CM

## 2015-03-10 MED ORDER — ASPIRIN EC 81 MG PO TBEC: 81.0000 mg | DELAYED_RELEASE_TABLET | Freq: Every day | ORAL | Status: DC

## 2015-03-10 NOTE — Patient Instructions (Signed)
Medication Instructions:  Please start ASA 81 mg a day. Continue all other medications as listed.  Follow-Up: Follow up in 6 months with Dr. Anne FuSkains.  You will receive a letter in the mail 2 months before you are due.  Please call us when you receive this letter to schedule your follow up appointment.  If you need a refill on your cardiac medications before your next appointment, please call your pharmacy.  Thank you for choosing [facility]!!

## 2015-03-10 NOTE — Progress Notes (Signed)
Cardiology Office Note   Date:  03/10/2015   ID:  Lisa Haynes, DOB Apr 09, 1941, MRN [id number]  PCP:  Ruben Im, MD  Cardiologist:   Donato Schultz, MD       History of Present Illness: Lisa Haynes is a 74 y.o. female here for evaluation of paroxysmal atrial fibrillation. She was hospitalized in late May 2016 for recurrent pancreatitis had stone extraction ERCP. on 06/20/2014 at 11:15 AM, EKG demonstrates atrial fibrillation with rapid ventricular response. This promptly resolved. Subsequent EKG show normal sinus rhythm. This was an isolated incident in the setting of food poisoning, gallbladder issues.. Has not been on anticoagulation with Eliquis 5 mg 2 times a day. She has not resumed since her gallbladder surgery. She is still taking aspirin.  I saw her in the hospital. Had paroxysmal atrial tachycardia on monitor at that time.  CHA2DS2-Vasc score 4-5 (age, sex, HTN +/- h/o HF)  Since 06/20/14, she's not had any recorded atrial fibrillation.  She also has morbid obesity, hypertension, hyperlipidemia.  She has been feeling well. She has not been taking anticoagulation since her gallbladder surgery. After close inspection of her medical record, I do not see any further evidence of atrial fibrillation. She did have paroxysmal atrial tachycardia at times in the hospital but not A. fib.   Past Medical History  Diagnosis Date  . Bilateral knee pain   . Depression   . Hypertension   . Low back pain   . Osteoarthritis   . Osteoporosis   . Hypothyroidism   . Pneumonia     hx of PNA 2013  . Incontinence   . Paroxysmal atrial fibrillation (HCC) 08/20/2014    Past Surgical History  Procedure Laterality Date  . Vaginal hysterectomy      with unilateral oophorectomy  . Appendectomy    . Ercp N/A 09/27/2014    Procedure: ENDOSCOPIC RETROGRADE CHOLANGIOPANCREATOGRAPHY (ERCP);  Surgeon: Jeani Hawking, MD;  Location: [facility] ENDOSCOPY;  Service: Endoscopy;  Laterality: N/A;  .  Cholecystectomy N/A 10/01/2014    Procedure: LAPAROSCOPIC CHOLECYSTECTOMY ;  Surgeon: Harriette Bouillon, MD;  Location: [facility] OR;  Service: General;  Laterality: N/A;     Current Outpatient Prescriptions  Medication Sig Dispense Refill  . acetaminophen (TYLENOL) 325 MG tablet Take 2 tablets (650 mg total) by mouth every 6 (six) hours as needed for pain. 60 tablet 3  . Calcium Carbonate-Vitamin D (CALCARB 600/D) 600-400 MG-UNIT per tablet Take 1 tablet by mouth 3 (three) times daily with meals. (Patient taking differently: Take 1 tablet by mouth daily. ) 30 tablet 2  . lisinopril (PRINIVIL,ZESTRIL) 10 MG tablet TAKE 1 TABLET BY MOUTH DAILY 90 tablet 0  . metoprolol tartrate (LOPRESSOR) 25 MG tablet TAKE 1 TABLET BY MOUTH TWICE DAILY 60 tablet 0  . Multiple Vitamins-Minerals (MULTIVITAMIN WITH MINERALS) tablet Take 1 tablet by mouth daily.    . sertraline (ZOLOFT) 50 MG tablet TAKE 1/2 TABLET(25 MG) BY MOUTH AT BEDTIME 30 tablet 1  . simvastatin (ZOCOR) 10 MG tablet Take 1 tablet (10 mg total) by mouth at bedtime. 90 tablet 3  . apixaban (ELIQUIS) 5 MG TABS tablet Take 1 tablet (5 mg total) by mouth 2 (two) times daily. (Patient not taking: Reported on 03/10/2015) 60 tablet 1  . sertraline (ZOLOFT) 50 MG tablet Take 1 tablet (50 mg total) by mouth at bedtime. (Patient not taking: Reported on 03/10/2015) 30 tablet 1   No current facility-administered medications for this visit.    Allergies:  Review of patient's allergies indicates no known allergies.    Social History:  The patient  reports that she has never smoked. She has never used smokeless tobacco. She reports that she does not drink alcohol or use illicit drugs.   Family History:  The patient's family history includes Heart disease in her mother; Heart disease (age of onset: 6073) in her brother.    ROS:  Please see the history of present illness.   Otherwise, review of systems are positive for none.   All other systems are reviewed and  negative.    PHYSICAL EXAM: VS:  BP 116/82 mmHg  Pulse 72  Ht 5\' 1"  (1.549 m)  Wt 211 lb 12.8 oz (96.072 kg)  BMI 40.04 kg/m2 , BMI Body mass index is 40.04 kg/(m^2). GEN: Well nourished, well developed, in no acute distress HEENT: normal Neck: no JVD, carotid bruits, or masses Cardiac: RRR; no murmurs, rubs, or gallops,no edema  Respiratory:  clear to auscultation bilaterally, normal work of breathing GI: soft, nontender, nondistended, + BS overweight  MS: no deformity or atrophy Skin: warm and dry, no rash Neuro:  Strength and sensation are intact Psych: euthymic mood, full affect   EKG:  Today 03/10/15-sinus rhythm, 67, poor R-wave progression, low precordial lead voltage. No evidence of atrial fibrillation since 06/20/14 EKG. Personally viewed.    Recent Labs: 06/20/2014: B Natriuretic Peptide 350.1* 09/29/2014: TSH 4.855* 10/02/2014: ALT 21; BUN 6; Creatinine, Ser 0.68; Hemoglobin 11.7*; Magnesium 1.9; Platelets 235; Potassium 4.0; Sodium 137    Lipid Panel    Component Value Date/Time   CHOL 121 06/21/2014 0530   TRIG 77 06/21/2014 0530   HDL 32* 06/21/2014 0530   CHOLHDL 3.8 06/21/2014 0530   VLDL 15 06/21/2014 0530   LDLCALC 74 06/21/2014 0530      Wt Readings from Last 3 Encounters:  03/10/15 211 lb 12.8 oz (96.072 kg)  10/31/14 207 lb 12.8 oz (94.257 kg)  10/16/14 207 lb 9.6 oz (94.167 kg)      Other studies Reviewed: Additional studies/ records that were reviewed today include: Prior office records, hospital records, lab work, EKGs reviewed.. Review of the above records demonstrates: None   ASSESSMENT AND PLAN:  Paroxysmal atrial fibrillation  - We have not seen any evidence of age fibrillation since the 06/20/14 EKG which was in the setting of gallstone pancreatitis. This is a potentially reversible cause. She has been off her anticoagulation since her gallstone surgery. We will go ahead and hold off of anticoagulation for now unless there is future  occurrence of atrial fibrillation. Most recent EKGs are all reassuring showing normal rhythm.  - On metoprolol 25 mg twice a day.  - Has also had paroxysmal atrial tachycardia in hospital.  Morbid obesity  - To need to encourage weight loss.  - Increases risk for paroxysmal atrial fibrillation  Chronic anticoagulation  - As above. Risks of bleeding.  Essential hypertension  - Well controlled.  Obesity   - encourage weight loss  Current medicines are reviewed at length with the patient today.  The patient does not have concerns regarding medicines.  The following changes have been made:  no change  Labs/ tests ordered today include:  No orders of the defined types were placed in this encounter.     Disposition:   FU with Lisa Haynes  in 6 months  Signed, Donato SchultzSKAINS, Lisa Silverstein, MD  03/10/2015 3:59 PM    [facility] [address], [place], [place]  [address] Phone: [phone]; Fax: [phone]

## 2015-04-04 ENCOUNTER — Other Ambulatory Visit: Payer: Self-pay | Admitting: Internal Medicine

## 2015-04-04 DIAGNOSIS — I480 Paroxysmal atrial fibrillation: Secondary | ICD-10-CM

## 2015-04-26 ENCOUNTER — Other Ambulatory Visit: Payer: Self-pay | Admitting: Internal Medicine

## 2015-04-26 DIAGNOSIS — F418 Other specified anxiety disorders: Secondary | ICD-10-CM

## 2015-05-22 ENCOUNTER — Encounter: Payer: Medicare Other | Admitting: Internal Medicine

## 2015-06-03 ENCOUNTER — Other Ambulatory Visit: Payer: Self-pay | Admitting: Internal Medicine

## 2015-06-03 DIAGNOSIS — I10 Essential (primary) hypertension: Secondary | ICD-10-CM

## 2015-07-10 ENCOUNTER — Encounter: Payer: Self-pay | Admitting: Internal Medicine

## 2015-07-10 ENCOUNTER — Encounter: Payer: Medicare Other | Admitting: Internal Medicine

## 2015-07-24 ENCOUNTER — Telehealth: Payer: Self-pay | Admitting: Internal Medicine

## 2015-07-24 NOTE — Telephone Encounter (Signed)
APPT. REMINDER CALL, NO ANSWER, NO VOICE MAIL °

## 2015-07-25 ENCOUNTER — Ambulatory Visit: Payer: Medicare Other | Admitting: Internal Medicine

## 2015-08-05 ENCOUNTER — Ambulatory Visit (INDEPENDENT_AMBULATORY_CARE_PROVIDER_SITE_OTHER): Payer: Commercial Managed Care - HMO | Admitting: Internal Medicine

## 2015-08-05 ENCOUNTER — Encounter: Payer: Self-pay | Admitting: Internal Medicine

## 2015-08-05 VITALS — BP 155/67 | HR 61 | Temp 97.4°F | Ht 60.0 in | Wt 216.1 lb

## 2015-08-05 DIAGNOSIS — I10 Essential (primary) hypertension: Secondary | ICD-10-CM | POA: Diagnosis not present

## 2015-08-05 DIAGNOSIS — W19XXXD Unspecified fall, subsequent encounter: Secondary | ICD-10-CM

## 2015-08-05 DIAGNOSIS — W19XXXA Unspecified fall, initial encounter: Secondary | ICD-10-CM | POA: Insufficient documentation

## 2015-08-05 DIAGNOSIS — K219 Gastro-esophageal reflux disease without esophagitis: Secondary | ICD-10-CM | POA: Insufficient documentation

## 2015-08-05 DIAGNOSIS — M810 Age-related osteoporosis without current pathological fracture: Secondary | ICD-10-CM

## 2015-08-05 DIAGNOSIS — Z9181 History of falling: Secondary | ICD-10-CM

## 2015-08-05 DIAGNOSIS — R296 Repeated falls: Secondary | ICD-10-CM

## 2015-08-05 MED ORDER — ALENDRONATE SODIUM 70 MG PO TABS: 70.0000 mg | ORAL_TABLET | ORAL | Status: DC

## 2015-08-05 MED ORDER — OMEPRAZOLE 20 MG PO CPDR: 20.0000 mg | DELAYED_RELEASE_CAPSULE | Freq: Every day | ORAL | Status: DC

## 2015-08-05 NOTE — Patient Instructions (Signed)
STOP taking metoprolol 25mg .   Start taking alendronate once a week.   Osteoporosis Osteoporosis happens when your bones become thinner and weaker. Weak bones can break (fracture) more easily when you slip or fall. Bones most at risk of breaking are in the hip, wrist, and spine. HOME CARE  Get enough calcium and vitamin D. These nutrients are good for your bones.  Exercise as told by your doctor.  Do not use any tobacco products. This includes cigarettes, chewing tobacco, and electronic cigarettes. If you need help quitting, ask your doctor.  Limit the amount of alcohol you drink.  Take medicines only as told by your doctor.  Keep all follow-up visits as told by your doctor. This is important.  Take care at home to prevent falls. Some ways to do this are:  Keep rooms well lit and tidy.  Put safety rails on your stairs.  Put a rubber mat in the bathroom and other places that are often wet or slippery. GET HELP RIGHT AWAY IF:  You fall.  You hurt yourself.   This information is not intended to replace advice given to you by your health care provider. Make sure you discuss any questions you have with your health care provider.   Document Released: 07/12/2011 Document Revised: 05/10/2014 Document Reviewed: 09/27/2013 Elsevier Interactive Patient Education [employer]2016 Elsevier Inc.

## 2015-08-05 NOTE — Assessment & Plan Note (Signed)
Pt fell in your bathtub 5 days ago while reaching over for her shampoo. Patient sits in a bath chair while showering, she states the next thing she remembers after reaching for her shampoo was being next to her bath chair holding on the chair rail. She denies any presyncopal symptoms. EMS was called and felt that patient was stable, they advised pt to stay awake as long as possible before going to sleep. Since the fall patient feels her normal self. Her niece in the room states pt has had 6 falls within the past 9 months. Pt denies CP, SOB, and chest palpitations. She lives with her son who cooks and cleans for her. She is refusing home health aid to assist w/ ADL such as bathing stating she does not want anyone to see her naked. On exam she has a 1inch in diameter hematoma in the center of her forehead, pupils reactive to light b/l, and not other bruising noted. She is hesitant to try physical therapy but agreeable to referral. She has a walker at home. Possible causes of frequent falls are deconditioning, cardiac arrhythmias, orthostatic hypotension, and polypharmacy.   - referral placed for physical therapy. - stopped metoprolol to cut down on medications as she does not have paroxysmal afib and being beta blocked could contribute decreased reflex tachycardia when she stands.  - will f/u in 3 months to reassess falls.

## 2015-08-05 NOTE — Progress Notes (Signed)
Subjective:   Patient ID: Lisa Haynes female   DOB: 1940/09/22 75 y.o.   MRN: [id number]  HPI: [name] is a 75 y.o. with past medical history as outlined below who presents to clinic for GERD medication refill.   Please see problem list for status of the pt's chronic medical problems.  Past Medical History  Diagnosis Date  . Bilateral knee pain   . Depression   . Hypertension   . Low back pain   . Osteoarthritis   . Osteoporosis   . Hypothyroidism   . Pneumonia     hx of PNA 2013  . Incontinence   . Paroxysmal atrial fibrillation (HCC) 08/20/2014   Current Outpatient Prescriptions  Medication Sig Dispense Refill  . acetaminophen (TYLENOL) 325 MG tablet Take 2 tablets (650 mg total) by mouth every 6 (six) hours as needed for pain. 60 tablet 3  . aspirin EC 81 MG tablet Take 1 tablet (81 mg total) by mouth daily. 90 tablet 3  . Calcium Carbonate-Vitamin D (CALCARB 600/D) 600-400 MG-UNIT per tablet Take 1 tablet by mouth 3 (three) times daily with meals. (Patient taking differently: Take 1 tablet by mouth daily. ) 30 tablet 2  . lisinopril (PRINIVIL,ZESTRIL) 10 MG tablet TAKE 1 TABLET BY MOUTH DAILY 90 tablet 3  . metoprolol tartrate (LOPRESSOR) 25 MG tablet TAKE 1 TABLET BY MOUTH TWICE DAILY 60 tablet 3  . Multiple Vitamins-Minerals (MULTIVITAMIN WITH MINERALS) tablet Take 1 tablet by mouth daily.    Marland Kitchen. omeprazole (PRILOSEC) 20 MG capsule Take 1 capsule (20 mg total) by mouth daily. 30 capsule 1  . sertraline (ZOLOFT) 50 MG tablet TAKE 1/2 TABLET BY MOUTH AT BEDTIME 30 tablet 6  . simvastatin (ZOCOR) 10 MG tablet Take 1 tablet (10 mg total) by mouth at bedtime. 90 tablet 3   No current facility-administered medications for this visit.   Family History  Problem Relation Age of Onset  . Heart disease Brother 5473  . Heart disease Mother     onset 10880s   Social History   Social History  . Marital Status: Widowed    Spouse Name: N/A  . Number of Children: N/A  .  Years of Education: N/A   Occupational History  . Retired    Social History Main Topics  . Smoking status: Never Smoker   . Smokeless tobacco: Never Used  . Alcohol Use: No  . Drug Use: No  . Sexual Activity: Not Asked   Other Topics Concern  . None   Social History Narrative   Retired: Motel work   Divorced    Regular exercise- no   Lives alone   Review of Systems: Review of Systems  Respiratory: Negative for shortness of breath.   Cardiovascular: Negative for chest pain and palpitations.  Gastrointestinal: Positive for heartburn, nausea (night time nausea) and diarrhea (chronic, takes imodium for this). Negative for vomiting and abdominal pain.  Genitourinary: Negative for dysuria.  Neurological: Positive for dizziness (occasional dizziness when standing up, denies current dizziness). Negative for focal weakness.    Objective:  Physical Exam: Filed Vitals:   08/05/15 1439  BP: 155/67  Pulse: 61  Temp: 97.4 F (36.3 C)  TempSrc: Oral  Height: 5' (1.524 m)  Weight: 216 lb 1.6 oz (98.022 kg)  SpO2: 100%   Physical Exam  Constitutional: She appears well-developed and well-nourished. No distress.  HENT:  Head: Normocephalic and atraumatic.  Nose: Nose normal.  1 inch in diameter hematoma in  center of forehead   Eyes: Conjunctivae and EOM are normal. Pupils are equal, round, and reactive to light. Right eye exhibits no discharge. Left eye exhibits no discharge. No scleral icterus.  Cardiovascular: Normal rate, regular rhythm and normal heart sounds.  Exam reveals no gallop and no friction rub.   No murmur heard. Pulmonary/Chest: Effort normal and breath sounds normal. No respiratory distress. She has no wheezes. She has no rales.  Abdominal: Soft. Bowel sounds are normal. She exhibits no distension and no mass. There is no tenderness. There is no rebound and no guarding.  Obese   Skin: Skin is warm and dry. No rash noted. She is not diaphoretic. No erythema. No  pallor.  Psychiatric: She has a normal mood and affect. Her behavior is normal. Judgment and thought content normal.    Assessment & Plan:   Please see problem based assessment and plan.

## 2015-08-05 NOTE — Assessment & Plan Note (Signed)
BP Readings from Last 3 Encounters:  08/05/15 155/67  03/10/15 116/82  10/31/14 138/70    Lab Results  Component Value Date   NA 137 10/02/2014   K 4.0 10/02/2014   CREATININE 0.68 10/02/2014    Assessment: Blood pressure control:  controlled Progress toward BP goal:   at goal Comments: on lisinopril 10 and metoprolol 25mg   Plan: Medications:  Stopped metoprolol which was started 09/2014 for presumed afib w/ RVR as it was confirmed that pt did not have afib but actually atrial tachycardia. Also she has been in NSR the past 2 visits to cardiology. Likely being beta blocked has contributed to her frequent falls. Continue lisinopril.  Educational resources provided:   Self management tools provided:   Other plans: pt prefers to f/u in clinic for BP check in 3 months rather than 1 month due to transportation issues. Advised pt to come in sooner if needed, otherwise will see pt in 3 months.

## 2015-08-05 NOTE — Assessment & Plan Note (Signed)
Pt reports substernal heart burn and night time nausea. Denies night time cough, mucous/metallic taste in mouth, and abdominal bloating. She requested a refill of her GERD medication from her pharmacy that was denied. Prilosec was continued on d/c from hospital in May 2016 as she reported not taking it. She is now requesting refill of her prilosec.   - given rx for prilosec 20mg  qAM

## 2015-08-05 NOTE — Assessment & Plan Note (Signed)
Pt was dx in 2006 w/ osteoporosis. She has tried risedronate but could not tolerate it due to SE. Renal function and LFTs wnl June 2016.  - rx for fosamax 70mg  q7days.

## 2015-08-06 NOTE — Progress Notes (Signed)
Internal Medicine Clinic Attending  Case discussed with Dr. Truong at the time of the visit.  We reviewed the resident's history and exam and pertinent patient test results.  I agree with the assessment, diagnosis, and plan of care documented in the resident's note.  

## 2015-08-18 ENCOUNTER — Encounter: Payer: Self-pay | Admitting: *Deleted

## 2015-09-01 ENCOUNTER — Other Ambulatory Visit: Payer: Self-pay | Admitting: Internal Medicine

## 2015-09-01 DIAGNOSIS — K219 Gastro-esophageal reflux disease without esophagitis: Secondary | ICD-10-CM

## 2015-09-25 ENCOUNTER — Other Ambulatory Visit: Payer: Self-pay | Admitting: Internal Medicine

## 2015-09-25 DIAGNOSIS — I480 Paroxysmal atrial fibrillation: Secondary | ICD-10-CM

## 2015-09-25 DIAGNOSIS — I10 Essential (primary) hypertension: Secondary | ICD-10-CM

## 2015-10-29 ENCOUNTER — Encounter: Payer: Self-pay | Admitting: *Deleted

## 2015-11-11 ENCOUNTER — Observation Stay (HOSPITAL_COMMUNITY)
Admission: AD | Admit: 2015-11-11 | Discharge: 2015-11-13 | Disposition: A | Discharge status: Home / Self Care | Payer: Commercial Managed Care - HMO | Source: Ambulatory Visit | Attending: Internal Medicine | Admitting: Internal Medicine

## 2015-11-11 ENCOUNTER — Encounter (HOSPITAL_COMMUNITY): Payer: Self-pay | Admitting: General Practice

## 2015-11-11 ENCOUNTER — Ambulatory Visit (HOSPITAL_COMMUNITY)
Admission: AD | Admit: 2015-11-11 | Discharge: 2015-11-11 | Disposition: A | Discharge status: Home / Self Care | Payer: Commercial Managed Care - HMO | Source: Ambulatory Visit | Admitting: Internal Medicine

## 2015-11-11 ENCOUNTER — Ambulatory Visit (INDEPENDENT_AMBULATORY_CARE_PROVIDER_SITE_OTHER): Payer: Commercial Managed Care - HMO | Admitting: Internal Medicine

## 2015-11-11 ENCOUNTER — Encounter: Payer: Self-pay | Admitting: Internal Medicine

## 2015-11-11 VITALS — BP 151/95 | HR 70 | Temp 98.0°F | Wt 212.1 lb

## 2015-11-11 DIAGNOSIS — R0789 Other chest pain: Secondary | ICD-10-CM | POA: Diagnosis not present

## 2015-11-11 DIAGNOSIS — Z7983 Long term (current) use of bisphosphonates: Secondary | ICD-10-CM | POA: Insufficient documentation

## 2015-11-11 DIAGNOSIS — W19XXXA Unspecified fall, initial encounter: Secondary | ICD-10-CM | POA: Diagnosis present

## 2015-11-11 DIAGNOSIS — R109 Unspecified abdominal pain: Secondary | ICD-10-CM

## 2015-11-11 DIAGNOSIS — Z79899 Other long term (current) drug therapy: Secondary | ICD-10-CM | POA: Insufficient documentation

## 2015-11-11 DIAGNOSIS — F418 Other specified anxiety disorders: Secondary | ICD-10-CM | POA: Diagnosis not present

## 2015-11-11 DIAGNOSIS — E039 Hypothyroidism, unspecified: Secondary | ICD-10-CM | POA: Diagnosis present

## 2015-11-11 DIAGNOSIS — R000 Tachycardia, unspecified: Secondary | ICD-10-CM | POA: Diagnosis not present

## 2015-11-11 DIAGNOSIS — E7800 Pure hypercholesterolemia, unspecified: Secondary | ICD-10-CM | POA: Diagnosis not present

## 2015-11-11 DIAGNOSIS — I10 Essential (primary) hypertension: Secondary | ICD-10-CM | POA: Diagnosis not present

## 2015-11-11 DIAGNOSIS — K529 Noninfective gastroenteritis and colitis, unspecified: Secondary | ICD-10-CM

## 2015-11-11 DIAGNOSIS — K915 Postcholecystectomy syndrome: Secondary | ICD-10-CM | POA: Insufficient documentation

## 2015-11-11 DIAGNOSIS — N3946 Mixed incontinence: Secondary | ICD-10-CM

## 2015-11-11 DIAGNOSIS — R296 Repeated falls: Secondary | ICD-10-CM | POA: Diagnosis not present

## 2015-11-11 DIAGNOSIS — M810 Age-related osteoporosis without current pathological fracture: Secondary | ICD-10-CM | POA: Diagnosis not present

## 2015-11-11 DIAGNOSIS — I471 Supraventricular tachycardia: Secondary | ICD-10-CM | POA: Insufficient documentation

## 2015-11-11 DIAGNOSIS — R079 Chest pain, unspecified: Secondary | ICD-10-CM

## 2015-11-11 DIAGNOSIS — K219 Gastro-esophageal reflux disease without esophagitis: Secondary | ICD-10-CM | POA: Insufficient documentation

## 2015-11-11 DIAGNOSIS — I110 Hypertensive heart disease with heart failure: Secondary | ICD-10-CM | POA: Insufficient documentation

## 2015-11-11 DIAGNOSIS — Z7982 Long term (current) use of aspirin: Secondary | ICD-10-CM | POA: Insufficient documentation

## 2015-11-11 DIAGNOSIS — D649 Anemia, unspecified: Secondary | ICD-10-CM | POA: Diagnosis not present

## 2015-11-11 DIAGNOSIS — Z6838 Body mass index (BMI) 38.0-38.9, adult: Secondary | ICD-10-CM | POA: Diagnosis not present

## 2015-11-11 DIAGNOSIS — I509 Heart failure, unspecified: Secondary | ICD-10-CM | POA: Insufficient documentation

## 2015-11-11 DIAGNOSIS — I208 Other forms of angina pectoris: Secondary | ICD-10-CM | POA: Diagnosis not present

## 2015-11-11 DIAGNOSIS — I480 Paroxysmal atrial fibrillation: Secondary | ICD-10-CM

## 2015-11-11 DIAGNOSIS — Z8249 Family history of ischemic heart disease and other diseases of the circulatory system: Secondary | ICD-10-CM | POA: Diagnosis not present

## 2015-11-11 DIAGNOSIS — I4892 Unspecified atrial flutter: Secondary | ICD-10-CM | POA: Insufficient documentation

## 2015-11-11 HISTORY — DX: Gastro-esophageal reflux disease without esophagitis: K21.9

## 2015-11-11 HISTORY — DX: Unspecified urinary incontinence: R32

## 2015-11-11 HISTORY — DX: Pure hypercholesterolemia, unspecified: E78.00

## 2015-11-11 LAB — CBC
HCT: 46.8 % — ABNORMAL HIGH (ref 36.0–46.0)
HEMOGLOBIN: 14.7 g/dL (ref 12.0–15.0)
MCH: 29 pg (ref 26.0–34.0)
MCHC: 31.4 g/dL (ref 30.0–36.0)
MCV: 92.3 fL (ref 78.0–100.0)
Platelets: 215 10*3/uL (ref 150–400)
RBC: 5.07 MIL/uL (ref 3.87–5.11)
RDW: 14 % (ref 11.5–15.5)
WBC: 9.4 10*3/uL (ref 4.0–10.5)

## 2015-11-11 LAB — COMPREHENSIVE METABOLIC PANEL
ALK PHOS: 73 U/L (ref 38–126)
ALT: 11 U/L — AB (ref 14–54)
AST: 20 U/L (ref 15–41)
Albumin: 4 g/dL (ref 3.5–5.0)
Anion gap: 6 (ref 5–15)
BILIRUBIN TOTAL: 1 mg/dL (ref 0.3–1.2)
BUN: 12 mg/dL (ref 6–20)
CALCIUM: 9.7 mg/dL (ref 8.9–10.3)
CHLORIDE: 105 mmol/L (ref 101–111)
CO2: 26 mmol/L (ref 22–32)
CREATININE: 0.85 mg/dL (ref 0.44–1.00)
Glucose, Bld: 90 mg/dL (ref 65–99)
Potassium: 4.8 mmol/L (ref 3.5–5.1)
Sodium: 137 mmol/L (ref 135–145)
TOTAL PROTEIN: 7.3 g/dL (ref 6.5–8.1)

## 2015-11-11 LAB — POCT URINALYSIS DIPSTICK
BILIRUBIN UA: NEGATIVE
Glucose, UA: NEGATIVE
KETONES UA: NEGATIVE
Nitrite, UA: NEGATIVE
Protein, UA: NEGATIVE
Spec Grav, UA: 1.01
Urobilinogen, UA: 0.2
pH, UA: 5.5

## 2015-11-11 LAB — T4, FREE: Free T4: 0.71 ng/dL (ref 0.61–1.12)

## 2015-11-11 LAB — TROPONIN I

## 2015-11-11 LAB — TSH: TSH: 4.671 u[IU]/mL — ABNORMAL HIGH (ref 0.350–4.500)

## 2015-11-11 MED ORDER — CALCIUM CARBONATE-VITAMIN D 500-200 MG-UNIT PO TABS
1.0000 | ORAL_TABLET | Freq: Every day | ORAL | Status: DC
  Administered 2015-11-12 – 2015-11-13 (×2): 1 via ORAL
  Filled 2015-11-11 (×2): qty 1

## 2015-11-11 MED ORDER — SODIUM CHLORIDE 0.9% FLUSH
3.0000 mL | Freq: Two times a day (BID) | INTRAVENOUS | Status: DC
  Administered 2015-11-11 – 2015-11-12 (×3): 3 mL via INTRAVENOUS

## 2015-11-11 MED ORDER — LISINOPRIL 10 MG PO TABS
10.0000 mg | ORAL_TABLET | Freq: Every day | ORAL | Status: DC
  Administered 2015-11-12 – 2015-11-13 (×2): 10 mg via ORAL
  Filled 2015-11-11 (×2): qty 1

## 2015-11-11 MED ORDER — SERTRALINE HCL 25 MG PO TABS
25.0000 mg | ORAL_TABLET | Freq: Every day | ORAL | Status: DC
  Administered 2015-11-11 – 2015-11-12 (×2): 25 mg via ORAL
  Filled 2015-11-11 (×2): qty 1

## 2015-11-11 MED ORDER — ALENDRONATE SODIUM 70 MG PO TABS: 70.0000 mg | ORAL_TABLET | ORAL | Status: DC

## 2015-11-11 MED ORDER — ADULT MULTIVITAMIN W/MINERALS CH
1.0000 | ORAL_TABLET | Freq: Every day | ORAL | Status: DC
  Administered 2015-11-12 – 2015-11-13 (×2): 1 via ORAL
  Filled 2015-11-11 (×2): qty 1

## 2015-11-11 MED ORDER — ENOXAPARIN SODIUM 40 MG/0.4ML ~~LOC~~ SOLN
40.0000 mg | SUBCUTANEOUS | Status: DC
  Administered 2015-11-11 – 2015-11-12 (×2): 40 mg via SUBCUTANEOUS
  Filled 2015-11-11 (×2): qty 0.4

## 2015-11-11 MED ORDER — PANTOPRAZOLE SODIUM 40 MG PO TBEC
40.0000 mg | DELAYED_RELEASE_TABLET | Freq: Every day | ORAL | Status: DC
  Administered 2015-11-12 – 2015-11-13 (×2): 40 mg via ORAL
  Filled 2015-11-11 (×2): qty 1

## 2015-11-11 MED ORDER — CALCIUM CARBONATE-VITAMIN D 600-400 MG-UNIT PO TABS: 1.0000 | ORAL_TABLET | Freq: Every day | ORAL | Status: DC

## 2015-11-11 MED ORDER — ASPIRIN EC 81 MG PO TBEC
81.0000 mg | DELAYED_RELEASE_TABLET | Freq: Every day | ORAL | Status: DC
  Administered 2015-11-12 – 2015-11-13 (×2): 81 mg via ORAL
  Filled 2015-11-11 (×2): qty 1

## 2015-11-11 MED ORDER — SIMVASTATIN 10 MG PO TABS
10.0000 mg | ORAL_TABLET | Freq: Every day | ORAL | Status: DC
  Administered 2015-11-11 – 2015-11-12 (×2): 10 mg via ORAL
  Filled 2015-11-11 (×2): qty 1

## 2015-11-11 MED ORDER — ACETAMINOPHEN 325 MG PO TABS
650.0000 mg | ORAL_TABLET | Freq: Four times a day (QID) | ORAL | Status: DC | PRN
  Administered 2015-11-12: 650 mg via ORAL
  Filled 2015-11-11: qty 2

## 2015-11-11 NOTE — Progress Notes (Signed)

## 2015-11-11 NOTE — Progress Notes (Addendum)
CC:Lisa Haynes is a 75 y.o. with past medical history as outlined below who presents to clinic for follow up of pAfib medication management and mentions new complaints of periodic chest pain and chronic diarrhea.   HPI: Please see problem list for status of the pt's chronic medical problems.  Pt has been experiencing chest pain, left arm pain, diaphoresis, light-headedness and palpitations. The chest pain has occured with exertion and when she experienced anxiety attacks for years but now she has started to experience it even at rest. Over the past week her chest pain has been 6/10 and began radiating to her left arm. The most recent episode of chest pain was last night while she was lying in bed. When she experiences the chest pain on exertion it is only relieved with rest sometimes. She has also noticed that her shoes don't fit recently possibly due to increased lower extremity edema. Denies paroxysmal dyspnea but has started using 2 pillows since her last visit which is up from her usual 1 pillow at baseline.   She had an episode of syncope 07/2015 where she passed out in the shower and hit her head. She could not remember whether she experienced prodromal symptoms such as palpitations, lightheadedness or nausea but denies post-ictal state. She woke up with her son knocking on the door saying she had been in the shower for an hour and a half. She had hit her head and had an "egg" EMS was called and recommended that she be transported to the ED however she declined.   She has a history of afib noted on hospital admission for gallstone pancreatitis in 06/2014 she was started on metoprolol and Apixiban but cardiology recommended stopping ASA and Apixiban due to the afib being a one time finding. Pt states she still been taking her metoprolol and took it prior to today's office visit.  Patients daughter is concerned that Ms. Karam has been less able to take care of herself and perform her ADLs.  She asked about Me[employer] on wheels.      Past Medical History  Diagnosis Date  . Bilateral knee pain   . Depression   . Hypertension   . Low back pain   . Osteoarthritis   . Osteoporosis   . Hypothyroidism   . Pneumonia     hx of PNA 2013  . Incontinence   . Paroxysmal atrial fibrillation (HCC) 08/20/2014    Review of Systems:    Review of Systems  Constitutional: Negative for fever.  Respiratory: Positive for shortness of breath. Negative for cough and wheezing.   Cardiovascular: Positive for chest pain, palpitations and leg swelling. Negative for PND.  Gastrointestinal: Positive for heartburn and diarrhea. Negative for nausea, vomiting, abdominal pain and melena.  Genitourinary: Positive for dysuria and frequency. Negative for hematuria.  Musculoskeletal: Positive for falls.  Neurological: Positive for dizziness, loss of consciousness, weakness and headaches. Negative for seizures.  Psychiatric/Behavioral: The patient is nervous/anxious.     Physical Exam:  Filed Vitals:   11/11/15 1515  BP: 168/127  Pulse: 114  Temp: 98 F (36.7 C)  TempSrc: Oral  Weight: 212 lb 1.6 oz (96.208 kg)  SpO2: 98%   Physical Exam  Constitutional: She appears well-developed and well-nourished. No distress.  Neck: No JVD present.  Cardiovascular: An irregularly irregular rhythm present. Tachycardia present.   No murmur heard. Respiratory: Breath sounds normal. She has no wheezes.  GI: Soft. Bowel sounds are normal. She exhibits no distension. There is  no tenderness.  Neurological: She is alert.  Skin: No rash noted.  Extremities: 1+ pitting edema, pulses symmetric and intact   Assessment & Plan:   See encounters tab for problem based medical decision making.   Patient seen with Dr. Criselda PeachesMullen

## 2015-11-11 NOTE — Progress Notes (Signed)
Pt arrived to floor.  Telemetry placed and CCMD notified.  Pt oriented to room including call light and telephone.  Pt denies chest pain at this time.  Internal medicine notified of Pt arrival.  Will cont to monitor.

## 2015-11-11 NOTE — H&P (Signed)
Date: 11/11/2015               Patient Name:  Lisa Haynes MRN: [id number]  DOB: 1940-12-17 Age / Sex: 75 y.o., female   PCP: Eulah Pont, MD         Medical Service: Internal Medicine Teaching Service         Attending Physician: Dr. Doneen Poisson, MD    First Contact: Dr. Mikey Bussing Pager: [phone]  Second Contact: Dr. Beckie Salts Pager: [phone]       After Hours (After 5p/  First Contact Pager: [phone]  weekends / holidays): Second Contact Pager: [phone]   Chief Complaint: "When I went to the clinic they told me to come to the ED, but I didn't want to."  History of Present Illness: Lisa Haynes is a 75 year old woman with history of HTN, hypothyroidism, and GERD who was sent to the ED from clinic for tachycardia and chest discomfort.  Yesterday night, she noticed some shortness of breath and a "tinkling" sensation in her left chest.  When she woke up this morning, she felt mostly normal.  This afternoon, she was found to be tachycardic to 150s with documented chest pain at her clinic visit, though she now denies chest pain and expresses that she didn't want to come to the hospital.  She has occasional exertional dyspnea and chest discomfort, but hasn't been concerned about it.  No orthopnea (sleeps on 2 pillows) or PND.  Saw cardiology last Novemember for paroxysmal afib, which was deemed to be an isolated incident in the setting of pancreatitis and not warrant chronic anticoagulation.  Has also had paroxysymal atrial tachycardia.  She also has a history of recurrent cryptogenic falls.  Most recently, at the end of March, she fell in the bathtub with a loss of consciousness.  She can recall no reliable prodromal symptoms, but says she sometimes feels lightheaded and sometimes loses consciousness.  Metoprolol was discontinued after April clinic visit out of concern that it might be contributing to her falls, but patient unsure if she has been taking it or not.  Drinks 2 cups of coffee in the  morning, but no other caffinated beverages.  Recently says she's only been eating one cookie a day for breakfast and no other food in order to lose weight.  She endorses stable anxiety since her mother died 8 years ago. ROS also positive for increased hair loss in last 2 months, ?heat intolerance, dysuria.  Meds: Current Facility-Administered Medications  Medication Dose Route Frequency Provider Last Rate Last Dose  . acetaminophen (TYLENOL) tablet 650 mg  650 mg Oral Q6H PRN Tasrif Ahmed, MD      . aspirin EC tablet 81 mg  81 mg Oral Daily Tasrif Ahmed, MD   81 mg at 11/11/15 2100  . [START ON 11/12/2015] calcium-vitamin D (OSCAL WITH D) 500-200 MG-UNIT per tablet 1 tablet  1 tablet Oral Q breakfast Doneen Poisson, MD      . enoxaparin (LOVENOX) injection 40 mg  40 mg Subcutaneous Q24H Alexa Lucrezia Starch, MD   40 mg at 11/11/15 2001  . lisinopril (PRINIVIL,ZESTRIL) tablet 10 mg  10 mg Oral Daily Tasrif Ahmed, MD      . multivitamin with minerals tablet 1 tablet  1 tablet Oral Daily Tasrif Ahmed, MD      . pantoprazole (PROTONIX) EC tablet 40 mg  40 mg Oral Daily Tasrif Ahmed, MD      . sertraline (ZOLOFT) tablet 25 mg  25 mg Oral QHS Tasrif Ahmed, MD      . simvastatin (ZOCOR) tablet 10 mg  10 mg Oral QHS Tasrif Ahmed, MD      . sodium chloride flush (NS) 0.9 % injection 3 mL  3 mL Intravenous Q12H Alexa R Burns, MD   3 mL at 11/11/15 2001   No current facility-administered medications on file prior to encounter.   Current Outpatient Prescriptions on File Prior to Encounter  Medication Sig Dispense Refill  . acetaminophen (TYLENOL) 325 MG tablet Take 2 tablets (650 mg total) by mouth every 6 (six) hours as needed for pain. 60 tablet 3  . alendronate (FOSAMAX) 70 MG tablet Take 1 tablet (70 mg total) by mouth every 7 (seven) days. Take with a full glass of water on an empty stomach. 4 tablet 11  . aspirin EC 81 MG tablet Take 1 tablet (81 mg total) by mouth daily. 90 tablet 3  . Calcium  Carbonate-Vitamin D (CALCARB 600/D) 600-400 MG-UNIT per tablet Take 1 tablet by mouth 3 (three) times daily with meals. (Patient taking differently: Take 1 tablet by mouth daily. ) 30 tablet 2  . lisinopril (PRINIVIL,ZESTRIL) 10 MG tablet TAKE 1 TABLET BY MOUTH DAILY 90 tablet 3  . metoprolol tartrate (LOPRESSOR) 25 MG tablet TAKE 1 TABLET BY MOUTH TWICE DAILY 60 tablet 0  . Multiple Vitamins-Minerals (MULTIVITAMIN WITH MINERALS) tablet Take 1 tablet by mouth daily.    Marland Kitchen omeprazole (PRILOSEC) 20 MG capsule TAKE 1 CAPSULE(20 MG) BY MOUTH DAILY 30 capsule 3  . sertraline (ZOLOFT) 50 MG tablet TAKE 1/2 TABLET BY MOUTH AT BEDTIME 30 tablet 6  . simvastatin (ZOCOR) 10 MG tablet Take 1 tablet (10 mg total) by mouth at bedtime. 90 tablet 3    Allergies: Allergies as of 11/11/2015  . (No Known Allergies)   Past Medical History  Diagnosis Date  . Bilateral knee pain   . Depression   . Hypertension   . Low back pain   . Osteoarthritis   . Osteoporosis   . Hypothyroidism   . Pneumonia     hx of PNA 2013  . Incontinence   . Paroxysmal atrial fibrillation (HCC) 08/20/2014    Family History: Mother with CAD, HTN.  Father with prostate cancer.  Social History: No alcohol, tobacco, or drugs.  Lives with son and has girlfriend.  Review of Systems: A complete ROS was negative except as per HPI.  Physical Exam: Blood pressure 154/83, pulse 93, temperature 97.6 F (36.4 C), temperature source Oral, resp. rate 18, height 5\' 2"  (1.575 m), weight 212 lb 1.6 oz (96.208 kg), SpO2 98 %.   Physical Exam  Constitutional: She is oriented to person, place, and time.  Obese woman, in no distress, sitting on edge of bed starting to eat dinner  HENT:  Head: Normocephalic and atraumatic.  Mouth/Throat: Oropharynx is clear and moist.  Eyes: Conjunctivae are normal. No scleral icterus.  Neck: Normal range of motion. Neck supple.  Cardiovascular: Normal rate, regular rhythm, normal heart sounds and intact  distal pulses.  Exam reveals no gallop and no friction rub.   No murmur heard. Minimal LE edema  Pulmonary/Chest: Effort normal and breath sounds normal. No respiratory distress. She has no wheezes.  Abdominal: Soft. She exhibits no distension. There is no tenderness.  Musculoskeletal:  Tenderness over left shin (pt reports banging it)  Neurological: She is alert and oriented to person, place, and time. GCS score is 15.  Skin: Skin is warm  and dry. No rash noted. No erythema.  Psychiatric: Mood, memory, affect and judgment normal.    EKG (in clinic): sinus tachycardia, no ST elevations or depressions, no TWI, no Q waves  Echo (06/2014): mild LVH, normal EF, no valvular disease  CBC Latest Ref Rng 11/11/2015 10/02/2014 10/01/2014  WBC 4.0 - 10.5 K/uL 9.4 11.5(H) 11.3(H)  Hemoglobin 12.0 - 15.0 g/dL 16.114.7 11.7(L) 11.5(L)  Hematocrit 36.0 - 46.0 % 46.8(H) 37.2 36.1  Platelets 150 - 400 K/uL 215 235 202   CMP Latest Ref Rng 11/11/2015 10/02/2014 10/01/2014  Glucose 65 - 99 mg/dL 90 096(E103(H) 89  BUN 6 - 20 mg/dL 12 6 10   Creatinine 0.44 - 1.00 mg/dL 4.540.85 0.980.68 1.190.59  Sodium 135 - 145 mmol/L 137 137 136  Potassium 3.5 - 5.1 mmol/L 4.8 4.0 3.3(L)  Chloride 101 - 111 mmol/L 105 105 108  CO2 22 - 32 mmol/L 26 24 22   Calcium 8.9 - 10.3 mg/dL 9.7 8.1(L) 8.0(L)  Total Protein 6.5 - 8.1 g/dL 7.3 1.4(N5.2(L) 5.7(L)  Total Bilirubin 0.3 - 1.2 mg/dL 1.0 0.7 1.2  Alkaline Phos 38 - 126 U/L 73 62 64  AST 15 - 41 U/L 20 21 17   ALT 14 - 54 U/L 11(L) 21 20    Cardiac Panel (last 3 results)  Recent Labs  11/11/15 1928  TROPONINI <0.03    Component     Latest Ref Rng 11/11/2015  TSH     0.350 - 4.500 uIU/mL 4.671 (H)   Component     Latest Ref Rng 11/11/2015  T4,Free(Direct)     0.61 - 1.12 ng/dL 8.290.71     Assessment & Plan by Problem: Principal Problem:   Chest pain Active Problems:   Hypothyroidism   Depression with anxiety   Essential hypertension   GERD   Paroxysmal atrial fibrillation  (HCC)  #Chest Pain ACS vs arrhythmia vs GERD vs anxiety.  No electrolyte abnormalities or anemia.  She is at risk for ACS due to her age, HL and HTN (ASCVD 32.3%).  With her history of atrial tachycardia and ?palpitations, she may have continued to have episodic SVT.  Unclear if she has been taking metoprolol. Considered thyrotoxicosis with TSH slightly elevated, but normal free T4.  No clear associated with food or posture, and vague symptomatology makes GERD less likely.  She describes baseline anxiety, but no anxiety associated with the chest discomfort. -Telemetry -Low threshold to repeat EKG if symptoms evolve or abnormalities on telemetry -Trend troponins -Orthostatics -Consider Holter on discharge -Continue home PPI  #Falls Unknown etiology, patient cannot describe consistent circumstances.  Arrhythmia and orthostasis are possible, but likely also a mechanical component.  Recent echo with no structural heart disease. -PT -B12 level  #HTN -Continue home lisinopril -Hold metoprolol for now  #Anxiety #Osteoporosis -Continue home meds  #Disposition Assess need for help at home -Home Health for medication management -Home PT  Dispo: Admit patient to Observation with expected length of stay less than 2 midnights.  Signed: Alm BustardMatthew O'Sullivan, MD 11/11/2015, 9:22 PM  Pager: [phone]

## 2015-11-11 NOTE — Assessment & Plan Note (Addendum)
Pt has been experiencing chest pain, left arm pain, diaphoresis, light-headedness and palpitations. The chest pain has occured with exertion and when she experienced anxiety attacks for years but now she has started to experience it even at rest. Over the past week her chest pain has been 6/10 and began radiating to her left arm. The most recent episode of chest pain was last night while she was lying in bed. When she experiences the chest pain on exertion it is only sometimes relieved with rest. She has also noticed that her shoes don't fit recently possibly due to increased lower extremity edema. Denies paroxysmal dyspnea but has started using 2 pillows up from her usual 1 pillow. She has had one episode of syncope in the past year.   EKG today showed sinus tachycardia with rate of 150, she will be admitted to the inpatient service for increased rate and chest pain rule out  Ordered a TSH, previous on 09/29/2014 was elevated TSH 4.85 and thyroid level abnormalities could lend some explanation to her arrhythmia  Continue metoprolol for rate control  CHA2DS2 VASc = 4 (HTN, AGE >75,Female) HAS- BLED= 3 (HTN, Age>65, ASA) so if she is truly found to be in Afib when her rate slows she should be evaluated for the need to start anticoagulant such as Eliquis 5mg  (>60kg, age<80, SCr< 1.5)  and d/c ASA

## 2015-11-11 NOTE — Assessment & Plan Note (Addendum)
Pt has been experiencing chest pain, left arm pain, diaphoresis, light-headedness and tachycardia. She states she has been experiencing the chest pain with exertion and when she feels anxious for years but now she has started to experience it even at rest. Over the past week her chest pain has been 6/10 and began radiating to her left arm. The last episode of chest pain was last night while she was lying in bed. When she experiences the chest pain on exertion it is relieved with rest only sometimes. She has also noticed increased lower extremity edema. Denies paroxysmal dyspnea but has started using 2 pillows up from her usual 1 pillow.  She had an episode of syncope 07/2015 where she passed out in the shower without forewarning, denies post-ictal state.  She has a history of afib noted on hospital admission for gallstone pancreatitis 06/2014 she was started on metoprolol and Apixiban but cardiology recommended stopping ASA and Apixiban due to the afib being a one time finding. Pt states she still been taking metoprolol   Pt has a history of paroxysmal Afib, EKG showed NSR tachycardia with rate of 150 will admit for tachycardia and chest pain rule out Tachycardia may be related to her gerneralized anxiety, I would recommend increasing her Sertraline from 12.5mg  to 25mg  when she comes back for post admission follow up visit.

## 2015-11-11 NOTE — Assessment & Plan Note (Addendum)
Pt has been experiencing 1 watery brown bowel movement per day since her cholecystectomy surgery 09/2014. Denies abdominal pain or blood in stool. She has experienced bowel incontinence twice in the last week. Buttermilk improves her symptoms. 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   Will prescribe trial of Cholestyramine 2g qd for possible post cholecystectomy diarrhea when she returns to the clinic post hospitalization Order BMP

## 2015-11-12 ENCOUNTER — Encounter (HOSPITAL_COMMUNITY): Payer: Self-pay | Admitting: Cardiology

## 2015-11-12 ENCOUNTER — Other Ambulatory Visit: Payer: Self-pay

## 2015-11-12 DIAGNOSIS — I480 Paroxysmal atrial fibrillation: Secondary | ICD-10-CM | POA: Diagnosis not present

## 2015-11-12 DIAGNOSIS — I110 Hypertensive heart disease with heart failure: Secondary | ICD-10-CM | POA: Diagnosis not present

## 2015-11-12 DIAGNOSIS — I483 Typical atrial flutter: Secondary | ICD-10-CM

## 2015-11-12 DIAGNOSIS — I471 Supraventricular tachycardia, unspecified: Secondary | ICD-10-CM | POA: Insufficient documentation

## 2015-11-12 DIAGNOSIS — I208 Other forms of angina pectoris: Secondary | ICD-10-CM | POA: Diagnosis not present

## 2015-11-12 DIAGNOSIS — I10 Essential (primary) hypertension: Secondary | ICD-10-CM

## 2015-11-12 DIAGNOSIS — F419 Anxiety disorder, unspecified: Secondary | ICD-10-CM

## 2015-11-12 DIAGNOSIS — F418 Other specified anxiety disorders: Secondary | ICD-10-CM | POA: Diagnosis not present

## 2015-11-12 DIAGNOSIS — R296 Repeated falls: Secondary | ICD-10-CM | POA: Diagnosis not present

## 2015-11-12 DIAGNOSIS — M810 Age-related osteoporosis without current pathological fracture: Secondary | ICD-10-CM | POA: Diagnosis not present

## 2015-11-12 DIAGNOSIS — R079 Chest pain, unspecified: Secondary | ICD-10-CM

## 2015-11-12 DIAGNOSIS — I509 Heart failure, unspecified: Secondary | ICD-10-CM | POA: Diagnosis not present

## 2015-11-12 DIAGNOSIS — K219 Gastro-esophageal reflux disease without esophagitis: Secondary | ICD-10-CM | POA: Diagnosis not present

## 2015-11-12 DIAGNOSIS — I4892 Unspecified atrial flutter: Secondary | ICD-10-CM | POA: Diagnosis not present

## 2015-11-12 LAB — BASIC METABOLIC PANEL
Anion gap: 7 (ref 5–15)
BUN: 13 mg/dL (ref 6–20)
CALCIUM: 9.4 mg/dL (ref 8.9–10.3)
CO2: 25 mmol/L (ref 22–32)
Chloride: 104 mmol/L (ref 101–111)
Creatinine, Ser: 0.79 mg/dL (ref 0.44–1.00)
GFR calc Af Amer: >60 mL/min (ref >60)
GLUCOSE: 97 mg/dL (ref 65–99)
Potassium: 4.1 mmol/L (ref 3.5–5.1)
Sodium: 136 mmol/L (ref 135–145)

## 2015-11-12 LAB — MAGNESIUM: Magnesium: 2 mg/dL (ref 1.7–2.4)

## 2015-11-12 LAB — MRSA PCR SCREENING: MRSA BY PCR: POSITIVE — AB

## 2015-11-12 LAB — CBC
HCT: 41.7 % (ref 36.0–46.0)
Hemoglobin: 12.9 g/dL (ref 12.0–15.0)
MCH: 28.4 pg (ref 26.0–34.0)
MCHC: 30.9 g/dL (ref 30.0–36.0)
MCV: 91.6 fL (ref 78.0–100.0)
Platelets: 189 10*3/uL (ref 150–400)
RBC: 4.55 MIL/uL (ref 3.87–5.11)
RDW: 14.3 % (ref 11.5–15.5)
WBC: 7.7 10*3/uL (ref 4.0–10.5)

## 2015-11-12 LAB — VITAMIN B12: Vitamin B-12: 259 pg/mL (ref 180–914)

## 2015-11-12 LAB — TROPONIN I

## 2015-11-12 NOTE — Care Management Obs Status (Signed)
MEDICARE OBSERVATION STATUS NOTIFICATION   Patient Details  Name: Lisa Haynes MRN: [id number] Date of Birth: 1941-01-05   Medicare Observation Status Notification Given:  Yes    Darrold SpanWebster, Vickie Ponds Hall, RN 11/12/2015, 4:17 PM

## 2015-11-12 NOTE — Progress Notes (Signed)
Internal Medicine Attending  Date: 11/12/2015  Patient name: Lisa Haynes Medical record number: [id number] Date of birth: 1940-05-09 Age: 75 y.o. Gender: female  I saw and evaluated the patient. I reviewed the resident's note by Dr. Beckie Saltsivet and I agree with the resident's findings and plans as documented in her progress note.  Please see my H&P dated 11/12/2015 and attached to Dr. Arvil Chaco'Sullivan's H&P dated 11/11/2015 for specifics of my evaluation, assessment, plan from earlier today.

## 2015-11-12 NOTE — Progress Notes (Signed)
Subjective: Reports she has not had any additional "tinkling" in her chest or chest discomfort. She notes when she has chest discomfort she becomes diaphoretic, SOB, and dizzy. Denies any associated nausea/vomiting.   Objective: Vital signs in last 24 hours: Filed Vitals:   11/11/15 1822 11/11/15 2037 11/12/15 0404 11/12/15 1012  BP: 154/83 165/90 146/60 155/73  Pulse: 93 84 59 76  Temp: 97.6 F (36.4 C) 97.9 F (36.6 C) 97.5 F (36.4 C) 97.7 F (36.5 C)  TempSrc: Oral Oral Oral Oral  Resp: Height:  (1.575 m)     Weight: 212 lb 1.6 oz (96.208 kg)     SpO2: 98% 98% 95% 99%   Physical Exam General: elderly obese woman sitting up, NAD HEENT: [place]/AT, EOMI, sclera anicteric, mucus membranes moist CV: RRR, no m/g/r Pulm: CTA bilaterally, breaths non-labored Abd: BS+, soft, obese, non-tender Ext: warm, no edema Neuro: alert and oriented x 3  Assessment/Plan:  Chest Pain: She describes chest pain located in her left chest that feels like pins and needles. She notes significant associated diaphoresis, SOB, and dizziness. Her symptoms are certainly concerning for angina. She has been ruled out for ACS with no ST or T wave changes on EKG and troponins negative x 3. Her risk factors include HTN and hyperlipidemia. We will evaluate for myocardial ischemia with a Myoview stress test tomorrow. She is agreeable to coronary intervention if indicated.  - Continue Zocor 10 mg daily - Continue Lisinopril 10 mg daily - Continue ASA - Consider adding back beta blocker after Cards eval  Supraventricular Tachycardia: She has a history of paroxysmal AFib in Feb 2016 while hospitalized for gallstone pancreatitis. This was felt to be an isolated incident due to her acute illness. She has no further documented recordings of AFib. She was on Eliquis previously, but has not been on anticoagulation since her gallbladder surgery. She was also noted to have paroxysmal atrial tachycardia that  hospitalization as well. This admission, her EKG is concerning for possible atrial flutter vs sinus tachycardia with HR in the 150s. While on rounds we reviewed her telemetry and believed she had evidence of MAT with different P wave morphologies present, but her HR was in the 60s-70s which would not be consistent with MAT. Subsequent EKG appears to show normal sinus rhythm. We have asked Cardiology to evaluate her as it is important to distinguish whether this was atrial flutter vs sinus tachycardia to determine her need for anticoagulation. Her CHADS-VASc score is at least 30 (age, HTN, female). According to patient and family, she has been taking her home Metoprolol 25 mg BID.  - Will hold beta blocker for now until Cards evaluation - Will determine if she needs AC  Recurrent falls: Most recently fell in the bathtub this March, possibly had syncope? She reported she had lost consciousness. Her Metoprolol was apparently discontinued at an April clinic visit, but patient reports she believes she is still taking this medication. We called her pharmacy and they did confirm that she filled her medication in April. No reported falls since this time. She did report dizziness but I think this could be related to her arrhythmia. - PT eval  HTN: BPs stable in 120s-150s systolic. - Continue home Lisinopril 10 mg daily - Holding home Metoprolol until Cards evaluation  Anxiety: - Continue home Zoloft 25 mg daily  Osteoporosis: - Continue calcium/vitamin D  Diet: Regular, NPO at midnight for stress test DVT Ppx: Lovenox SQ Dispo:  Anticipated discharge in approximately 1-2 day(s).     Su Hoffarly J Eldor Conaway, MD 11/12/2015, 1:16 PM Pager: [phone]

## 2015-11-12 NOTE — Consult Note (Addendum)
Reason for Consult: chest pain and tachycardia   Referring Physician: Dr. Eppie Gibson   PCP:  Ledell Noss, MD  Primary Cardiologist:Dr. Delane Ginger Lisa Haynes is an 75 y.o. female.    Chief Complaint: chest pain   HPI:  Asked to see 75 year old female with recurrent tachycardia at 150 2:1 atrial flutter.  She has a hx. Of 06/2014  EKG demonstrated atrial fibrillation with rapid ventricular response. This promptly resolved and was in setting of gallstone pancreatitis. Subsequent EKGs show normal sinus rhythm. She also had paroxysmal atrial tachycardia in the hospital.  This was an isolated incident in the setting of food poisoning, gallbladder issues.. Has not been on anticoagulation with Eliquis 5 mg 2 times a day since her gallbladder surgery. She is still taking aspirin. Due to only one episode of PAF her Eliquis was not started.   Echo in 2016 with EF 60%  Mild LVH, no regional wall motion abnormalities. LA was at upper limits of normal in size.  Dr. Marlou Porch on [place] felt to hold off on Eliquis unless recurrent episodes.   CHA2DS2-Vasc score 4-5 (age, sex, HTN +/- h/o HF)  Since 06/20/14, she's not had any recorded atrial fibrillation. She also has morbid obesity, hypertension, hyperlipidemia.  Yesterday went to see PCP for intermittent chest pain and SOB.   In OV  Pt had 2:1 flutter with initial EKG calling it SVT.  Heart rate of 150.   Pt denies chest pain at that time nor awareness of rapid HR.  She was noted to be anxious. She was admitted to evaluate further.  She was in SR on arrival with normal rate.  She has no chest pain currently and her Troponin is neg.    She describes her pain Lt ant pain and it comes and goes, does wake her from sleep.  Associated with SOB.  She gets up at night with it and rests in chair and it resolves.  She also had Lt arm pain but this was strong and improved with ibuprofen.    Also of note in March she "passed out" while taking a shower.  Came to in  bathtub and she did have "an egg" on her forehead.  EMS was called and wanted her to come to the hospital but she refused. She notified her MD of this yesterday.  No further episodes.     Leane Call has been ordered for tomorrow.   Past Medical History  Diagnosis Date  . Bilateral knee pain   . Depression   . Hypertension   . Low back pain   . Osteoporosis   . Pneumonia     hx of PNA 2013  . Urinary incontinence   . Paroxysmal atrial fibrillation ([place]) 08/20/2014  . High cholesterol   . GERD (gastroesophageal reflux disease)   . Osteoarthritis     "legs" (11/11/2015)    Past Surgical History  Procedure Laterality Date  . Appendectomy    . Ercp N/A 09/27/2014    Procedure: ENDOSCOPIC RETROGRADE CHOLANGIOPANCREATOGRAPHY (ERCP);  Surgeon: Carol Ada, MD;  Location: [facility] ENDOSCOPY;  Service: Endoscopy;  Laterality: N/A;  . Cholecystectomy N/A 10/01/2014    Procedure: LAPAROSCOPIC CHOLECYSTECTOMY ;  Surgeon: Erroll Luna, MD;  Location: [facility];  Service: General;  Laterality: N/A;  . Vaginal hysterectomy      with unilateral oophorectomy  . Dilation and curettage of uterus      "after I had my boys"  Family History  Problem Relation Age of Onset  . Heart disease Brother 34  . Heart disease Mother     onset 9s   Social History:  reports that she has never smoked. She has never used smokeless tobacco. She reports that she does not drink alcohol or use illicit drugs.  Allergies: No Known Allergies  OUTPATIENT MEDICATIONS: No current facility-administered medications on file prior to encounter.   Current Outpatient Prescriptions on File Prior to Encounter  Medication Sig Dispense Refill  . acetaminophen (TYLENOL) 325 MG tablet Take 2 tablets (650 mg total) by mouth every 6 (six) hours as needed for pain. 60 tablet 3  . alendronate (FOSAMAX) 70 MG tablet Take 1 tablet (70 mg total) by mouth every 7 (seven) days. Take with a full glass of water on an empty stomach. 4  tablet 11  . aspirin EC 81 MG tablet Take 1 tablet (81 mg total) by mouth daily. 90 tablet 3  . Calcium Carbonate-Vitamin D (CALCARB 600/D) 600-400 MG-UNIT per tablet Take 1 tablet by mouth 3 (three) times daily with meals. (Patient taking differently: Take 1 tablet by mouth daily. ) 30 tablet 2  . lisinopril (PRINIVIL,ZESTRIL) 10 MG tablet TAKE 1 TABLET BY MOUTH DAILY 90 tablet 3  . metoprolol tartrate (LOPRESSOR) 25 MG tablet TAKE 1 TABLET BY MOUTH TWICE DAILY 60 tablet 0  . Multiple Vitamins-Minerals (MULTIVITAMIN WITH MINERALS) tablet Take 1 tablet by mouth daily.    Marland Kitchen omeprazole (PRILOSEC) 20 MG capsule TAKE 1 CAPSULE(20 MG) BY MOUTH DAILY 30 capsule 3  . sertraline (ZOLOFT) 50 MG tablet TAKE 1/2 TABLET BY MOUTH AT BEDTIME 30 tablet 6  . simvastatin (ZOCOR) 10 MG tablet Take 1 tablet (10 mg total) by mouth at bedtime. 90 tablet 3   Pt was unsure if she was taking the Metoprolol.   CURRENT MEDICATIONS: Scheduled Meds: . aspirin EC  81 mg Oral Daily  . calcium-vitamin D  1 tablet Oral Q breakfast  . enoxaparin (LOVENOX) injection  40 mg Subcutaneous Q24H  . lisinopril  10 mg Oral Daily  . multivitamin with minerals  1 tablet Oral Daily  . pantoprazole  40 mg Oral Daily  . sertraline  25 mg Oral QHS  . simvastatin  10 mg Oral QHS  . sodium chloride flush  3 mL Intravenous Q12H   Continuous Infusions:  PRN Meds:.acetaminophen   Results for orders placed or performed during the hospital encounter of 11/11/15 (from the past 48 hour(s))  T4, free     Status: None   Collection Time: 11/11/15  4:46 PM  Result Value Ref Range   Free T4 0.71 0.61 - 1.12 ng/dL    Comment: (NOTE) Biotin ingestion may interfere with free T4 tests. If the results are inconsistent with the TSH level, previous test results, or the clinical presentation, then consider biotin interference. If needed, order repeat testing after stopping biotin.   Troponin I (q 6hr x 3)     Status: None   Collection Time:  11/11/15  7:28 PM  Result Value Ref Range   Troponin I <0.03 <0.03 ng/mL  MRSA PCR Screening     Status: Abnormal   Collection Time: 11/11/15 11:23 PM  Result Value Ref Range   MRSA by PCR POSITIVE (A) NEGATIVE    Comment:        The GeneXpert MRSA Assay (FDA approved for NASAL specimens only), is one component of a comprehensive MRSA colonization surveillance program. It is not intended to  diagnose MRSA infection nor to guide or monitor treatment for MRSA infections. RESULT CALLED TO, READ BACK BY AND VERIFIED WITH: M [facility] @0216  11/12/15 MKELLY   Troponin I (q 6hr x 3)     Status: None   Collection Time: 11/11/15 11:50 PM  Result Value Ref Range   Troponin I <0.03 <0.03 ng/mL  Vitamin B12     Status: None   Collection Time: 11/11/15 11:50 PM  Result Value Ref Range   Vitamin B-12 259 180 - 914 pg/mL    Comment: (NOTE) This assay is not validated for testing neonatal or myeloproliferative syndrome specimens for Vitamin B12 levels.   Troponin I (q 6hr x 3)     Status: None   Collection Time: 11/12/15  6:21 AM  Result Value Ref Range   Troponin I <0.03 <0.03 ng/mL  Basic metabolic panel     Status: None   Collection Time: 11/12/15  6:21 AM  Result Value Ref Range   Sodium 136 135 - 145 mmol/L   Potassium 4.1 3.5 - 5.1 mmol/L   Chloride 104 101 - 111 mmol/L   CO2 25 22 - 32 mmol/L   Glucose, Bld 97 65 - 99 mg/dL   BUN 13 6 - 20 mg/dL   Creatinine, Ser 0.79 0.44 - 1.00 mg/dL   Calcium 9.4 8.9 - 10.3 mg/dL   GFR calc non Af Amer >60 >60 mL/min   GFR calc Af Amer >60 >60 mL/min    Comment: (NOTE) The eGFR has been calculated using the CKD EPI equation. This calculation has not been validated in all clinical situations. eGFR's persistently <60 mL/min signify possible Chronic Kidney Disease.    Anion gap 7 5 - 15  CBC     Status: None   Collection Time: 11/12/15  6:21 AM  Result Value Ref Range   WBC 7.7 4.0 - 10.5 K/uL   RBC 4.55 3.87 - 5.11 MIL/uL    Hemoglobin 12.9 12.0 - 15.0 g/dL   HCT 41.7 36.0 - 46.0 %   MCV 91.6 78.0 - 100.0 fL   MCH 28.4 26.0 - 34.0 pg   MCHC 30.9 30.0 - 36.0 g/dL   RDW 14.3 11.5 - 15.5 %   Platelets 189 150 - 400 K/uL   No results found.  ROS: General:no colds or fevers, no weight changes Skin:no rashes or ulcers HEENT:no blurred vision, no congestion CV:see HPI PUL:see HPI WK:GSUP diarrhea no constipation or melena, no indigestion GU:no hematuria, no dysuria MS:no joint pain, no claudication Neuro:one episode of syncope in March none since or before, no lightheadedness Endo:no diabetes, ? thyroid disease on no meds TSH 4.671    Blood pressure 125/52, pulse 69, temperature 98.1 F (36.7 C), temperature source Oral, resp. rate 21, height 5' 2"  (1.575 m), weight 212 lb 1.6 oz (96.208 kg), SpO2 97 %.  Wt Readings from Last 3 Encounters:  11/11/15 212 lb 1.6 oz (96.208 kg)  11/11/15 212 lb 1.6 oz (96.208 kg)  08/05/15 216 lb 1.6 oz (98.022 kg)    PE: General:Pleasant affect, NAD Skin:Warm and dry, brisk capillary refill HEENT:normocephalic, sclera clear, mucus membranes moist Neck:supple, no JVD, no bruits  Heart:S1S2 RRR without murmur, gallup, rub or click Lungs:clear without rales, rhonchi, or wheezes Abd: obese, soft, non tender, + BS, do not palpate liver spleen or masses Ext:no to trace lower ext edema, 2+ post tib pulses, 2+ radial pulses Neuro:alert and oriented X 3, MAE, follows commands, + facial symmetry  Assessment/Plan Principal Problem:   Chest pain- neg MI is for Lexiscan myoview in AM,  previous nuc was neg for ischemia.  She will be NPO after MN  Active Problems:   Hypothyroidism   Depression with anxiety   Essential hypertension   GERD   Falls walks with a walker   Paroxysmal atrial fibrillation ([place]) now 2:1 flutter at rate 150. With elevated CHA2DS2VASc score would need anticoagulation - if falls are not freq.  Also out pt 30 day event monitor to eval burden for  arrhythmia - would wait and see results of Nuc. Before beginning   Paroxysmal supraventricular tachycardia (HCC) Hx of Syncope in March ? In a flutter with RVR and had conversion pause causing syncope.    Dr. Tamala Julian to see.   Cecilie Kicks  Nurse Practitioner Certified [place] Pager [phone] or after 5pm or weekends call [phone] 11/12/2015, 3:13 PM  The patient has been seen in conjunction with Cecilie Kicks, NP. All aspects of care have been considered and discussed. The patient has been personally interviewed, examined, and all clinical data has been reviewed.   All data has been reviewed. Recurrent episodes of falling/syncope are noted. Vague symptoms in the chest were present with tachycardia at 150 bpm. No ischemic EKG changes were noted. Despite the very rapid heart rates, cardiac markers are negative. She had a low risk myocardial perfusion study one year ago.  The patient has atrial flutter documented on the present admitting EKG. There is a prior history of "atrial fibrillation". The CHADS VASC score is greater than 2. We are recommending chronic long-term anticoagulation with either Coumadin or NOAC.  I also am concerned that with her frequent episodes of falling, risk of injury and bleeding is greater than usual. We do need to exclude the possibility that she has tachycardia bradycardia syndrome and that episodes of falling may be related to postconversion pauses/bradycardia. At discharge I would recommend a thirty-day continuous ambulatory monitor.  In absence of positive cardiac markers, a relatively normal myocardial perfusion study within the past year,  and no clinical evidence of inducible ischemia on the EKG during tachycardia associated with atrial flutter, I would not recommend further ischemia evaluation with nuclear scintigraphy.

## 2015-11-12 NOTE — Progress Notes (Signed)
Subjective: Lisa Haynes is a 75 yo F with a hx of HTN, hypothyroidism, and GERD who presented to the ED yesterday for tachycardia and chest discomfort. This morning she states that she is no longer experiencing the "tinkling" sensation in her chest, and denies current chest pain.  She said that a couple times a week she experiences a pressure lke chest pain associated with diaphoresis and nausea when she walks around the house, and it abates when she sits down. She's never taken anything for this, and she does have a family history of CAD and PAD in her father and brother respectively.    Objective: Vital signs in last 24 hours: Filed Vitals:   11/11/15 2037 11/12/15 0404 11/12/15 1012 11/12/15 1320  BP: 165/90 146/60 155/73 125/52  Pulse: 84 59 76 69  Temp: 97.9 F (36.6 C) 97.5 F (36.4 C) 97.7 F (36.5 C) 98.1 F (36.7 C)  TempSrc: Oral Oral Oral Oral  Resp: Height:      Weight:      SpO2: 98% 95% 99% 97%   Weight change:   Intake/Output Summary (Last 24 hours) at 11/12/15 1336 Last data filed at 11/11/15 2030  Gross per 24 hour  Intake    240 ml  Output      0 ml  Net    240 ml   Constitutional: She is oriented to person, place, and time. Laying in bed in NAD HENT: [place]/AT, MMM,   Cardiovascular: Normal rate, regular rhythm, normal heart sounds and intact distal pulses. Exam reveals no gallop and no friction rub.  No murmur heard. Minimal LE edema  Pulmonary/Chest: Effort normal and breath sounds normal. No respiratory distress. She has no wheezes.  Abdominal: Soft. She exhibits no distension. There is no tenderness.  Neurological: She is alert and oriented to person, place, and time.     Lab Results:    Micro Results: Recent Results (from the past 240 hour(s))  MRSA PCR Screening     Status: Abnormal   Collection Time: 11/11/15 11:23 PM  Result Value Ref Range Status   MRSA by PCR POSITIVE (A) NEGATIVE Final    Comment:        The GeneXpert MRSA  Assay (FDA approved for NASAL specimens only), is one component of a comprehensive MRSA colonization surveillance program. It is not intended to diagnose MRSA infection nor to guide or monitor treatment for MRSA infections. RESULT CALLED TO, READ BACK BY AND VERIFIED WITH: Lisa Haynes  11/12/15 MKELLY    Studies/Results: No results found. Medications: I have reviewed the patient's current medications. Scheduled Meds: . aspirin EC  81 mg Oral Daily  . calcium-vitamin D  1 tablet Oral Q breakfast  . enoxaparin (LOVENOX) injection  40 mg Subcutaneous Q24H  . lisinopril  10 mg Oral Daily  . multivitamin with minerals  1 tablet Oral Daily  . pantoprazole  40 mg Oral Daily  . sertraline  25 mg Oral QHS  . simvastatin  10 mg Oral QHS  . sodium chloride flush  3 mL Intravenous Q12H   Continuous Infusions:  PRN Meds:.acetaminophen Assessment/Plan:  Stable Angina:  Patient endorses a pressure-like chest pain several times a week that is worsened with exertion and relieved when she rests. Patient has a FH of CAD and PAD, and her hx of HTN puts her at greater risk of CAD.  Patient does not use nitroglycerin.  She has agreed to undergo a stress test to assess  the extent of her CAD, and she may be interested in a cardiac catheterization if stress test shows reversible ischemia.  -Myoview stress test -continue 25 mg metoprolol and 81mg  aspirin to reduce risk factors for ACS -.3mg  nitroglycerin PRN  Paroxysmal atrial fibrilliation: Patient is no longer experiencing the "tinkling" sensation that brought her to the ED, but her EKG's since admission have been abnormal showing potential atrial flutter.  Past cardiology visits have shown evidence of atrial fibrillation, but they thought this was a one time occurrence 2/2 pancreatitis.  They did not put her on blood thinners.  CHADVASC2 score of 5 which puts her at a moderate-high risk of stroke and anti-coagluation is recommended.  Her ATRIA  bleeding risk score was 3, which puts her at a low risk (0.76% annual risk of hemorrhage) making warfarin a reasonable option.  Patient is under impression that cardiologist did not want her on anti-coagulation. -Aspirin 81mg  -speak to cardiologist and patient about anti-coagulation  Osteoporosis: -alendronate 70mg  daily  HTN -lisinopril 10mg  once daily -metoprolol tartrate 25mg  daily  GERD -omeprazole 20mg  BID  HLD -simvastatin 10mg  daily  Depression -Zoloft 50mg  daily   This is a Psychologist, occupationalMedical Student Note.  The care of the patient was discussed with Dr. Beckie Saltsivet and the assessment and plan formulated with their assistance.  Please see their attached note for official documentation of the daily encounter.     Lisa Haynes, Med Student 11/12/2015, 1:36 PM

## 2015-11-12 NOTE — Evaluation (Signed)
Physical Therapy Evaluation Patient Details Name: Lisa Haynes MRN: [id number] DOB: 1940/07/02 Today's Date: 11/12/2015   History of Present Illness  Pt adm with chest pain and tachycardia. PMH - afib, htn, gerd, pancreatitis, obesity  Clinical Impression  Pt admitted with above diagnosis and presents to PT with functional limitations due to deficits listed below (See PT problem list). Pt needs skilled PT to maximize independence and safety to allow discharge to home with family. Feel pt is close to baseline but recommend HHPT to assess falls at home.     Follow Up Recommendations Home health PT;Supervision - Intermittent    Equipment Recommendations  None recommended by PT    Recommendations for Other Services       Precautions / Restrictions Precautions Precautions: Fall      Mobility  Bed Mobility Overal bed mobility: Modified Independent                Transfers Overall transfer level: Modified independent Equipment used: 4-wheeled walker                Ambulation/Gait Ambulation/Gait assistance: Supervision Ambulation Distance (Feet): 80 Feet Assistive device: 4-wheeled walker Gait Pattern/deviations: Step-through pattern;Decreased stride length;Wide base of support Gait velocity: decr Gait velocity interpretation: Below normal speed for age/gender General Gait Details: Incr lateral trunk movement due to wide base but no loss of balance  Stairs            Wheelchair Mobility    Modified Rankin (Stroke Patients Only)       Balance Overall balance assessment: History of Falls;Needs assistance Sitting-balance support: No upper extremity supported;Feet supported Sitting balance-Leahy Scale: Good     Standing balance support: Bilateral upper extremity supported Standing balance-Leahy Scale: Poor Standing balance comment: support of rollator                             Pertinent Vitals/Pain Pain Assessment: No/denies pain     Home Living Family/patient expects to be discharged to:: Private residence Living Arrangements: Children Available Help at Discharge: Family;Available PRN/intermittently Type of Home: Apartment Home Access: Stairs to enter Entrance Stairs-Rails: None Entrance Stairs-Number of Steps: 3 Home Layout: One level Home Equipment: Walker - 4 wheels;Cane - quad;Shower seat      Prior Function Level of Independence: Independent with assistive device(s)         Comments: Uses rollator. History of falls     Hand Dominance   Dominant Hand: Right    Extremity/Trunk Assessment   Upper Extremity Assessment: Generalized weakness           Lower Extremity Assessment: Generalized weakness         Communication   Communication: No difficulties  Cognition Arousal/Alertness: Awake/alert Behavior During Therapy: WFL for tasks assessed/performed Overall Cognitive Status: Within Functional Limits for tasks assessed                      General Comments      Exercises        Assessment/Plan    PT Assessment Patient needs continued PT services  PT Diagnosis Difficulty walking;Generalized weakness   PT Problem List Decreased strength;Decreased activity tolerance;Decreased balance;Decreased mobility;Obesity  PT Treatment Interventions DME instruction;Gait training;Functional mobility training;Therapeutic activities;Therapeutic exercise;Balance training;Patient/family education   PT Goals (Current goals can be found in the Care Plan section) Acute Rehab PT Goals Patient Stated Goal: return home PT Goal Formulation: With patient Time For Goal  Achievement: 11/19/15 Potential to Achieve Goals: Good    Frequency Min 3X/week   Barriers to discharge        Co-evaluation               End of Session Equipment Utilized During Treatment: Gait belt Activity Tolerance: Patient tolerated treatment well Patient left: in chair;with call bell/phone within  reach Nurse Communication: Mobility status         Time: 1610-9604 PT Time Calculation (min) (ACUTE ONLY): 18 min   Charges:   PT Evaluation $PT Eval Moderate Complexity: 1 Procedure     PT G Codes:        Jillayne Witte 12-02-2015, 3:47 PM [facility] PT [phone]

## 2015-11-13 ENCOUNTER — Other Ambulatory Visit: Payer: Self-pay | Admitting: Internal Medicine

## 2015-11-13 DIAGNOSIS — I4892 Unspecified atrial flutter: Secondary | ICD-10-CM | POA: Diagnosis not present

## 2015-11-13 DIAGNOSIS — I509 Heart failure, unspecified: Secondary | ICD-10-CM | POA: Diagnosis not present

## 2015-11-13 DIAGNOSIS — K219 Gastro-esophageal reflux disease without esophagitis: Secondary | ICD-10-CM | POA: Diagnosis not present

## 2015-11-13 DIAGNOSIS — R079 Chest pain, unspecified: Secondary | ICD-10-CM | POA: Diagnosis not present

## 2015-11-13 DIAGNOSIS — I208 Other forms of angina pectoris: Secondary | ICD-10-CM | POA: Diagnosis not present

## 2015-11-13 DIAGNOSIS — I110 Hypertensive heart disease with heart failure: Secondary | ICD-10-CM | POA: Diagnosis not present

## 2015-11-13 DIAGNOSIS — I471 Supraventricular tachycardia: Secondary | ICD-10-CM | POA: Diagnosis not present

## 2015-11-13 DIAGNOSIS — M810 Age-related osteoporosis without current pathological fracture: Secondary | ICD-10-CM | POA: Diagnosis not present

## 2015-11-13 DIAGNOSIS — F418 Other specified anxiety disorders: Secondary | ICD-10-CM | POA: Diagnosis not present

## 2015-11-13 MED ORDER — METOPROLOL TARTRATE 25 MG PO TABS: 25.0000 mg | ORAL_TABLET | Freq: Two times a day (BID) | ORAL | Status: DC

## 2015-11-13 MED ORDER — APIXABAN 5 MG PO TABS: 5.0000 mg | ORAL_TABLET | Freq: Two times a day (BID) | ORAL | Status: DC

## 2015-11-13 MED ORDER — CHLORHEXIDINE GLUCONATE CLOTH 2 % EX PADS
6.0000 | MEDICATED_PAD | Freq: Every day | CUTANEOUS | Status: DC
  Administered 2015-11-13: 6 via TOPICAL

## 2015-11-13 MED ORDER — NITROGLYCERIN 0.4 MG SL SUBL: 0.4000 mg | SUBLINGUAL_TABLET | SUBLINGUAL | Status: DC | PRN

## 2015-11-13 MED ORDER — MUPIROCIN 2 % EX OINT
1.0000 "application " | TOPICAL_OINTMENT | Freq: Two times a day (BID) | CUTANEOUS | Status: DC
  Administered 2015-11-13: 1 via NASAL
  Filled 2015-11-13: qty 22

## 2015-11-13 NOTE — Discharge Summary (Signed)
Name: Lisa Haynes MRN: [id number] DOB: May 11, 1940 75 y.o. PCP: Eulah Pont, MD  Date of Admission: 11/11/2015  6:02 PM Date of Discharge: 11/13/15 Attending Physician: Dr. Josem Kaufmann  Discharge Diagnosis: Principal Problem Chest Pain Active Problems  Atrial Flutter Recurrent Falls HTN Anxiety Osteoporosis  Discharge Medications:   Medication List    STOP taking these medications        aspirin EC 81 MG tablet      TAKE these medications        acetaminophen 325 MG tablet  Commonly known as:  TYLENOL  Take 2 tablets (650 mg total) by mouth every 6 (six) hours as needed for pain.     alendronate 70 MG tablet  Commonly known as:  FOSAMAX  Take 1 tablet (70 mg total) by mouth every 7 (seven) days. Take with a full glass of water on an empty stomach.     apixaban 5 MG Tabs tablet  Commonly known as:  ELIQUIS  Take 1 tablet (5 mg total) by mouth 2 (two) times daily.     Calcium Carbonate-Vitamin D 600-400 MG-UNIT tablet  Commonly known as:  CALCARB 600/D  Take 1 tablet by mouth 3 (three) times daily with meals.     ibuprofen 200 MG tablet  Commonly known as:  ADVIL,MOTRIN  Take 200 mg by mouth every 6 (six) hours as needed for moderate pain.     lisinopril 10 MG tablet  Commonly known as:  PRINIVIL,ZESTRIL  TAKE 1 TABLET BY MOUTH DAILY     metoprolol tartrate 25 MG tablet  Commonly known as:  LOPRESSOR  TAKE 1 TABLET BY MOUTH TWICE DAILY     multivitamin with minerals tablet  Take 1 tablet by mouth daily.     omeprazole 20 MG capsule  Commonly known as:  PRILOSEC  TAKE 1 CAPSULE(20 MG) BY MOUTH DAILY     sertraline 50 MG tablet  Commonly known as:  ZOLOFT  TAKE 1/2 TABLET BY MOUTH AT BEDTIME     simvastatin 10 MG tablet  Commonly known as:  ZOCOR  Take 10 mg by mouth every other day.      ASK your doctor about these medications        nitroGLYCERIN 0.4 MG SL tablet  Commonly known as:  NITROSTAT  PLACE 1 TABLET UNDER THE TONGUE EVERY 5 MINUTES AS  NEEDED FOR CHEST PAIN  Ask about: Which instructions should I use?        Disposition and follow-up:   Lisa Haynes was discharged from [facility] in Good condition.  At the hospital follow up visit please address:  1.  Stable Angina- Has she experienced any more episodes of chest pain? Atrial Flutter- Did she hear from Cards about Holtor monitor? Discharged on Eliquis 5 mg BID.  Fall risk- Is she getting home health PT?  2.  Labs / imaging needed at time of follow-up: None  3.  Pending labs/ test needing follow-up: None   Follow-up Appointments: Follow-up Information    Follow up with [facility].   Why:  Appointment on July 20th at 10:45am   Contact information:   [address] [place] [address] [phone]      Follow up with [facility].   Why:  HHPT arranged- they will call you to set up home visits   Contact information:   [address] [place] [address] [phone]  Hospital Course by problem list:   Stable Angina: Patient was admitted from clinic after found to have a HR in the 150s and EKG concerning for atrial flutter. Upon further questioning, she had been experiencing SOB and an unusual sensation in her chest that was described as left-sided, "being stuck with pins", occasional pressure, and associated with dizziness and diaphoresis. Her repeat EKG in the hospital showed NSR, no ST or T wave changes. Her troponins were negative x 3. Cardiology was consulted as there was some question if her initial rhythm was atrial flutter vs sinus tachycardia. Additionally there was concern she had MAT when looking at her telemetry on rounds one day. Cardiology agreed that her initial EKG was atrial flutter and that she should be started on [facility]. She was started on Eliquis 5 mg BID. Given her normal troponins and no ST or T wave changes even while in atrial flutter, Cardiology  recommended to hold off on stress testing. She was discharged on Metoprolol 25 mg BID, Zocor 10 mg daily, Lisinopril 10 mg daily, and Eliquis. She will follow up in the [facility].   Atrial Flutter: Her initial rhythm in clinic was concerning for atrial flutter as noted above. She has a remote history of atrial fibrillation, but this was an isolated incident in Feb 2016 when she was hospitalized for gallstone pancreatitis. She had no further documented incidents of AFib and Eliquis was stopped. There was some question whether her initial rhythm on EKG this admission was atrial flutter vs sinus tachycardia. There was also concern that she may not be taking her home Metoprolol. Cardiology was consulted and agreed this was atrial flutter. Cardiology recommended anticoagulation given her CHADS-VASc score of 4 (HTN, age, female). She was discharged on Metoprolol 25 mg BID and Eliquis 5 mg BID.   Discharge Vitals:   BP 136/89 mmHg  Pulse 76  Temp(Src) 98.2 F (36.8 C) (Oral)  Resp 20  Ht 5\' 2"  (1.575 m)  Wt 210 lb 11.2 oz (95.573 kg)  BMI 38.53 kg/m2  SpO2 96% Physical Exam General: elderly obese woman sitting up in chair, NAD, pleasant HEENT: [place]/AT, EOMI, sclera anicteric, mucus membranes moist CV: RRR, no m/g/r Pulm: CTA bilaterally, breaths non-labored Abd: BS+, soft, obese, non-tender Ext: warm, no edema  Neuro: alert and oriented x 3  Pertinent Labs, Studies, and Procedures:  Troponins negative x 3 EKG 7/11: Atrial flutter  Discharge Instructions: Discharge Instructions    AMB Referral to [facility] Care Management    Complete by:  As directed   Reason for consult:  Hospital referral - post hospital monitoring- resources  Expected date of contact:  1-3 days (reserved for hospital discharges)  Please assess for meals on wheels.  Please check eligibility for [employer] program. Patient will have home health with [facility].  She also [facility] from [facility]   Please assign to  community nurse for transition of care calls and assess for home visits. Questions please call:   Charlesetta Shanks, RN BSN CCM [facility]  [phone] business mobile phone Toll free office [phone]     Diet - low sodium heart healthy    Complete by:  As directed      Discharge instructions    Complete by:  As directed   It was a pleasure taking care of you, Ms. Busche. You were hospitalized for atrial flutter.  1. We have started you on Eliquis 5 mg twice daily. This medication is a blood thinner  that will help prevent strokes with your history of atrial fibrillation and now atrial flutter.  2. STOP taking aspirin as you are on Eliquis 3. Make sure you are taking Metoprolol 25 mg twice daily  4. If you have chest pain, please go to the emergency room or clinic for further evaluation. You can take nitroglycerin if you are having chest pain but still need to see a doctor. 5. You have a follow up appointment in the Internal Medicine Clinic on July 20th at 10:45AM  Take care, Dr. Beckie Saltsivet     Increase activity slowly    Complete by:  As directed            Signed: Su Hoffarly J Rivet, MD 11/17/2015, 8:57 AM   Pager: [phone]

## 2015-11-13 NOTE — Assessment & Plan Note (Signed)
She has been experiencing pain with urination, frequency, and urgency. She wakes up multiple times through the night to use the restroom. Denies burning with urination but says "it just hurts".   Ordered spot U/A to rule out UTI

## 2015-11-13 NOTE — Care Management Note (Addendum)
Case Management Note Donn PieriniKristi Ursula Dermody RN, BSN Unit 2W-Case Manager [phone]  Patient Details  Name: Lisa Haynes MRN: [id number] Date of Birth: October 20, 1940  Subjective/Objective:    Pt admitted with chest pain                Action/Plan: PTA pt lived at home with son- per PT recommendations HHPT has been ordered- spoke with pt at bedside- and [facility] [place]- [facility] pt [facility] the [facility] and would like to use them again- [facility]-   Expected Discharge Date:       11/13/15           Expected Discharge Plan:  Home w Home Health Services  In-House Referral:     Discharge planning Services  CM Consult  Post Acute Care Choice:  Home Health Choice offered to:  Patient  DME Arranged:    DME Agency:     HH Arranged:  PT HH Agency:  [facility]  Status of Service:  Completed, signed off  If discussed at Long Length of Stay Meetings, dates discussed:    Additional Comments:  Darrold SpanWebster, Trevian Hayashida Hall, RN 11/13/2015, 12:33 PM

## 2015-11-13 NOTE — Progress Notes (Signed)
No issues at present. IV removed. Reviewed discharge instructions. Awaiting wheelchair for discharge.  Srishti Strnad, Charlaine DaltonAnn Brooke RN

## 2015-11-13 NOTE — Progress Notes (Addendum)
   Subjective: No acute events overnight. She denies any chest pain, SOB, palpitations, dizziness, or sweating. She is anxious to go home.   Objective: Vital signs in last 24 hours: Filed Vitals:   11/12/15 1012 11/12/15 1320 11/12/15 2030 11/13/15 0315  BP: 155/73 125/52 136/69 136/89  Pulse: 76 69 66 76  Temp: 97.7 F (36.5 C) 98.1 F (36.7 C) 98.2 F (36.8 C) 98.2 F (36.8 C)  TempSrc: Oral Oral Oral Oral  Resp:  21 20 20   Height:      Weight:    210 lb 11.2 oz (95.573 kg)  SpO2: 99% 97% 98% 96%   Physical Exam General: elderly obese woman sitting up in chair, NAD, pleasant HEENT: [place]/AT, EOMI, sclera anicteric, mucus membranes moist CV: RRR, no m/g/r Pulm: CTA bilaterally, breaths non-labored Abd: BS+, soft, obese, non-tender Ext: warm, no edema  Neuro: alert and oriented x 3  Assessment/Plan:  Chest Pain: Her chest pain is concerning for angina and this has been ongoing for at least 1 year per the patient. However, Cards believes that she does not require a Myoview at this time as she had a normal Myoview 1 year ago and no elevations in her troponins or ischemic EKG changes this admission despite being in AFlutter. Cards is recommending a 30 day holtor monitor to evaluate for tachy-brady syndrome with her history of recurrent falls and questionable syncope. We will set this up. She was recommended to come to clinic or the ED if she experiences recurrent chest pain.  - Sent message to Cards to set up Holtor monitor as outpatient - Restart Metoprolol 25 mg BID - Continue Lisinopril 10 mg daily - Continue Zocor 10 mg daily - Stop ASA as starting Eliquis - Would have low threshold to get Myoview as outpatient if patient experiences continued chest pain  Atrial Flutter: Cards believes her admission EKG did show atrial flutter and [facility]. Her CHADS-VASc score is at least 4 with her age, sex, and HTN (remote history of CHF?). This places her at a 4.8% risk of  stroke each year. We discussed the risks and benefits of anticoagulation with her and she is agreeable to [facility]. We will start her on Eliquis. - Start Eliquis 5 mg BID  Recurrent Falls: She does have a history of falls, but she states this morning that they were mostly related to washing her feet in the shower. She does have a shower chair and notes she will use this. She denies any dizziness or loss of consciousness as the cause of her falls. I do not think this should prevent her from being on anticoagulation as her CHADS-VASc score is at least 4 which is a 4.8% stroke risk each year.  - PT evaluated>> will order home health PT  HTN: BPs stable in 120s-150s systolic.  - Continue Lisinopril 10 mg daily - Restart Metoprolol 25 mg BID   Anxiety:  - Continue Zoloft 25 mg daily   Osteoporosis:  - Continue calcium/vitamin D - She will resume Fosamax at home   Diet: Regular  DVT PPx: Start Eliquis Dispo: Discharge today    Su Hoffarly J Haidynn Almendarez, MD 11/13/2015, 10:44 AM Pager: [phone]

## 2015-11-13 NOTE — Progress Notes (Signed)
Subjective: [name] is a 75 yo F admitted for irregular heart beat and occasional chest pains two days ago.  Patient has had these intermittent pressure like chest pains 2 times a week for the past year, that are worsened with exertion.  Patient has not experienced any chest pains, nausea/vomiting, diaphoresis, or "tinkling" sensation in her chest since she's been admitted.  She states she is feeling better today and feels that she's ready to go home.   Overnight: No acute events, VSS and wnl.  Patients EKG yesterday did not show any ischemic changes and sinus rhythm. Troponins remain negative. Myoview was not recommended by cardiology, so this order was cancelled.   Objective: Vital signs in last 24 hours: Filed Vitals:   11/12/15 1012 11/12/15 1320 11/12/15 2030 11/13/15 0315  BP: 155/73 125/52 136/69 136/89  Pulse: 76 69 66 76  Temp: 97.7 F (36.5 C) 98.1 F (36.7 C) 98.2 F (36.8 C) 98.2 F (36.8 C)  TempSrc: Oral Oral Oral Oral  Resp:  21 20 20   Height:      Weight:    95.573 kg (210 lb 11.2 oz)  SpO2: 99% 97% 98% 96%   Weight change: -0.635 kg (-1 lb 6.4 oz)  Intake/Output Summary (Last 24 hours) at 11/13/15 1050 Last data filed at 11/12/15 2041  Gross per 24 hour  Intake    120 ml  Output      0 ml  Net    120 ml   Constitutional: She is oriented to person, place, and time. Sitting in chair in NAD HENT: [place]/AT, MMM,   Cardiovascular: Normal rate, regular rhythm, normal heart sounds and intact distal pulses. Exam reveals no gallop and no friction rub.  No murmur heard. Minimal LE edema  Pulmonary/Chest: Effort normal and breath sounds normal. No respiratory distress. She has no wheezes.  Abdominal: Soft. She exhibits no distension. There is no tenderness.  Neurological: She is alert and oriented to person, place, and time.  Lab Results:  Micro Results: Recent Results (from the past 240 hour(s))  MRSA PCR Screening     Status: Abnormal   Collection Time:  11/11/15 11:23 PM  Result Value Ref Range Status   MRSA by PCR POSITIVE (A) NEGATIVE Final    Comment:        The GeneXpert MRSA Assay (FDA approved for NASAL specimens only), is one component of a comprehensive MRSA colonization surveillance program. It is not intended to diagnose MRSA infection nor to guide or monitor treatment for MRSA infections. RESULT CALLED TO, READ BACK BY AND VERIFIED WITH: Deland PrettyM SHECKLETT,RN @0216  11/12/15 MKELLY    Studies/Results: No results found. Medications: I have reviewed the patient's current medications. Scheduled Meds: . aspirin EC  81 mg Oral Daily  . calcium-vitamin D  1 tablet Oral Q breakfast  . Chlorhexidine Gluconate Cloth  6 each Topical Q0600  . enoxaparin (LOVENOX) injection  40 mg Subcutaneous Q24H  . lisinopril  10 mg Oral Daily  . multivitamin with minerals  1 tablet Oral Daily  . mupirocin ointment  1 application Nasal BID  . pantoprazole  40 mg Oral Daily  . sertraline  25 mg Oral QHS  . simvastatin  10 mg Oral QHS  . sodium chloride flush  3 mL Intravenous Q12H   Continuous Infusions:  PRN Meds:.acetaminophen Assessment/Plan:  Stable Angina: Patient endorses a pressure-like chest pain several times a week that is worsened with exertion and relieved when she rests. Patient has a FH of  CAD and PAD, and her hx of HTN puts her at greater risk of CAD. Patient does not use nitroglycerin. Cardiology did not recommend patient undergo stress test because of her negative troponin X3 since admission, lack of ischemic change on EKG even with tachycardia, and negative myoview 1 year ago.  We spoke to patient about having a low threshold for returning to the ED with chest pain and we will follow up in clinic to continue assessing her chest pain. Cardiology recommends Holter monitor to assess tachy-brady syndrome  -clinic follow up 11/20/15 -continue 25 mg metoprolol and lisinopril  daily -.  nitroglycerin PRN -stop ASA since starting  eliquis -simvastatin  daily  Atrial Flutter: Patient is no longer experiencing the "tinkling" sensation that she expressed in clinic, but her EKG's since admission have shown recurrent tachycardia at 150 and 2:1 atrial flutter.  During a past admission on 06/2014 for gallstone pacreatitis, patient had a single episode of atrial fibrillation but this resolved promptly, and this was thought to be an isolated incident so patient was not started on blood thinners.  CHADVASC2 score of 4-5 which puts her at a moderate-high risk of stroke (20-30%) and anti-coagluation is recommended. Her ATRIA bleeding risk score was 3, which puts her at a low risk (0.76% annual risk of hemorrhage) making anticoagulation a good option.  Patient is amenable to re-starting AC, and was counseled on the risks of bleeding, especially with falls.   -Eliquis  BID -home health PT   Falls Risk:  Patient has a hx of falls in her house, which she attributes to standing up too quickly and washing feet in shower.  Patient uses a 4 wheel walker at home, and understands that she needs to be especially careful since we are staring Eliquis. PT has seen patient and recommends home health PT.  -PT consulted and they recommend home health PT   Osteoporosis: -alendronate  daily  HTN -lisinopril  once daily -metoprolol tartrate  daily  GERD -omeprazole  BID  HLD -simvastatin  daily  Depression -Zoloft  daily   This is a Psychologist, occupational Note.  The care of the patient was discussed with Dr. Beckie Salts and the assessment and plan formulated with their assistance.  Please see their attached note for official documentation of the daily encounter.     Jannet Askew, Med Student 11/13/2015, 10:50 AM

## 2015-11-13 NOTE — Consult Note (Signed)
   [facility] CM Inpatient Consult   11/13/2015  Lisa Haynes 02-May-1941 [id number]  Referral received from inpatient RNCM today.  Patient is eligible for [facility]. [facility] home with son and home health with [facility]. Patient per medical record is a 75 year old woman with a history of hypertension, hypothyroidism, morbid obesity, a transient episode of atrial fibrillation during acute pancreatitis in May 2016, and gastroesophageal reflux disease who presents to clinic and was found to have a pulsatile 150.  Patient evaluated for community based chronic disease management services with [facility] as a benefit of patient's [employer] Medicare Insurance. Spoke with patient at bedside and family at bedside to explain [facility] services. Consent form signed and folder given.  Patient will receive post hospital discharge call and will be evaluated for monthly home visits for assessments and disease process education.  Made Inpatient Case Manager aware that [facility] Care [facility] following. Of note, [facility] Care [facility] services does not replace or interfere with any services that are arranged by inpatient case management or social work.  For additional questions or referrals please contact:   as indicated in the electronic medical record.  to the [facility] Care [facility] Assistant to assign for further contact attempts.   For questions,  please contact:   Charlesetta ShanksVictoria Britian Jentz, RN BSN CCM [facility] Lia[facility]  [phone] business mobile phone Toll free office [phone]

## 2015-11-13 NOTE — Progress Notes (Signed)
Patient seen and examined. Case d/w residents in detail. I agree with findings and plan as documented in Dr. Larena Soxivet's note.  Patient feels well today with no acute complaints. Cardio f/u appreciated. Will start a/c with eliquis given history of afib and documented aflutter on admission with an elevated CHADS2Vasc score. Given history of falls she is at higher risk for bleeding and this was explained to patient. Would consider 30 day ambulatory monitor on d/c. C/w current cardiac meds. Would d/c asa as we are starting apixaban. Consider outpatient myoview if recurrent CP. Patient stable for d/c home today. Will f/u PT eval prior to d/c

## 2015-11-14 ENCOUNTER — Other Ambulatory Visit: Payer: Self-pay

## 2015-11-14 ENCOUNTER — Other Ambulatory Visit: Payer: Self-pay | Admitting: Student

## 2015-11-14 DIAGNOSIS — R55 Syncope and collapse: Secondary | ICD-10-CM

## 2015-11-14 NOTE — Progress Notes (Signed)
Internal Medicine Clinic Attending  I saw and evaluated the patient.  I personally confirmed the key portions of the history and exam documented by Dr. Blum and I reviewed pertinent patient test results.  The assessment, diagnosis, and plan were formulated together and I agree with the documentation in the resident's note. 

## 2015-11-14 NOTE — Patient Outreach (Signed)
Referral received November 13, 2015 [facility] Unsuccessful attempt made to contact patient via telephone for community care coordination and transition of care.  Unable to leave HIPPA compliant message as no recording device picked up. Plan: Make another attempt to contact patient via telephone next week

## 2015-11-16 DIAGNOSIS — F419 Anxiety disorder, unspecified: Secondary | ICD-10-CM | POA: Diagnosis not present

## 2015-11-16 DIAGNOSIS — I480 Paroxysmal atrial fibrillation: Secondary | ICD-10-CM | POA: Diagnosis not present

## 2015-11-16 DIAGNOSIS — E039 Hypothyroidism, unspecified: Secondary | ICD-10-CM | POA: Diagnosis not present

## 2015-11-16 DIAGNOSIS — I10 Essential (primary) hypertension: Secondary | ICD-10-CM | POA: Diagnosis not present

## 2015-11-16 DIAGNOSIS — R296 Repeated falls: Secondary | ICD-10-CM | POA: Diagnosis not present

## 2015-11-16 DIAGNOSIS — M150 Primary generalized (osteo)arthritis: Secondary | ICD-10-CM | POA: Diagnosis not present

## 2015-11-16 DIAGNOSIS — M810 Age-related osteoporosis without current pathological fracture: Secondary | ICD-10-CM | POA: Diagnosis not present

## 2015-11-16 DIAGNOSIS — F329 Major depressive disorder, single episode, unspecified: Secondary | ICD-10-CM | POA: Diagnosis not present

## 2015-11-17 ENCOUNTER — Other Ambulatory Visit: Payer: Self-pay

## 2015-11-17 ENCOUNTER — Telehealth: Payer: Self-pay | Admitting: Internal Medicine

## 2015-11-17 NOTE — Patient Outreach (Signed)
Telephone contact for transition of care, assessment of need. Patient identified  Herself by providing date of birth and address.  Purpose of call stated by this RNCM. Brief description of [facility], programs offered.  Patient agreed to assessment, identified needs she would like to work on in he case management care plan. [facility] and [facility] agreed to home visit next week for initial  home visit and further assessment of needs.  Findings during this telephone contact: Patient has self care deficit- [employer] Medicaid Form initiated in home aide services. Patient stated she would accept [employer] Meals, [facility], spoke with Debby BudAndre who took request for meals, states meals would be delivered on approximately July 21.  Home visit scheduled for next week for further assessment.

## 2015-11-17 NOTE — Telephone Encounter (Signed)
Needs toc discharge date 11/13/15 HFU 11/20/15

## 2015-11-19 ENCOUNTER — Other Ambulatory Visit: Payer: Self-pay | Admitting: Student

## 2015-11-19 DIAGNOSIS — I4892 Unspecified atrial flutter: Secondary | ICD-10-CM

## 2015-11-19 DIAGNOSIS — E039 Hypothyroidism, unspecified: Secondary | ICD-10-CM | POA: Diagnosis not present

## 2015-11-19 DIAGNOSIS — I10 Essential (primary) hypertension: Secondary | ICD-10-CM | POA: Diagnosis not present

## 2015-11-19 DIAGNOSIS — F419 Anxiety disorder, unspecified: Secondary | ICD-10-CM | POA: Diagnosis not present

## 2015-11-19 DIAGNOSIS — R55 Syncope and collapse: Secondary | ICD-10-CM

## 2015-11-19 DIAGNOSIS — F329 Major depressive disorder, single episode, unspecified: Secondary | ICD-10-CM | POA: Diagnosis not present

## 2015-11-19 DIAGNOSIS — I480 Paroxysmal atrial fibrillation: Secondary | ICD-10-CM | POA: Diagnosis not present

## 2015-11-19 DIAGNOSIS — M150 Primary generalized (osteo)arthritis: Secondary | ICD-10-CM | POA: Diagnosis not present

## 2015-11-19 DIAGNOSIS — R296 Repeated falls: Secondary | ICD-10-CM | POA: Diagnosis not present

## 2015-11-19 DIAGNOSIS — M810 Age-related osteoporosis without current pathological fracture: Secondary | ICD-10-CM | POA: Diagnosis not present

## 2015-11-20 ENCOUNTER — Ambulatory Visit (HOSPITAL_COMMUNITY)
Admission: RE | Admit: 2015-11-20 | Discharge: 2015-11-20 | Disposition: A | Discharge status: Home / Self Care | Payer: Commercial Managed Care - HMO | Source: Ambulatory Visit | Attending: Internal Medicine | Admitting: Internal Medicine

## 2015-11-20 ENCOUNTER — Encounter: Payer: Self-pay | Admitting: Internal Medicine

## 2015-11-20 ENCOUNTER — Ambulatory Visit (INDEPENDENT_AMBULATORY_CARE_PROVIDER_SITE_OTHER): Payer: Commercial Managed Care - HMO | Admitting: Internal Medicine

## 2015-11-20 ENCOUNTER — Ambulatory Visit (INDEPENDENT_AMBULATORY_CARE_PROVIDER_SITE_OTHER): Payer: Commercial Managed Care - HMO

## 2015-11-20 VITALS — BP 109/49 | HR 52 | Temp 97.8°F | Wt 209.2 lb

## 2015-11-20 DIAGNOSIS — I4892 Unspecified atrial flutter: Secondary | ICD-10-CM

## 2015-11-20 DIAGNOSIS — R55 Syncope and collapse: Secondary | ICD-10-CM

## 2015-11-20 DIAGNOSIS — R5381 Other malaise: Secondary | ICD-10-CM

## 2015-11-20 DIAGNOSIS — I480 Paroxysmal atrial fibrillation: Secondary | ICD-10-CM

## 2015-11-20 DIAGNOSIS — K219 Gastro-esophageal reflux disease without esophagitis: Secondary | ICD-10-CM | POA: Diagnosis not present

## 2015-11-20 MED ORDER — SIMVASTATIN 10 MG PO TABS: ORAL_TABLET | ORAL | Status: DC

## 2015-11-20 MED ORDER — OMEPRAZOLE 20 MG PO CPDR: DELAYED_RELEASE_CAPSULE | ORAL | Status: DC

## 2015-11-20 NOTE — Assessment & Plan Note (Addendum)
Pt is here for hospital follow up after having stable angina symptoms. She was admitted after having found atrial flutter on the EKG and HR in the 150s from the clinic. Repeat EKG in hospital was NSR. No ST changes. Troponins negative times 3 . Cardiology was consulted and started her on eliquis 5 mg BID due to presence of atrial flutter. She had a remote history of atrial fibrillation last year which resolved spontaneously. She was discharged on metoprolol 25 mg BID, and eliquis,  though she has been taking metoprolol for a while now.  Cardiology recommended doing a 30 day event monitor to rule out tachycardia bradycardia syndrome as they think her episodes of falling may be related to bradycardia/ postconversion pauses.  She denies any chest pain or dyspnea after being discharged from the hospital. She did take her nitroglycerin 4 times yesterday even though she did not have chest pain at that time.  She is compliant with her eliquis.  Her repeat blood pressure was 109/49, but she was persistently bradycardic with HR of 50. We did an EKG that confirmed sinus bradycardia.. Given that she is on eliquis, she is at high risk for bleeding if she falls due to the bradycardia, so we stopped the metoprolol today  Plan Stop metoprolol Follow up with cardiology today Continue Eliquis, and lisinopril

## 2015-11-20 NOTE — Progress Notes (Signed)
Patient ID: Lisa Haynes, female   DOB: 1940-11-07, 75 y.o.   MRN: [id number]    CC: hospital follow up for stable angina HPI: LisaLemya Ellin SabaJ Haynes is a 75 y.o. woman with PMH noted below here for hospital follow up for her stable angina. She has a cardiology appointment at 2 PM today   Please see Problem List/A&P for the status of the patient's chronic medical problems   Past Medical History  Diagnosis Date  . Bilateral knee pain   . Depression   . Hypertension   . Low back pain   . Osteoporosis   . Pneumonia     hx of PNA 2013  . Urinary incontinence   . Paroxysmal atrial fibrillation (HCC) 08/20/2014  . High cholesterol   . GERD (gastroesophageal reflux disease)   . Osteoarthritis     "legs" (11/11/2015)    Review of Systems:  Negative except as per HPI/problem list  Physical Exam: Filed Vitals:   11/20/15 1011 11/20/15 1102  BP: 155/59 109/49  Pulse: 52 52  Temp: 97.8 F (36.6 C)   TempSrc: Oral   Weight: 209 lb 3.2 oz (94.892 kg)   SpO2: 100%     General: A&O, in NAD, obese CV: bradycardic to rate 50s, normal s1, split s2, no m/r/g,  Resp: equal and symmetric breath sounds, no wheezing or crackles  Abdomen: soft, nontender, nondistended, +BS Extremities: pulses intact b/l, trace pitting edema to knees bilaterally, prominent varicose veins present    Assessment & Plan:   See encounters tab for problem based medical decision making. Patient discussed with Dr. Oswaldo DoneVincent

## 2015-11-20 NOTE — Patient Instructions (Signed)
Thank you for your visit today Please stop the metoprolol . Please continue the Eliquis Please follow up with cardiology today for your event monitor Please follow up in 1 month here

## 2015-11-20 NOTE — Assessment & Plan Note (Signed)
She is getting her home physical therapy.

## 2015-11-21 DIAGNOSIS — I10 Essential (primary) hypertension: Secondary | ICD-10-CM | POA: Diagnosis not present

## 2015-11-21 DIAGNOSIS — F419 Anxiety disorder, unspecified: Secondary | ICD-10-CM | POA: Diagnosis not present

## 2015-11-21 DIAGNOSIS — R296 Repeated falls: Secondary | ICD-10-CM | POA: Diagnosis not present

## 2015-11-21 DIAGNOSIS — M150 Primary generalized (osteo)arthritis: Secondary | ICD-10-CM | POA: Diagnosis not present

## 2015-11-21 DIAGNOSIS — E039 Hypothyroidism, unspecified: Secondary | ICD-10-CM | POA: Diagnosis not present

## 2015-11-21 DIAGNOSIS — I480 Paroxysmal atrial fibrillation: Secondary | ICD-10-CM | POA: Diagnosis not present

## 2015-11-21 DIAGNOSIS — M810 Age-related osteoporosis without current pathological fracture: Secondary | ICD-10-CM | POA: Diagnosis not present

## 2015-11-21 DIAGNOSIS — F329 Major depressive disorder, single episode, unspecified: Secondary | ICD-10-CM | POA: Diagnosis not present

## 2015-11-21 NOTE — Telephone Encounter (Signed)
Patient came to follow up appointment where all concerns were addressed. [facility].

## 2015-11-21 NOTE — Progress Notes (Signed)
Internal Medicine Clinic Attending  Case discussed with Dr. Johnny BridgeSaraiya at the time of the visit.  We reviewed the resident's history and exam and pertinent patient test results.  I agree with the assessment, diagnosis, and plan of care documented in the resident's note.  I personally reviewed the ECG which is sinus bradycardia with rate of 52 bpm, no heart block. 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 Given the risk of brady with recent falls/syncope, we stopped metoprolol and she will follow up with cardiology to discuss risks/benefits of pacer intervention.

## 2015-11-25 NOTE — Progress Notes (Signed)
PT Note Late G Code Entry    11/12/15 1550  PT G-Codes **NOT FOR INPATIENT CLASS**  Functional Assessment Tool Used clinical judgement  Functional Limitation Mobility: Walking and moving around  Mobility: Walking and Moving Around Current Status [phone]) CI  Mobility: Walking and Moving Around Goal Status [phone]) [facility] PT [phone]

## 2015-11-26 ENCOUNTER — Other Ambulatory Visit: Payer: Self-pay

## 2015-11-26 DIAGNOSIS — R296 Repeated falls: Secondary | ICD-10-CM | POA: Diagnosis not present

## 2015-11-26 DIAGNOSIS — E039 Hypothyroidism, unspecified: Secondary | ICD-10-CM | POA: Diagnosis not present

## 2015-11-26 DIAGNOSIS — M150 Primary generalized (osteo)arthritis: Secondary | ICD-10-CM | POA: Diagnosis not present

## 2015-11-26 DIAGNOSIS — F329 Major depressive disorder, single episode, unspecified: Secondary | ICD-10-CM | POA: Diagnosis not present

## 2015-11-26 DIAGNOSIS — M810 Age-related osteoporosis without current pathological fracture: Secondary | ICD-10-CM | POA: Diagnosis not present

## 2015-11-26 DIAGNOSIS — I10 Essential (primary) hypertension: Secondary | ICD-10-CM | POA: Diagnosis not present

## 2015-11-26 DIAGNOSIS — F419 Anxiety disorder, unspecified: Secondary | ICD-10-CM | POA: Diagnosis not present

## 2015-11-26 DIAGNOSIS — I480 Paroxysmal atrial fibrillation: Secondary | ICD-10-CM | POA: Diagnosis not present

## 2015-11-26 NOTE — Patient Outreach (Signed)
[facility]) Care Management  11/26/2015  Lisa Haynes August 05, 1940 [id number]    Message received from [facility] Administrative Assistant advising this RNCM patient had called to cancel today's scheduled appointment.  Call made to patient's home [phone]) to reschedule, however, there was no answer at number. No voice mail recording pickup up so message could be left.  Plan: Attempt telephone contact later this month.

## 2015-11-28 ENCOUNTER — Telehealth: Payer: Self-pay | Admitting: Internal Medicine

## 2015-11-28 ENCOUNTER — Other Ambulatory Visit: Payer: Self-pay

## 2015-11-28 DIAGNOSIS — I10 Essential (primary) hypertension: Secondary | ICD-10-CM | POA: Diagnosis not present

## 2015-11-28 DIAGNOSIS — M810 Age-related osteoporosis without current pathological fracture: Secondary | ICD-10-CM | POA: Diagnosis not present

## 2015-11-28 DIAGNOSIS — E039 Hypothyroidism, unspecified: Secondary | ICD-10-CM | POA: Diagnosis not present

## 2015-11-28 DIAGNOSIS — M150 Primary generalized (osteo)arthritis: Secondary | ICD-10-CM | POA: Diagnosis not present

## 2015-11-28 DIAGNOSIS — R296 Repeated falls: Secondary | ICD-10-CM | POA: Diagnosis not present

## 2015-11-28 DIAGNOSIS — I480 Paroxysmal atrial fibrillation: Secondary | ICD-10-CM | POA: Diagnosis not present

## 2015-11-28 DIAGNOSIS — F329 Major depressive disorder, single episode, unspecified: Secondary | ICD-10-CM | POA: Diagnosis not present

## 2015-11-28 DIAGNOSIS — F419 Anxiety disorder, unspecified: Secondary | ICD-10-CM | POA: Diagnosis not present

## 2015-11-28 NOTE — Telephone Encounter (Signed)
Cardiology [place]  I received a call from the cardiac monitoring service [employer] that the pt was having sustained episodes of Afib with rates up to the 200's.  This was in the setting of Metoprolol 25 mg BID being discontinued by her internist 11/20/15.  After hours of attempting to call both of her sons Dannielle Huh & Harvie Heck as well as her niece Sheela Stack, I received a call back from [place].  She said that the pt was asymptomatic.  [employer] reported that her HR had improved to the 130's.  I advised that she restart Metoprolol at 12.5 mg PO BID.  Sheela Stack understood & was able to repeat this back to me.  I advised that if she started to feel poorly or if her HR did not improve, they should call back or see urgent medical attention.    Lance Morin, MD

## 2015-11-28 NOTE — Patient Outreach (Signed)
Unsuccessful attempt made to contact patient via telephone to reschedule home visit.   Will make another attempt to contact patient in next week.

## 2015-12-01 ENCOUNTER — Other Ambulatory Visit: Payer: Self-pay

## 2015-12-01 ENCOUNTER — Telehealth: Payer: Self-pay | Admitting: Cardiology

## 2015-12-01 ENCOUNTER — Telehealth: Payer: Self-pay | Admitting: *Deleted

## 2015-12-01 DIAGNOSIS — F329 Major depressive disorder, single episode, unspecified: Secondary | ICD-10-CM | POA: Diagnosis not present

## 2015-12-01 DIAGNOSIS — I480 Paroxysmal atrial fibrillation: Secondary | ICD-10-CM | POA: Diagnosis not present

## 2015-12-01 DIAGNOSIS — I10 Essential (primary) hypertension: Secondary | ICD-10-CM | POA: Diagnosis not present

## 2015-12-01 DIAGNOSIS — R296 Repeated falls: Secondary | ICD-10-CM | POA: Diagnosis not present

## 2015-12-01 DIAGNOSIS — M810 Age-related osteoporosis without current pathological fracture: Secondary | ICD-10-CM | POA: Diagnosis not present

## 2015-12-01 DIAGNOSIS — E039 Hypothyroidism, unspecified: Secondary | ICD-10-CM | POA: Diagnosis not present

## 2015-12-01 DIAGNOSIS — M150 Primary generalized (osteo)arthritis: Secondary | ICD-10-CM | POA: Diagnosis not present

## 2015-12-01 DIAGNOSIS — F419 Anxiety disorder, unspecified: Secondary | ICD-10-CM | POA: Diagnosis not present

## 2015-12-01 NOTE — Telephone Encounter (Signed)
I received monitor tracings 11/28/15 06:52 PM CT, 11/28/15 06:58Pm CT, 11/30/15 01:12PM CT. Tracings have been reviewed by Dr Graciela Husbands fibrillation RVR, rates up to 200.   I spoke with pt's niece, Sheela Stack, with pt's verbal permission. Pt's niece states pt asymptomatic at this time, she does not know heart rate or BP. Pt's niece states pt did restart metoprolol 12.5 mg daily 11/28/15, she did not understand it should have been 12.5mg  bid.  Pt's niece states pt is taking Eliquis 5mg  bid and has not missed any doses.   Per Dr  Fu --increase metoprolol to 25mg  bid, arrange follow up appt in 2-4 weeks with Rudi Coco, NP in Atrial Fibrillation Clinic.  Pt's niece, Sheela Stack aware pt should increase metoprolol to 25mg  bid, appt with Candace Cruise, NP has been scheduled for Tuesday December 23, 2015 at 1:30PM.

## 2015-12-01 NOTE — Patient Outreach (Signed)
   Unsuccessful attempt made to contact patient at [phone] or at her niece's number, Sheela Stack at [phone].  Will make another attempt to contact patient later this week.

## 2015-12-02 ENCOUNTER — Other Ambulatory Visit: Payer: Self-pay

## 2015-12-02 DIAGNOSIS — M150 Primary generalized (osteo)arthritis: Secondary | ICD-10-CM | POA: Diagnosis not present

## 2015-12-02 DIAGNOSIS — R296 Repeated falls: Secondary | ICD-10-CM | POA: Diagnosis not present

## 2015-12-02 DIAGNOSIS — M810 Age-related osteoporosis without current pathological fracture: Secondary | ICD-10-CM | POA: Diagnosis not present

## 2015-12-02 DIAGNOSIS — F329 Major depressive disorder, single episode, unspecified: Secondary | ICD-10-CM | POA: Diagnosis not present

## 2015-12-02 DIAGNOSIS — I10 Essential (primary) hypertension: Secondary | ICD-10-CM | POA: Diagnosis not present

## 2015-12-02 DIAGNOSIS — E039 Hypothyroidism, unspecified: Secondary | ICD-10-CM | POA: Diagnosis not present

## 2015-12-02 DIAGNOSIS — I480 Paroxysmal atrial fibrillation: Secondary | ICD-10-CM | POA: Diagnosis not present

## 2015-12-02 DIAGNOSIS — F419 Anxiety disorder, unspecified: Secondary | ICD-10-CM | POA: Diagnosis not present

## 2015-12-02 NOTE — Patient Outreach (Addendum)
   Unsuccessful attempt made to contact patient at her home number and her emergency contact, her niece. Was able to leave a HIPPA compliant message at niece's number with this RNCM's contact information.     This RNCM attempt to contact member again after she and her niece showed up at [facility] office, however, attempt was unsuccessful   Will make another attempt to contact patient next week.

## 2015-12-03 DIAGNOSIS — M810 Age-related osteoporosis without current pathological fracture: Secondary | ICD-10-CM | POA: Diagnosis not present

## 2015-12-03 DIAGNOSIS — M150 Primary generalized (osteo)arthritis: Secondary | ICD-10-CM | POA: Diagnosis not present

## 2015-12-03 DIAGNOSIS — E039 Hypothyroidism, unspecified: Secondary | ICD-10-CM | POA: Diagnosis not present

## 2015-12-03 DIAGNOSIS — I10 Essential (primary) hypertension: Secondary | ICD-10-CM | POA: Diagnosis not present

## 2015-12-03 DIAGNOSIS — R296 Repeated falls: Secondary | ICD-10-CM | POA: Diagnosis not present

## 2015-12-03 DIAGNOSIS — I480 Paroxysmal atrial fibrillation: Secondary | ICD-10-CM | POA: Diagnosis not present

## 2015-12-03 DIAGNOSIS — F419 Anxiety disorder, unspecified: Secondary | ICD-10-CM | POA: Diagnosis not present

## 2015-12-03 DIAGNOSIS — F329 Major depressive disorder, single episode, unspecified: Secondary | ICD-10-CM | POA: Diagnosis not present

## 2015-12-04 NOTE — Telephone Encounter (Signed)
error 

## 2015-12-09 DIAGNOSIS — M150 Primary generalized (osteo)arthritis: Secondary | ICD-10-CM | POA: Diagnosis not present

## 2015-12-09 DIAGNOSIS — R296 Repeated falls: Secondary | ICD-10-CM | POA: Diagnosis not present

## 2015-12-09 DIAGNOSIS — E039 Hypothyroidism, unspecified: Secondary | ICD-10-CM | POA: Diagnosis not present

## 2015-12-09 DIAGNOSIS — F419 Anxiety disorder, unspecified: Secondary | ICD-10-CM | POA: Diagnosis not present

## 2015-12-09 DIAGNOSIS — F329 Major depressive disorder, single episode, unspecified: Secondary | ICD-10-CM | POA: Diagnosis not present

## 2015-12-09 DIAGNOSIS — I10 Essential (primary) hypertension: Secondary | ICD-10-CM | POA: Diagnosis not present

## 2015-12-09 DIAGNOSIS — M810 Age-related osteoporosis without current pathological fracture: Secondary | ICD-10-CM | POA: Diagnosis not present

## 2015-12-09 DIAGNOSIS — I480 Paroxysmal atrial fibrillation: Secondary | ICD-10-CM | POA: Diagnosis not present

## 2015-12-10 DIAGNOSIS — E039 Hypothyroidism, unspecified: Secondary | ICD-10-CM | POA: Diagnosis not present

## 2015-12-10 DIAGNOSIS — F419 Anxiety disorder, unspecified: Secondary | ICD-10-CM | POA: Diagnosis not present

## 2015-12-10 DIAGNOSIS — M150 Primary generalized (osteo)arthritis: Secondary | ICD-10-CM | POA: Diagnosis not present

## 2015-12-10 DIAGNOSIS — I10 Essential (primary) hypertension: Secondary | ICD-10-CM | POA: Diagnosis not present

## 2015-12-10 DIAGNOSIS — I480 Paroxysmal atrial fibrillation: Secondary | ICD-10-CM | POA: Diagnosis not present

## 2015-12-10 DIAGNOSIS — F329 Major depressive disorder, single episode, unspecified: Secondary | ICD-10-CM | POA: Diagnosis not present

## 2015-12-10 DIAGNOSIS — M810 Age-related osteoporosis without current pathological fracture: Secondary | ICD-10-CM | POA: Diagnosis not present

## 2015-12-10 DIAGNOSIS — R296 Repeated falls: Secondary | ICD-10-CM | POA: Diagnosis not present

## 2015-12-11 ENCOUNTER — Ambulatory Visit: Payer: Self-pay

## 2015-12-11 DIAGNOSIS — E039 Hypothyroidism, unspecified: Secondary | ICD-10-CM | POA: Diagnosis not present

## 2015-12-11 DIAGNOSIS — F329 Major depressive disorder, single episode, unspecified: Secondary | ICD-10-CM | POA: Diagnosis not present

## 2015-12-11 DIAGNOSIS — I10 Essential (primary) hypertension: Secondary | ICD-10-CM | POA: Diagnosis not present

## 2015-12-11 DIAGNOSIS — R296 Repeated falls: Secondary | ICD-10-CM | POA: Diagnosis not present

## 2015-12-11 DIAGNOSIS — F419 Anxiety disorder, unspecified: Secondary | ICD-10-CM | POA: Diagnosis not present

## 2015-12-11 DIAGNOSIS — M150 Primary generalized (osteo)arthritis: Secondary | ICD-10-CM | POA: Diagnosis not present

## 2015-12-11 DIAGNOSIS — M810 Age-related osteoporosis without current pathological fracture: Secondary | ICD-10-CM | POA: Diagnosis not present

## 2015-12-11 DIAGNOSIS — I480 Paroxysmal atrial fibrillation: Secondary | ICD-10-CM | POA: Diagnosis not present

## 2015-12-16 DIAGNOSIS — I10 Essential (primary) hypertension: Secondary | ICD-10-CM | POA: Diagnosis not present

## 2015-12-16 DIAGNOSIS — M810 Age-related osteoporosis without current pathological fracture: Secondary | ICD-10-CM | POA: Diagnosis not present

## 2015-12-16 DIAGNOSIS — M150 Primary generalized (osteo)arthritis: Secondary | ICD-10-CM | POA: Diagnosis not present

## 2015-12-16 DIAGNOSIS — F419 Anxiety disorder, unspecified: Secondary | ICD-10-CM | POA: Diagnosis not present

## 2015-12-16 DIAGNOSIS — E039 Hypothyroidism, unspecified: Secondary | ICD-10-CM | POA: Diagnosis not present

## 2015-12-16 DIAGNOSIS — R296 Repeated falls: Secondary | ICD-10-CM | POA: Diagnosis not present

## 2015-12-16 DIAGNOSIS — F329 Major depressive disorder, single episode, unspecified: Secondary | ICD-10-CM | POA: Diagnosis not present

## 2015-12-16 DIAGNOSIS — I480 Paroxysmal atrial fibrillation: Secondary | ICD-10-CM | POA: Diagnosis not present

## 2015-12-18 DIAGNOSIS — I480 Paroxysmal atrial fibrillation: Secondary | ICD-10-CM | POA: Diagnosis not present

## 2015-12-18 DIAGNOSIS — R296 Repeated falls: Secondary | ICD-10-CM | POA: Diagnosis not present

## 2015-12-18 DIAGNOSIS — E039 Hypothyroidism, unspecified: Secondary | ICD-10-CM | POA: Diagnosis not present

## 2015-12-18 DIAGNOSIS — F329 Major depressive disorder, single episode, unspecified: Secondary | ICD-10-CM | POA: Diagnosis not present

## 2015-12-18 DIAGNOSIS — I10 Essential (primary) hypertension: Secondary | ICD-10-CM | POA: Diagnosis not present

## 2015-12-18 DIAGNOSIS — M810 Age-related osteoporosis without current pathological fracture: Secondary | ICD-10-CM | POA: Diagnosis not present

## 2015-12-18 DIAGNOSIS — M150 Primary generalized (osteo)arthritis: Secondary | ICD-10-CM | POA: Diagnosis not present

## 2015-12-18 DIAGNOSIS — F419 Anxiety disorder, unspecified: Secondary | ICD-10-CM | POA: Diagnosis not present

## 2015-12-23 ENCOUNTER — Inpatient Hospital Stay (HOSPITAL_COMMUNITY)
Admission: RE | Admit: 2015-12-23 | Payer: Commercial Managed Care - HMO | Source: Ambulatory Visit | Admitting: Nurse Practitioner

## 2015-12-23 ENCOUNTER — Ambulatory Visit: Payer: Self-pay

## 2015-12-23 DIAGNOSIS — I10 Essential (primary) hypertension: Secondary | ICD-10-CM | POA: Diagnosis not present

## 2015-12-23 DIAGNOSIS — E039 Hypothyroidism, unspecified: Secondary | ICD-10-CM | POA: Diagnosis not present

## 2015-12-23 DIAGNOSIS — M150 Primary generalized (osteo)arthritis: Secondary | ICD-10-CM | POA: Diagnosis not present

## 2015-12-23 DIAGNOSIS — I480 Paroxysmal atrial fibrillation: Secondary | ICD-10-CM | POA: Diagnosis not present

## 2015-12-23 DIAGNOSIS — F419 Anxiety disorder, unspecified: Secondary | ICD-10-CM | POA: Diagnosis not present

## 2015-12-23 DIAGNOSIS — M810 Age-related osteoporosis without current pathological fracture: Secondary | ICD-10-CM | POA: Diagnosis not present

## 2015-12-23 DIAGNOSIS — R296 Repeated falls: Secondary | ICD-10-CM | POA: Diagnosis not present

## 2015-12-23 DIAGNOSIS — F329 Major depressive disorder, single episode, unspecified: Secondary | ICD-10-CM | POA: Diagnosis not present

## 2015-12-23 NOTE — Patient Outreach (Addendum)
[facility]) Care Management  12/23/2015  Lucilla Edinatsy J Touchton 1940/07/05 [id number]     This RNCM was successful in making contact with patient who requested [facility]. RNCM and patient agreed to home visit next week for the initial home visit and assessment.

## 2015-12-24 ENCOUNTER — Other Ambulatory Visit: Payer: Self-pay

## 2015-12-24 DIAGNOSIS — I10 Essential (primary) hypertension: Secondary | ICD-10-CM | POA: Diagnosis not present

## 2015-12-24 DIAGNOSIS — I480 Paroxysmal atrial fibrillation: Secondary | ICD-10-CM | POA: Diagnosis not present

## 2015-12-24 DIAGNOSIS — M150 Primary generalized (osteo)arthritis: Secondary | ICD-10-CM | POA: Diagnosis not present

## 2015-12-24 DIAGNOSIS — M810 Age-related osteoporosis without current pathological fracture: Secondary | ICD-10-CM | POA: Diagnosis not present

## 2015-12-24 DIAGNOSIS — R296 Repeated falls: Secondary | ICD-10-CM | POA: Diagnosis not present

## 2015-12-24 DIAGNOSIS — F329 Major depressive disorder, single episode, unspecified: Secondary | ICD-10-CM | POA: Diagnosis not present

## 2015-12-24 DIAGNOSIS — E039 Hypothyroidism, unspecified: Secondary | ICD-10-CM | POA: Diagnosis not present

## 2015-12-24 DIAGNOSIS — F419 Anxiety disorder, unspecified: Secondary | ICD-10-CM | POA: Diagnosis not present

## 2015-12-29 NOTE — Addendum Note (Signed)
Addended by: Lavone OrnFAULKNER, Sachit Gilman G on: 12/29/2015 07:51 PM   Modules accepted: Kipp BroodSmartSet

## 2015-12-29 NOTE — Telephone Encounter (Signed)
This encounter was created in error - please disregard.

## 2015-12-30 ENCOUNTER — Other Ambulatory Visit: Payer: Self-pay

## 2015-12-30 ENCOUNTER — Telehealth: Payer: Self-pay

## 2015-12-30 NOTE — Telephone Encounter (Signed)
Darl PikesSusan from [facility] requesting VO. Please call back.

## 2015-12-30 NOTE — Patient Outreach (Signed)
[facility]) Care Management   12/30/2015  Lisa Haynes 01-19-1941 [id number]  Lisa Haynes is an 75 y.o. female  Subjective:  I am doing ok, I just sit here and watch TV Objective:   " ROS  Physical Exam  ROS   Encounter Medications:   Outpatient Encounter Prescriptions as of 12/30/2015  Medication Sig  . acetaminophen (TYLENOL) 325 MG tablet Take 2 tablets (650 mg total) by mouth every 6 (six) hours as needed for pain.  Marland Kitchen alendronate (FOSAMAX) 70 MG tablet Take 1 tablet (70 mg total) by mouth every 7 (seven) days. Take with a full glass of water on an empty stomach.  Marland Kitchen apixaban (ELIQUIS) 5 MG TABS tablet Take 1 tablet (5 mg total) by mouth 2 (two) times daily.  . Calcium Carbonate-Vitamin D (CALCARB 600/D) 600-400 MG-UNIT per tablet Take 1 tablet by mouth 3 (three) times daily with meals. (Patient taking differently: Take 1 tablet by mouth daily. )  . ibuprofen (ADVIL,MOTRIN) 200 MG tablet Take 200 mg by mouth every 6 (six) hours as needed for moderate pain. Reported on 11/20/2015  . lisinopril (PRINIVIL,ZESTRIL) 10 MG tablet TAKE 1 TABLET BY MOUTH DAILY  . metoprolol tartrate (LOPRESSOR) 25 MG tablet Take 1 tablet (25 mg total) by mouth 2 (two) times daily.  . Multiple Vitamins-Minerals (MULTIVITAMIN WITH MINERALS) tablet Take 1 tablet by mouth daily.  . nitroGLYCERIN (NITROSTAT) 0.4 MG SL tablet PLACE 1 TABLET UNDER THE TONGUE EVERY 5 MINUTES AS NEEDED FOR CHEST PAIN  . omeprazole (PRILOSEC) 20 MG capsule TAKE 1 CAPSULE(20 MG) BY MOUTH DAILY  . sertraline (ZOLOFT) 50 MG tablet TAKE 1/2 TABLET BY MOUTH AT BEDTIME  . simvastatin (ZOCOR) 10 MG tablet Take 1 tablet every day   No facility-administered encounter medications on file as of 12/30/2015.     Functional Status:   In your present state of health, do you have any difficulty performing the following activities: 12/30/2015 11/20/2015  Hearing? [name]  Vision? Y Y  Difficulty concentrating or making  decisions? [name]  Walking or climbing stairs? Y Y  Dressing or bathing? Y Y  Doing errands, shopping? [name]  Preparing Food and eating ? Y -  Using the Toilet? N -  In the past six months, have you accidently leaked urine? Y -  Do you have problems with loss of bowel control? Y -  Managing your Medications? N -  Managing your Finances? N -  Housekeeping or managing your Housekeeping? Y -  Some recent data might be hidden    Fall/Depression Screening:    Fall Risk  12/30/2015 11/20/2015 11/17/2015 11/11/2015 08/05/2015  Falls in the past year? Yes Yes Yes Yes Yes  Number falls in past yr: 2 or more 2 or more 2 or more 2 or more 2 or more  Injury with Fall? - No No No Yes  Risk Factor Category  - High Fall Risk High Fall Risk High Fall Risk High Fall Risk  Risk for fall due to : History of fall(s);Impaired balance/gait;Impaired mobility;Impaired vision;Medication side effect History of fall(s);Impaired balance/gait;Impaired mobility History of fall(s);Impaired balance/gait;Impaired mobility;Impaired vision;Medication side effect History of fall(s);Impaired balance/gait;Medication side effect History of fall(s)  Risk for fall due to (comments): - - - - -  Follow up - - Follow up appointment Falls prevention discussed -   PHQ 2/9 Scores 12/30/2015 11/20/2015 11/17/2015 11/11/2015 08/05/2015 10/16/2014 08/20/2014  PHQ - 2 Score 3 0 0 1 1 0 6  PHQ- 9 Score 7 - - - - 1 12    Assessment:   Patient expressed feelings of depression. Patient has history of drpession, has PHQ of 7. Patient has history of falls, screened at risk for fall.  Patient to use walker when ambulation to steady her gait.  Patient's niece, Lisa Haynes, is her primary support.   Plan:  Referral [facility] for [facility] L[facility] for assessment and recommendation for care. Patient has and niece to call [employer] [facility] offered through [place]

## 2015-12-31 ENCOUNTER — Other Ambulatory Visit: Payer: Self-pay | Admitting: Licensed Clinical Social Worker

## 2015-12-31 DIAGNOSIS — E039 Hypothyroidism, unspecified: Secondary | ICD-10-CM | POA: Diagnosis not present

## 2015-12-31 DIAGNOSIS — M150 Primary generalized (osteo)arthritis: Secondary | ICD-10-CM | POA: Diagnosis not present

## 2015-12-31 DIAGNOSIS — F419 Anxiety disorder, unspecified: Secondary | ICD-10-CM | POA: Diagnosis not present

## 2015-12-31 DIAGNOSIS — I10 Essential (primary) hypertension: Secondary | ICD-10-CM | POA: Diagnosis not present

## 2015-12-31 DIAGNOSIS — R296 Repeated falls: Secondary | ICD-10-CM | POA: Diagnosis not present

## 2015-12-31 DIAGNOSIS — I480 Paroxysmal atrial fibrillation: Secondary | ICD-10-CM | POA: Diagnosis not present

## 2015-12-31 DIAGNOSIS — F329 Major depressive disorder, single episode, unspecified: Secondary | ICD-10-CM | POA: Diagnosis not present

## 2015-12-31 DIAGNOSIS — M810 Age-related osteoporosis without current pathological fracture: Secondary | ICD-10-CM | POA: Diagnosis not present

## 2015-12-31 NOTE — Telephone Encounter (Signed)
Returned BorgWarnerSusan's call - no answer; message left.

## 2015-12-31 NOTE — Telephone Encounter (Signed)
Susan,OT from [facility] - states pt has not met her goal/needs to be more independent with home activities. Verbal order given "4 more OT visits". If not ok, let me know.

## 2015-12-31 NOTE — Telephone Encounter (Signed)
Thank you, I agree Ms. Daddona should continue receiving occupational therapy visits until goals are met.

## 2015-12-31 NOTE — Patient Outreach (Addendum)
[facility]) Care Management  12/31/2015  Lisa Haynes 02-13-1941 [id number]   Assessment- CSW completed outreach to patient after [facility]. CSW completed outreach and spoke to niece. Consent signed. HIPPA verifications provided. CSW introduced self, reason for call and of [facility] social work services. Niece put patient on the phone briefly. She shares that this CSW can talk to niece about her care since she is hard of hearing. CSW spoke to niece.   Niece reports that patient is currently taking antidepressants but that she does not feel it is effectively working for her. CSW encouraged family to discuss this concern with her PCP to see if they can adjust or change medications. CSW reviewed mental health resources with niece over the phone. CSW has 7 pages of mental health agencies that will accept Medicaid [place] to [place]. CSW also has a condensed list of mental health resources for [place] as well. Patient would not be a likely candidate for the [facility] since it is telephonic and she is hard of hearing but CSW still educated niece on program. Family declined referral. Patient has lost 4 brothers within 4 years. CSW educated niece on grief resources as well. CSW informed her that there are both individual and group therapy free of charge. When CSW was reviewing these resources, patient's niece stated that they may not use resources because they would need a therapist to come to her residence. CSW does not know a therapist that can come to their residence. CSW questioned how patient is transported to her physician appointments and she stated that the family transports her. CSW encouraged family to consider transporting patient to mental health appointments as well. Family state that they will consider this. Family will allow CSW to mail mental health resources and grief resources to residence.  Resource education successfully reviewed and will be provided. Family denies wishing to complete an advance directive at this time. RNCM has sent PCP office personal care services application. CSW has discussed case with patient and CSW does not need to assist with this process. Family denies any further social work needs at this time. CSW will not open case as there are no further social work needs. Family was encouraged to consider these resource options. Family was encouraged to contact this CSW if they had any further questions.   Plan-CSW will update RNCM. CSW will mail out mental health and grief resources within one week.   Dickie LaBrooke Lisa Haynes, BSW, MSW, LCSW [facility] Care Management [email or web address] Phone: [phone] Fax: [phone]

## 2016-01-02 DIAGNOSIS — E039 Hypothyroidism, unspecified: Secondary | ICD-10-CM | POA: Diagnosis not present

## 2016-01-02 DIAGNOSIS — K529 Noninfective gastroenteritis and colitis, unspecified: Secondary | ICD-10-CM | POA: Diagnosis not present

## 2016-01-02 DIAGNOSIS — R296 Repeated falls: Secondary | ICD-10-CM | POA: Diagnosis not present

## 2016-01-02 DIAGNOSIS — M810 Age-related osteoporosis without current pathological fracture: Secondary | ICD-10-CM | POA: Diagnosis not present

## 2016-01-02 DIAGNOSIS — I480 Paroxysmal atrial fibrillation: Secondary | ICD-10-CM | POA: Diagnosis not present

## 2016-01-02 DIAGNOSIS — M150 Primary generalized (osteo)arthritis: Secondary | ICD-10-CM | POA: Diagnosis not present

## 2016-01-02 DIAGNOSIS — F329 Major depressive disorder, single episode, unspecified: Secondary | ICD-10-CM | POA: Diagnosis not present

## 2016-01-02 DIAGNOSIS — F419 Anxiety disorder, unspecified: Secondary | ICD-10-CM | POA: Diagnosis not present

## 2016-01-02 DIAGNOSIS — I10 Essential (primary) hypertension: Secondary | ICD-10-CM | POA: Diagnosis not present

## 2016-01-05 DIAGNOSIS — K529 Noninfective gastroenteritis and colitis, unspecified: Secondary | ICD-10-CM | POA: Diagnosis not present

## 2016-01-06 ENCOUNTER — Encounter (HOSPITAL_COMMUNITY): Payer: Self-pay | Admitting: Nurse Practitioner

## 2016-01-06 ENCOUNTER — Ambulatory Visit (HOSPITAL_COMMUNITY)
Admission: RE | Admit: 2016-01-06 | Discharge: 2016-01-06 | Disposition: A | Discharge status: Home / Self Care | Payer: Commercial Managed Care - HMO | Source: Ambulatory Visit | Attending: Nurse Practitioner | Admitting: Nurse Practitioner

## 2016-01-06 VITALS — BP 114/60 | HR 49 | Ht 62.0 in | Wt 214.2 lb

## 2016-01-06 DIAGNOSIS — E039 Hypothyroidism, unspecified: Secondary | ICD-10-CM | POA: Diagnosis not present

## 2016-01-06 DIAGNOSIS — Z9049 Acquired absence of other specified parts of digestive tract: Secondary | ICD-10-CM | POA: Diagnosis not present

## 2016-01-06 DIAGNOSIS — I10 Essential (primary) hypertension: Secondary | ICD-10-CM | POA: Diagnosis not present

## 2016-01-06 DIAGNOSIS — Z9889 Other specified postprocedural states: Secondary | ICD-10-CM | POA: Diagnosis not present

## 2016-01-06 DIAGNOSIS — I480 Paroxysmal atrial fibrillation: Secondary | ICD-10-CM | POA: Diagnosis not present

## 2016-01-06 DIAGNOSIS — Z79899 Other long term (current) drug therapy: Secondary | ICD-10-CM | POA: Diagnosis not present

## 2016-01-06 DIAGNOSIS — I495 Sick sinus syndrome: Secondary | ICD-10-CM | POA: Diagnosis not present

## 2016-01-06 DIAGNOSIS — F329 Major depressive disorder, single episode, unspecified: Secondary | ICD-10-CM | POA: Insufficient documentation

## 2016-01-06 DIAGNOSIS — Z7901 Long term (current) use of anticoagulants: Secondary | ICD-10-CM | POA: Diagnosis not present

## 2016-01-06 DIAGNOSIS — M1990 Unspecified osteoarthritis, unspecified site: Secondary | ICD-10-CM | POA: Insufficient documentation

## 2016-01-06 DIAGNOSIS — M810 Age-related osteoporosis without current pathological fracture: Secondary | ICD-10-CM | POA: Diagnosis not present

## 2016-01-06 DIAGNOSIS — F419 Anxiety disorder, unspecified: Secondary | ICD-10-CM | POA: Insufficient documentation

## 2016-01-06 DIAGNOSIS — M150 Primary generalized (osteo)arthritis: Secondary | ICD-10-CM | POA: Diagnosis not present

## 2016-01-06 DIAGNOSIS — K219 Gastro-esophageal reflux disease without esophagitis: Secondary | ICD-10-CM | POA: Insufficient documentation

## 2016-01-06 DIAGNOSIS — R296 Repeated falls: Secondary | ICD-10-CM | POA: Diagnosis not present

## 2016-01-07 DIAGNOSIS — E039 Hypothyroidism, unspecified: Secondary | ICD-10-CM | POA: Diagnosis not present

## 2016-01-07 DIAGNOSIS — R296 Repeated falls: Secondary | ICD-10-CM | POA: Diagnosis not present

## 2016-01-07 DIAGNOSIS — I480 Paroxysmal atrial fibrillation: Secondary | ICD-10-CM | POA: Diagnosis not present

## 2016-01-07 DIAGNOSIS — M150 Primary generalized (osteo)arthritis: Secondary | ICD-10-CM | POA: Diagnosis not present

## 2016-01-07 DIAGNOSIS — F419 Anxiety disorder, unspecified: Secondary | ICD-10-CM | POA: Diagnosis not present

## 2016-01-07 DIAGNOSIS — I10 Essential (primary) hypertension: Secondary | ICD-10-CM | POA: Diagnosis not present

## 2016-01-07 DIAGNOSIS — F329 Major depressive disorder, single episode, unspecified: Secondary | ICD-10-CM | POA: Diagnosis not present

## 2016-01-07 DIAGNOSIS — M810 Age-related osteoporosis without current pathological fracture: Secondary | ICD-10-CM | POA: Diagnosis not present

## 2016-01-07 DIAGNOSIS — K529 Noninfective gastroenteritis and colitis, unspecified: Secondary | ICD-10-CM | POA: Diagnosis not present

## 2016-01-07 NOTE — Progress Notes (Signed)
Primary Care Physician: Eulah PontNina Blum, MD Referring Physician: Dr. Paralee CancelSkains   Lisa Haynes is a 75 y.o. female with a h/o afib/flutter, HTN, anxiety that is in the afib clinic for evaluation of a recent holter showing afib with RVR. She was admitted to  the hospital 7/11-7/13 after finding pt with a HR of 150's in the Internal Medicine clinic. Troponin's were negative and she was found to have a flutter. Stress testing was discussed because pt also had c/o of chest pain but it was deferred. She was originally found to have afib as an isolated incident with GB surgery in Feb 2016. She was started on anticoagulation for CHA2DS2VASc score of 4. She was also d/c on metoprolol 25 mg bid.A holter monitor was placed and she was found to have non sustained PAF with v rates up to 200 bpm, and sinus brady at 40 bpm, seen once at 9am. Dr. Anne FuSkains referred here to discuss antiarrythmic's, possibly tikosyn.  She is a sedentary, deconditioned female that walks with aid of walker. She is here with a niece. She describes an heaviness in her chest that will radiate to her left arm when she tries to sweep, mop and or walk a long distance. This starts with a heaviness but if she continues, her heart will pound. She can sit down and rest and the symptoms will resolve. That is the only time she is aware of an irregular heartbeat.  Discussed need for stress test with her symptoms and she does not desire to have one at this time. No rest symptoms. She did have a stress myoview in 2016 prior to GB surgery which was low risk. Also discussed antiarrythmic's and she is not interested in starting a new drug or coming into the hospital to receive a drug to keep her in rhythm. At this time, she does not consider the occasional heart pounding to be an big interruption to her lifestyle ans she would prefer to keep things the same for now, unless her symptoms escalate. Her heartbeat is on slow side today but she is not symptomatic with  this.  Today, she denies symptoms of shortness of breath, orthopnea, PND, lower extremity edema, dizziness, presyncope, syncope, or neurologic sequela. The patient is tolerating medications without difficulties and is otherwise without complaint today.   Past Medical History:  Diagnosis Date  . Bilateral knee pain   . Depression   . GERD (gastroesophageal reflux disease)   . High cholesterol   . Hypertension   . Low back pain   . Osteoarthritis    "legs" (11/11/2015)  . Osteoporosis   . Paroxysmal atrial fibrillation (HCC) 08/20/2014  . Pneumonia    hx of PNA 2013  . Urinary incontinence    Past Surgical History:  Procedure Laterality Date  . APPENDECTOMY    . CHOLECYSTECTOMY N/A 10/01/2014   Procedure: LAPAROSCOPIC CHOLECYSTECTOMY ;  Surgeon: Harriette Bouillonhomas Cornett, MD;  Location: [facility] OR;  Service: General;  Laterality: N/A;  . DILATION AND CURETTAGE OF UTERUS     "after I had my boys"  . ERCP N/A 09/27/2014   Procedure: ENDOSCOPIC RETROGRADE CHOLANGIOPANCREATOGRAPHY (ERCP);  Surgeon: Jeani HawkingPatrick Hung, MD;  [facility];  Service: Endoscopy;  Laterality: N/A;  . VAGINAL HYSTERECTOMY     with unilateral oophorectomy    Current Outpatient Prescriptions  Medication Sig Dispense Refill  . acetaminophen (TYLENOL) 325 MG tablet Take 2 tablets (650 mg total) by mouth every 6 (six) hours as needed for pain. 60 tablet  3  . alendronate (FOSAMAX) 70 MG tablet Take 1 tablet (70 mg total) by mouth every 7 (seven) days. Take with a full glass of water on an empty stomach. 4 tablet 11  . apixaban (ELIQUIS) 5 MG TABS tablet Take 1 tablet (5 mg total) by mouth 2 (two) times daily. 60 tablet 1  . Calcium Carbonate-Vitamin D (CALCARB 600/D) 600-400 MG-UNIT per tablet Take 1 tablet by mouth 3 (three) times daily with meals. (Patient taking differently: Take 1 tablet by mouth daily. ) 30 tablet 2  . ibuprofen (ADVIL,MOTRIN) 200 MG tablet Take 200 mg by mouth every 6 (six) hours as needed for moderate  pain. Reported on 11/20/2015    . lisinopril (PRINIVIL,ZESTRIL) 10 MG tablet TAKE 1 TABLET BY MOUTH DAILY 90 tablet 3  . metoprolol tartrate (LOPRESSOR) 25 MG tablet Take 1 tablet (25 mg total) by mouth 2 (two) times daily.    . Multiple Vitamins-Minerals (MULTIVITAMIN WITH MINERALS) tablet Take 1 tablet by mouth daily.    . nitroGLYCERIN (NITROSTAT) 0.4 MG SL tablet PLACE 1 TABLET UNDER THE TONGUE EVERY 5 MINUTES AS NEEDED FOR CHEST PAIN 90 tablet 0  . omeprazole (PRILOSEC) 20 MG capsule TAKE 1 CAPSULE(20 MG) BY MOUTH DAILY 30 capsule 3  . sertraline (ZOLOFT) 50 MG tablet TAKE 1/2 TABLET BY MOUTH AT BEDTIME 30 tablet 6  . simvastatin (ZOCOR) 10 MG tablet Take 1 tablet every day 90 tablet 0   No current facility-administered medications for this encounter.     No Known Allergies  Social History   Social History  . Marital status: Widowed    Spouse name: N/A  . Number of children: N/A  . Years of education: N/A   Occupational History  . Retired    Social History Main Topics  . Smoking status: Never Smoker  . Smokeless tobacco: Never Used  . Alcohol use No  . Drug use: No  . Sexual activity: No   Other Topics Concern  . Not on file   Social History Narrative   Retired: Motel work   Divorced    Regular exercise- no   Lives alone    Family History  Problem Relation Age of Onset  . Heart disease Mother     onset 73s  . Heart disease Brother 42    ROS- All systems are reviewed and negative except as per the HPI above  Physical Exam: Vitals:   01/06/16 1411  BP: 114/60  Pulse: (!) 49  Weight: 214 lb 3.2 oz (97.2 kg)  Height: 5\' 2"  (1.575 m)    GEN- The patient is well appearing, alert and oriented x 3 today.   Head- normocephalic, atraumatic Eyes-  Sclera clear, conjunctiva pink Ears- hearing intact Oropharynx- clear Neck- supple, no JVP Lymph- no cervical lymphadenopathy Lungs- Clear to ausculation bilaterally, normal work of breathing Heart- Regular rate  and rhythm, no murmurs, rubs or gallops, PMI not laterally displaced GI- soft, NT, ND, + BS Extremities- no clubbing, cyanosis, or edema MS- no significant deformity or atrophy Skin- no rash or lesion Psych- euthymic mood, full affect Neuro- strength and sensation are intact  EKG-Sinus brady at 49 bpm, pr int 184 ms, qrs int 64 ms, qtc 395 ms Epic records reviewed Monitor-8/28-Study Highlights     Paroxysmal atrial fibrillation - HR as high as 200bpm  Sinus bradycardia - 40 BPM (once seen at 9am)     Assessment and Plan: 1. Tachy-brady syndrome Pt states symptoms not that concerning to her  at this time and she defers any antiarrythmic, especially if it means coming into the hospital. With her bradycardia, Delle Reining would be best choice since it does not slow the rhythm.  She does not appear to be symptomatic with bradycardia so will continue metoprolol 25 mg bid. Continue apixaban.  2. Chest/arm discomfort with exertion Low risk stress myoview 2/16  I discussed obtaining  a stress test to see if symptoms are ischemic in nature and pt refuses stress test at this time as well. If symptoms escalate, she will reconsider.  3. HTN Well managed  F/u in one month, sooner if symptoms escalate.  Elvina Sidle Matthew Folks [facility] [address] [place], [place] [address] [phone]

## 2016-01-08 DIAGNOSIS — K529 Noninfective gastroenteritis and colitis, unspecified: Secondary | ICD-10-CM | POA: Diagnosis not present

## 2016-01-09 ENCOUNTER — Other Ambulatory Visit: Payer: Self-pay | Admitting: Internal Medicine

## 2016-01-09 DIAGNOSIS — K529 Noninfective gastroenteritis and colitis, unspecified: Secondary | ICD-10-CM | POA: Diagnosis not present

## 2016-01-10 ENCOUNTER — Other Ambulatory Visit: Payer: Self-pay | Admitting: Internal Medicine

## 2016-01-10 DIAGNOSIS — I480 Paroxysmal atrial fibrillation: Secondary | ICD-10-CM

## 2016-01-12 ENCOUNTER — Other Ambulatory Visit: Payer: Self-pay | Admitting: Internal Medicine

## 2016-01-12 ENCOUNTER — Other Ambulatory Visit: Payer: Self-pay

## 2016-01-12 DIAGNOSIS — I480 Paroxysmal atrial fibrillation: Secondary | ICD-10-CM

## 2016-01-12 DIAGNOSIS — K529 Noninfective gastroenteritis and colitis, unspecified: Secondary | ICD-10-CM | POA: Diagnosis not present

## 2016-01-12 NOTE — Telephone Encounter (Signed)
Message sent to front office for an appt.  

## 2016-01-14 DIAGNOSIS — K529 Noninfective gastroenteritis and colitis, unspecified: Secondary | ICD-10-CM | POA: Diagnosis not present

## 2016-01-15 DIAGNOSIS — K529 Noninfective gastroenteritis and colitis, unspecified: Secondary | ICD-10-CM | POA: Diagnosis not present

## 2016-01-15 NOTE — Progress Notes (Signed)
This encounter was created in error - please disregard.

## 2016-01-16 ENCOUNTER — Other Ambulatory Visit: Payer: Self-pay

## 2016-01-16 DIAGNOSIS — K529 Noninfective gastroenteritis and colitis, unspecified: Secondary | ICD-10-CM | POA: Diagnosis not present

## 2016-01-16 NOTE — Patient Outreach (Signed)
       Unsuccessful attempt made to contact patient via telephone to assess need for community care coordinator and to schedule home visit. Unable to leave HIPPA message as no recording device picked up.  Plan: Make another attempt to contact patient via telephone later this month.

## 2016-01-19 ENCOUNTER — Other Ambulatory Visit: Payer: Self-pay

## 2016-01-19 DIAGNOSIS — K529 Noninfective gastroenteritis and colitis, unspecified: Secondary | ICD-10-CM | POA: Diagnosis not present

## 2016-01-19 NOTE — Patient Outreach (Signed)
[facility]) Care Management  01/19/2016   Lisa Haynes 02/25/1941 [id number]  Subjective:  I have not called any of the agencies to get counseling. I have been feeling better  Objective:  Telephone contact  Current Medications:  Current Outpatient Prescriptions  Medication Sig Dispense Refill  . acetaminophen (TYLENOL) 325 MG tablet Take 2 tablets (650 mg total) by mouth every 6 (six) hours as needed for pain. 60 tablet 3  . alendronate (FOSAMAX) 70 MG tablet Take 1 tablet (70 mg total) by mouth every 7 (seven) days. Take with a full glass of water on an empty stomach. 4 tablet 11  . Calcium Carbonate-Vitamin D (CALCARB 600/D) 600-400 MG-UNIT per tablet Take 1 tablet by mouth 3 (three) times daily with meals. (Patient taking differently: Take 1 tablet by mouth daily. ) 30 tablet 2  . ELIQUIS 5 MG TABS tablet TAKE 1 TABLET BY MOUTH TWICE DAILY 60 tablet 1  . lisinopril (PRINIVIL,ZESTRIL) 10 MG tablet TAKE 1 TABLET BY MOUTH DAILY 90 tablet 3  . metoprolol tartrate (LOPRESSOR) 25 MG tablet TAKE 1 TABLET BY MOUTH TWICE DAILY 180 tablet 1  . Multiple Vitamins-Minerals (MULTIVITAMIN WITH MINERALS) tablet Take 1 tablet by mouth daily.    . nitroGLYCERIN (NITROSTAT) 0.4 MG SL tablet PLACE 1 TABLET UNDER THE TONGUE EVERY 5 MINUTES AS NEEDED FOR CHEST PAIN 90 tablet 0  . omeprazole (PRILOSEC) 20 MG capsule TAKE 1 CAPSULE(20 MG) BY MOUTH DAILY 30 capsule 3  . sertraline (ZOLOFT) 50 MG tablet TAKE 1/2 TABLET BY MOUTH AT BEDTIME 30 tablet 6  . simvastatin (ZOCOR) 10 MG tablet Take 1 tablet every day 90 tablet 0   No current facility-administered medications for this visit.     Functional Status:  In your present state of health, do you have any difficulty performing the following activities: 12/30/2015 11/20/2015  Hearing? Tempie Donning  Vision? Y Y  Difficulty concentrating or making decisions? Tempie Donning  Walking or climbing stairs? Y Y  Dressing or bathing? Y Y  Doing errands, shopping?  Tempie Donning  Preparing Food and eating ? Y -  Using the Toilet? N -  In the past six months, have you accidently leaked urine? Y -  Do you have problems with loss of bowel control? Y -  Managing your Medications? N -  Managing your Finances? N -  Housekeeping or managing your Housekeeping? Y -  Some recent data might be hidden    Fall/Depression Screening: PHQ 2/9 Scores 12/31/2015 12/30/2015 11/20/2015 11/17/2015 11/11/2015 08/05/2015 10/16/2014  PHQ - 2 Score 2 3 0 0 1 1 0  PHQ- 9 Score 6 7 - - - - 1   Fall Risk  01/12/2016 12/30/2015 11/20/2015 11/17/2015 11/11/2015  Falls in the past year? - Yes Yes Yes Yes  Number falls in past yr: - 2 or more 2 or more 2 or more 2 or more  Injury with Fall? No - No No No  Risk Factor Category  High Fall Risk - High Fall Risk High Fall Risk High Fall Risk  Risk for fall due to : History of fall(s);Impaired balance/gait;Impaired mobility;Impaired vision;Medication side effect History of fall(s);Impaired balance/gait;Impaired mobility;Impaired vision;Medication side effect History of fall(s);Impaired balance/gait;Impaired mobility History of fall(s);Impaired balance/gait;Impaired mobility;Impaired vision;Medication side effect History of fall(s);Impaired balance/gait;Medication side effect  Risk for fall due to (comments): - - - - -  Follow up Follow up appointment - - Follow up appointment Falls prevention discussed    [facility] CM Care  Plan Problem One   Flowsheet Row Most Recent Value  Care Plan Problem One  deficits in ADLS and IADLs  Role Documenting the Problem One  Care Management Coordinator  Care Plan for Problem One  Active  [facility] Long Term Goal (31-90 days)  In the next 31 days, patient will particiate in an assessment conducted by practitioner from the [place] Term Goal Start Date  12/30/15  Interventions for Problem One Long Term Goal  telephone contact made to schedule home visit for later this month  [facility] CM Short Term Goal #1 (0-30  days)  In the next 7 days, patient contact [place] [facility] to follow up with her application for services  [facility] CM Short Term Goal #1 Start Date  12/30/15  [facility] CM Short Term Goal #1 Met Date  01/19/16  Interventions for Short Term Goal #1  RNCM made telepohne contact to assess patient's progress in reaching her case management goals    [facility] CM Care Plan Problem Two   Flowsheet Row Most Recent Value  Care Plan Problem Two  Patient had a PHQ score above 5  Role Documenting the Problem Two  Care Management [place] for Problem Two  Active  [facility] CM Short Term Goal #1 (0-30 days)  In the next 21 days, patient will make have spoken with a [facility] LCSW to assess for community resources for mental health follow up  [facility] CM Short Term Goal #1 Start Date  12/30/15  [facility] CM Short Term Goal #1 Met Date   01/19/16  Interventions for Short Term Goal #2   patient states she has received the information.  Patient stated she has not followed through on contacting resources     Assessment:  Patient agreed to home visit later this month. Patient continues to reports a sedentary life, frequent visit from her niece and nephew.  Plan:  Home visit later this month

## 2016-01-21 DIAGNOSIS — K529 Noninfective gastroenteritis and colitis, unspecified: Secondary | ICD-10-CM | POA: Diagnosis not present

## 2016-01-22 DIAGNOSIS — K529 Noninfective gastroenteritis and colitis, unspecified: Secondary | ICD-10-CM | POA: Diagnosis not present

## 2016-01-23 DIAGNOSIS — K529 Noninfective gastroenteritis and colitis, unspecified: Secondary | ICD-10-CM | POA: Diagnosis not present

## 2016-01-26 DIAGNOSIS — K529 Noninfective gastroenteritis and colitis, unspecified: Secondary | ICD-10-CM | POA: Diagnosis not present

## 2016-01-27 ENCOUNTER — Other Ambulatory Visit: Payer: Self-pay

## 2016-01-27 NOTE — Patient Outreach (Signed)
[facility]isa Hay[facility]) Care Management   01/27/2016  Lisa Haynes [birth year] [id number]  Lisa Haynes is an 75 y.o. female  Subjective:  My son is back now and he will help me. I am doing fine, I learned a lot about blood thinners.  Objective:   ROS  Patient uses her walker to stabilize her gate when ambulating. Patient is a well nourished, elderly female.   Physical Exam ROS Encounter Medications:   Outpatient Encounter Prescriptions as of 01/27/2016  Medication Sig  . acetaminophen (TYLENOL) 325 MG tablet Take 2 tablets (650 mg total) by mouth every 6 (six) hours as needed for pain.  Marland Kitchen alendronate (FOSAMAX) 70 MG tablet Take 1 tablet (70 mg total) by mouth every 7 (seven) days. Take with a full glass of water on an empty stomach.  . Calcium Carbonate-Vitamin D (CALCARB 600/D) 600-400 MG-UNIT per tablet Take 1 tablet by mouth 3 (three) times daily with meals. (Patient taking differently: Take 1 tablet by mouth daily. )  . ELIQUIS 5 MG TABS tablet TAKE 1 TABLET BY MOUTH TWICE DAILY  . lisinopril (PRINIVIL,ZESTRIL) 10 MG tablet TAKE 1 TABLET BY MOUTH DAILY  . metoprolol tartrate (LOPRESSOR) 25 MG tablet TAKE 1 TABLET BY MOUTH TWICE DAILY  . Multiple Vitamins-Minerals (MULTIVITAMIN WITH MINERALS) tablet Take 1 tablet by mouth daily.  . nitroGLYCERIN (NITROSTAT) 0.4 MG SL tablet PLACE 1 TABLET UNDER THE TONGUE EVERY 5 MINUTES AS NEEDED FOR CHEST PAIN  . omeprazole (PRILOSEC) 20 MG capsule TAKE 1 CAPSULE(20 MG) BY MOUTH DAILY  . sertraline (ZOLOFT) 50 MG tablet TAKE 1/2 TABLET BY MOUTH AT BEDTIME  . simvastatin (ZOCOR) 10 MG tablet Take 1 tablet every day   No facility-administered encounter medications on file as of 01/27/2016.     Functional Status:   In your present state of health, do you have any difficulty performing the following activities: 12/30/2015 11/20/2015  Hearing? Tempie Donning  Vision? Y Y  Difficulty concentrating or making decisions? Tempie Donning  Walking or climbing  stairs? Y Y  Dressing or bathing? Y Y  Doing errands, shopping? Tempie Donning  Preparing Food and eating ? Y -  Using the Toilet? N -  In the past six months, have you accidently leaked urine? Y -  Do you have problems with loss of bowel control? Y -  Managing your Medications? N -  Managing your Finances? N -  Housekeeping or managing your Housekeeping? Y -  Some recent data might be hidden   [facility] CM Care Plan Problem One   Flowsheet Row Most Recent Value  Care Plan Problem One  deficits in ADLS and IADLs  Role Documenting the Problem One  Care Management [place] for Problem One  Active  [facility]N Long Term Goal (31-90 days)  In the next 31 days, patient will particiate in an assessment conducted by practitioner from the [place] Term Goal Start Date  12/30/15  [facility] Long Term Goal Met Date  01/27/16  Interventions for Problem One Long Term Goal  telephone contact made to schedule home visit for later this month  [facility] CM Short Term Goal #1 (0-30 days)  In the next 7 days, patient contact [place] [facility] to follow up with her application for services  [facility] CM Short Term Goal #1 Start Date  12/30/15  [facility] CM Short Term Goal #1 Met Date  01/19/16  Interventions for Short Term Goal #1  RNCM made  telepohne contact to assess patient's progress in reaching her case management goals    [facility] CM Care Plan Problem Two   Flowsheet Row Most Recent Value  Care Plan Problem Two  Patient had a PHQ score above 5  Role Documenting the Problem Two  Care Management Coordinator  Care Plan for Problem Two  Active  [facility] CM Short Term Goal #1 (0-30 days)  In the next 21 days, patient will make have spoken with a [facility] LCSW to assess for community resources for mental health follow up  [facility] CM Short Term Goal #1 Start Date  12/30/15  [facility] CM Short Term Goal #1 Met Date   01/19/16  Interventions for Short Term Goal #2   patient states she has received the information.  Patient stated she has not  followed through on contacting resources     Fall/Depression Screening:    PHQ 2/9 Scores 12/31/2015 12/30/2015 11/20/2015 11/17/2015 11/11/2015 08/05/2015 10/16/2014  PHQ - 2 Score 2 3 0 0 1 1 0  PHQ- 9 Score 6 7 - - - - 1   Fall Risk  01/12/2016 12/30/2015 11/20/2015 11/17/2015 11/11/2015  Falls in the past year? - Yes Yes Yes Yes  Number falls in past yr: - 2 or more 2 or more 2 or more 2 or more  Injury with Fall? No - No No No  Risk Factor Category  High Fall Risk - High Fall Risk High Fall Risk High Fall Risk  Risk for fall due to : History of fall(s);Impaired balance/gait;Impaired mobility;Impaired vision;Medication side effect History of fall(s);Impaired balance/gait;Impaired mobility;Impaired vision;Medication side effect History of fall(s);Impaired balance/gait;Impaired mobility History of fall(s);Impaired balance/gait;Impaired mobility;Impaired vision;Medication side effect History of fall(s);Impaired balance/gait;Medication side effect  Risk for fall due to (comments): - - - - -  Follow up Follow up appointment - - Follow up appointment Falls prevention discussed   Assessment:   Patient has very good family support, with son and son's girlfriend living with her now. Patient has met her case  Management goals.  Plan:  Discharge from caseload, patient has met her case management goals.

## 2016-01-28 DIAGNOSIS — K529 Noninfective gastroenteritis and colitis, unspecified: Secondary | ICD-10-CM | POA: Diagnosis not present

## 2016-01-30 DIAGNOSIS — K529 Noninfective gastroenteritis and colitis, unspecified: Secondary | ICD-10-CM | POA: Diagnosis not present

## 2016-02-02 DIAGNOSIS — K520 Gastroenteritis and colitis due to radiation: Secondary | ICD-10-CM | POA: Diagnosis not present

## 2016-02-04 ENCOUNTER — Ambulatory Visit (HOSPITAL_COMMUNITY)
Admission: RE | Admit: 2016-02-04 | Discharge: 2016-02-04 | Disposition: A | Discharge status: Home / Self Care | Payer: Commercial Managed Care - HMO | Source: Ambulatory Visit | Attending: Nurse Practitioner | Admitting: Nurse Practitioner

## 2016-02-04 ENCOUNTER — Encounter (HOSPITAL_COMMUNITY): Payer: Self-pay | Admitting: Nurse Practitioner

## 2016-02-04 VITALS — BP 140/66 | HR 50 | Ht 62.0 in | Wt 211.8 lb

## 2016-02-04 DIAGNOSIS — I480 Paroxysmal atrial fibrillation: Secondary | ICD-10-CM

## 2016-02-04 DIAGNOSIS — R001 Bradycardia, unspecified: Secondary | ICD-10-CM | POA: Diagnosis not present

## 2016-02-04 DIAGNOSIS — Z6838 Body mass index (BMI) 38.0-38.9, adult: Secondary | ICD-10-CM | POA: Diagnosis not present

## 2016-02-04 DIAGNOSIS — Z79899 Other long term (current) drug therapy: Secondary | ICD-10-CM | POA: Diagnosis not present

## 2016-02-04 DIAGNOSIS — K520 Gastroenteritis and colitis due to radiation: Secondary | ICD-10-CM | POA: Diagnosis not present

## 2016-02-04 DIAGNOSIS — I10 Essential (primary) hypertension: Secondary | ICD-10-CM | POA: Diagnosis not present

## 2016-02-04 DIAGNOSIS — Z7901 Long term (current) use of anticoagulants: Secondary | ICD-10-CM | POA: Diagnosis not present

## 2016-02-04 DIAGNOSIS — E669 Obesity, unspecified: Secondary | ICD-10-CM | POA: Diagnosis not present

## 2016-02-04 NOTE — Progress Notes (Signed)
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 female who presents today for routine AF clinic followup.  She is doing reasonably well.  She is not very active.  She has lost 3 lbs. Today, she denies symptoms of palpitations, chest pain, shortness of breath,  lower extremity edema, dizziness, presyncope, or syncope.  The patient is otherwise without complaint today.   Past Medical History:  Diagnosis Date  . Bilateral knee pain   . Depression   . GERD (gastroesophageal reflux disease)   . High cholesterol   . Hypertension   . Low back pain   . Osteoarthritis    "legs" (11/11/2015)  . Osteoporosis   . Paroxysmal atrial fibrillation (HCC) 08/20/2014  . Pneumonia    hx of PNA 2013  . Urinary incontinence    Past Surgical History:  Procedure Laterality Date  . APPENDECTOMY    . CHOLECYSTECTOMY N/A 10/01/2014   Procedure: LAPAROSCOPIC CHOLECYSTECTOMY ;  Surgeon: Harriette Bouillon, MD;  Location: [facility] OR;  Service: General;  Laterality: N/A;  . DILATION AND CURETTAGE OF UTERUS     "after I had my boys"  . ERCP N/A 09/27/2014   Procedure: ENDOSCOPIC RETROGRADE CHOLANGIOPANCREATOGRAPHY (ERCP);  Surgeon: Jeani Hawking, MD;  Location[facility] ENDOSCOPY;  Service: Endoscopy;  Laterality: N/A;  . VAGINAL HYSTERECTOMY     with unilateral oophorectomy    ROS- all systems are reviewed and negatives except as per HPI above  Current Outpatient Prescriptions  Medication Sig Dispense Refill  . acetaminophen (TYLENOL) 325 MG tablet Take 2 tablets (650 mg total) by mouth every 6 (six) hours as needed for pain. 60 tablet 3  . alendronate (FOSAMAX) 70 MG tablet Take 1 tablet (70 mg total) by mouth every 7 (seven) days. Take with a full glass of water on an empty stomach. 4 tablet 11  . Calcium Carbonate-Vitamin D (CALCARB 600/D) 600-400 MG-UNIT per tablet Take 1 tablet by mouth 3 (three) times daily with meals. (Patient taking differently: Take 1 tablet by mouth daily. )  30 tablet 2  . ELIQUIS 5 MG TABS tablet TAKE 1 TABLET BY MOUTH TWICE DAILY 60 tablet 1  . lisinopril (PRINIVIL,ZESTRIL) 10 MG tablet TAKE 1 TABLET BY MOUTH DAILY 90 tablet 3  . metoprolol tartrate (LOPRESSOR) 25 MG tablet TAKE 1 TABLET BY MOUTH TWICE DAILY 180 tablet 1  . Multiple Vitamins-Minerals (MULTIVITAMIN WITH MINERALS) tablet Take 1 tablet by mouth daily.    . nitroGLYCERIN (NITROSTAT) 0.4 MG SL tablet PLACE 1 TABLET UNDER THE TONGUE EVERY 5 MINUTES AS NEEDED FOR CHEST PAIN 90 tablet 0  . omeprazole (PRILOSEC) 20 MG capsule TAKE 1 CAPSULE(20 MG) BY MOUTH DAILY 30 capsule 3  . sertraline (ZOLOFT) 50 MG tablet TAKE 1/2 TABLET BY MOUTH AT BEDTIME 30 tablet 6  . simvastatin (ZOCOR) 10 MG tablet Take 1 tablet every day 90 tablet 0   No current facility-administered medications for this encounter.     Physical Exam: Vitals:   02/04/16 1416  BP: 140/66  Pulse: (!) 50  Weight: 211 lb 12.8 oz (96.1 kg)  Height: 5\' 2"  (1.575 m)    GEN- The patient is morbidly obese, in a wheelchair today, alert and oriented x 3 today.   Head- normocephalic, atraumatic Eyes-  Sclera clear, conjunctiva pink Ears- hearing intact Oropharynx- clear Lungs- Clear to ausculation bilaterally, normal work of breathing Heart- Regular rate and rhythm, no murmurs, rubs or gallops, PMI not laterally  displaced GI- soft, NT, ND, + BS Extremities- no clubbing, cyanosis, or edema  ekg today reveals sinus bradycardia 50 bpm,otherwise normal ekg  Assessment and Plan:  1. Paroxysmal Atrial fibrillation Maintaining sinus rhythm off of AAD therapy On eliquis Lifestyle modification discussed at length  2. Obesity Body mass index is 38.74 kg/m. Weight reduction advised  3. HTN Stable No change required today.  4. Sinus bradycardia Could reduce metoprolol however since she is doing well, I have made no changes today No indication for pacing currently  Return to see [name] in AF clinic in 3 months Follow-up  with Dr Anne FuSkains as scheduled I will see as needed  Hillis RangeJames Josphine Laffey MD, [facility] CorporationFACC 02/04/2016 2:35 PM

## 2016-02-05 DIAGNOSIS — K520 Gastroenteritis and colitis due to radiation: Secondary | ICD-10-CM | POA: Diagnosis not present

## 2016-02-06 DIAGNOSIS — K520 Gastroenteritis and colitis due to radiation: Secondary | ICD-10-CM | POA: Diagnosis not present

## 2016-02-09 DIAGNOSIS — K520 Gastroenteritis and colitis due to radiation: Secondary | ICD-10-CM | POA: Diagnosis not present

## 2016-02-11 DIAGNOSIS — K520 Gastroenteritis and colitis due to radiation: Secondary | ICD-10-CM | POA: Diagnosis not present

## 2016-02-12 DIAGNOSIS — K520 Gastroenteritis and colitis due to radiation: Secondary | ICD-10-CM | POA: Diagnosis not present

## 2016-02-13 DIAGNOSIS — K520 Gastroenteritis and colitis due to radiation: Secondary | ICD-10-CM | POA: Diagnosis not present

## 2016-02-15 ENCOUNTER — Other Ambulatory Visit: Payer: Self-pay | Admitting: Internal Medicine

## 2016-02-16 DIAGNOSIS — K529 Noninfective gastroenteritis and colitis, unspecified: Secondary | ICD-10-CM | POA: Diagnosis not present

## 2016-02-16 NOTE — Telephone Encounter (Signed)
appt 11/08 w/pcp

## 2016-02-25 DIAGNOSIS — K529 Noninfective gastroenteritis and colitis, unspecified: Secondary | ICD-10-CM | POA: Diagnosis not present

## 2016-02-26 DIAGNOSIS — K529 Noninfective gastroenteritis and colitis, unspecified: Secondary | ICD-10-CM | POA: Diagnosis not present

## 2016-02-27 DIAGNOSIS — K529 Noninfective gastroenteritis and colitis, unspecified: Secondary | ICD-10-CM | POA: Diagnosis not present

## 2016-03-01 DIAGNOSIS — R32 Unspecified urinary incontinence: Secondary | ICD-10-CM | POA: Diagnosis not present

## 2016-03-01 DIAGNOSIS — K529 Noninfective gastroenteritis and colitis, unspecified: Secondary | ICD-10-CM | POA: Diagnosis not present

## 2016-03-01 DIAGNOSIS — I10 Essential (primary) hypertension: Secondary | ICD-10-CM | POA: Diagnosis not present

## 2016-03-03 ENCOUNTER — Emergency Department (HOSPITAL_COMMUNITY): Payer: Commercial Managed Care - HMO

## 2016-03-03 ENCOUNTER — Encounter (HOSPITAL_COMMUNITY): Payer: Self-pay | Admitting: Emergency Medicine

## 2016-03-03 ENCOUNTER — Emergency Department (HOSPITAL_COMMUNITY)
Admission: EM | Admit: 2016-03-03 | Discharge: 2016-03-04 | Disposition: A | Discharge status: Home / Self Care | Payer: Commercial Managed Care - HMO | Attending: Emergency Medicine | Admitting: Emergency Medicine

## 2016-03-03 DIAGNOSIS — K5730 Diverticulosis of large intestine without perforation or abscess without bleeding: Secondary | ICD-10-CM | POA: Diagnosis not present

## 2016-03-03 DIAGNOSIS — R109 Unspecified abdominal pain: Secondary | ICD-10-CM | POA: Diagnosis not present

## 2016-03-03 DIAGNOSIS — I10 Essential (primary) hypertension: Secondary | ICD-10-CM | POA: Insufficient documentation

## 2016-03-03 DIAGNOSIS — I480 Paroxysmal atrial fibrillation: Secondary | ICD-10-CM | POA: Diagnosis not present

## 2016-03-03 DIAGNOSIS — Z79899 Other long term (current) drug therapy: Secondary | ICD-10-CM | POA: Insufficient documentation

## 2016-03-03 DIAGNOSIS — N390 Urinary tract infection, site not specified: Secondary | ICD-10-CM | POA: Insufficient documentation

## 2016-03-03 DIAGNOSIS — R112 Nausea with vomiting, unspecified: Secondary | ICD-10-CM | POA: Diagnosis not present

## 2016-03-03 DIAGNOSIS — Z7901 Long term (current) use of anticoagulants: Secondary | ICD-10-CM | POA: Insufficient documentation

## 2016-03-03 DIAGNOSIS — E039 Hypothyroidism, unspecified: Secondary | ICD-10-CM | POA: Diagnosis not present

## 2016-03-03 DIAGNOSIS — K2970 Gastritis, unspecified, without bleeding: Secondary | ICD-10-CM | POA: Diagnosis not present

## 2016-03-03 LAB — CBC WITH DIFFERENTIAL/PLATELET
BASOS ABS: 0.1 10*3/uL (ref 0.0–0.1)
BASOS PCT: 0 %
EOS ABS: 0.1 10*3/uL (ref 0.0–0.7)
Eosinophils Relative: 0 %
HEMATOCRIT: 48.6 % — AB (ref 36.0–46.0)
HEMOGLOBIN: 15.4 g/dL — AB (ref 12.0–15.0)
Lymphocytes Relative: 16 %
Lymphs Abs: 1.9 10*3/uL (ref 0.7–4.0)
MCH: 29.7 pg (ref 26.0–34.0)
MCHC: 31.7 g/dL (ref 30.0–36.0)
MCV: 93.6 fL (ref 78.0–100.0)
Monocytes Absolute: 1.3 10*3/uL — ABNORMAL HIGH (ref 0.1–1.0)
Monocytes Relative: 12 %
NEUTROS ABS: 8 10*3/uL — AB (ref 1.7–7.7)
NEUTROS PCT: 72 %
Platelets: 206 10*3/uL (ref 150–400)
RBC: 5.19 MIL/uL — ABNORMAL HIGH (ref 3.87–5.11)
RDW: 13.7 % (ref 11.5–15.5)
WBC: 11.3 10*3/uL — ABNORMAL HIGH (ref 4.0–10.5)

## 2016-03-03 LAB — URINALYSIS, ROUTINE W REFLEX MICROSCOPIC
Bilirubin Urine: NEGATIVE
Glucose, UA: NEGATIVE mg/dL
KETONES UR: 15 mg/dL — AB
NITRITE: NEGATIVE
PH: 6 (ref 5.0–8.0)
Protein, ur: NEGATIVE mg/dL
SPECIFIC GRAVITY, URINE: 1.015 (ref 1.005–1.030)

## 2016-03-03 LAB — URINE MICROSCOPIC-ADD ON

## 2016-03-03 LAB — BASIC METABOLIC PANEL
ANION GAP: 9 (ref 5–15)
BUN: 20 mg/dL (ref 6–20)
CALCIUM: 9.9 mg/dL (ref 8.9–10.3)
CO2: 24 mmol/L (ref 22–32)
Chloride: 103 mmol/L (ref 101–111)
Creatinine, Ser: 1.07 mg/dL — ABNORMAL HIGH (ref 0.44–1.00)
GFR, EST AFRICAN AMERICAN: 57 mL/min — AB (ref >60)
GFR, EST NON AFRICAN AMERICAN: 49 mL/min — AB (ref >60)
Glucose, Bld: 95 mg/dL (ref 65–99)
Potassium: 4.5 mmol/L (ref 3.5–5.1)
SODIUM: 136 mmol/L (ref 135–145)

## 2016-03-03 LAB — AMMONIA: AMMONIA: 13 umol/L (ref 9–35)

## 2016-03-03 MED ORDER — CEPHALEXIN 500 MG PO CAPS: 500.0000 mg | ORAL_CAPSULE | Freq: Four times a day (QID) | ORAL | 0 refills | Status: DC

## 2016-03-03 MED ORDER — SODIUM CHLORIDE 0.9 % IV SOLN
INTRAVENOUS | Status: DC
  Administered 2016-03-03: 21:00:00 via INTRAVENOUS

## 2016-03-03 MED ORDER — IOPAMIDOL (ISOVUE-300) INJECTION 61%
INTRAVENOUS | Status: AC
  Administered 2016-03-03: 18:00:00
  Filled 2016-03-03: qty 100

## 2016-03-03 MED ORDER — ACETAMINOPHEN 325 MG PO TABS
650.0000 mg | ORAL_TABLET | Freq: Once | ORAL | Status: AC
  Administered 2016-03-03: 650 mg via ORAL
  Filled 2016-03-03: qty 2

## 2016-03-03 MED ORDER — CEPHALEXIN 250 MG PO CAPS
500.0000 mg | ORAL_CAPSULE | Freq: Once | ORAL | Status: AC
  Administered 2016-03-03: 500 mg via ORAL
  Filled 2016-03-03: qty 2

## 2016-03-03 NOTE — ED Triage Notes (Signed)
Per EMS: pt from home c/o diarrhea since having gall bladder out a year ago and some vomiting; pt poor historian; CBG 130

## 2016-03-03 NOTE — ED Notes (Signed)
Pt in & out cathed by tech and RN.  Pt and bed changed and dried.

## 2016-03-03 NOTE — ED Provider Notes (Signed)
[facility]-EMERGENCY DEPT Provider Note   CSN: [id number] Arrival date & time: 03/03/16  1557     History   Chief Complaint Chief Complaint  Patient presents with  . Emesis  . Diarrhea    HPI Lisa Haynes is a 75 y.o. female.  She presents for somewhat vague symptoms, including abdominal pain, decreased appetite, and diarrhea. Patient is a very poor historian. EMS transferred the patient, from her home, where her son was there as well. Her son apparently called EMS, but he was unable to give any specific reasons for the patient being in distress.   Level V caveat- altered mental status  HPI  Past Medical History:  Diagnosis Date  . Bilateral knee pain   . Depression   . GERD (gastroesophageal reflux disease)   . High cholesterol   . Hypertension   . Low Haynes pain   . Osteoarthritis    "legs" (11/11/2015)  . Osteoporosis   . Paroxysmal atrial fibrillation (HCC) 08/20/2014  . Pneumonia    hx of PNA 2013  . Urinary incontinence     Patient Active Problem List   Diagnosis Date Noted  . Atrial flutter (HCC) 11/20/2015  . Paroxysmal supraventricular tachycardia (HCC)   . Chronic diarrhea 11/11/2015  . Chest pain 11/11/2015  . Paroxysmal atrial fibrillation (HCC) 09/25/2015  . Falls 08/05/2015  . Elevated alanine aminotransferase (ALT) level 06/20/2014  . S/P laparoscopic cholecystectomy 06/20/2014  . Diverticulosis 06/20/2014  . Abnormality of gait 08/05/2012  . Preventative health care 10/15/2010  . Mixed incontinence urge and stress 10/15/2010  . Edema of both legs 08/28/2010  . Anemia 08/28/2010  . Physical deconditioning and falls 08/28/2010  . Hypothyroidism 02/25/2009  . Hyperlipidemia 01/29/2009  . GERD 04/15/2008  . Depression with anxiety 09/01/2007  . Morbid obesity with BMI of 45.0-49.9, adult (HCC) 01/20/2007  . SKIN LESION 10/26/2006  . Essential hypertension 07/13/2006  . KNEE PAIN, BILATERAL 07/13/2006  . LOW Haynes PAIN 07/13/2006  . Osteoporosis  07/13/2006    Past Surgical History:  Procedure Laterality Date  . APPENDECTOMY    . CHOLECYSTECTOMY N/A 10/01/2014   Procedure: LAPAROSCOPIC CHOLECYSTECTOMY ;  Surgeon: Harriette Bouillonhomas Cornett, MD;  Location: [facility] OR;  Service: General;  Laterality: N/A;  . DILATION AND CURETTAGE OF UTERUS     "after I had my boys"  . ERCP N/A 09/27/2014   Procedure: ENDOSCOPIC RETROGRADE CHOLANGIOPANCREATOGRAPHY (ERCP);  Surgeon: Jeani HawkingPatrick Hung, MD;  [facility];  Service: Endoscopy;  Laterality: N/A;  . VAGINAL HYSTERECTOMY     with unilateral oophorectomy    OB History    No data available       Home Medications    Prior to Admission medications   Medication Sig Start Date End Date Taking? Authorizing Provider  alendronate (FOSAMAX) 70 MG tablet Take 1 tablet (70 mg total) by mouth every 7 (seven) days. Take with a full glass of water on an empty stomach. 08/05/15  Yes Denton Brickiana M Truong, MD  Calcium Carbonate-Vitamin D (CALCARB 600/D) 600-400 MG-UNIT per tablet Take 1 tablet by mouth 3 (three) times daily with meals. 06/29/12  Yes Dow Adolphichard Kazibwe, MD  ELIQUIS 5 MG TABS tablet TAKE 1 TABLET BY MOUTH TWICE DAILY Patient taking differently: Take 5 mg by mouth two times a day 01/11/16  Yes Eulah PontNina Blum, MD  lisinopril (PRINIVIL,ZESTRIL) 10 MG tablet TAKE 1 TABLET BY MOUTH DAILY Patient taking differently: Take 10 mg by mouth once a day 06/03/15  Yes Ruben ImJeremy Ford, MD  metoprolol  tartrate (LOPRESSOR) 25 MG tablet TAKE 1 TABLET BY MOUTH TWICE DAILY Patient taking differently: Take 25 mg by mouth two times a day 01/13/16  Yes Eulah Pont, MD  Multiple Vitamins-Minerals (MULTIVITAMIN WITH MINERALS) tablet Take 1 tablet by mouth daily.   Yes Historical Provider, MD  nitroGLYCERIN (NITROSTAT) 0.4 MG SL tablet PLACE 1 TABLET UNDER THE TONGUE EVERY 5 MINUTES AS NEEDED FOR CHEST PAIN 11/13/15  Yes Tyson Alias, MD  omeprazole (PRILOSEC) 20 MG capsule TAKE 1 CAPSULE(20 MG) BY MOUTH DAILY Patient taking differently:  Take 20 mg by mouth daily.  11/20/15  Yes Deneise Lever, MD  sertraline (ZOLOFT) 50 MG tablet TAKE 1/2 TABLET BY MOUTH AT BEDTIME Patient taking differently: Take 25 mg by mouth at bedtime 04/29/15  Yes Ruben Im, MD  simvastatin (ZOCOR) 10 MG tablet TAKE 1 TABLET BY MOUTH EVERY DAY Patient taking differently: Take 10 mg by mouth once a day 02/16/16  Yes Eulah Pont, MD  acetaminophen (TYLENOL) 325 MG tablet Take 2 tablets (650 mg total) by mouth every 6 (six) hours as needed for pain. 06/29/12   Dow Adolph, MD  cephALEXin (KEFLEX) 500 MG capsule Take 1 capsule (500 mg total) by mouth 4 (four) times daily. 03/03/16   Mancel Bale, MD  cephALEXin (KEFLEX) 500 MG capsule Take 1 capsule (500 mg total) by mouth 4 (four) times daily. 03/03/16   Mancel Bale, MD    Family History Family History  Problem Relation Age of Onset  . Heart disease Mother     onset 65s  . Heart disease Brother 45    Social History Social History  Substance Use Topics  . Smoking status: Never Smoker  . Smokeless tobacco: Never Used  . Alcohol use No     Allergies   Morphine and related   Review of Systems Review of Systems  Unable to perform ROS: Mental status change     Physical Exam Updated Vital Signs BP 154/97   Pulse 81   Temp 97.9 F (36.6 C) (Oral)   Resp 17   SpO2 98%   Physical Exam  Constitutional: She appears well-developed.  Obese, elderly  HENT:  Head: Normocephalic and atraumatic.  Eyes: Conjunctivae and EOM are normal. Pupils are equal, round, and reactive to light.  Neck: Normal range of motion and phonation normal. Neck supple.  Cardiovascular: Normal rate and regular rhythm.   Pulmonary/Chest: Effort normal and breath sounds normal. She exhibits no tenderness.  Abdominal: Soft. She exhibits no distension. There is tenderness (Diffuse, moderate). There is no guarding.  Musculoskeletal: Normal range of motion.  Mild tenderness left lower leg without deformity, or skin  abnormality.  Neurological: She is alert. No cranial nerve deficit. She exhibits normal muscle tone. Coordination normal.  Oriented to person and place. Not oriented to time. Poor historian. No dysarthria or aphasia.  Skin: Skin is warm and dry. No rash noted. No erythema.  Psychiatric: She has a normal mood and affect. Her behavior is normal.  Nursing note and vitals reviewed.    ED Treatments / Results  Labs (all labs ordered are listed, but only abnormal results are displayed) Labs Reviewed  BASIC METABOLIC PANEL - Abnormal; Notable for the following:       Result Value   Creatinine, Ser 1.07 (*)    GFR calc non Af Amer 49 (*)    GFR calc Af Amer 57 (*)    All other components within normal limits  CBC WITH DIFFERENTIAL/PLATELET - Abnormal; Notable  for the following:    WBC 11.3 (*)    RBC 5.19 (*)    Hemoglobin 15.4 (*)    HCT 48.6 (*)    Neutro Abs 8.0 (*)    Monocytes Absolute 1.3 (*)    All other components within normal limits  URINALYSIS, ROUTINE W REFLEX MICROSCOPIC (NOT AT [facility]) - Abnormal; Notable for the following:    Hgb urine dipstick SMALL (*)    Ketones, ur 15 (*)    Leukocytes, UA SMALL (*)    All other components within normal limits  URINE MICROSCOPIC-ADD ON - Abnormal; Notable for the following:    Squamous Epithelial / LPF 0-5 (*)    Bacteria, UA FEW (*)    All other components within normal limits  AMMONIA    EKG  EKG Interpretation  Date/Time:  Wednesday March 03 2016 16:12:21 EDT Ventricular Rate:  78 PR Interval:    QRS Duration: 76 QT Interval:  360 QTC Calculation: 410 R Axis:   -4 Text Interpretation:  Sinus rhythm Low voltage, precordial leads Since last tracing rate faster Confirmed by Effie Shy  MD, Falicity Sheets [phone]) on 03/03/2016 4:34:32 PM       Radiology Dg Chest 2 View  Result Date: 03/03/2016 CLINICAL DATA:  Abdominal pain EXAM: CHEST  2 VIEW COMPARISON:  06/20/2014 chest radiograph. FINDINGS: Stable cardiomediastinal  silhouette with normal heart size. No pneumothorax. No pleural effusion. No pulmonary edema. No acute consolidative airspace disease. Mild scarring versus atelectasis at the left lung base. IMPRESSION: Mild scarring versus atelectasis at the left lung base. Otherwise no active disease in the chest. Electronically Signed   By: Delbert Phenix M.D.   On: 03/03/2016 17:54   Ct Abdomen Pelvis W Contrast  Result Date: 03/03/2016 CLINICAL DATA:  Vomiting and diarrhea for approximately 1 year. EXAM: CT ABDOMEN AND PELVIS WITH CONTRAST TECHNIQUE: Multidetector CT imaging of the abdomen and pelvis was performed using the standard protocol following bolus administration of intravenous contrast. CONTRAST:  100 mL ISOVUE-300 IOPAMIDOL (ISOVUE-300) INJECTION 61% COMPARISON:  09/27/2014 FINDINGS: Lower Chest: No acute findings. Hepatobiliary: 1.4 cm low-attenuation lesion in segment 2 of the left hepatic lobe is stable, consistent with benign etiology such as a cyst. No other liver lesions identified. Prior cholecystectomy noted. No evidence of biliary dilatation. Pancreas:  No mass or inflammatory changes. Spleen: Within normal limits in size and appearance. Adrenals/Urinary Tract: No masses identified. Probable tiny cyst in upper pole of left kidney. No evidence of hydronephrosis. Stomach/Bowel: No evidence of obstruction, inflammatory process or abnormal fluid collections. Diverticulosis seen mainly involving the sigmoid colon. No evidence of diverticulitis. Vascular/Lymphatic: No pathologically enlarged lymph nodes. No abdominal aortic aneurysm. Aortic atherosclerosis. Reproductive: Prior hysterectomy noted. Adnexal regions are unremarkable in appearance. Other:  None. Musculoskeletal:  No suspicious bone lesions identified. IMPRESSION: No acute findings within the abdomen or pelvis. Colonic diverticulosis. No radiographic evidence of diverticulitis. Aortic atherosclerosis. Electronically Signed   By: Myles Rosenthal M.D.   On:  03/03/2016 18:55    Procedures Procedures (including critical care time)  Medications Ordered in ED Medications  0.9 %  sodium chloride infusion ( Intravenous New Bag/Given 03/03/16 2045)  iopamidol (ISOVUE-300) 61 % injection (  Contrast Given 03/03/16 1800)  cephALEXin (KEFLEX) capsule 500 mg (500 mg Oral Given 03/03/16 2253)  acetaminophen (TYLENOL) tablet 650 mg (650 mg Oral Given 03/03/16 2253)     Initial Impression / Assessment and Plan / ED Course  I have reviewed the triage vital signs and the  nursing notes.  Pertinent labs & imaging results that were available during my care of the patient were reviewed by me and considered in my medical decision making (see chart for details).  Clinical Course    Medications  0.9 %  sodium chloride infusion ( Intravenous New Bag/Given 03/03/16 2045)  iopamidol (ISOVUE-300) 61 % injection (  Contrast Given 03/03/16 1800)  cephALEXin (KEFLEX) capsule 500 mg (500 mg Oral Given 03/03/16 2253)  acetaminophen (TYLENOL) tablet 650 mg (650 mg Oral Given 03/03/16 2253)    Patient Vitals for the past 24 hrs:  BP Temp Temp src Pulse Resp SpO2  03/03/16 2245 154/97 - - 81 17 98 %  03/03/16 2115 143/89 - - 66 22 99 %  03/03/16 2100 136/86 - - 69 15 95 %  03/03/16 2015 112/99 - - 85 14 97 %  03/03/16 2000 (!) 132/53 - - 83 18 98 %  03/03/16 1611 121/74 97.9 F (36.6 C) Oral 84 18 97 %    At D/C-  Reevaluation with update and discussion. After initial assessment and treatment, an updated evaluation reveals She is feeling better and has been tolerating oral fluids. She has no additional complaints. She understands that she needs to follow-up with her primary care doctor for further evaluation. Haizel Gatchell L    Final Clinical Impressions(s) / ED Diagnoses   Final diagnoses:  Lower urinary tract infectious disease    Malaise, with nonspecific symptoms. Doubt CVA, serious bacterial infection, metabolic instability or impending vascular  collapse.  Nursing Notes Reviewed/ Care Coordinated Applicable Imaging Reviewed Interpretation of Laboratory Data incorporated into ED treatment  The patient appears reasonably screened and/or stabilized for discharge and I doubt any other medical condition or other [facility] requiring further screening, evaluation, or treatment in the ED at this time prior to discharge.  Plan: Home Medications- continue; Home Treatments- rest; return here if the recommended treatment, does not improve the symptoms; Recommended follow up- PCP prn and 1 week for check up.   New Prescriptions New Prescriptions   CEPHALEXIN (KEFLEX) 500 MG CAPSULE    Take 1 capsule (500 mg total) by mouth 4 (four) times daily.   CEPHALEXIN (KEFLEX) 500 MG CAPSULE    Take 1 capsule (500 mg total) by mouth 4 (four) times daily.     Mancel BaleElliott Anesa Fronek, MD 03/03/16 2350

## 2016-03-03 NOTE — Discharge Instructions (Signed)
Start taking the antibiotic prescription, tomorrow morning.  Get plenty of rest, drink a lot of fluids, and eat 3 regular meals each day.  It is important to follow up with your primary care doctor for an appointment next week. Call in the morning to schedule an appointment.

## 2016-03-03 NOTE — ED Notes (Signed)
Patient undressed, in gown, on monitor, continuous pulse oximetry and blood pressure cuff 

## 2016-03-04 DIAGNOSIS — K591 Functional diarrhea: Secondary | ICD-10-CM | POA: Diagnosis not present

## 2016-03-04 DIAGNOSIS — I480 Paroxysmal atrial fibrillation: Secondary | ICD-10-CM | POA: Diagnosis not present

## 2016-03-04 DIAGNOSIS — R112 Nausea with vomiting, unspecified: Secondary | ICD-10-CM | POA: Diagnosis not present

## 2016-03-05 DIAGNOSIS — I480 Paroxysmal atrial fibrillation: Secondary | ICD-10-CM | POA: Diagnosis not present

## 2016-03-08 DIAGNOSIS — I480 Paroxysmal atrial fibrillation: Secondary | ICD-10-CM | POA: Diagnosis not present

## 2016-03-10 ENCOUNTER — Encounter: Payer: Self-pay | Admitting: Internal Medicine

## 2016-03-10 ENCOUNTER — Ambulatory Visit (INDEPENDENT_AMBULATORY_CARE_PROVIDER_SITE_OTHER): Payer: Commercial Managed Care - HMO | Admitting: Internal Medicine

## 2016-03-10 VITALS — BP 122/66 | HR 85 | Temp 97.9°F | Ht 60.0 in | Wt 210.6 lb

## 2016-03-10 DIAGNOSIS — I480 Paroxysmal atrial fibrillation: Secondary | ICD-10-CM | POA: Diagnosis not present

## 2016-03-10 DIAGNOSIS — Z7901 Long term (current) use of anticoagulants: Secondary | ICD-10-CM

## 2016-03-10 DIAGNOSIS — H0289 Other specified disorders of eyelid: Secondary | ICD-10-CM | POA: Diagnosis not present

## 2016-03-10 DIAGNOSIS — Z23 Encounter for immunization: Secondary | ICD-10-CM | POA: Diagnosis not present

## 2016-03-10 DIAGNOSIS — H25813 Combined forms of age-related cataract, bilateral: Secondary | ICD-10-CM | POA: Diagnosis not present

## 2016-03-10 DIAGNOSIS — Z0000 Encounter for general adult medical examination without abnormal findings: Secondary | ICD-10-CM

## 2016-03-10 DIAGNOSIS — H539 Unspecified visual disturbance: Secondary | ICD-10-CM | POA: Diagnosis not present

## 2016-03-10 DIAGNOSIS — I10 Essential (primary) hypertension: Secondary | ICD-10-CM

## 2016-03-10 DIAGNOSIS — Z79899 Other long term (current) drug therapy: Secondary | ICD-10-CM

## 2016-03-10 DIAGNOSIS — F418 Other specified anxiety disorders: Secondary | ICD-10-CM | POA: Diagnosis not present

## 2016-03-10 MED ORDER — APIXABAN 5 MG PO TABS: 5.0000 mg | ORAL_TABLET | Freq: Two times a day (BID) | ORAL | 1 refills | Status: DC

## 2016-03-10 MED ORDER — NITROGLYCERIN 0.4 MG SL SUBL: SUBLINGUAL_TABLET | SUBLINGUAL | 0 refills | Status: DC

## 2016-03-10 MED ORDER — CALCIUM CARBONATE-VITAMIN D 600-400 MG-UNIT PO TABS: 1.0000 | ORAL_TABLET | Freq: Three times a day (TID) | ORAL | 2 refills | Status: DC

## 2016-03-10 MED ORDER — LISINOPRIL 10 MG PO TABS: 10.0000 mg | ORAL_TABLET | Freq: Every day | ORAL | 3 refills | Status: DC

## 2016-03-10 MED ORDER — SERTRALINE HCL 50 MG PO TABS: 50.0000 mg | ORAL_TABLET | Freq: Every day | ORAL | 0 refills | Status: DC

## 2016-03-10 NOTE — Progress Notes (Signed)
   CC: anxiety and medication refills   HPI: [name] is a 75 y.o. with past medical history as outlined below who presents to [facility] for follow up of paroxymal atrial fibrillation, hypertension, anxiety and hyperlipidemia.   Please see problem list for status of the pt's chronic medical problems.  Past Medical History:  Diagnosis Date  . Bilateral knee pain   . Depression   . GERD (gastroesophageal reflux disease)   . High cholesterol   . Hypertension   . Low back pain   . Osteoarthritis    "legs" (11/11/2015)  . Osteoporosis   . Paroxysmal atrial fibrillation (HCC) 08/20/2014  . Pneumonia    hx of PNA 2013  . Urinary incontinence    Review of Systems:  Review of Systems  Cardiovascular: Positive for leg swelling. Negative for chest pain and palpitations.  Gastrointestinal: Negative for abdominal pain.  Genitourinary: Negative for dysuria.  Neurological: Negative for loss of consciousness.   Physical Exam:  Vitals:   03/10/16 1503  BP: 122/66  Pulse: 85  Temp: 97.9 F (36.6 C)  TempSrc: Oral  SpO2: 100%  Weight: 210 lb 9.6 oz (95.5 kg)  Height: 5' (1.524 m)   Physical Exam  Cardiovascular: Normal rate and regular rhythm.   No murmur heard. Pulmonary/Chest: Effort normal. She has no wheezes. She has no rales.  Abdominal: Soft. She exhibits no distension. There is no tenderness.  Musculoskeletal: She exhibits edema.  Bilateral ankle edema     Assessment & Plan:   See Encounters Tab for problem based charting.   Patient seen with Dr. Oswaldo DoneVincent

## 2016-03-10 NOTE — Assessment & Plan Note (Signed)
BP well controlled today 122/66   Refilled lisinopril 10 mg daily

## 2016-03-10 NOTE — Assessment & Plan Note (Addendum)
She has been on zoloft 25 mg daily but reports continued generalized anxiety. She experiences episodes of anxiety associated with chest pain and diaphoresis. She scored 17 on GAD - 7 screen today   Increased zoloft 25 mg qHs to 50 mg qHS

## 2016-03-10 NOTE — Assessment & Plan Note (Signed)
Gave influenza vaccine today   She will need PCV 13 at next visit  She will need Zostavax at some point

## 2016-03-10 NOTE — Patient Instructions (Addendum)
It was a pleasure to see you again today Lisa Haynes!   For your anxiety - start taking sertraline (zoloft) 50 mg as a whole pill at bedtime (before you were taking half of a pill)  This medication can make you more tired  Today you will get your flu shot   Schedule a follow up appointment to see me in 1 month  If you have any new concerns please don't hesitate to call us

## 2016-03-10 NOTE — Assessment & Plan Note (Addendum)
She has been seen by Dr. Johney FrameAllred who suggested that she continue taking metoprolol and has no indication for pacing yet.Denies any chest pain, palpitations, shortness of breath, or edema. Today she is in normal sinus rhythm with rate 85. She has episodes of anxiety, chest pain and diaphoresis at times but relates these to her anxiety and people around her making her feel worked up. CHADSVASC 4 (HTN, Female, >75)   Refilled metoprolol 25 mg BID for rate control  Refilled Eliquis 5 mg BID

## 2016-03-11 ENCOUNTER — Other Ambulatory Visit: Payer: Self-pay | Admitting: Internal Medicine

## 2016-03-11 ENCOUNTER — Other Ambulatory Visit: Payer: Self-pay

## 2016-03-11 DIAGNOSIS — I480 Paroxysmal atrial fibrillation: Secondary | ICD-10-CM | POA: Diagnosis not present

## 2016-03-11 NOTE — Telephone Encounter (Signed)
simvastatin (ZOCOR) 10 MG table, refill request @ [facility] on [address].

## 2016-03-11 NOTE — Progress Notes (Signed)
Internal Medicine Clinic Attending  I saw and evaluated the patient.  I personally confirmed the key portions of the history and exam documented by Dr. Blum and I reviewed pertinent patient test results.  The assessment, diagnosis, and plan were formulated together and I agree with the documentation in the resident's note. 

## 2016-03-12 DIAGNOSIS — I480 Paroxysmal atrial fibrillation: Secondary | ICD-10-CM | POA: Diagnosis not present

## 2016-03-15 DIAGNOSIS — I480 Paroxysmal atrial fibrillation: Secondary | ICD-10-CM | POA: Diagnosis not present

## 2016-03-17 DIAGNOSIS — I480 Paroxysmal atrial fibrillation: Secondary | ICD-10-CM | POA: Diagnosis not present

## 2016-03-18 DIAGNOSIS — I480 Paroxysmal atrial fibrillation: Secondary | ICD-10-CM | POA: Diagnosis not present

## 2016-03-19 DIAGNOSIS — I480 Paroxysmal atrial fibrillation: Secondary | ICD-10-CM | POA: Diagnosis not present

## 2016-03-22 DIAGNOSIS — I480 Paroxysmal atrial fibrillation: Secondary | ICD-10-CM | POA: Diagnosis not present

## 2016-03-23 ENCOUNTER — Telehealth: Payer: Self-pay | Admitting: *Deleted

## 2016-03-23 ENCOUNTER — Other Ambulatory Visit: Payer: Self-pay | Admitting: Internal Medicine

## 2016-03-23 MED ORDER — CHOLESTYRAMINE 4 G PO PACK: 1.0000 | PACK | Freq: Every day | ORAL | 0 refills | Status: DC

## 2016-03-23 MED ORDER — CHOLESTYRAMINE 4 G PO PACK: 1.0000 | PACK | Freq: Two times a day (BID) | ORAL | 0 refills | Status: DC

## 2016-03-23 MED FILL — CHOLESTYRAMINE PACKET: 4 | 30 days supply | Qty: 30 | Fill #0

## 2016-03-23 NOTE — Telephone Encounter (Signed)
Drinda Buttsnnette, caregiver, calls to state pt is having diarrhea appr 3 times daily, she states it is massive amounts, she cannot keep pt clean, it causes a lot of dirty laundry even though pt uses depends, "it is not good for her to get it on her skin either" could you please call something in, annette states she ask the dr at last visit and dr said she would but must have forgotten [phone]

## 2016-03-23 NOTE — Telephone Encounter (Signed)
Spoke with Drinda Buttsnnette, she states this is the same diarrhea that [name] has had since her office visit in July when she was admitted for sinus tach. States that Ms. Trabert is having soft brown stools which are not watery and they do not see any blood or mucous. The stools do not have any abnormal smell. 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  Sent in cholestyramine 4 g daily to [facility], this can be increased by 4 g at weekly intervals with maximum dose 36 g/day.  Drinda Buttsnnette will call us if Ms. Manzella develops fever or any new concerning symptoms.

## 2016-03-24 DIAGNOSIS — I480 Paroxysmal atrial fibrillation: Secondary | ICD-10-CM | POA: Diagnosis not present

## 2016-03-25 DIAGNOSIS — I480 Paroxysmal atrial fibrillation: Secondary | ICD-10-CM | POA: Diagnosis not present

## 2016-03-26 DIAGNOSIS — I480 Paroxysmal atrial fibrillation: Secondary | ICD-10-CM | POA: Diagnosis not present

## 2016-03-29 DIAGNOSIS — I480 Paroxysmal atrial fibrillation: Secondary | ICD-10-CM | POA: Diagnosis not present

## 2016-03-31 DIAGNOSIS — I480 Paroxysmal atrial fibrillation: Secondary | ICD-10-CM | POA: Diagnosis not present

## 2016-03-31 NOTE — Telephone Encounter (Signed)
Patient was able to pick [facility] from [facility]  Thank you!

## 2016-04-02 DIAGNOSIS — I480 Paroxysmal atrial fibrillation: Secondary | ICD-10-CM | POA: Diagnosis not present

## 2016-04-05 DIAGNOSIS — I480 Paroxysmal atrial fibrillation: Secondary | ICD-10-CM | POA: Diagnosis not present

## 2016-04-07 ENCOUNTER — Other Ambulatory Visit: Payer: Self-pay | Admitting: Internal Medicine

## 2016-04-07 DIAGNOSIS — K219 Gastro-esophageal reflux disease without esophagitis: Secondary | ICD-10-CM

## 2016-04-07 DIAGNOSIS — I480 Paroxysmal atrial fibrillation: Secondary | ICD-10-CM | POA: Diagnosis not present

## 2016-04-08 DIAGNOSIS — I480 Paroxysmal atrial fibrillation: Secondary | ICD-10-CM | POA: Diagnosis not present

## 2016-04-09 DIAGNOSIS — I480 Paroxysmal atrial fibrillation: Secondary | ICD-10-CM | POA: Diagnosis not present

## 2016-04-12 DIAGNOSIS — I480 Paroxysmal atrial fibrillation: Secondary | ICD-10-CM | POA: Diagnosis not present

## 2016-04-14 DIAGNOSIS — I480 Paroxysmal atrial fibrillation: Secondary | ICD-10-CM | POA: Diagnosis not present

## 2016-04-15 DIAGNOSIS — I480 Paroxysmal atrial fibrillation: Secondary | ICD-10-CM | POA: Diagnosis not present

## 2016-04-16 DIAGNOSIS — I480 Paroxysmal atrial fibrillation: Secondary | ICD-10-CM | POA: Diagnosis not present

## 2016-04-19 DIAGNOSIS — I480 Paroxysmal atrial fibrillation: Secondary | ICD-10-CM | POA: Diagnosis not present

## 2016-04-21 DIAGNOSIS — I480 Paroxysmal atrial fibrillation: Secondary | ICD-10-CM | POA: Diagnosis not present

## 2016-04-22 DIAGNOSIS — I480 Paroxysmal atrial fibrillation: Secondary | ICD-10-CM | POA: Diagnosis not present

## 2016-04-23 DIAGNOSIS — I480 Paroxysmal atrial fibrillation: Secondary | ICD-10-CM | POA: Diagnosis not present

## 2016-04-23 DIAGNOSIS — H43813 Vitreous degeneration, bilateral: Secondary | ICD-10-CM | POA: Diagnosis not present

## 2016-04-23 DIAGNOSIS — H353134 Nonexudative age-related macular degeneration, bilateral, advanced atrophic with subfoveal involvement: Secondary | ICD-10-CM | POA: Diagnosis not present

## 2016-04-26 DIAGNOSIS — I480 Paroxysmal atrial fibrillation: Secondary | ICD-10-CM | POA: Diagnosis not present

## 2016-04-28 DIAGNOSIS — I480 Paroxysmal atrial fibrillation: Secondary | ICD-10-CM | POA: Diagnosis not present

## 2016-04-29 DIAGNOSIS — I480 Paroxysmal atrial fibrillation: Secondary | ICD-10-CM | POA: Diagnosis not present

## 2016-04-30 DIAGNOSIS — I480 Paroxysmal atrial fibrillation: Secondary | ICD-10-CM | POA: Diagnosis not present

## 2016-05-03 DIAGNOSIS — I480 Paroxysmal atrial fibrillation: Secondary | ICD-10-CM | POA: Diagnosis not present

## 2016-05-04 DIAGNOSIS — I480 Paroxysmal atrial fibrillation: Secondary | ICD-10-CM | POA: Diagnosis not present

## 2016-05-05 DIAGNOSIS — I480 Paroxysmal atrial fibrillation: Secondary | ICD-10-CM | POA: Diagnosis not present

## 2016-05-06 DIAGNOSIS — I480 Paroxysmal atrial fibrillation: Secondary | ICD-10-CM | POA: Diagnosis not present

## 2016-05-07 DIAGNOSIS — I480 Paroxysmal atrial fibrillation: Secondary | ICD-10-CM | POA: Diagnosis not present

## 2016-05-10 DIAGNOSIS — I480 Paroxysmal atrial fibrillation: Secondary | ICD-10-CM | POA: Diagnosis not present

## 2016-05-10 IMAGING — CT CT ABD-PELV W/ CM
2 of 5 series · 16 of 46 positions shown, 18 images · IV contrast (omnipaque)
Comparison: 06/20/2014

CLINICAL DATA: Abdominal pain

EXAM:
CT ABDOMEN AND PELVIS WITH CONTRAST
TECHNIQUE: Multidetector CT imaging of the abdomen and pelvis was performed
using the standard protocol following bolus administration of
intravenous contrast.
CONTRAST:  80mL OMNIPAQUE IOHEXOL 300 MG/ML  SOLN

[Series 2: abd/ pelvis 5.0 i30f 1 · axial · 0.76mm/px · z∈[-432,-52]mm · 13 of 86 slices shown, 15 images]
[im 5/86  soft-tissue]
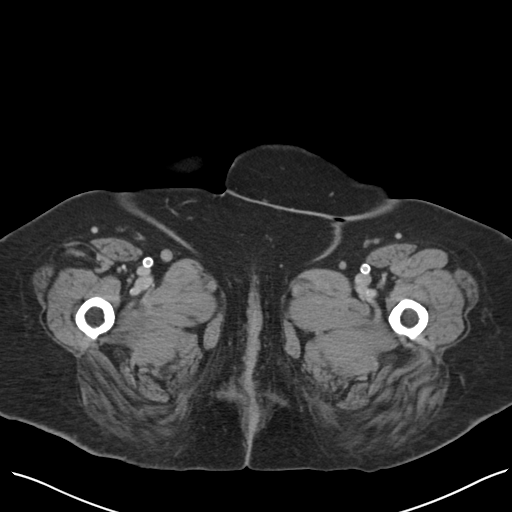
[im 5/86  bone]
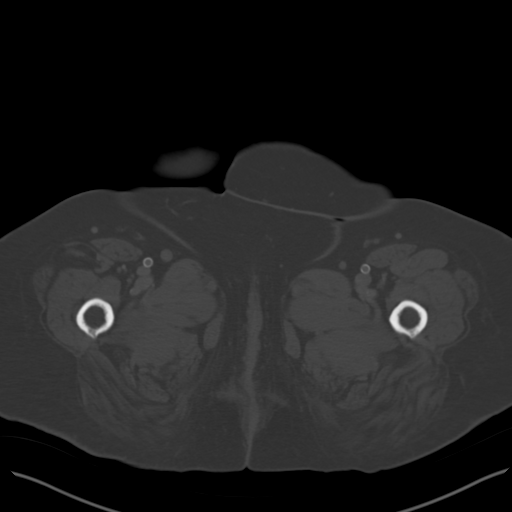
[im 13/86  soft-tissue]
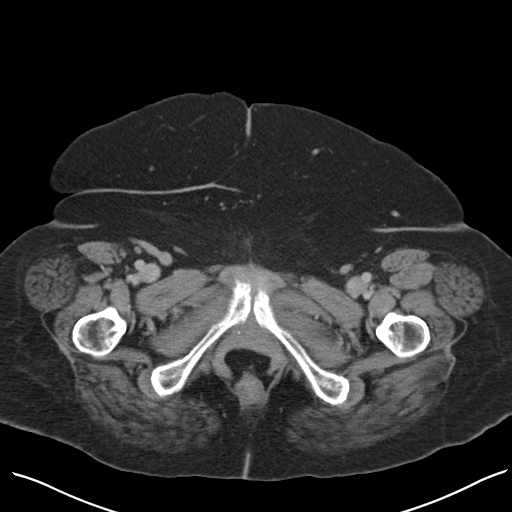
[im 18/86  soft-tissue]
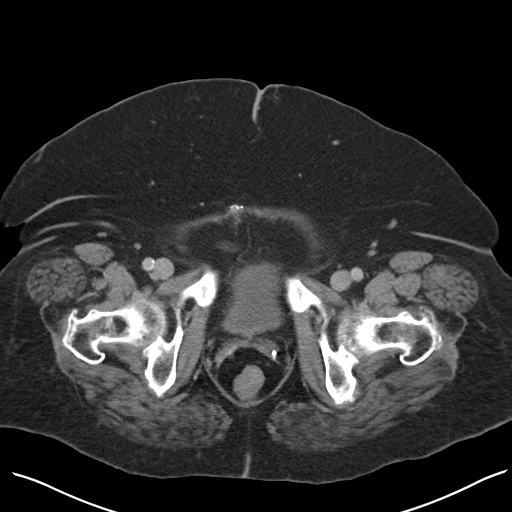
[im 26/86  soft-tissue]
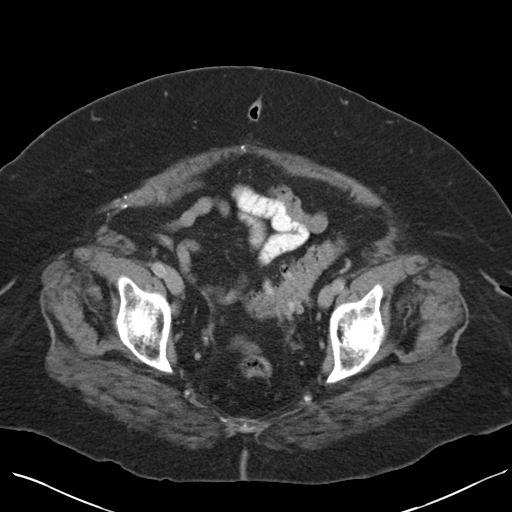
[im 30/86  soft-tissue]
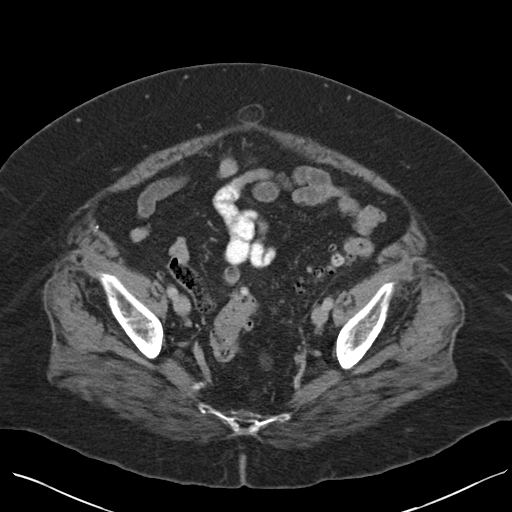
[im 39/86  soft-tissue]
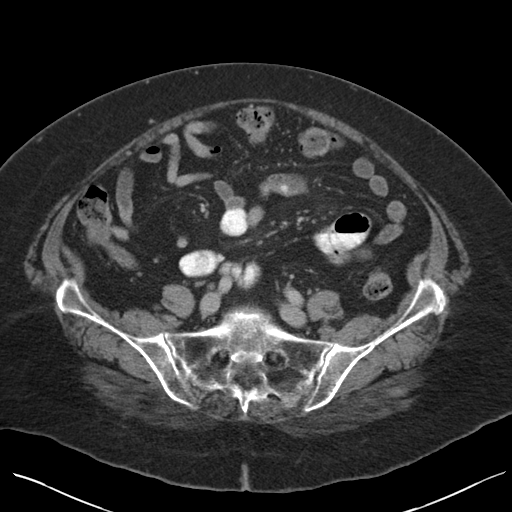
[im 43/86  soft-tissue]
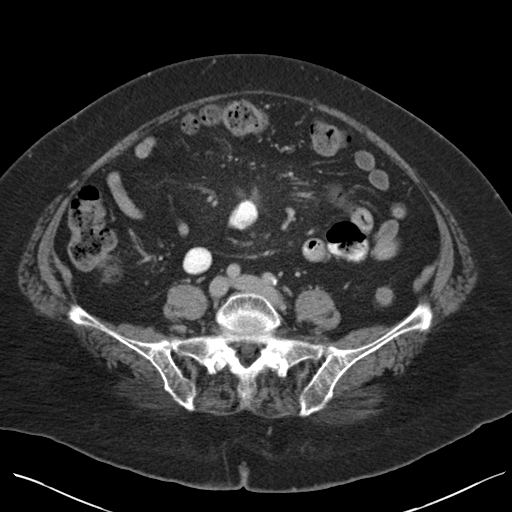
[im 47/86  soft-tissue]
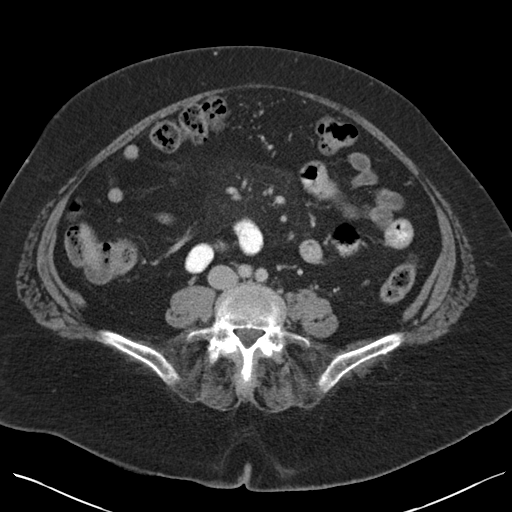
[im 56/86  soft-tissue]
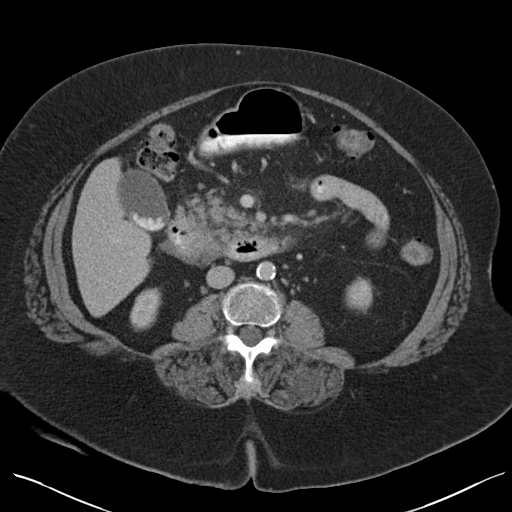
[im 56/86  bone]
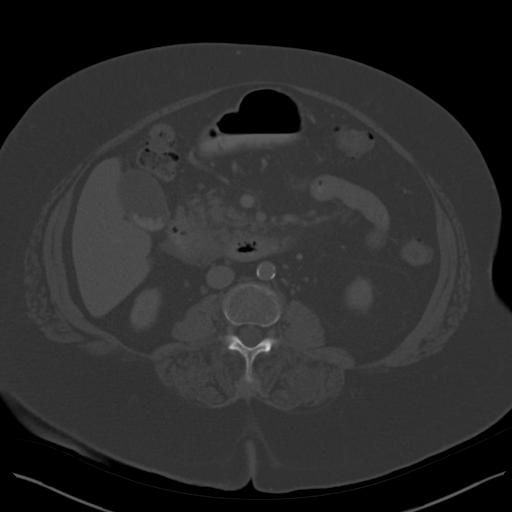
[im 60/86  soft-tissue]
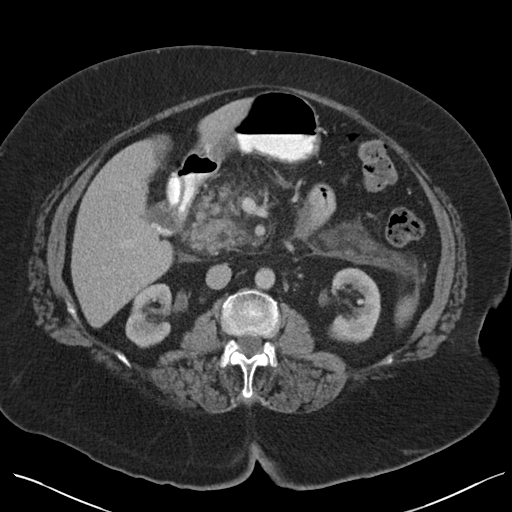
[im 69/86  soft-tissue]
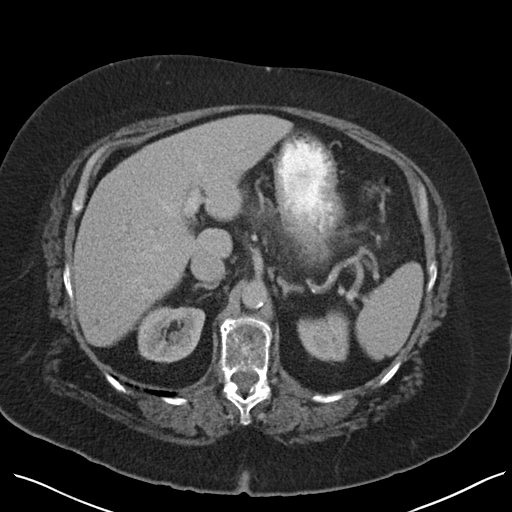
[im 73/86  soft-tissue]
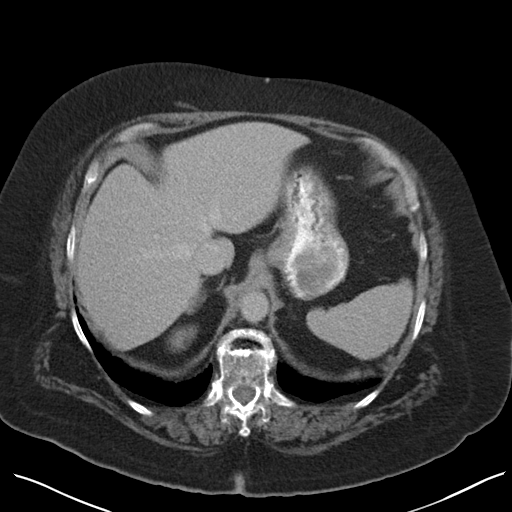
[im 81/86  soft-tissue]
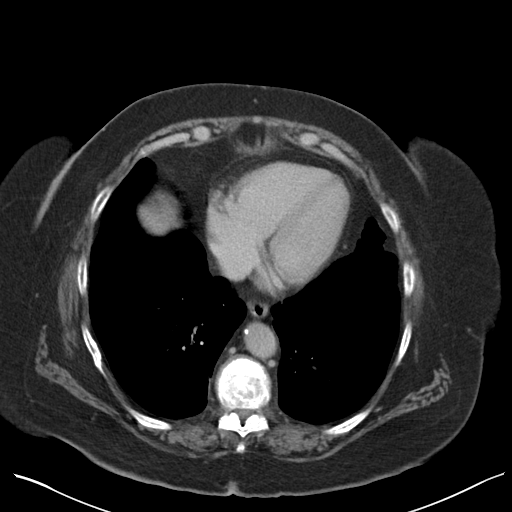

[Series 5: coronals · coronal · 0.80mm/px · 3 of 158 slices shown]
[im 53/158  soft-tissue]
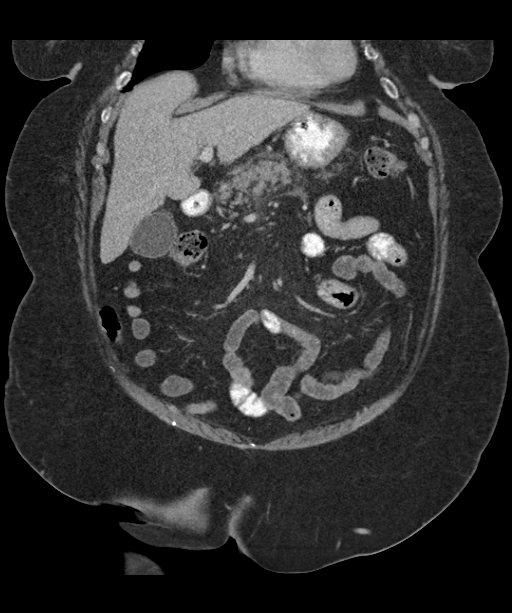
[im 70/158  soft-tissue]
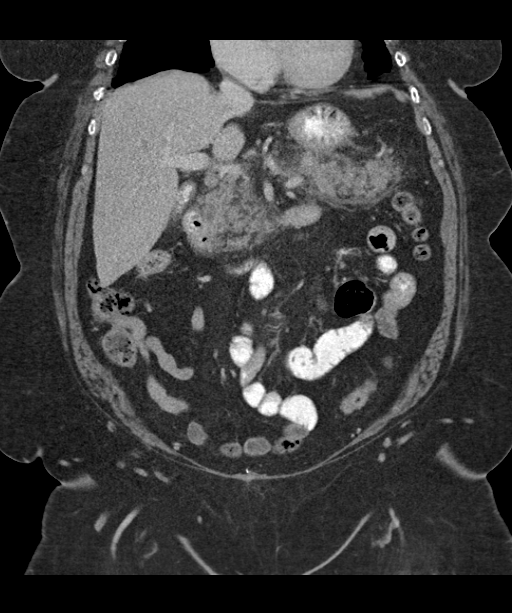
[im 88/158  soft-tissue]
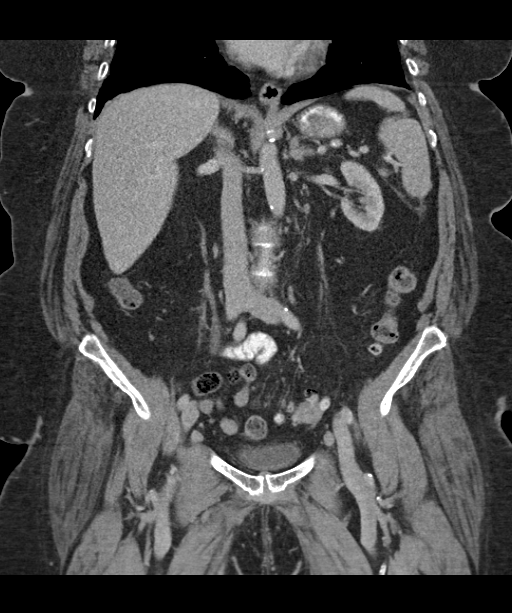

[16 of 46 positions shown; findings below may reference images not displayed]

FINDINGS: BODY WALL: No contributory findings.

LOWER CHEST: No contributory findings.

ABDOMEN/PELVIS:

Liver: 14 mm lesion in the medial left liver has mildly indistinct
margins but is stable since 6888. A tiny low-density in the
posterior right liver on image 18 is also stable and considered
incidental. Possible mild hepatic steatosis.

Biliary: Cholelithiasis. There is a newly seen stone in the proximal
common bile duct. No CBD dilation. No evidence of cholecystitis.

Pancreas: Peripancreatic edema and diffuse pancreatic expansion. No
necrosis or organized collection. No vascular complication.

Spleen: Unremarkable.

Adrenals: Unremarkable.

Kidneys and ureters: No hydronephrosis or stone.

Bladder: Chronic mild bladder wall thickening and indistinctness.

Reproductive: Hysterectomy and oophorectomies. Marked pelvic floor
laxity.

Bowel: No obstruction. The appendix is distended with stool, but
there is no acute inflammatory change. Extensive distal colonic
diverticulosis.

Retroperitoneum: No mass or adenopathy.

Peritoneum: No ascites or pneumoperitoneum.

Vascular: No acute abnormality.

OSSEOUS: No acute abnormalities.
IMPRESSION: 1. Interstitial edematous pancreatitis, likely biliary given
cholelithiasis and choledocholithiasis.
2. Chronic findings are noted above.

## 2016-05-11 DIAGNOSIS — I480 Paroxysmal atrial fibrillation: Secondary | ICD-10-CM | POA: Diagnosis not present

## 2016-05-12 ENCOUNTER — Other Ambulatory Visit: Payer: Self-pay | Admitting: Internal Medicine

## 2016-05-12 ENCOUNTER — Ambulatory Visit (HOSPITAL_COMMUNITY)
Admission: RE | Admit: 2016-05-12 | Discharge: 2016-05-12 | Disposition: A | Discharge status: Home / Self Care | Payer: Commercial Managed Care - HMO | Source: Ambulatory Visit | Attending: Nurse Practitioner | Admitting: Nurse Practitioner

## 2016-05-12 ENCOUNTER — Encounter (HOSPITAL_COMMUNITY): Payer: Self-pay | Admitting: Nurse Practitioner

## 2016-05-12 VITALS — BP 102/58 | HR 54 | Ht 60.0 in | Wt 201.4 lb

## 2016-05-12 DIAGNOSIS — M810 Age-related osteoporosis without current pathological fracture: Secondary | ICD-10-CM | POA: Diagnosis not present

## 2016-05-12 DIAGNOSIS — K219 Gastro-esophageal reflux disease without esophagitis: Secondary | ICD-10-CM | POA: Insufficient documentation

## 2016-05-12 DIAGNOSIS — Z885 Allergy status to narcotic agent status: Secondary | ICD-10-CM | POA: Diagnosis not present

## 2016-05-12 DIAGNOSIS — Z8249 Family history of ischemic heart disease and other diseases of the circulatory system: Secondary | ICD-10-CM | POA: Insufficient documentation

## 2016-05-12 DIAGNOSIS — I495 Sick sinus syndrome: Secondary | ICD-10-CM | POA: Diagnosis not present

## 2016-05-12 DIAGNOSIS — I480 Paroxysmal atrial fibrillation: Secondary | ICD-10-CM

## 2016-05-12 DIAGNOSIS — F329 Major depressive disorder, single episode, unspecified: Secondary | ICD-10-CM | POA: Diagnosis not present

## 2016-05-12 DIAGNOSIS — E7800 Pure hypercholesterolemia, unspecified: Secondary | ICD-10-CM | POA: Diagnosis not present

## 2016-05-12 DIAGNOSIS — Z79899 Other long term (current) drug therapy: Secondary | ICD-10-CM | POA: Diagnosis not present

## 2016-05-12 DIAGNOSIS — I4891 Unspecified atrial fibrillation: Secondary | ICD-10-CM | POA: Diagnosis present

## 2016-05-12 DIAGNOSIS — Z7901 Long term (current) use of anticoagulants: Secondary | ICD-10-CM | POA: Insufficient documentation

## 2016-05-12 DIAGNOSIS — Z7983 Long term (current) use of bisphosphonates: Secondary | ICD-10-CM | POA: Insufficient documentation

## 2016-05-12 DIAGNOSIS — I10 Essential (primary) hypertension: Secondary | ICD-10-CM | POA: Insufficient documentation

## 2016-05-12 NOTE — Progress Notes (Signed)
Primary Care Physician: Eulah Pont, MD Referring Physician: Dr. Paralee Cancel is a 76 y.o. female with a h/o afib/flutter, HTN, anxiety that is in the afib clinic for evaluation of a recent holter showing afib with RVR. She was admitted to  the hospital 7/11-7/13 after finding pt with a HR of 150's in the Internal Medicine clinic. Troponin's were negative and she was found to have a flutter. Stress testing was discussed because pt also had c/o of chest pain but it was deferred. She was originally found to have afib as an isolated incident with GB surgery in Feb 2016. She was started on anticoagulation for CHA2DS2VASc score of 4. She was also d/c on metoprolol 25 mg bid.A holter monitor was placed and she was found to have non sustained PAF with v rates up to 200 bpm, and sinus brady at 40 bpm, seen once at 9am. Dr. Anne Fu referred here to discuss antiarrythmic's, possibly tikosyn.  She is a sedentary, deconditioned female that walks with aid of walker. She is here with a niece. She describes an heaviness in her chest that will radiate to her left arm when she tries to sweep, mop and or walk a long distance. This starts with a heaviness but if she continues, her heart will pound. She can sit down and rest and the symptoms will resolve. That is the only time she is aware of an irregular heartbeat.  Discussed need for stress test with her symptoms and she does not desire to have one at this time. No rest symptoms. She did have a stress myoview in 2016 prior to GB surgery which was low risk. Also discussed antiarrythmic's and she is not interested in starting a new drug or coming into the hospital to receive a drug to keep her in rhythm. At this time, she does not consider the occasional heart pounding to be an big interruption to her lifestyle ans she would prefer to keep things the same for now, unless her symptoms escalate. Her heartbeat is on slow side today but she is not symptomatic with  this.  Returns to afib clinic 1/0/18. She is in SR. She has not noticed any irregular heart beat. No chest pain as she described earlier months ago. Doing well on blood thinners, no signs of bleeding.   Today, she denies symptoms of shortness of breath, orthopnea, PND, lower extremity edema, dizziness, presyncope, syncope, or neurologic sequela. The patient is tolerating medications without difficulties and is otherwise without complaint today.   Past Medical History:  Diagnosis Date  . Bilateral knee pain   . Depression   . GERD (gastroesophageal reflux disease)   . High cholesterol   . Hypertension   . Low back pain   . Osteoarthritis    "legs" (11/11/2015)  . Osteoporosis   . Paroxysmal atrial fibrillation (HCC) 08/20/2014  . Pneumonia    hx of PNA 2013  . Urinary incontinence    Past Surgical History:  Procedure Laterality Date  . APPENDECTOMY    . CHOLECYSTECTOMY N/A 10/01/2014   Procedure: LAPAROSCOPIC CHOLECYSTECTOMY ;  Surgeon: Harriette Bouillon, MD;  Location: [facility] OR;  Service: General;  Laterality: N/A;  . DILATION AND CURETTAGE OF UTERUS     "after I had my boys"  . ERCP N/A 09/27/2014   Procedure: ENDOSCOPIC RETROGRADE CHOLANGIOPANCREATOGRAPHY (ERCP);  Surgeon: Jeani Hawking, MD;  Location[facility] ENDOSCOPY;  Service: Endoscopy;  Laterality: N/A;  . VAGINAL HYSTERECTOMY     with unilateral  oophorectomy    Current Outpatient Prescriptions  Medication Sig Dispense Refill  . acetaminophen (TYLENOL) 325 MG tablet Take 2 tablets (650 mg total) by mouth every 6 (six) hours as needed for pain. 60 tablet 3  . alendronate (FOSAMAX) 70 MG tablet Take 1 tablet (70 mg total) by mouth every 7 (seven) days. Take with a full glass of water on an empty stomach. 4 tablet 11  . apixaban (ELIQUIS) 5 MG TABS tablet Take 1 tablet (5 mg total) by mouth 2 (two) times daily. 60 tablet 1  . Calcium Carbonate-Vitamin D (CALCARB 600/D) 600-400 MG-UNIT tablet Take 1 tablet by mouth 3 (three) times daily  with meals. 30 tablet 2  . lisinopril (PRINIVIL,ZESTRIL) 10 MG tablet Take 1 tablet (10 mg total) by mouth daily. 90 tablet 3  . metoprolol tartrate (LOPRESSOR) 25 MG tablet TAKE 1 TABLET BY MOUTH TWICE DAILY (Patient taking differently: Take 25 mg by mouth two times a day) 180 tablet 1  . Multiple Vitamins-Minerals (MULTIVITAMIN WITH MINERALS) tablet Take 1 tablet by mouth daily.    . nitroGLYCERIN (NITROSTAT) 0.4 MG SL tablet PLACE 1 TABLET UNDER THE TONGUE EVERY 5 MINUTES AS NEEDED FOR CHEST PAIN 90 tablet 0  . omeprazole (PRILOSEC) 20 MG capsule TAKE ONE CAPSULE BY MOUTH DAILY 90 capsule 3  . sertraline (ZOLOFT) 50 MG tablet Take 1 tablet (50 mg total) by mouth at bedtime. 90 tablet 0  . simvastatin (ZOCOR) 10 MG tablet TAKE 1 TABLET BY MOUTH EVERY DAY (Patient taking differently: Take 10 mg by mouth once a day) 90 tablet 0   No current facility-administered medications for this encounter.     Allergies  Allergen Reactions  . Morphine And Related Other (See Comments)    Drives patient "out of" her "mind"    Social History   Social History  . Marital status: Widowed    Spouse name: N/A  . Number of children: N/A  . Years of education: N/A   Occupational History  . Retired    Social History Main Topics  . Smoking status: Never Smoker  . Smokeless tobacco: Never Used  . Alcohol use No  . Drug use: No  . Sexual activity: No   Other Topics Concern  . Not on file   Social History Narrative   Retired: Motel work   Divorced    Regular exercise- no   Lives alone    Family History  Problem Relation Age of Onset  . Heart disease Mother     onset 9280s  . Heart disease Brother 10373    ROS- All systems are reviewed and negative except as per the HPI above  Physical Exam: Vitals:   05/12/16 1356  BP: (!) 102/58  Pulse: (!) 54  Weight: 201 lb 6.4 oz (91.4 kg)  Height: 5' (1.524 m)    GEN- The patient is well appearing, alert and oriented x 3 today.   Head-  normocephalic, atraumatic Eyes-  Sclera clear, conjunctiva pink Ears- hearing intact Oropharynx- clear Neck- supple, no JVP Lymph- no cervical lymphadenopathy Lungs- Clear to ausculation bilaterally, normal work of breathing Heart- Regular rate and rhythm, no murmurs, rubs or gallops, PMI not laterally displaced GI- soft, NT, ND, + BS Extremities- no clubbing, cyanosis, or edema MS- no significant deformity or atrophy Skin- no rash or lesion Psych- euthymic mood, full affect Neuro- strength and sensation are intact  EKG-Sinus brady at 54 bpm, pr int 144 ms, qrs int 64 ms, qtc 396 ms  Epic records reviewed Monitor-8/28-Study Highlights     Paroxysmal atrial fibrillation - HR as high as 200bpm  Sinus bradycardia - 40 BPM (once seen at 9am)     Assessment and Plan: 1. Tachy-brady syndrome Pt states symptoms not that concerning to her at this time and she defers any antiarrythmic, especially if it means coming into the hospital. With her bradycardia, tiikosyn would be best choice since it does not slow the rhythm.  She does not appear to be symptomatic with bradycardia so will continue metoprolol 25 mg bid. Continue apixaban.  2. Chest/arm discomfort with exertion Has resolved with pt limiting sweeping/mopping  3. HTN Well managed  F/u as needed Dr. Anne Fu in 3-4 months  Elvina Sidle. [facility] [address] [place], [place] [address] [phone]

## 2016-05-13 DIAGNOSIS — I480 Paroxysmal atrial fibrillation: Secondary | ICD-10-CM | POA: Diagnosis not present

## 2016-05-14 DIAGNOSIS — I480 Paroxysmal atrial fibrillation: Secondary | ICD-10-CM | POA: Diagnosis not present

## 2016-05-17 DIAGNOSIS — I480 Paroxysmal atrial fibrillation: Secondary | ICD-10-CM | POA: Diagnosis not present

## 2016-05-24 ENCOUNTER — Telehealth: Payer: Self-pay | Admitting: Internal Medicine

## 2016-05-24 NOTE — Telephone Encounter (Signed)
APT. REMINDER CALL, NO ANSWER, NO VOICEMAIL °

## 2016-05-24 NOTE — Progress Notes (Addendum)
   CC: frequent falls    HPI: [name] is a 76 y.o. with past medical history as outlined below who presents to clinic for follow up of HTN, anxiety and new concern for frequent falls.   Please see problem list for status of the pt's chronic medical problems.  Past Medical History:  Diagnosis Date  . Bilateral knee pain   . Depression   . GERD (gastroesophageal reflux disease)   . High cholesterol   . Hypertension   . Low back pain   . Osteoarthritis    "legs" (11/11/2015)  . Osteoporosis   . Paroxysmal atrial fibrillation (HCC) 08/20/2014  . Pneumonia    hx of PNA 2013  . Urinary incontinence     Review of Systems:  Please see each problem below for a pertinent review of systems.  Physical Exam:  Vitals:   05/25/16 1348  BP: 109/64  Pulse: 77  Temp: 97.6 F (36.4 C)  TempSrc: Oral  SpO2: 96%  Weight: 204 lb (92.5 kg)   Physical Exam  Constitutional: She appears well-developed and well-nourished. No distress.  Cardiovascular: Normal rate and regular rhythm.   No murmur heard. Abdominal: Soft. Bowel sounds are normal. She exhibits no distension. There is no tenderness.  Neurological: She is alert.  Skin: Skin is warm and dry. She is not diaphoretic.  Healing laceration over right elbow   Psychiatric: She has a normal mood and affect. Her behavior is normal.     Assessment & Plan:   See Encounters Tab for problem based charting.  High risk for falls  Reassessed need for anticoagulation, the risk of thromboembolic stroke in the setting of afib/fluter is worse than risk of bleed so will continue Eliquis. She is on therapy for osteoporosis. She is hypotensive at the time of visit today so I will decrease her antihypertensive dose. [facility] aid for gait training and assessment of her home.  [facility]  - continue allendronate therapy  -decreased lisinopril   Addendum: [name] has requested a wheelchair for her difficulty with walking.  I agree with this request and think she will benefit from a wheelchair.   HTN  Hypotensive today. Denies symptoms of orthostatic hypotension.   -decreased to Lisinopril 5 mg qd   Anxiety and depression  Reports better control of anxiety with new dose of zoloft - refilled Zoloft 50 mg qHS   Chronic diarrhea  Significant improvement with cholestyramine PRN  -continue cholestyramine   Patient discussed with Dr. Oswaldo DoneVincent

## 2016-05-25 ENCOUNTER — Encounter: Payer: Self-pay | Admitting: Internal Medicine

## 2016-05-25 ENCOUNTER — Ambulatory Visit (INDEPENDENT_AMBULATORY_CARE_PROVIDER_SITE_OTHER): Payer: Commercial Managed Care - HMO | Admitting: Internal Medicine

## 2016-05-25 VITALS — BP 109/64 | HR 77 | Temp 97.6°F | Wt 204.0 lb

## 2016-05-25 DIAGNOSIS — Z9181 History of falling: Secondary | ICD-10-CM | POA: Diagnosis not present

## 2016-05-25 DIAGNOSIS — K529 Noninfective gastroenteritis and colitis, unspecified: Secondary | ICD-10-CM | POA: Diagnosis not present

## 2016-05-25 DIAGNOSIS — K219 Gastro-esophageal reflux disease without esophagitis: Secondary | ICD-10-CM

## 2016-05-25 DIAGNOSIS — R296 Repeated falls: Secondary | ICD-10-CM

## 2016-05-25 DIAGNOSIS — I4891 Unspecified atrial fibrillation: Secondary | ICD-10-CM | POA: Diagnosis not present

## 2016-05-25 DIAGNOSIS — F418 Other specified anxiety disorders: Secondary | ICD-10-CM | POA: Diagnosis not present

## 2016-05-25 DIAGNOSIS — Z0000 Encounter for general adult medical examination without abnormal findings: Secondary | ICD-10-CM

## 2016-05-25 DIAGNOSIS — I10 Essential (primary) hypertension: Secondary | ICD-10-CM

## 2016-05-25 DIAGNOSIS — Z23 Encounter for immunization: Secondary | ICD-10-CM | POA: Diagnosis not present

## 2016-05-25 DIAGNOSIS — F17290 Nicotine dependence, other tobacco product, uncomplicated: Secondary | ICD-10-CM

## 2016-05-25 DIAGNOSIS — Z7901 Long term (current) use of anticoagulants: Secondary | ICD-10-CM | POA: Diagnosis not present

## 2016-05-25 DIAGNOSIS — K915 Postcholecystectomy syndrome: Secondary | ICD-10-CM

## 2016-05-25 DIAGNOSIS — Z79899 Other long term (current) drug therapy: Secondary | ICD-10-CM

## 2016-05-25 DIAGNOSIS — I480 Paroxysmal atrial fibrillation: Secondary | ICD-10-CM

## 2016-05-25 MED ORDER — SERTRALINE HCL 50 MG PO TABS: 50.0000 mg | ORAL_TABLET | Freq: Every day | ORAL | 3 refills | Status: DC

## 2016-05-25 MED ORDER — METOPROLOL TARTRATE 25 MG PO TABS: 25.0000 mg | ORAL_TABLET | Freq: Two times a day (BID) | ORAL | 1 refills | Status: DC

## 2016-05-25 MED ORDER — CALCIUM CARBONATE-VITAMIN D 600-400 MG-UNIT PO TABS: 1.0000 | ORAL_TABLET | Freq: Three times a day (TID) | ORAL | 3 refills | Status: DC

## 2016-05-25 MED ORDER — ALENDRONATE SODIUM 70 MG PO TABS: 70.0000 mg | ORAL_TABLET | ORAL | 11 refills | Status: DC

## 2016-05-25 MED ORDER — LISINOPRIL 5 MG PO TABS: 10.0000 mg | ORAL_TABLET | Freq: Every day | ORAL | 3 refills | Status: DC

## 2016-05-25 MED ORDER — CHOLESTYRAMINE 4 G PO PACK: 1.0000 | PACK | Freq: Every day | ORAL | 11 refills | Status: DC | PRN

## 2016-05-25 MED ORDER — SIMVASTATIN 10 MG PO TABS: 10.0000 mg | ORAL_TABLET | Freq: Every day | ORAL | 3 refills | Status: DC

## 2016-05-25 MED ORDER — OMEPRAZOLE 20 MG PO CPDR: 20.0000 mg | DELAYED_RELEASE_CAPSULE | Freq: Every day | ORAL | 3 refills | Status: DC

## 2016-05-25 MED ORDER — APIXABAN 5 MG PO TABS: 5.0000 mg | ORAL_TABLET | Freq: Two times a day (BID) | ORAL | 3 refills | Status: DC

## 2016-05-25 NOTE — Patient Instructions (Signed)
It was a pleasure to see you today [name]!   START taking Lisinopril 5 mg daily ( you were previously taking lisinopril 10 mg daily)   Call us if you havent heard back about setting up the physical therapist or nurses aid

## 2016-05-29 DIAGNOSIS — R2681 Unsteadiness on feet: Secondary | ICD-10-CM | POA: Insufficient documentation

## 2016-05-29 NOTE — Assessment & Plan Note (Addendum)
Pt has a history of mechanical falls. Her family has taken the rugs out of her room but she continues to have falls which she thinks are related to the floor in her home being slick and her poor vision. She is usually able to slide to the floor and has not his her head recently. Uses her walker as most of the time in her home. [facility] for strengthening but not for gait training.   Reassessed need for anticoagulation, the risk of thromboembolic stroke in the setting of afib/fluter is worse than risk of bleed so will continue Eliquis. She is on therapy for osteoporosis. She is hypotensive at the time of visit today so I will decrease her antihypertensive dose. [facility] for gait training and assessment of her home.   [facility]  - continue allendronate therapy

## 2016-05-29 NOTE — Assessment & Plan Note (Signed)
Reports drastic improvement in diarrhea freqency since starting cholestyramine. She takes the packets only when needed. 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

## 2016-05-29 NOTE — Assessment & Plan Note (Addendum)
Hypotensive today with BP 109/64   -decreased to lisinopril 5 mg daily (was previously taking 10 mg qd), if blood pressure is well controlled at next visit may be able to d/c lisinopril completely.

## 2016-05-29 NOTE — Assessment & Plan Note (Signed)
Received PCV 13 today

## 2016-05-29 NOTE — Assessment & Plan Note (Signed)
Reports significant improvement in her feelings of anxiety with new dose of zoloft. Son has moved in to her home to help her care for herself.  - refilled Zoloft 50 mg qHS

## 2016-05-31 ENCOUNTER — Telehealth: Payer: Self-pay

## 2016-05-31 NOTE — Telephone Encounter (Signed)
Call to patient regarding Home health referral no answer no voicemail set up called home and mobile.

## 2016-05-31 NOTE — Progress Notes (Signed)
Internal Medicine Clinic Attending  Case discussed with Dr. Blum at the time of the visit.  We reviewed the resident's history and exam and pertinent patient test results.  I agree with the assessment, diagnosis, and plan of care documented in the resident's note. 

## 2016-06-02 DIAGNOSIS — H25813 Combined forms of age-related cataract, bilateral: Secondary | ICD-10-CM | POA: Diagnosis not present

## 2016-06-02 DIAGNOSIS — H3530 Unspecified macular degeneration: Secondary | ICD-10-CM | POA: Diagnosis not present

## 2016-06-03 DIAGNOSIS — M25561 Pain in right knee: Secondary | ICD-10-CM | POA: Diagnosis not present

## 2016-06-03 DIAGNOSIS — Z9181 History of falling: Secondary | ICD-10-CM | POA: Diagnosis not present

## 2016-06-03 DIAGNOSIS — M25562 Pain in left knee: Secondary | ICD-10-CM | POA: Diagnosis not present

## 2016-06-03 DIAGNOSIS — M1991 Primary osteoarthritis, unspecified site: Secondary | ICD-10-CM | POA: Diagnosis not present

## 2016-06-03 DIAGNOSIS — M545 Low back pain: Secondary | ICD-10-CM | POA: Diagnosis not present

## 2016-06-03 DIAGNOSIS — I480 Paroxysmal atrial fibrillation: Secondary | ICD-10-CM | POA: Diagnosis not present

## 2016-06-03 DIAGNOSIS — I10 Essential (primary) hypertension: Secondary | ICD-10-CM | POA: Diagnosis not present

## 2016-06-03 DIAGNOSIS — M810 Age-related osteoporosis without current pathological fracture: Secondary | ICD-10-CM | POA: Diagnosis not present

## 2016-06-03 DIAGNOSIS — F329 Major depressive disorder, single episode, unspecified: Secondary | ICD-10-CM | POA: Diagnosis not present

## 2016-06-07 DIAGNOSIS — M1991 Primary osteoarthritis, unspecified site: Secondary | ICD-10-CM | POA: Diagnosis not present

## 2016-06-07 DIAGNOSIS — M25561 Pain in right knee: Secondary | ICD-10-CM | POA: Diagnosis not present

## 2016-06-07 DIAGNOSIS — Z9181 History of falling: Secondary | ICD-10-CM | POA: Diagnosis not present

## 2016-06-07 DIAGNOSIS — M810 Age-related osteoporosis without current pathological fracture: Secondary | ICD-10-CM | POA: Diagnosis not present

## 2016-06-07 DIAGNOSIS — M25562 Pain in left knee: Secondary | ICD-10-CM | POA: Diagnosis not present

## 2016-06-07 DIAGNOSIS — F329 Major depressive disorder, single episode, unspecified: Secondary | ICD-10-CM | POA: Diagnosis not present

## 2016-06-07 DIAGNOSIS — I10 Essential (primary) hypertension: Secondary | ICD-10-CM | POA: Diagnosis not present

## 2016-06-07 DIAGNOSIS — M545 Low back pain: Secondary | ICD-10-CM | POA: Diagnosis not present

## 2016-06-07 DIAGNOSIS — I480 Paroxysmal atrial fibrillation: Secondary | ICD-10-CM | POA: Diagnosis not present

## 2016-06-09 ENCOUNTER — Other Ambulatory Visit: Payer: Self-pay | Admitting: Internal Medicine

## 2016-06-09 DIAGNOSIS — M25561 Pain in right knee: Secondary | ICD-10-CM | POA: Diagnosis not present

## 2016-06-09 DIAGNOSIS — I480 Paroxysmal atrial fibrillation: Secondary | ICD-10-CM | POA: Diagnosis not present

## 2016-06-09 DIAGNOSIS — Z9181 History of falling: Secondary | ICD-10-CM | POA: Diagnosis not present

## 2016-06-09 DIAGNOSIS — M1991 Primary osteoarthritis, unspecified site: Secondary | ICD-10-CM | POA: Diagnosis not present

## 2016-06-09 DIAGNOSIS — I10 Essential (primary) hypertension: Secondary | ICD-10-CM

## 2016-06-09 DIAGNOSIS — M545 Low back pain: Secondary | ICD-10-CM | POA: Diagnosis not present

## 2016-06-09 DIAGNOSIS — M810 Age-related osteoporosis without current pathological fracture: Secondary | ICD-10-CM | POA: Diagnosis not present

## 2016-06-09 DIAGNOSIS — M25562 Pain in left knee: Secondary | ICD-10-CM | POA: Diagnosis not present

## 2016-06-09 DIAGNOSIS — F329 Major depressive disorder, single episode, unspecified: Secondary | ICD-10-CM | POA: Diagnosis not present

## 2016-06-09 MED ORDER — LISINOPRIL 5 MG PO TABS: 5.0000 mg | ORAL_TABLET | Freq: Every day | ORAL | 4 refills | Status: DC

## 2016-06-14 DIAGNOSIS — M1991 Primary osteoarthritis, unspecified site: Secondary | ICD-10-CM | POA: Diagnosis not present

## 2016-06-14 DIAGNOSIS — Z9181 History of falling: Secondary | ICD-10-CM | POA: Diagnosis not present

## 2016-06-14 DIAGNOSIS — M25561 Pain in right knee: Secondary | ICD-10-CM | POA: Diagnosis not present

## 2016-06-14 DIAGNOSIS — M25562 Pain in left knee: Secondary | ICD-10-CM | POA: Diagnosis not present

## 2016-06-14 DIAGNOSIS — M810 Age-related osteoporosis without current pathological fracture: Secondary | ICD-10-CM | POA: Diagnosis not present

## 2016-06-14 DIAGNOSIS — M545 Low back pain: Secondary | ICD-10-CM | POA: Diagnosis not present

## 2016-06-14 DIAGNOSIS — F329 Major depressive disorder, single episode, unspecified: Secondary | ICD-10-CM | POA: Diagnosis not present

## 2016-06-14 DIAGNOSIS — I10 Essential (primary) hypertension: Secondary | ICD-10-CM | POA: Diagnosis not present

## 2016-06-14 DIAGNOSIS — I480 Paroxysmal atrial fibrillation: Secondary | ICD-10-CM | POA: Diagnosis not present

## 2016-06-16 DIAGNOSIS — I480 Paroxysmal atrial fibrillation: Secondary | ICD-10-CM | POA: Diagnosis not present

## 2016-06-16 DIAGNOSIS — M545 Low back pain: Secondary | ICD-10-CM | POA: Diagnosis not present

## 2016-06-16 DIAGNOSIS — Z9181 History of falling: Secondary | ICD-10-CM | POA: Diagnosis not present

## 2016-06-16 DIAGNOSIS — M1991 Primary osteoarthritis, unspecified site: Secondary | ICD-10-CM | POA: Diagnosis not present

## 2016-06-16 DIAGNOSIS — F329 Major depressive disorder, single episode, unspecified: Secondary | ICD-10-CM | POA: Diagnosis not present

## 2016-06-16 DIAGNOSIS — I10 Essential (primary) hypertension: Secondary | ICD-10-CM | POA: Diagnosis not present

## 2016-06-16 DIAGNOSIS — M25561 Pain in right knee: Secondary | ICD-10-CM | POA: Diagnosis not present

## 2016-06-16 DIAGNOSIS — M810 Age-related osteoporosis without current pathological fracture: Secondary | ICD-10-CM | POA: Diagnosis not present

## 2016-06-16 DIAGNOSIS — M25562 Pain in left knee: Secondary | ICD-10-CM | POA: Diagnosis not present

## 2016-06-17 DIAGNOSIS — M25561 Pain in right knee: Secondary | ICD-10-CM | POA: Diagnosis not present

## 2016-06-17 DIAGNOSIS — F329 Major depressive disorder, single episode, unspecified: Secondary | ICD-10-CM | POA: Diagnosis not present

## 2016-06-17 DIAGNOSIS — M545 Low back pain: Secondary | ICD-10-CM | POA: Diagnosis not present

## 2016-06-17 DIAGNOSIS — M810 Age-related osteoporosis without current pathological fracture: Secondary | ICD-10-CM | POA: Diagnosis not present

## 2016-06-17 DIAGNOSIS — Z9181 History of falling: Secondary | ICD-10-CM | POA: Diagnosis not present

## 2016-06-17 DIAGNOSIS — I10 Essential (primary) hypertension: Secondary | ICD-10-CM | POA: Diagnosis not present

## 2016-06-17 DIAGNOSIS — I480 Paroxysmal atrial fibrillation: Secondary | ICD-10-CM | POA: Diagnosis not present

## 2016-06-17 DIAGNOSIS — M25562 Pain in left knee: Secondary | ICD-10-CM | POA: Diagnosis not present

## 2016-06-17 DIAGNOSIS — M1991 Primary osteoarthritis, unspecified site: Secondary | ICD-10-CM | POA: Diagnosis not present

## 2016-06-21 DIAGNOSIS — H25813 Combined forms of age-related cataract, bilateral: Secondary | ICD-10-CM | POA: Diagnosis not present

## 2016-06-21 DIAGNOSIS — M25562 Pain in left knee: Secondary | ICD-10-CM | POA: Diagnosis not present

## 2016-06-21 DIAGNOSIS — I10 Essential (primary) hypertension: Secondary | ICD-10-CM | POA: Diagnosis not present

## 2016-06-21 DIAGNOSIS — H25811 Combined forms of age-related cataract, right eye: Secondary | ICD-10-CM | POA: Diagnosis not present

## 2016-06-21 DIAGNOSIS — M1991 Primary osteoarthritis, unspecified site: Secondary | ICD-10-CM | POA: Diagnosis not present

## 2016-06-21 DIAGNOSIS — M25561 Pain in right knee: Secondary | ICD-10-CM | POA: Diagnosis not present

## 2016-06-21 DIAGNOSIS — F329 Major depressive disorder, single episode, unspecified: Secondary | ICD-10-CM | POA: Diagnosis not present

## 2016-06-21 DIAGNOSIS — M810 Age-related osteoporosis without current pathological fracture: Secondary | ICD-10-CM | POA: Diagnosis not present

## 2016-06-21 DIAGNOSIS — I480 Paroxysmal atrial fibrillation: Secondary | ICD-10-CM | POA: Diagnosis not present

## 2016-06-21 DIAGNOSIS — H2511 Age-related nuclear cataract, right eye: Secondary | ICD-10-CM | POA: Diagnosis not present

## 2016-06-21 DIAGNOSIS — M545 Low back pain: Secondary | ICD-10-CM | POA: Diagnosis not present

## 2016-06-21 DIAGNOSIS — Z9181 History of falling: Secondary | ICD-10-CM | POA: Diagnosis not present

## 2016-06-22 ENCOUNTER — Telehealth: Payer: Self-pay

## 2016-06-22 NOTE — Telephone Encounter (Signed)
Jerilynn,[facility] - requesting verbal orders for "Skilled nursing for medication management and Medical SW to help reinstate home health aide".  VO given. If not ok, let me know. Thanks

## 2016-06-22 NOTE — Telephone Encounter (Signed)
Yes, I agree with that order.

## 2016-06-22 NOTE — Telephone Encounter (Signed)
Jerilynn from [facility] requesting VO. Please call back. 

## 2016-06-23 DIAGNOSIS — Z9181 History of falling: Secondary | ICD-10-CM | POA: Diagnosis not present

## 2016-06-23 DIAGNOSIS — M1991 Primary osteoarthritis, unspecified site: Secondary | ICD-10-CM | POA: Diagnosis not present

## 2016-06-23 DIAGNOSIS — M25561 Pain in right knee: Secondary | ICD-10-CM | POA: Diagnosis not present

## 2016-06-23 DIAGNOSIS — F329 Major depressive disorder, single episode, unspecified: Secondary | ICD-10-CM | POA: Diagnosis not present

## 2016-06-23 DIAGNOSIS — M545 Low back pain: Secondary | ICD-10-CM | POA: Diagnosis not present

## 2016-06-23 DIAGNOSIS — M810 Age-related osteoporosis without current pathological fracture: Secondary | ICD-10-CM | POA: Diagnosis not present

## 2016-06-23 DIAGNOSIS — M25562 Pain in left knee: Secondary | ICD-10-CM | POA: Diagnosis not present

## 2016-06-23 DIAGNOSIS — I480 Paroxysmal atrial fibrillation: Secondary | ICD-10-CM | POA: Diagnosis not present

## 2016-06-23 DIAGNOSIS — I10 Essential (primary) hypertension: Secondary | ICD-10-CM | POA: Diagnosis not present

## 2016-06-24 DIAGNOSIS — I10 Essential (primary) hypertension: Secondary | ICD-10-CM | POA: Diagnosis not present

## 2016-06-24 DIAGNOSIS — F329 Major depressive disorder, single episode, unspecified: Secondary | ICD-10-CM | POA: Diagnosis not present

## 2016-06-24 DIAGNOSIS — M545 Low back pain: Secondary | ICD-10-CM | POA: Diagnosis not present

## 2016-06-24 DIAGNOSIS — M25562 Pain in left knee: Secondary | ICD-10-CM | POA: Diagnosis not present

## 2016-06-24 DIAGNOSIS — M1991 Primary osteoarthritis, unspecified site: Secondary | ICD-10-CM | POA: Diagnosis not present

## 2016-06-24 DIAGNOSIS — M810 Age-related osteoporosis without current pathological fracture: Secondary | ICD-10-CM | POA: Diagnosis not present

## 2016-06-24 DIAGNOSIS — I480 Paroxysmal atrial fibrillation: Secondary | ICD-10-CM | POA: Diagnosis not present

## 2016-06-24 DIAGNOSIS — Z9181 History of falling: Secondary | ICD-10-CM | POA: Diagnosis not present

## 2016-06-24 DIAGNOSIS — M25561 Pain in right knee: Secondary | ICD-10-CM | POA: Diagnosis not present

## 2016-06-25 DIAGNOSIS — M25561 Pain in right knee: Secondary | ICD-10-CM | POA: Diagnosis not present

## 2016-06-25 DIAGNOSIS — M1991 Primary osteoarthritis, unspecified site: Secondary | ICD-10-CM | POA: Diagnosis not present

## 2016-06-25 DIAGNOSIS — I480 Paroxysmal atrial fibrillation: Secondary | ICD-10-CM | POA: Diagnosis not present

## 2016-06-25 DIAGNOSIS — M545 Low back pain: Secondary | ICD-10-CM | POA: Diagnosis not present

## 2016-06-25 DIAGNOSIS — M810 Age-related osteoporosis without current pathological fracture: Secondary | ICD-10-CM | POA: Diagnosis not present

## 2016-06-25 DIAGNOSIS — M25562 Pain in left knee: Secondary | ICD-10-CM | POA: Diagnosis not present

## 2016-06-25 DIAGNOSIS — Z9181 History of falling: Secondary | ICD-10-CM | POA: Diagnosis not present

## 2016-06-25 DIAGNOSIS — I10 Essential (primary) hypertension: Secondary | ICD-10-CM | POA: Diagnosis not present

## 2016-06-25 DIAGNOSIS — F329 Major depressive disorder, single episode, unspecified: Secondary | ICD-10-CM | POA: Diagnosis not present

## 2016-06-28 DIAGNOSIS — M25561 Pain in right knee: Secondary | ICD-10-CM | POA: Diagnosis not present

## 2016-06-28 DIAGNOSIS — M25562 Pain in left knee: Secondary | ICD-10-CM | POA: Diagnosis not present

## 2016-06-28 DIAGNOSIS — M810 Age-related osteoporosis without current pathological fracture: Secondary | ICD-10-CM | POA: Diagnosis not present

## 2016-06-28 DIAGNOSIS — M545 Low back pain: Secondary | ICD-10-CM | POA: Diagnosis not present

## 2016-06-28 DIAGNOSIS — M1991 Primary osteoarthritis, unspecified site: Secondary | ICD-10-CM | POA: Diagnosis not present

## 2016-06-28 DIAGNOSIS — I480 Paroxysmal atrial fibrillation: Secondary | ICD-10-CM | POA: Diagnosis not present

## 2016-06-28 DIAGNOSIS — Z9181 History of falling: Secondary | ICD-10-CM | POA: Diagnosis not present

## 2016-06-28 DIAGNOSIS — F329 Major depressive disorder, single episode, unspecified: Secondary | ICD-10-CM | POA: Diagnosis not present

## 2016-06-28 DIAGNOSIS — I10 Essential (primary) hypertension: Secondary | ICD-10-CM | POA: Diagnosis not present

## 2016-07-01 DIAGNOSIS — M25561 Pain in right knee: Secondary | ICD-10-CM | POA: Diagnosis not present

## 2016-07-01 DIAGNOSIS — M545 Low back pain: Secondary | ICD-10-CM | POA: Diagnosis not present

## 2016-07-01 DIAGNOSIS — I10 Essential (primary) hypertension: Secondary | ICD-10-CM | POA: Diagnosis not present

## 2016-07-01 DIAGNOSIS — M1991 Primary osteoarthritis, unspecified site: Secondary | ICD-10-CM | POA: Diagnosis not present

## 2016-07-01 DIAGNOSIS — M25562 Pain in left knee: Secondary | ICD-10-CM | POA: Diagnosis not present

## 2016-07-01 DIAGNOSIS — I480 Paroxysmal atrial fibrillation: Secondary | ICD-10-CM | POA: Diagnosis not present

## 2016-07-01 DIAGNOSIS — Z9181 History of falling: Secondary | ICD-10-CM | POA: Diagnosis not present

## 2016-07-01 DIAGNOSIS — M810 Age-related osteoporosis without current pathological fracture: Secondary | ICD-10-CM | POA: Diagnosis not present

## 2016-07-01 DIAGNOSIS — F329 Major depressive disorder, single episode, unspecified: Secondary | ICD-10-CM | POA: Diagnosis not present

## 2016-07-02 ENCOUNTER — Other Ambulatory Visit: Payer: Self-pay | Admitting: Internal Medicine

## 2016-07-02 DIAGNOSIS — M810 Age-related osteoporosis without current pathological fracture: Secondary | ICD-10-CM

## 2016-07-05 DIAGNOSIS — F329 Major depressive disorder, single episode, unspecified: Secondary | ICD-10-CM | POA: Diagnosis not present

## 2016-07-05 DIAGNOSIS — M545 Low back pain: Secondary | ICD-10-CM | POA: Diagnosis not present

## 2016-07-05 DIAGNOSIS — M810 Age-related osteoporosis without current pathological fracture: Secondary | ICD-10-CM | POA: Diagnosis not present

## 2016-07-05 DIAGNOSIS — M1991 Primary osteoarthritis, unspecified site: Secondary | ICD-10-CM | POA: Diagnosis not present

## 2016-07-05 DIAGNOSIS — M25561 Pain in right knee: Secondary | ICD-10-CM | POA: Diagnosis not present

## 2016-07-05 DIAGNOSIS — Z9181 History of falling: Secondary | ICD-10-CM | POA: Diagnosis not present

## 2016-07-05 DIAGNOSIS — I480 Paroxysmal atrial fibrillation: Secondary | ICD-10-CM | POA: Diagnosis not present

## 2016-07-05 DIAGNOSIS — I10 Essential (primary) hypertension: Secondary | ICD-10-CM | POA: Diagnosis not present

## 2016-07-05 DIAGNOSIS — M25562 Pain in left knee: Secondary | ICD-10-CM | POA: Diagnosis not present

## 2016-07-06 ENCOUNTER — Other Ambulatory Visit: Payer: Self-pay | Admitting: Student in an Organized Health Care Education/Training Program

## 2016-07-06 ENCOUNTER — Telehealth: Payer: Self-pay | Admitting: Internal Medicine

## 2016-07-06 NOTE — Telephone Encounter (Signed)
[facility] with [facility] calling with concerns for patient

## 2016-07-06 NOTE — Telephone Encounter (Signed)
Spoke w/ HHN, she states pt has been with a "cold" for appr 2 weeks, building up but at todays visit lung fields bil were not quite as clear as usual, pt seems more congested, vs are stable, no fevers. Tried to cal pt, lm for rtc

## 2016-07-07 ENCOUNTER — Other Ambulatory Visit: Payer: Self-pay | Admitting: Internal Medicine

## 2016-07-07 DIAGNOSIS — I480 Paroxysmal atrial fibrillation: Secondary | ICD-10-CM | POA: Diagnosis not present

## 2016-07-07 DIAGNOSIS — F329 Major depressive disorder, single episode, unspecified: Secondary | ICD-10-CM | POA: Diagnosis not present

## 2016-07-07 DIAGNOSIS — M1991 Primary osteoarthritis, unspecified site: Secondary | ICD-10-CM | POA: Diagnosis not present

## 2016-07-07 DIAGNOSIS — M810 Age-related osteoporosis without current pathological fracture: Secondary | ICD-10-CM | POA: Diagnosis not present

## 2016-07-07 DIAGNOSIS — M25561 Pain in right knee: Secondary | ICD-10-CM | POA: Diagnosis not present

## 2016-07-07 DIAGNOSIS — Z9181 History of falling: Secondary | ICD-10-CM | POA: Diagnosis not present

## 2016-07-07 DIAGNOSIS — I10 Essential (primary) hypertension: Secondary | ICD-10-CM | POA: Diagnosis not present

## 2016-07-07 DIAGNOSIS — M25562 Pain in left knee: Secondary | ICD-10-CM | POA: Diagnosis not present

## 2016-07-07 DIAGNOSIS — M545 Low back pain: Secondary | ICD-10-CM | POA: Diagnosis not present

## 2016-07-07 NOTE — Telephone Encounter (Signed)
I agree, she should come to clinic

## 2016-07-08 ENCOUNTER — Other Ambulatory Visit: Payer: Self-pay | Admitting: Internal Medicine

## 2016-07-08 DIAGNOSIS — I480 Paroxysmal atrial fibrillation: Secondary | ICD-10-CM

## 2016-07-12 DIAGNOSIS — I480 Paroxysmal atrial fibrillation: Secondary | ICD-10-CM | POA: Diagnosis not present

## 2016-07-13 DIAGNOSIS — I480 Paroxysmal atrial fibrillation: Secondary | ICD-10-CM | POA: Diagnosis not present

## 2016-07-13 NOTE — Telephone Encounter (Signed)
[facility] to [facility] and she will call back if pt agrees w/ visit or if pt status changes

## 2016-07-14 DIAGNOSIS — I480 Paroxysmal atrial fibrillation: Secondary | ICD-10-CM | POA: Diagnosis not present

## 2016-07-15 DIAGNOSIS — I480 Paroxysmal atrial fibrillation: Secondary | ICD-10-CM | POA: Diagnosis not present

## 2016-07-15 NOTE — Telephone Encounter (Signed)
Finally spoke to pt, she states her cough is about the same, she will talk to her niece and decide what day and time will be good for her to come to the visit

## 2016-07-16 DIAGNOSIS — Z9181 History of falling: Secondary | ICD-10-CM | POA: Diagnosis not present

## 2016-07-16 DIAGNOSIS — M25562 Pain in left knee: Secondary | ICD-10-CM | POA: Diagnosis not present

## 2016-07-16 DIAGNOSIS — M25561 Pain in right knee: Secondary | ICD-10-CM | POA: Diagnosis not present

## 2016-07-16 DIAGNOSIS — M1991 Primary osteoarthritis, unspecified site: Secondary | ICD-10-CM | POA: Diagnosis not present

## 2016-07-16 DIAGNOSIS — M810 Age-related osteoporosis without current pathological fracture: Secondary | ICD-10-CM | POA: Diagnosis not present

## 2016-07-16 DIAGNOSIS — M545 Low back pain: Secondary | ICD-10-CM | POA: Diagnosis not present

## 2016-07-16 DIAGNOSIS — I10 Essential (primary) hypertension: Secondary | ICD-10-CM | POA: Diagnosis not present

## 2016-07-16 DIAGNOSIS — I480 Paroxysmal atrial fibrillation: Secondary | ICD-10-CM | POA: Diagnosis not present

## 2016-07-16 DIAGNOSIS — F329 Major depressive disorder, single episode, unspecified: Secondary | ICD-10-CM | POA: Diagnosis not present

## 2016-07-19 DIAGNOSIS — I480 Paroxysmal atrial fibrillation: Secondary | ICD-10-CM | POA: Diagnosis not present

## 2016-07-20 DIAGNOSIS — I480 Paroxysmal atrial fibrillation: Secondary | ICD-10-CM | POA: Diagnosis not present

## 2016-07-21 DIAGNOSIS — I480 Paroxysmal atrial fibrillation: Secondary | ICD-10-CM | POA: Diagnosis not present

## 2016-07-22 DIAGNOSIS — I480 Paroxysmal atrial fibrillation: Secondary | ICD-10-CM | POA: Diagnosis not present

## 2016-07-23 DIAGNOSIS — I480 Paroxysmal atrial fibrillation: Secondary | ICD-10-CM | POA: Diagnosis not present

## 2016-07-26 DIAGNOSIS — I480 Paroxysmal atrial fibrillation: Secondary | ICD-10-CM | POA: Diagnosis not present

## 2016-07-27 DIAGNOSIS — I480 Paroxysmal atrial fibrillation: Secondary | ICD-10-CM | POA: Diagnosis not present

## 2016-07-28 DIAGNOSIS — I480 Paroxysmal atrial fibrillation: Secondary | ICD-10-CM | POA: Diagnosis not present

## 2016-07-29 DIAGNOSIS — I480 Paroxysmal atrial fibrillation: Secondary | ICD-10-CM | POA: Diagnosis not present

## 2016-07-30 DIAGNOSIS — I480 Paroxysmal atrial fibrillation: Secondary | ICD-10-CM | POA: Diagnosis not present

## 2016-08-02 DIAGNOSIS — I480 Paroxysmal atrial fibrillation: Secondary | ICD-10-CM | POA: Diagnosis not present

## 2016-08-03 DIAGNOSIS — I480 Paroxysmal atrial fibrillation: Secondary | ICD-10-CM | POA: Diagnosis not present

## 2016-08-04 DIAGNOSIS — I480 Paroxysmal atrial fibrillation: Secondary | ICD-10-CM | POA: Diagnosis not present

## 2016-08-05 DIAGNOSIS — I480 Paroxysmal atrial fibrillation: Secondary | ICD-10-CM | POA: Diagnosis not present

## 2016-08-06 DIAGNOSIS — I480 Paroxysmal atrial fibrillation: Secondary | ICD-10-CM | POA: Diagnosis not present

## 2016-08-09 DIAGNOSIS — I480 Paroxysmal atrial fibrillation: Secondary | ICD-10-CM | POA: Diagnosis not present

## 2016-08-10 ENCOUNTER — Other Ambulatory Visit: Payer: Self-pay | Admitting: Internal Medicine

## 2016-08-10 DIAGNOSIS — K219 Gastro-esophageal reflux disease without esophagitis: Secondary | ICD-10-CM

## 2016-08-10 DIAGNOSIS — I480 Paroxysmal atrial fibrillation: Secondary | ICD-10-CM | POA: Diagnosis not present

## 2016-08-11 DIAGNOSIS — I480 Paroxysmal atrial fibrillation: Secondary | ICD-10-CM | POA: Diagnosis not present

## 2016-08-12 DIAGNOSIS — I480 Paroxysmal atrial fibrillation: Secondary | ICD-10-CM | POA: Diagnosis not present

## 2016-08-15 DIAGNOSIS — I480 Paroxysmal atrial fibrillation: Secondary | ICD-10-CM | POA: Diagnosis not present

## 2016-08-16 DIAGNOSIS — I480 Paroxysmal atrial fibrillation: Secondary | ICD-10-CM | POA: Diagnosis not present

## 2016-08-17 DIAGNOSIS — I480 Paroxysmal atrial fibrillation: Secondary | ICD-10-CM | POA: Diagnosis not present

## 2016-08-18 DIAGNOSIS — I480 Paroxysmal atrial fibrillation: Secondary | ICD-10-CM | POA: Diagnosis not present

## 2016-08-19 DIAGNOSIS — I480 Paroxysmal atrial fibrillation: Secondary | ICD-10-CM | POA: Diagnosis not present

## 2016-08-20 DIAGNOSIS — I480 Paroxysmal atrial fibrillation: Secondary | ICD-10-CM | POA: Diagnosis not present

## 2016-08-23 DIAGNOSIS — I480 Paroxysmal atrial fibrillation: Secondary | ICD-10-CM | POA: Diagnosis not present

## 2016-08-24 DIAGNOSIS — I480 Paroxysmal atrial fibrillation: Secondary | ICD-10-CM | POA: Diagnosis not present

## 2016-08-26 ENCOUNTER — Telehealth: Payer: Self-pay | Admitting: Internal Medicine

## 2016-08-26 NOTE — Telephone Encounter (Signed)
APT. REMINDER CALL, NO ANSWER, NO VOICEMAIL °

## 2016-08-27 ENCOUNTER — Ambulatory Visit: Payer: Commercial Managed Care - HMO

## 2016-08-27 ENCOUNTER — Telehealth: Payer: Self-pay | Admitting: Internal Medicine

## 2016-08-27 ENCOUNTER — Other Ambulatory Visit: Payer: Self-pay | Admitting: *Deleted

## 2016-08-27 MED ORDER — ALENDRONATE SODIUM 70 MG PO TABS: 70.0000 mg | ORAL_TABLET | ORAL | 11 refills | Status: DC

## 2016-08-27 NOTE — Telephone Encounter (Signed)
Called , caregiver states that pt has diarrhea and it is too much to come out today, she will reschedule. Also wanted fosamax script sent to [facility], this is to be pt's new pharm.

## 2016-08-27 NOTE — Telephone Encounter (Signed)
Pt's caregiver cancelled app for today.  Patient still has Diarrhea and would like for some one to call her back.

## 2016-08-31 DIAGNOSIS — I480 Paroxysmal atrial fibrillation: Secondary | ICD-10-CM | POA: Diagnosis not present

## 2016-09-01 DIAGNOSIS — I480 Paroxysmal atrial fibrillation: Secondary | ICD-10-CM | POA: Diagnosis not present

## 2016-09-02 DIAGNOSIS — I480 Paroxysmal atrial fibrillation: Secondary | ICD-10-CM | POA: Diagnosis not present

## 2016-09-06 DIAGNOSIS — I480 Paroxysmal atrial fibrillation: Secondary | ICD-10-CM | POA: Diagnosis not present

## 2016-09-07 DIAGNOSIS — I480 Paroxysmal atrial fibrillation: Secondary | ICD-10-CM | POA: Diagnosis not present

## 2016-09-08 DIAGNOSIS — I480 Paroxysmal atrial fibrillation: Secondary | ICD-10-CM | POA: Diagnosis not present

## 2016-09-09 DIAGNOSIS — I480 Paroxysmal atrial fibrillation: Secondary | ICD-10-CM | POA: Diagnosis not present

## 2016-09-10 ENCOUNTER — Ambulatory Visit (INDEPENDENT_AMBULATORY_CARE_PROVIDER_SITE_OTHER): Payer: Medicare HMO | Admitting: Internal Medicine

## 2016-09-10 ENCOUNTER — Encounter: Payer: Self-pay | Admitting: Internal Medicine

## 2016-09-10 VITALS — BP 86/61 | HR 89 | Temp 97.6°F | Ht 60.0 in | Wt 190.0 lb

## 2016-09-10 DIAGNOSIS — R443 Hallucinations, unspecified: Secondary | ICD-10-CM | POA: Diagnosis not present

## 2016-09-10 DIAGNOSIS — R2681 Unsteadiness on feet: Secondary | ICD-10-CM

## 2016-09-10 DIAGNOSIS — Z993 Dependence on wheelchair: Secondary | ICD-10-CM

## 2016-09-10 DIAGNOSIS — Z818 Family history of other mental and behavioral disorders: Secondary | ICD-10-CM | POA: Diagnosis not present

## 2016-09-10 DIAGNOSIS — Z6837 Body mass index (BMI) 37.0-37.9, adult: Secondary | ICD-10-CM

## 2016-09-10 DIAGNOSIS — Z9181 History of falling: Secondary | ICD-10-CM

## 2016-09-10 DIAGNOSIS — L304 Erythema intertrigo: Secondary | ICD-10-CM | POA: Diagnosis not present

## 2016-09-10 DIAGNOSIS — E669 Obesity, unspecified: Secondary | ICD-10-CM

## 2016-09-10 MED ORDER — METOPROLOL TARTRATE 25 MG PO TABS: 12.5000 mg | ORAL_TABLET | Freq: Two times a day (BID) | ORAL | 1 refills | Status: DC

## 2016-09-10 MED ORDER — NYSTATIN 100000 UNIT/GM EX POWD: Freq: Four times a day (QID) | CUTANEOUS | 0 refills | Status: DC

## 2016-09-10 NOTE — Patient Instructions (Addendum)
General Instructions: - Stop Lisinopril - Decrease Metoprolol to 12.5 mg (half pill) twice daily - Decrease Sertraline to 25 mg daily for 1 week. Then take 25 mg every other day for 1 week. Then 25 mg every 3 days for 1 week. Then stop. - Neuro physical therapy referral- you will be contacted  Please bring your medicines with you each time you come to clinic.  Medicines may include prescription medications, over-the-counter medications, herbal remedies, eye drops, vitamins, or other pills.   Progress Toward Treatment Goals:  Treatment Goal 10/16/2014  Blood pressure at goal  Prevent falls -    Self Care Goals & Plans:  Self Care Goal 08/05/2015  Manage my medications take my medicines as prescribed; bring my medications to every visit; refill my medications on time  Monitor my health -  Eat healthy foods eat more vegetables; eat foods that are low in salt; eat baked foods instead of fried foods; eat fruit for snacks and desserts  Be physically active find an activity I enjoy  Meeting treatment goals -    No flowsheet data found.   Care Management & Community Referrals:  Referral 10/16/2014  Referrals made for care management support none needed
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
## 2016-09-12 DIAGNOSIS — L304 Erythema intertrigo: Secondary | ICD-10-CM | POA: Insufficient documentation

## 2016-09-12 DIAGNOSIS — R443 Hallucinations, unspecified: Secondary | ICD-10-CM | POA: Insufficient documentation

## 2016-09-12 NOTE — Assessment & Plan Note (Signed)
Intertrigo noted in bilateral groin areas. Prescribed nystatin powder.

## 2016-09-12 NOTE — Assessment & Plan Note (Signed)
Patient reporting a several hx of auditory and visual hallucinations that seem to be occurring more frequently over the last 5 months. She has a diagnosis of depression and anxiety but has never formally been seen by psychiatry. Her hallucinations could be medication-related vs psychiatric illness (bipolar with psychotic features vs schizophrenia) vs lewy body dementia. Will have her wean off of her sertraline as this could be causing her hallucinations. She was given specific instructions on how to do this. Will have her follow up in 1 month for reassessment. She may need referral to psych vs neurology.

## 2016-09-12 NOTE — Assessment & Plan Note (Signed)
Patient had a fall last week and reports having falls at least 2 times per month. Her falls are likely multifactorial in nature. She is very unsteady on her feet and required a lot of assistance during her office visit to stand up, transfer from her wheelchair to the lowered exam table, and then back to the wheelchair. Additionally, she was on the hypotensive side today with positive orthostatics and reported dizziness with standing. Will have her stop her Lisinopril and decrease Metoprolol to 12.5 mg twice daily. We also discussed the benefits of gait training with neuro rehab and having a home health nurse to help with tranfers and medications. Patient was agreeable to these interventions.

## 2016-09-13 ENCOUNTER — Telehealth: Payer: Self-pay

## 2016-09-13 DIAGNOSIS — I480 Paroxysmal atrial fibrillation: Secondary | ICD-10-CM | POA: Diagnosis not present

## 2016-09-14 DIAGNOSIS — I480 Paroxysmal atrial fibrillation: Secondary | ICD-10-CM | POA: Diagnosis not present

## 2016-09-14 NOTE — Progress Notes (Signed)
Internal Medicine Clinic Attending  Case discussed with Dr. Rivet at the time of the visit.  We reviewed the resident's history and exam and pertinent patient test results.  I agree with the assessment, diagnosis, and plan of care documented in the resident's note.  

## 2016-09-15 ENCOUNTER — Other Ambulatory Visit: Payer: Self-pay | Admitting: Internal Medicine

## 2016-09-15 DIAGNOSIS — I480 Paroxysmal atrial fibrillation: Secondary | ICD-10-CM | POA: Diagnosis not present

## 2016-09-18 ENCOUNTER — Encounter (HOSPITAL_COMMUNITY): Payer: Self-pay | Admitting: Emergency Medicine

## 2016-09-18 ENCOUNTER — Emergency Department (HOSPITAL_COMMUNITY): Payer: Medicare HMO

## 2016-09-18 ENCOUNTER — Emergency Department (HOSPITAL_COMMUNITY)
Admission: EM | Admit: 2016-09-18 | Discharge: 2016-09-18 | Disposition: A | Discharge status: Home / Self Care | Payer: Medicare HMO | Attending: Emergency Medicine | Admitting: Emergency Medicine

## 2016-09-18 DIAGNOSIS — I10 Essential (primary) hypertension: Secondary | ICD-10-CM | POA: Diagnosis not present

## 2016-09-18 DIAGNOSIS — S0181XA Laceration without foreign body of other part of head, initial encounter: Secondary | ICD-10-CM | POA: Insufficient documentation

## 2016-09-18 DIAGNOSIS — S098XXA Other specified injuries of head, initial encounter: Secondary | ICD-10-CM | POA: Diagnosis not present

## 2016-09-18 DIAGNOSIS — S0010XA Contusion of unspecified eyelid and periocular area, initial encounter: Secondary | ICD-10-CM | POA: Diagnosis not present

## 2016-09-18 DIAGNOSIS — Y999 Unspecified external cause status: Secondary | ICD-10-CM | POA: Insufficient documentation

## 2016-09-18 DIAGNOSIS — Z79899 Other long term (current) drug therapy: Secondary | ICD-10-CM | POA: Diagnosis not present

## 2016-09-18 DIAGNOSIS — Z7901 Long term (current) use of anticoagulants: Secondary | ICD-10-CM | POA: Insufficient documentation

## 2016-09-18 DIAGNOSIS — Y9389 Activity, other specified: Secondary | ICD-10-CM | POA: Insufficient documentation

## 2016-09-18 DIAGNOSIS — W19XXXA Unspecified fall, initial encounter: Secondary | ICD-10-CM

## 2016-09-18 DIAGNOSIS — S50311A Abrasion of right elbow, initial encounter: Secondary | ICD-10-CM | POA: Diagnosis not present

## 2016-09-18 DIAGNOSIS — S0990XA Unspecified injury of head, initial encounter: Secondary | ICD-10-CM | POA: Diagnosis not present

## 2016-09-18 DIAGNOSIS — S01111A Laceration without foreign body of right eyelid and periocular area, initial encounter: Secondary | ICD-10-CM | POA: Diagnosis not present

## 2016-09-18 DIAGNOSIS — W1839XA Other fall on same level, initial encounter: Secondary | ICD-10-CM | POA: Insufficient documentation

## 2016-09-18 DIAGNOSIS — Y9289 Other specified places as the place of occurrence of the external cause: Secondary | ICD-10-CM | POA: Diagnosis not present

## 2016-09-18 DIAGNOSIS — I6789 Other cerebrovascular disease: Secondary | ICD-10-CM | POA: Diagnosis not present

## 2016-09-18 MED ORDER — NYSTATIN 100000 UNIT/GM EX POWD: Freq: Four times a day (QID) | CUTANEOUS | 0 refills | Status: DC

## 2016-09-18 MED ORDER — BACITRACIN ZINC 500 UNIT/GM EX OINT
TOPICAL_OINTMENT | Freq: Once | CUTANEOUS | Status: AC
  Administered 2016-09-18: 1 via TOPICAL
  Filled 2016-09-18: qty 0.9

## 2016-09-18 MED ORDER — TETANUS-DIPHTH-ACELL PERTUSSIS 5-2.5-18.5 LF-MCG/0.5 IM SUSP
0.5000 mL | Freq: Once | INTRAMUSCULAR | Status: AC
  Administered 2016-09-18: 0.5 mL via INTRAMUSCULAR
  Filled 2016-09-18: qty 0.5

## 2016-09-18 NOTE — ED Triage Notes (Signed)
Pt from home via [facility] with c/o right eyebrow laceration and right elbow abrasion s/p fall from standing while in bed room.  EMS reports the area is a tight space.  Fall was heard immediately by family, no LOC.  Pt at baseline confusion.  NAD, A&O.

## 2016-09-18 NOTE — Discharge Instructions (Signed)
The outpatient clinic to call you in 2 days to arrange to go for all of your medications. If you don't hear from the office by noon on Monday, 09/20/2016 call to schedule an appointment and tell office staff that Dr. Ethelda ChickJacubowitz spoke with Dr. Dimple Caseyice about your case.Stop taking eliqius (apixaban), it is okay to wash gently around the laceration at your forehead however don't scrub the area vigorously.'s of infection including redness around the wound, for pain, drainage from the wound or fever. See your doctor or return if concern for any reason

## 2016-09-18 NOTE — Care Management Note (Signed)
Case Management Note  Patient Details  Name: Lucilla Edinatsy J Keir MRN: [id number] Date of Birth: 1941/01/10  Subjective/Objective:  76 y.o. Seen in the ED following a fall. CM received a consult for STAT W/C and resumption of HHPT/OT. Pt was in CT so CM spoke with Niece, Sheela Stacknnette Gray who can be reached @ [phone] who tells me Mrs Carlean JewsRoland [facility] the [facility] and was promised a W/C, lightweight that was never delivered. Will have [facility] resume their services at discharge. DME rep is no longer available today but will notify first thing in am.                   Action/Plan:CM will follow closely for disposition/discharge needs.    Expected Discharge Date:                  Expected Discharge Plan:  Home w Home Health Services  In-House Referral:  NA  Discharge planning Services  CM Consult  Post Acute Care Choice:  Durable Medical Equipment, Home Health, Resumption of Svcs/PTA Provider Choice offered to:   (Niece: Sheela Stacknnette Gray:: [phone])  DME Arranged:  Wheelchair manual DME Agency:  [facility]  HH Arranged:  [facility]  [facility]  Status of Service:  In process, will continue to follow  If discussed at Long Length of Stay Meetings, dates discussed:    Additional Comments:  Yvone NeuCrutchfield, Teryl Gubler M, RN 09/18/2016, 5:11 PM

## 2016-09-18 NOTE — ED Provider Notes (Signed)
[facility]-EMERGENCY DEPT Provider Note   CSN: [id number] Arrival date & time: 09/18/16  1350     History   Chief Complaint Chief Complaint  Patient presents with  . Fall  . Head Injury  . Elbow Injury    HPI Lisa Haynes is a 76 y.o. female.  HPI Patient fell while transferring from wheelchair possibly one hour ago, striking her head and right elbow no loss of consciousness. No other injury. She complains of headache at right temporal area at site of laceration. She denies pain elsewhere. She ambulates with walker and gait is unsteady her niece who accompanies her states that she has a wheelchair ordered which has not been delivered yet.. Past Medical History:  Diagnosis Date  . Bilateral knee pain   . Depression   . GERD (gastroesophageal reflux disease)   . High cholesterol   . Hypertension   . Low back pain   . Osteoarthritis    "legs" (11/11/2015)  . Osteoporosis   . Paroxysmal atrial fibrillation (HCC) 08/20/2014  . Pneumonia    hx of PNA 2013  . Urinary incontinence     Patient Active Problem List   Diagnosis Date Noted  . Hallucinations 09/12/2016  . Intertrigo 09/12/2016  . Unsteady gait 05/29/2016  . Atrial flutter (HCC) 11/20/2015  . 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 Paroxysmal atrial fibrillation (HCC) 09/25/2015  . Diverticulosis 06/20/2014  . Preventative health care 10/15/2010  . Mixed incontinence urge and stress 10/15/2010  . Hyperlipidemia 01/29/2009  . GERD 04/15/2008  . Depression with anxiety 09/01/2007  . Morbid obesity with BMI of 45.0-49.9, adult (HCC) 01/20/2007  . Essential hypertension 07/13/2006  . Osteoporosis 07/13/2006    Past Surgical History:  Procedure Laterality Date  . APPENDECTOMY    . CHOLECYSTECTOMY N/A 10/01/2014   Procedure: LAPAROSCOPIC CHOLECYSTECTOMY ;  Surgeon: Harriette Bouillon, MD;  Location: [facility] OR;  Service: General;  Laterality: N/A;  . DILATION AND CURETTAGE OF UTERUS     "after I had my boys"  . ERCP  N/A 09/27/2014   Procedure: ENDOSCOPIC RETROGRADE CHOLANGIOPANCREATOGRAPHY (ERCP);  Surgeon: Jeani Hawking, MD;  Location: [facility] ENDOSCOPY;  Service: Endoscopy;  Laterality: N/A;  . VAGINAL HYSTERECTOMY     with unilateral oophorectomy    OB History    No data available       Home Medications    Prior to Admission medications   Medication Sig Start Date End Date Taking? Authorizing Provider  acetaminophen (TYLENOL) 325 MG tablet Take 2 tablets (650 mg total) by mouth every 6 (six) hours as needed for pain. 06/29/12   Dow Adolph, MD  alendronate (FOSAMAX) 70 MG tablet Take 1 tablet (70 mg total) by mouth every 7 (seven) days. Take with a full glass of water on an empty stomach. 08/27/16   Eulah Pont, MD  apixaban (ELIQUIS) 5 MG TABS tablet Take 1 tablet (5 mg total) by mouth 2 (two) times daily. 05/25/16   Eulah Pont, MD  Calcium Carbonate-Vitamin D (CALCARB 600/D) 600-400 MG-UNIT tablet Take 1 tablet by mouth 3 (three) times daily with meals. 05/25/16   Eulah Pont, MD  cholestyramine Lanetta Inch) 4 g packet MIX AND TAKE 1 PACKET BY MOUTH DAILY AS NEEDED 09/16/16   Eulah Pont, MD  metoprolol tartrate (LOPRESSOR) 25 MG tablet Take 0.5 tablets (12.5 mg total) by mouth 2 (two) times daily. 09/10/16   Rivet, Iris Pert, MD  Multiple Vitamins-Minerals (MULTIVITAMIN WITH MINERALS) tablet Take 1 tablet by mouth daily.    [provider]  nitroGLYCERIN (NITROSTAT) 0.4 MG SL tablet DISSOLVE 1 TABLET UNDER THE TONGUE EVERY 5 MINUTES AS NEEDED FOR CHEST PAIN 07/07/16   Eulah Pont, MD  nystatin (MYCOSTATIN/NYSTOP) powder Apply topically 4 (four) times daily. 09/18/16   Doug Sou, MD  omeprazole (PRILOSEC) 20 MG capsule Take 1 capsule (20 mg total) by mouth daily. 05/25/16   Eulah Pont, MD  sertraline (ZOLOFT) 50 MG tablet Take 1 tablet (50 mg total) by mouth at bedtime. 05/25/16   Eulah Pont, MD  simvastatin (ZOCOR) 10 MG tablet Take 1 tablet (10 mg total) by mouth daily. 05/25/16   Eulah Pont, MD     Family History Family History  Problem Relation Age of Onset  . Heart disease Mother        onset 12s  . Heart disease Brother 69    Social History Social History  Substance Use Topics  . Smoking status: Never Smoker  . Smokeless tobacco: Current User    Types: Snuff  . Alcohol use No     Allergies   Morphine and related   Review of Systems Review of Systems  Musculoskeletal: Positive for gait problem.  Skin: Positive for rash and wound.       Reddened areas at groin and under breasts bilaterally  All other systems reviewed and are negative.    Physical Exam Updated Vital Signs BP 136/62   Pulse (!) 56   Temp 98.1 F (36.7 C) (Oral)   Resp 18   SpO2 99%   Physical Exam  Constitutional:  Chronically ill-appearing Glasgow Coma Score 15 alert  HENT:  1 cm linear laceration to right temporal area otherwise normocephalic atraumatic  Eyes: Conjunctivae are normal. Pupils are equal, round, and reactive to light.  Neck: Neck supple. No tracheal deviation present. No thyromegaly present.  Nontender  Cardiovascular: Normal rate and regular rhythm.   No murmur heard. Pulmonary/Chest: Effort normal and breath sounds normal.  Abdominal: Soft. Bowel sounds are normal. She exhibits no distension. There is no tenderness.  Obese  Musculoskeletal: Normal range of motion. She exhibits no edema or tenderness.  Left upper extremity with dime-sized abrasion at lateral aspect of the elbow. No deformity for range of motion neurovascular intact. All other extremities without contusion abrasion or tenderness neurovascularly intact. Pelvis stable nontender. Entire spine nontender  Neurological: She is alert. Coordination normal.  Skin: Skin is warm and dry. No rash noted. There is erythema.  Reddened areas at left inguinal crease underneath breasts bilaterally consistent with fungal infection  Psychiatric: She has a normal mood and affect.  Nursing note and vitals  reviewed.    ED Treatments / Results  Labs (all labs ordered are listed, but only abnormal results are displayed) Labs Reviewed - No data to display  EKG  EKG Interpretation None       Radiology Ct Head Wo Contrast  Result Date: 09/18/2016 CLINICAL DATA:  Laceration to right orbital region. EXAM: CT HEAD WITHOUT CONTRAST CT CERVICAL SPINE WITHOUT CONTRAST TECHNIQUE: Multidetector CT imaging of the head and cervical spine was performed following the standard protocol without intravenous contrast. Multiplanar CT image reconstructions of the cervical spine were also generated. COMPARISON:  Head CT 05/31/2012 FINDINGS: CT HEAD FINDINGS Brain: Ventricles and cisterns are within normal. There is mild age related atrophic change. There is minimal chronic ischemic microvascular disease. There is no mass, mass effect, shift of midline structures or acute hemorrhage. No evidence of acute infarction. Basal ganglia calcifications are present. Vascular: No hyperdense  vessel or unexpected calcification. Skull: Within normal. Sinuses/Orbits: Orbits are within normal. Paranasal sinuses and mastoid air cells are clear. Other: None. CT CERVICAL SPINE FINDINGS Alignment: Within normal. Atlantoaxial articulation is within normal. Skull base and vertebrae: Vertebral body heights are within normal. There is mild spondylosis throughout the cervical spine. Moderate uncovertebral joint spurring and facet arthropathy. Suggestion of partial fusion of the left facets of C3 and C4. No foraminal narrowing bilaterally at multiple levels due to adjacent bony spurring. No acute fracture or subluxation. Soft tissues and spinal canal: No prevertebral fluid or swelling. No visible canal hematoma. Disc levels:  Within normal. Upper chest: Within normal. Other: None. IMPRESSION: No acute intracranial findings. Minimal chronic ischemic microvascular disease and age related atrophic change. No acute cervical spine injury. Mild  spondylosis throughout the cervical spine with multilevel bilateral neural foraminal narrowing due to adjacent bony spurring. Electronically Signed   By: Elberta Fortis M.D.   On: 09/18/2016 15:33   Ct Cervical Spine Wo Contrast  Result Date: 09/18/2016 CLINICAL DATA:  Laceration to right orbital region. EXAM: CT HEAD WITHOUT CONTRAST CT CERVICAL SPINE WITHOUT CONTRAST TECHNIQUE: Multidetector CT imaging of the head and cervical spine was performed following the standard protocol without intravenous contrast. Multiplanar CT image reconstructions of the cervical spine were also generated. COMPARISON:  Head CT 05/31/2012 FINDINGS: CT HEAD FINDINGS Brain: Ventricles and cisterns are within normal. There is mild age related atrophic change. There is minimal chronic ischemic microvascular disease. There is no mass, mass effect, shift of midline structures or acute hemorrhage. No evidence of acute infarction. Basal ganglia calcifications are present. Vascular: No hyperdense vessel or unexpected calcification. Skull: Within normal. Sinuses/Orbits: Orbits are within normal. Paranasal sinuses and mastoid air cells are clear. Other: None. CT CERVICAL SPINE FINDINGS Alignment: Within normal. Atlantoaxial articulation is within normal. Skull base and vertebrae: Vertebral body heights are within normal. There is mild spondylosis throughout the cervical spine. Moderate uncovertebral joint spurring and facet arthropathy. Suggestion of partial fusion of the left facets of C3 and C4. No foraminal narrowing bilaterally at multiple levels due to adjacent bony spurring. No acute fracture or subluxation. Soft tissues and spinal canal: No prevertebral fluid or swelling. No visible canal hematoma. Disc levels:  Within normal. Upper chest: Within normal. Other: None. IMPRESSION: No acute intracranial findings. Minimal chronic ischemic microvascular disease and age related atrophic change. No acute cervical spine injury. Mild spondylosis  throughout the cervical spine with multilevel bilateral neural foraminal narrowing due to adjacent bony spurring. Electronically Signed   By: Elberta Fortis M.D.   On: 09/18/2016 15:33    Procedures .Marland KitchenLaceration Repair Date/Time: 09/18/2016 5:52 PM Performed by: Doug Sou Authorized by: Doug Sou   Consent:    Consent obtained:  Verbal   Consent given by:  Patient   Risks discussed:  Infection and pain Anesthesia (see MAR for exact dosages):    Anesthesia method:  None Laceration details:    Location:  Face   Face location:  Forehead   Length (cm):  1   Depth (mm):  2 Treatment:    Area cleansed with:  Saline   Amount of cleaning:  Standard   Irrigation solution:  Sterile saline Skin repair:    Repair method:  Tissue adhesive Post-procedure details:    Dressing:  Open (no dressing) Comments:     Dermabond with good result   (including critical care time)  Medications Ordered in ED Medications  Tdap (BOOSTRIX) injection 0.5 mL (not administered)  Patient is high fall risk. I discussed risk of stroke versus bleeding and head injury with patient and family are understanding. We will discontinue eliquis.. Patient's niece thinks that eliquis had in fact been discontinued recently. Although her medication list does not reflect that. Also write prescription for nystatin powder to be used on groin and under her breasts reddened areas she just of a fungal infection which has not been picked up by her niece Initial Impression / Assessment and Plan / ED Course  I have reviewed the triage vital signs and the nursing notes.  Pertinent labs & imaging results that were available during my care of the patient were reviewed by me and considered in my medical decision making (see chart for details).     Discussed with Dr. Dimple Caseyice, internal medicine resident physician who will arrange for patient be called in 2 days so that her medicines can be gone over. I've also consulted care  management who will arrange for face-to-face visit for physical therapy and wheelchair delivery 5:40 PM patient is able to walk with a walker without assistance Final Clinical Impressions(s) / ED Diagnoses  Diagnosis #1 fall #2 minor head injury #3   1 cm forehead laceration #4Abrasion right elbow Final diagnoses:  None    New Prescriptions New Prescriptions   NYSTATIN (MYCOSTATIN/NYSTOP) POWDER    Apply topically 4 (four) times daily.     Doug SouJacubowitz, Natina Wiginton, MD 09/18/16 [phone]

## 2016-09-18 NOTE — ED Notes (Signed)
Pt returns from radiology. 

## 2016-09-19 ENCOUNTER — Encounter: Payer: Self-pay | Admitting: *Deleted

## 2016-09-19 NOTE — Progress Notes (Unsigned)
Spoke with Lisa Haynes, Rep with [facility] to relay information gathered on 09/18/2016 during conversation with Niece of Lisa Haynes re: lack of w/c and Lisa Haynes services. Lisa Haynes will rectify this situation. No further CM needs at this time.

## 2016-09-20 DIAGNOSIS — I480 Paroxysmal atrial fibrillation: Secondary | ICD-10-CM | POA: Diagnosis not present

## 2016-09-21 DIAGNOSIS — I480 Paroxysmal atrial fibrillation: Secondary | ICD-10-CM | POA: Diagnosis not present

## 2016-09-22 DIAGNOSIS — I480 Paroxysmal atrial fibrillation: Secondary | ICD-10-CM | POA: Diagnosis not present

## 2016-09-22 DIAGNOSIS — F418 Other specified anxiety disorders: Secondary | ICD-10-CM | POA: Diagnosis not present

## 2016-09-22 DIAGNOSIS — K219 Gastro-esophageal reflux disease without esophagitis: Secondary | ICD-10-CM | POA: Diagnosis not present

## 2016-09-22 DIAGNOSIS — R2681 Unsteadiness on feet: Secondary | ICD-10-CM | POA: Diagnosis not present

## 2016-09-22 DIAGNOSIS — S0181XD Laceration without foreign body of other part of head, subsequent encounter: Secondary | ICD-10-CM | POA: Diagnosis not present

## 2016-09-22 DIAGNOSIS — M810 Age-related osteoporosis without current pathological fracture: Secondary | ICD-10-CM | POA: Diagnosis not present

## 2016-09-22 DIAGNOSIS — S59901D Unspecified injury of right elbow, subsequent encounter: Secondary | ICD-10-CM | POA: Diagnosis not present

## 2016-09-22 DIAGNOSIS — K915 Postcholecystectomy syndrome: Secondary | ICD-10-CM | POA: Diagnosis not present

## 2016-09-22 DIAGNOSIS — I10 Essential (primary) hypertension: Secondary | ICD-10-CM | POA: Diagnosis not present

## 2016-09-23 DIAGNOSIS — I480 Paroxysmal atrial fibrillation: Secondary | ICD-10-CM | POA: Diagnosis not present

## 2016-09-24 ENCOUNTER — Ambulatory Visit (INDEPENDENT_AMBULATORY_CARE_PROVIDER_SITE_OTHER): Payer: Medicare HMO | Admitting: Internal Medicine

## 2016-09-24 ENCOUNTER — Encounter: Payer: Self-pay | Admitting: *Deleted

## 2016-09-24 VITALS — BP 152/57 | HR 55 | Temp 98.0°F | Ht 61.5 in | Wt 197.8 lb

## 2016-09-24 DIAGNOSIS — Z9181 History of falling: Secondary | ICD-10-CM

## 2016-09-24 DIAGNOSIS — Z7901 Long term (current) use of anticoagulants: Secondary | ICD-10-CM

## 2016-09-24 DIAGNOSIS — Z993 Dependence on wheelchair: Secondary | ICD-10-CM | POA: Diagnosis not present

## 2016-09-24 DIAGNOSIS — I4892 Unspecified atrial flutter: Secondary | ICD-10-CM

## 2016-09-24 DIAGNOSIS — R296 Repeated falls: Secondary | ICD-10-CM

## 2016-09-24 DIAGNOSIS — W19XXXD Unspecified fall, subsequent encounter: Secondary | ICD-10-CM

## 2016-09-24 DIAGNOSIS — I480 Paroxysmal atrial fibrillation: Secondary | ICD-10-CM | POA: Diagnosis not present

## 2016-09-24 MED ORDER — ALENDRONATE SODIUM 70 MG PO TABS: 70.0000 mg | ORAL_TABLET | ORAL | 11 refills | Status: AC

## 2016-09-24 MED ORDER — APIXABAN 5 MG PO TABS: 5.0000 mg | ORAL_TABLET | Freq: Two times a day (BID) | ORAL | 3 refills | Status: DC

## 2016-09-24 NOTE — Assessment & Plan Note (Addendum)
Patient presents as a follow-up from an emergency department visit on 09/18/2016 after falling while transferring from her wheelchair. The fall caused the patient to strike her head and right elbow. There was no loss of consciousness. Per chart review, her evaluation in the emergency department was largely unremarkable. CT head and cervical spine without contrast showed no acute intracranial or bony abnormality. No lab work was done at that time. The emergency department physician told the patient to discontinue her apixaban  given her potential for bleeding risk. She presents today for follow-up. For reference, the patient's CHADSVASC score = 4 (age, HTN and Sex). Most recent note by electrophysiology says continue anticoagulation. She also has an extremely high fall risk given multiple recurrent falls. However, her CHADSVASC score of 4 gives her an approximate 8.5% incidence rate of stroke per year. We will have a discussion regarding risks and benefits of continuing anticoagulation. Additionally, I [facility] and they're able to see the patient on Wednesday, 09/29/2016 at 2:30 in the afternoon to continue this discussion and decide on continuing versus cessation of anticoagulation given overall assessment of risk versus benefit. However, for now I have asked the patient to restart taking the Eliquis. I think overall her stroke risk far outweighs the risk of an adverse complication secondary to anticoagulation. The family would like to have follow-up with cardiology so I think keeping this appointment would be beneficial for her care. For now I will continue the patient on Eliquis. Also, one of the most important aspects of her ongoing care will be to ensure that she is able to get a wheelchair. The clinic is currently working on this process. -- Appointment with electrophysiology in the atrial fibrillation clinic -- Continue eliquis 5 mg twice a day --  Continue to work on wheelchair

## 2016-09-24 NOTE — Progress Notes (Signed)
Internal Medicine Clinic Attending  Case discussed with Dr. Taylor at the time of the visit.  We reviewed the resident's history and exam and pertinent patient test results.  I agree with the assessment, diagnosis, and plan of care documented in the resident's note. 

## 2016-09-24 NOTE — Assessment & Plan Note (Addendum)
The patient has a history of atrial flutter and tachybradycardia syndrome. She also paroxysmal atrial fibrillation and is anticoagulated. She has had recurrent falls and her anticoagulation was held by the emergency department provider. We've advised the patient to start this back today given her CHADSVASC score of 4 and stroke risk. We have also made an appointment for the patient to follow-up with cardiology to continue discussions concerning this medication. However, given that the patient has been bradycardic and hypotensive associated with her falls we'll discontinue the use of her beta blocker. I greatly appreciate cardiology recommendations regarding the necessity of ongoing beta blockade therapy. I have stopped the beta blocker today purposefully knowing she has follow-up with cardiology next Wednesday. At that time if she is in an arrhythmia or tachycardic this medication could be restarted. During review of her vitals it appears that for the majority of the time she has had a normal heart rate documented or been bradycardic. It is unclear whether she will need ongoing beta blockade therapy. I think any attempt to reduce medications that could potentially cause hypotension or the patient to fall would have a great benefit for her overall care. Again, I would greatly appreciate recommendations from cardiology regarding the necessity of ongoing beta blocker therapy. -- Continue anticoagulation -- Discontinue metoprolol -- For additional details regarding her anticoagulation and her recent fall please see under problem-based charting "fall"

## 2016-09-24 NOTE — Patient Instructions (Signed)
It was a pleasure seeing you today. [facility].   Please restart Eliquis.  Please discontinue metoprolol.  Please follow-up with your cardiologist next Wednesday.  Please return to clinic in 4 weeks for power chair assessment and paperwork.  Please take this document with you to your cardiologist and ask that he/she review the notes documented in Epic from today's encounter.

## 2016-09-24 NOTE — Progress Notes (Signed)
   CC: Emergency department follow-up visit after a fall HPI: Lisa Haynes is a 76 y.o. female with a past medical history as listed below who presents for follow-up of an emergency department visit after sustaining a fall from her wheelchair.  Past Medical History:  Diagnosis Date  . Bilateral knee pain   . Depression   . GERD (gastroesophageal reflux disease)   . High cholesterol   . Hypertension   . Low back pain   . Osteoarthritis    "legs" (11/11/2015)  . Osteoporosis   . Paroxysmal atrial fibrillation (HCC) 08/20/2014  . Pneumonia    hx of PNA 2013  . Urinary incontinence      Review of Systems: Denies chest pain or shortness of breath. Denies nausea, vomiting or abdominal pain. Denies polyuria or dysuria. Physical Exam: Vitals:   09/24/16 1352  BP: (!) 152/57  Pulse: (!) 55  Temp: 98 F (36.7 C)  TempSrc: Oral  SpO2: 99%  Weight: 197 lb 12.8 oz (89.7 kg)  Height: 5' 1.5" (1.562 m)   BP (!) 152/57 (BP Location: Right Arm, Patient Position: Sitting, Cuff Size: Normal)   Pulse (!) 55   Temp 98 F (36.7 C) (Oral)   Ht 5' 1.5" (1.562 m)   Wt 197 lb 12.8 oz (89.7 kg)   SpO2 99%   BMI 36.77 kg/m  General appearance: alert, cooperative and Appears older than stated age Head: Normocephalic, without obvious abnormality, Well-healing laceration on the right temporal region Lungs: clear to auscultation bilaterally Heart: Distant heart sounds Abdomen: soft, non-tender; bowel sounds normal; no masses,  no organomegaly Extremities: Bilateral lower extremity edema  Assessment & Plan:  See encounters tab for problem based medical decision making. Patient discussed with Dr. Rogelia BogaButcher  Signed: Thomasene Lotaylor, Jasiah Elsen, MD 09/24/2016, 2:43 PM  Pager: [phone]

## 2016-09-27 DIAGNOSIS — M810 Age-related osteoporosis without current pathological fracture: Secondary | ICD-10-CM | POA: Diagnosis not present

## 2016-09-27 DIAGNOSIS — S0181XD Laceration without foreign body of other part of head, subsequent encounter: Secondary | ICD-10-CM | POA: Diagnosis not present

## 2016-09-27 DIAGNOSIS — S59901D Unspecified injury of right elbow, subsequent encounter: Secondary | ICD-10-CM | POA: Diagnosis not present

## 2016-09-27 DIAGNOSIS — K219 Gastro-esophageal reflux disease without esophagitis: Secondary | ICD-10-CM | POA: Diagnosis not present

## 2016-09-27 DIAGNOSIS — K915 Postcholecystectomy syndrome: Secondary | ICD-10-CM | POA: Diagnosis not present

## 2016-09-27 DIAGNOSIS — I10 Essential (primary) hypertension: Secondary | ICD-10-CM | POA: Diagnosis not present

## 2016-09-27 DIAGNOSIS — F418 Other specified anxiety disorders: Secondary | ICD-10-CM | POA: Diagnosis not present

## 2016-09-27 DIAGNOSIS — I480 Paroxysmal atrial fibrillation: Secondary | ICD-10-CM | POA: Diagnosis not present

## 2016-09-27 DIAGNOSIS — R2681 Unsteadiness on feet: Secondary | ICD-10-CM | POA: Diagnosis not present

## 2016-09-28 DIAGNOSIS — I480 Paroxysmal atrial fibrillation: Secondary | ICD-10-CM | POA: Diagnosis not present

## 2016-09-28 DIAGNOSIS — K219 Gastro-esophageal reflux disease without esophagitis: Secondary | ICD-10-CM | POA: Diagnosis not present

## 2016-09-28 DIAGNOSIS — S59901D Unspecified injury of right elbow, subsequent encounter: Secondary | ICD-10-CM | POA: Diagnosis not present

## 2016-09-28 DIAGNOSIS — K915 Postcholecystectomy syndrome: Secondary | ICD-10-CM | POA: Diagnosis not present

## 2016-09-28 DIAGNOSIS — I10 Essential (primary) hypertension: Secondary | ICD-10-CM | POA: Diagnosis not present

## 2016-09-28 DIAGNOSIS — R2681 Unsteadiness on feet: Secondary | ICD-10-CM | POA: Diagnosis not present

## 2016-09-28 DIAGNOSIS — M810 Age-related osteoporosis without current pathological fracture: Secondary | ICD-10-CM | POA: Diagnosis not present

## 2016-09-28 DIAGNOSIS — F418 Other specified anxiety disorders: Secondary | ICD-10-CM | POA: Diagnosis not present

## 2016-09-28 DIAGNOSIS — S0181XD Laceration without foreign body of other part of head, subsequent encounter: Secondary | ICD-10-CM | POA: Diagnosis not present

## 2016-09-29 ENCOUNTER — Inpatient Hospital Stay (HOSPITAL_COMMUNITY): Admission: RE | Admit: 2016-09-29 | Payer: Medicare HMO | Source: Ambulatory Visit | Admitting: Nurse Practitioner

## 2016-09-29 DIAGNOSIS — I480 Paroxysmal atrial fibrillation: Secondary | ICD-10-CM | POA: Diagnosis not present

## 2016-09-30 ENCOUNTER — Other Ambulatory Visit: Payer: Self-pay

## 2016-09-30 DIAGNOSIS — R2681 Unsteadiness on feet: Secondary | ICD-10-CM | POA: Diagnosis not present

## 2016-09-30 DIAGNOSIS — F418 Other specified anxiety disorders: Secondary | ICD-10-CM | POA: Diagnosis not present

## 2016-09-30 DIAGNOSIS — M810 Age-related osteoporosis without current pathological fracture: Secondary | ICD-10-CM | POA: Diagnosis not present

## 2016-09-30 DIAGNOSIS — S0181XD Laceration without foreign body of other part of head, subsequent encounter: Secondary | ICD-10-CM | POA: Diagnosis not present

## 2016-09-30 DIAGNOSIS — I480 Paroxysmal atrial fibrillation: Secondary | ICD-10-CM | POA: Diagnosis not present

## 2016-09-30 DIAGNOSIS — I10 Essential (primary) hypertension: Secondary | ICD-10-CM | POA: Diagnosis not present

## 2016-09-30 DIAGNOSIS — S59901D Unspecified injury of right elbow, subsequent encounter: Secondary | ICD-10-CM | POA: Diagnosis not present

## 2016-09-30 DIAGNOSIS — K915 Postcholecystectomy syndrome: Secondary | ICD-10-CM | POA: Diagnosis not present

## 2016-09-30 DIAGNOSIS — K219 Gastro-esophageal reflux disease without esophagitis: Secondary | ICD-10-CM | POA: Diagnosis not present

## 2016-09-30 NOTE — Telephone Encounter (Signed)
Requesting all meds to be filled @ [facility].

## 2016-10-01 DIAGNOSIS — S0181XD Laceration without foreign body of other part of head, subsequent encounter: Secondary | ICD-10-CM | POA: Diagnosis not present

## 2016-10-01 DIAGNOSIS — I480 Paroxysmal atrial fibrillation: Secondary | ICD-10-CM | POA: Diagnosis not present

## 2016-10-01 DIAGNOSIS — M810 Age-related osteoporosis without current pathological fracture: Secondary | ICD-10-CM | POA: Diagnosis not present

## 2016-10-01 DIAGNOSIS — K219 Gastro-esophageal reflux disease without esophagitis: Secondary | ICD-10-CM | POA: Diagnosis not present

## 2016-10-01 DIAGNOSIS — S59901D Unspecified injury of right elbow, subsequent encounter: Secondary | ICD-10-CM | POA: Diagnosis not present

## 2016-10-01 DIAGNOSIS — R2681 Unsteadiness on feet: Secondary | ICD-10-CM | POA: Diagnosis not present

## 2016-10-01 DIAGNOSIS — K915 Postcholecystectomy syndrome: Secondary | ICD-10-CM | POA: Diagnosis not present

## 2016-10-01 DIAGNOSIS — I10 Essential (primary) hypertension: Secondary | ICD-10-CM | POA: Diagnosis not present

## 2016-10-01 DIAGNOSIS — F418 Other specified anxiety disorders: Secondary | ICD-10-CM | POA: Diagnosis not present

## 2016-10-04 ENCOUNTER — Ambulatory Visit (HOSPITAL_COMMUNITY)
Admission: RE | Admit: 2016-10-04 | Discharge: 2016-10-04 | Disposition: A | Discharge status: Home / Self Care | Payer: Medicare HMO | Source: Ambulatory Visit | Attending: Nurse Practitioner | Admitting: Nurse Practitioner

## 2016-10-04 ENCOUNTER — Encounter (HOSPITAL_COMMUNITY): Payer: Self-pay | Admitting: Nurse Practitioner

## 2016-10-04 VITALS — BP 126/92 | HR 147 | Ht 61.5 in | Wt 194.0 lb

## 2016-10-04 DIAGNOSIS — E7800 Pure hypercholesterolemia, unspecified: Secondary | ICD-10-CM | POA: Diagnosis not present

## 2016-10-04 DIAGNOSIS — M1990 Unspecified osteoarthritis, unspecified site: Secondary | ICD-10-CM | POA: Insufficient documentation

## 2016-10-04 DIAGNOSIS — Z9049 Acquired absence of other specified parts of digestive tract: Secondary | ICD-10-CM | POA: Insufficient documentation

## 2016-10-04 DIAGNOSIS — I10 Essential (primary) hypertension: Secondary | ICD-10-CM | POA: Diagnosis not present

## 2016-10-04 DIAGNOSIS — M810 Age-related osteoporosis without current pathological fracture: Secondary | ICD-10-CM | POA: Diagnosis not present

## 2016-10-04 DIAGNOSIS — I4892 Unspecified atrial flutter: Secondary | ICD-10-CM | POA: Diagnosis not present

## 2016-10-04 DIAGNOSIS — I480 Paroxysmal atrial fibrillation: Secondary | ICD-10-CM | POA: Diagnosis not present

## 2016-10-04 DIAGNOSIS — F329 Major depressive disorder, single episode, unspecified: Secondary | ICD-10-CM | POA: Insufficient documentation

## 2016-10-04 DIAGNOSIS — F419 Anxiety disorder, unspecified: Secondary | ICD-10-CM | POA: Insufficient documentation

## 2016-10-04 DIAGNOSIS — K219 Gastro-esophageal reflux disease without esophagitis: Secondary | ICD-10-CM | POA: Insufficient documentation

## 2016-10-04 MED ORDER — METOPROLOL TARTRATE 25 MG PO TABS: 12.5000 mg | ORAL_TABLET | Freq: Two times a day (BID) | ORAL | 2 refills | Status: DC

## 2016-10-04 MED ORDER — METOPROLOL TARTRATE 25 MG PO TABS: 12.5000 mg | ORAL_TABLET | Freq: Two times a day (BID) | ORAL | 3 refills | Status: DC

## 2016-10-04 NOTE — Patient Instructions (Addendum)
Your physician has recommended you make the following change in your medication:  1)Restart Metoprolol 1/2 tablet twice a day  Have the home health RN call us with her heart rate tomorrow

## 2016-10-05 ENCOUNTER — Telehealth: Payer: Self-pay | Admitting: Internal Medicine

## 2016-10-05 DIAGNOSIS — M810 Age-related osteoporosis without current pathological fracture: Secondary | ICD-10-CM | POA: Diagnosis not present

## 2016-10-05 DIAGNOSIS — K219 Gastro-esophageal reflux disease without esophagitis: Secondary | ICD-10-CM | POA: Diagnosis not present

## 2016-10-05 DIAGNOSIS — F418 Other specified anxiety disorders: Secondary | ICD-10-CM | POA: Diagnosis not present

## 2016-10-05 DIAGNOSIS — I480 Paroxysmal atrial fibrillation: Secondary | ICD-10-CM | POA: Diagnosis not present

## 2016-10-05 DIAGNOSIS — I10 Essential (primary) hypertension: Secondary | ICD-10-CM | POA: Diagnosis not present

## 2016-10-05 DIAGNOSIS — R2681 Unsteadiness on feet: Secondary | ICD-10-CM | POA: Diagnosis not present

## 2016-10-05 DIAGNOSIS — S59901D Unspecified injury of right elbow, subsequent encounter: Secondary | ICD-10-CM | POA: Diagnosis not present

## 2016-10-05 DIAGNOSIS — K915 Postcholecystectomy syndrome: Secondary | ICD-10-CM | POA: Diagnosis not present

## 2016-10-05 DIAGNOSIS — S0181XD Laceration without foreign body of other part of head, subsequent encounter: Secondary | ICD-10-CM | POA: Diagnosis not present

## 2016-10-05 MED ORDER — CALCIUM CARBONATE-VITAMIN D 600-400 MG-UNIT PO TABS: 1.0000 | ORAL_TABLET | Freq: Three times a day (TID) | ORAL | 3 refills | Status: DC

## 2016-10-05 MED ORDER — CHOLESTYRAMINE 4 G PO PACK: PACK | ORAL | 6 refills | Status: DC

## 2016-10-05 MED ORDER — SIMVASTATIN 10 MG PO TABS: 10.0000 mg | ORAL_TABLET | Freq: Every day | ORAL | 4 refills | Status: DC

## 2016-10-05 MED ORDER — NITROGLYCERIN 0.4 MG SL SUBL: SUBLINGUAL_TABLET | SUBLINGUAL | 0 refills | Status: DC

## 2016-10-05 NOTE — Telephone Encounter (Signed)
MARTHA WITH [facility] [phone],STARTESD METOPROLO 12.5MG  TWICE DAILY, HEART RATE IS 64 AND BP 146/66

## 2016-10-05 NOTE — Progress Notes (Signed)
Primary Care Physician: Eulah Pont, MD Referring Physician: Dr. Paralee Cancel is a 76 y.o. female with a h/o afib/flutter, HTN, anxiety that was seen the afib clinic 01/2016 for evaluation of a  holter showing afib with RVR. She was admitted to  the hospital 7/11-7/13 after finding pt with a HR of 150's in the Internal Medicine clinic. Troponin's were negative and she was found to have a flutter. Stress testing was discussed because pt also had c/o of chest pain but it was deferred. She was originally found to have afib as an isolated incident with GB surgery in Feb 2016. She was started on anticoagulation for CHA2DS2VASc score of 4. She was also d/c on metoprolol 25 mg bid.A holter monitor was placed and she was found to have non sustained PAF with v rates up to 200 bpm, and sinus brady at 40 bpm, seen once at 9am. Dr. Anne Fu referred here to discuss antiarrythmic's, possibly tikosyn.  Discussed antiarrythmic's and she is not interested in starting a new drug or coming into the hospital to receive a drug to keep her in rhythm. At this time, she does not consider the occasional heart pounding to be an big interruption to her lifestyle ans she would prefer to keep things the same for now, unless her symptoms escalate. Her heartbeat is on slow side today but she is not symptomatic with this.  Returns to afib clinic 1/0/18. She is in SR. She has not noticed any irregular heart beat. No chest pain as she described earlier months ago. Doing well on blood thinners, no signs of bleeding.   Return to afib clinc 6/4 at the request of her PCP. Pt had a recent fall and was seen in the ER with trauma to rt temple area. CT of head was without intracranial findings. Anticoagulation was stopped in the ER. However, she followed up with her PCP and he felt that her stroke risk out weighted her fall risk and she was restarted on eliquis 5 mg bid. Metoprolol was also stopped at that visit for fear that low  bp/HR was contributing to falls and pt is in rapid atrial flutter at 147 bpm today.   Pt describes 4 falls since last fall. Mostly mechanical falls. Twice is when she got into the tub unassisted, instead of waiting for someone to help. Last fall she went out on the deck and noted a lot of leaves and decided to sweep. While she was doing this, she lost her balance and fell.Her most recent fall was because remolding was being done in their home and a piece of furniture was blocking her route into the bathroom. Struggling to get around the chest caused her to fall. She now has a personal aid that comes into the home several times a week to assist with her am care. She lives with her son.  Today, she denies symptoms of shortness of breath, orthopnea, PND, lower extremity edema, dizziness, presyncope, syncope, or neurologic sequela. The patient is tolerating medications without difficulties and is otherwise without complaint today.   Past Medical History:  Diagnosis Date  . Bilateral knee pain   . Depression   . GERD (gastroesophageal reflux disease)   . High cholesterol   . Hypertension   . Low back pain   . Osteoarthritis    "legs" (11/11/2015)  . Osteoporosis   . Paroxysmal atrial fibrillation (HCC) 08/20/2014  . Pneumonia    hx of PNA 2013  . Urinary  incontinence    Past Surgical History:  Procedure Laterality Date  . APPENDECTOMY    . CHOLECYSTECTOMY N/A 10/01/2014   Procedure: LAPAROSCOPIC CHOLECYSTECTOMY ;  Surgeon: Harriette Bouillonhomas Cornett, MD;  Location: [facility] OR;  Service: General;  Laterality: N/A;  . DILATION AND CURETTAGE OF UTERUS     "after I had my boys"  . ERCP N/A 09/27/2014   Procedure: ENDOSCOPIC RETROGRADE CHOLANGIOPANCREATOGRAPHY (ERCP);  Surgeon: Jeani HawkingPatrick Hung, MD;  [facility];  Service: Endoscopy;  Laterality: N/A;  . VAGINAL HYSTERECTOMY     with unilateral oophorectomy    Current Outpatient Prescriptions  Medication Sig Dispense Refill  . acetaminophen (TYLENOL)  325 MG tablet Take 2 tablets (650 mg total) by mouth every 6 (six) hours as needed for pain. 60 tablet 3  . alendronate (FOSAMAX) 70 MG tablet Take 1 tablet (70 mg total) by mouth every 7 (seven) days. Take with a full glass of water on an empty stomach. 4 tablet 11  . apixaban (ELIQUIS) 5 MG TABS tablet Take 1 tablet (5 mg total) by mouth 2 (two) times daily. 90 tablet 3  . Calcium Carbonate-Vitamin D (CALCARB 600/D) 600-400 MG-UNIT tablet Take 1 tablet by mouth 3 (three) times daily with meals. 30 tablet 3  . cholestyramine (QUESTRAN) 4 g packet MIX AND TAKE 1 PACKET BY MOUTH DAILY AS NEEDED 60 packet 6  . nitroGLYCERIN (NITROSTAT) 0.4 MG SL tablet DISSOLVE 1 TABLET UNDER THE TONGUE EVERY 5 MINUTES AS NEEDED FOR CHEST PAIN 300 tablet 0  . simvastatin (ZOCOR) 10 MG tablet Take 1 tablet (10 mg total) by mouth daily. 90 tablet 3  . metoprolol tartrate (LOPRESSOR) 25 MG tablet Take 0.5 tablets (12.5 mg total) by mouth 2 (two) times daily. 90 tablet 2  . Multiple Vitamins-Minerals (MULTIVITAMIN WITH MINERALS) tablet Take 1 tablet by mouth daily.     No current facility-administered medications for this encounter.     Allergies  Allergen Reactions  . Morphine And Related Other (See Comments)    Drives patient "out of" her "mind"    Social History   Social History  . Marital status: Widowed    Spouse name: N/A  . Number of children: N/A  . Years of education: N/A   Occupational History  . Retired    Social History Main Topics  . Smoking status: Never Smoker  . Smokeless tobacco: Current User    Types: Snuff  . Alcohol use No  . Drug use: No  . Sexual activity: No   Other Topics Concern  . Not on file   Social History Narrative   Retired: Motel work   Divorced    Regular exercise- no   Lives alone    Family History  Problem Relation Age of Onset  . Heart disease Mother        onset 6480s  . Heart disease Brother 2273    ROS- All systems are reviewed and negative except as  per the HPI above  Physical Exam: Vitals:   10/04/16 1509  BP: (!) 126/92  Pulse: (!) 147  Weight: 194 lb (88 kg)  Height: 5' 1.5" (1.562 m)    GEN- The patient is well appearing, alert and oriented x 3 today.   Head- normocephalic, atraumatic Eyes-  Sclera clear, conjunctiva pink Ears- hearing intact Oropharynx- clear Neck- supple, no JVP Lymph- no cervical lymphadenopathy Lungs- Clear to ausculation bilaterally, normal work of breathing Heart- Rapid irregular rate and rhythm, no murmurs, rubs or gallops, PMI not laterally  displaced GI- soft, NT, ND, + BS Extremities- no clubbing, cyanosis, or edema MS- no significant deformity or atrophy Skin- no rash or lesion Psych- euthymic mood, full affect Neuro- strength and sensation are intact  EKG-atrial flutter at 147 bpm, qrs int 58 ms, qtcc 500 ms Epic records reviewed Monitor-8/28-Study Highlights     Paroxysmal atrial fibrillation - HR as high as 200bpm  Sinus bradycardia - 40 BPM (once seen at 9am)     Assessment and Plan: 1. Rapid aflutter Off BB since last week for fear it was contributing to falls but by my history they sound mechanical I fear she will not tolerate the rapid rhythm for long Restart BB, metoprolol 25 mg 1/2 tab twice a day Offered to bring her back to the clinic to make sure that she returns to SR, but she would prefer her home nurse take V/S and call them into the office since they are in the home, several times a week. Long discussion with niece options of no anticoagulation due to h/o falls, minimizing bleeding risk and  which would increase in her stroke risk or continuing DOAC to minimize stroke For now pt and niece are in agreement to continue anticoagulation, stressing to pt fall precautions and asking for help and always using walker. She is pending a more mobile walker  2. HTN Stable   F/u afib clinic as needed Dr. Anne Fu as needed Dr. Obie Dredge Internal med 6/19  Elvina Sidle. [facility] [address] [place], [place] [address] [phone]

## 2016-10-06 DIAGNOSIS — I480 Paroxysmal atrial fibrillation: Secondary | ICD-10-CM | POA: Diagnosis not present

## 2016-10-06 NOTE — Telephone Encounter (Signed)
Will forward info to pcp for review.  Pt also scheduled for an appt on 10/19/2016 with pcp.Criss AlvineGoldston, Cookie Pore Cassady6/6/201810:04 AM

## 2016-10-06 NOTE — Telephone Encounter (Signed)
I spoke with [name] who saw Lisa Haynes at the [place] afib clinic on 6/4. At that time she was found to be in afib with RVR HR in the 150s so her metoprolol 12.5 BID was restarted. Prior to this the metoprolol had been held when she followed up in the acute care clinic after a fall. [name] feels that this bradycardia is reassuring given her recent RVR and that this HR is actually higher than what she usually has when she is on the metoprolol and as long as she is asymptomatic she should remain on the metoprolol. She did say that we could switch her to metoprolol succinate 12.5 mg at night to minimize her bradycardia. Can [name] be scheduled for an acute care visit at some point this week? If so I will call in the new prescription now and have her follow up for the change in acute care then keep her continuity clinic visit with me.

## 2016-10-07 DIAGNOSIS — K219 Gastro-esophageal reflux disease without esophagitis: Secondary | ICD-10-CM | POA: Diagnosis not present

## 2016-10-07 DIAGNOSIS — S59901D Unspecified injury of right elbow, subsequent encounter: Secondary | ICD-10-CM | POA: Diagnosis not present

## 2016-10-07 DIAGNOSIS — M810 Age-related osteoporosis without current pathological fracture: Secondary | ICD-10-CM | POA: Diagnosis not present

## 2016-10-07 DIAGNOSIS — S0181XD Laceration without foreign body of other part of head, subsequent encounter: Secondary | ICD-10-CM | POA: Diagnosis not present

## 2016-10-07 DIAGNOSIS — F418 Other specified anxiety disorders: Secondary | ICD-10-CM | POA: Diagnosis not present

## 2016-10-07 DIAGNOSIS — R2681 Unsteadiness on feet: Secondary | ICD-10-CM | POA: Diagnosis not present

## 2016-10-07 DIAGNOSIS — I480 Paroxysmal atrial fibrillation: Secondary | ICD-10-CM | POA: Diagnosis not present

## 2016-10-07 DIAGNOSIS — I10 Essential (primary) hypertension: Secondary | ICD-10-CM | POA: Diagnosis not present

## 2016-10-07 DIAGNOSIS — K915 Postcholecystectomy syndrome: Secondary | ICD-10-CM | POA: Diagnosis not present

## 2016-10-10 NOTE — Telephone Encounter (Signed)
I just followed up with Lisa Haynes and her niece Lisa Haynes. Lisa Haynes reports that she has not had any further falls and her son continues to stay in her home and support her. She denies any signs of orthostasis and says that she has been feeling well. She expresses that she is very confused about the medications that she is on so I called Lisa Haynes and confirmed that because Lisa Haynes is not feeling any symptoms of the bradycardia we will continue with the short acting metoprolol. If she was to develop any symptoms I would not hesitate to change this to a long acting dose given only at night. I am especially concerned given her history of frequent falls. She has a follow up scheduled with me 6/19.

## 2016-10-11 DIAGNOSIS — I480 Paroxysmal atrial fibrillation: Secondary | ICD-10-CM | POA: Diagnosis not present

## 2016-10-12 DIAGNOSIS — S59901D Unspecified injury of right elbow, subsequent encounter: Secondary | ICD-10-CM | POA: Diagnosis not present

## 2016-10-12 DIAGNOSIS — M810 Age-related osteoporosis without current pathological fracture: Secondary | ICD-10-CM | POA: Diagnosis not present

## 2016-10-12 DIAGNOSIS — I10 Essential (primary) hypertension: Secondary | ICD-10-CM | POA: Diagnosis not present

## 2016-10-12 DIAGNOSIS — S0181XD Laceration without foreign body of other part of head, subsequent encounter: Secondary | ICD-10-CM | POA: Diagnosis not present

## 2016-10-12 DIAGNOSIS — I480 Paroxysmal atrial fibrillation: Secondary | ICD-10-CM | POA: Diagnosis not present

## 2016-10-12 DIAGNOSIS — R2681 Unsteadiness on feet: Secondary | ICD-10-CM | POA: Diagnosis not present

## 2016-10-12 DIAGNOSIS — F418 Other specified anxiety disorders: Secondary | ICD-10-CM | POA: Diagnosis not present

## 2016-10-12 DIAGNOSIS — K219 Gastro-esophageal reflux disease without esophagitis: Secondary | ICD-10-CM | POA: Diagnosis not present

## 2016-10-12 DIAGNOSIS — K915 Postcholecystectomy syndrome: Secondary | ICD-10-CM | POA: Diagnosis not present

## 2016-10-13 DIAGNOSIS — I480 Paroxysmal atrial fibrillation: Secondary | ICD-10-CM | POA: Diagnosis not present

## 2016-10-14 DIAGNOSIS — I480 Paroxysmal atrial fibrillation: Secondary | ICD-10-CM | POA: Diagnosis not present

## 2016-10-15 DIAGNOSIS — M810 Age-related osteoporosis without current pathological fracture: Secondary | ICD-10-CM | POA: Diagnosis not present

## 2016-10-15 DIAGNOSIS — I10 Essential (primary) hypertension: Secondary | ICD-10-CM | POA: Diagnosis not present

## 2016-10-15 DIAGNOSIS — K219 Gastro-esophageal reflux disease without esophagitis: Secondary | ICD-10-CM | POA: Diagnosis not present

## 2016-10-15 DIAGNOSIS — K915 Postcholecystectomy syndrome: Secondary | ICD-10-CM | POA: Diagnosis not present

## 2016-10-15 DIAGNOSIS — S59901D Unspecified injury of right elbow, subsequent encounter: Secondary | ICD-10-CM | POA: Diagnosis not present

## 2016-10-15 DIAGNOSIS — I480 Paroxysmal atrial fibrillation: Secondary | ICD-10-CM | POA: Diagnosis not present

## 2016-10-15 DIAGNOSIS — R2681 Unsteadiness on feet: Secondary | ICD-10-CM | POA: Diagnosis not present

## 2016-10-15 DIAGNOSIS — S0181XD Laceration without foreign body of other part of head, subsequent encounter: Secondary | ICD-10-CM | POA: Diagnosis not present

## 2016-10-15 DIAGNOSIS — F418 Other specified anxiety disorders: Secondary | ICD-10-CM | POA: Diagnosis not present

## 2016-10-15 NOTE — Telephone Encounter (Signed)
error 

## 2016-10-18 DIAGNOSIS — I480 Paroxysmal atrial fibrillation: Secondary | ICD-10-CM | POA: Diagnosis not present

## 2016-10-19 ENCOUNTER — Ambulatory Visit (INDEPENDENT_AMBULATORY_CARE_PROVIDER_SITE_OTHER): Payer: Medicare HMO | Admitting: Internal Medicine

## 2016-10-19 ENCOUNTER — Encounter: Payer: Self-pay | Admitting: Internal Medicine

## 2016-10-19 VITALS — BP 153/61 | HR 55 | Temp 97.8°F | Wt 199.1 lb

## 2016-10-19 DIAGNOSIS — F418 Other specified anxiety disorders: Secondary | ICD-10-CM | POA: Diagnosis not present

## 2016-10-19 DIAGNOSIS — I10 Essential (primary) hypertension: Secondary | ICD-10-CM | POA: Diagnosis not present

## 2016-10-19 DIAGNOSIS — I4892 Unspecified atrial flutter: Secondary | ICD-10-CM | POA: Diagnosis not present

## 2016-10-19 DIAGNOSIS — F17290 Nicotine dependence, other tobacco product, uncomplicated: Secondary | ICD-10-CM

## 2016-10-19 DIAGNOSIS — R443 Hallucinations, unspecified: Secondary | ICD-10-CM | POA: Diagnosis not present

## 2016-10-19 DIAGNOSIS — Z9181 History of falling: Secondary | ICD-10-CM | POA: Diagnosis not present

## 2016-10-19 DIAGNOSIS — I480 Paroxysmal atrial fibrillation: Secondary | ICD-10-CM

## 2016-10-19 DIAGNOSIS — Z7901 Long term (current) use of anticoagulants: Secondary | ICD-10-CM | POA: Diagnosis not present

## 2016-10-19 DIAGNOSIS — H25812 Combined forms of age-related cataract, left eye: Secondary | ICD-10-CM | POA: Diagnosis not present

## 2016-10-19 DIAGNOSIS — H353134 Nonexudative age-related macular degeneration, bilateral, advanced atrophic with subfoveal involvement: Secondary | ICD-10-CM | POA: Diagnosis not present

## 2016-10-19 DIAGNOSIS — Z961 Presence of intraocular lens: Secondary | ICD-10-CM | POA: Diagnosis not present

## 2016-10-19 NOTE — Patient Instructions (Signed)
[name],   It was a pleasure seeing you today. I am not making any changes to your medications today. Keep up the good work with your medications!   I have referred you to see a psychiatrist to talk about your memory and the things that you are seeing.  Please schedule a follow up appointment to see me in two months

## 2016-10-19 NOTE — Progress Notes (Signed)
CC: Hallucinations   HPI:  Lisa Haynes is a 76 y.o. PMH paroxysmal atrial fibrillation/ atrial flutter, anxiety, depression, hypertension who presents to continuity clinic for follow up. She had come into the office in May 2018 complaining of hallucinations today followed up she says that she is continuing to have these hallucinations sites being sertraline. She says that she is seeing people who she does not recognize standing on her bed and she believes she is going crazy. Her niece feels that she is also some showed signs of paranoia and believes this is all due to "chemical imbalance" which she suffered by multiple family members. She also mentions that they into the eye doctor earlier today and were told told that [name]'s vision had worsened and she needs a referral to a retinal specialist. [name] denies depression, change in appetite, homicidal ideations, suicidal ideations, or dysuria.   Past Medical History:  Diagnosis Date  . Bilateral knee pain   . Depression   . GERD (gastroesophageal reflux disease)   . High cholesterol   . Hypertension   . Low back pain   . Osteoarthritis    "legs" (11/11/2015)  . Osteoporosis   . Paroxysmal atrial fibrillation (HCC) 08/20/2014  . Pneumonia    hx of PNA 2013  . Urinary incontinence     Review of Systems:  See history of present illness and problem is for pertinent review of systems  Physical Exam:  Vitals:   10/19/16 1456  BP: (!) 156/67  Pulse: (!) 57  Temp: 97.8 F (36.6 C)  TempSrc: Oral  SpO2: 100%  Weight: 199 lb 1.6 oz (90.3 kg)   Physical Exam  Constitutional: No distress.  HENT:  Head: Normocephalic and atraumatic.  Cardiovascular: Normal rate and regular rhythm.   No murmur heard. 1+ lower extremity nonpitting edema  Pulmonary/Chest: Effort normal and breath sounds normal. No respiratory distress. She has no wheezes. She has no rales.  Abdominal: Soft. Bowel sounds are normal. There is no tenderness.  There is no guarding.  Neurological: She is alert.  Skin: Skin is warm and dry. She is not diaphoretic.   Mini Cog: She was able to recall 1 of 3 words after 5 minutes. When asked to draw a clock she wrote "clock" on the paper, when asked again to draw a circular clock she wrote "clock" a second time.   Assessment & Plan:   Hallucinations I believe these hallucinations may in part be due to her worsening vision however I am concerned that she may also have an organic cause of these. He did not asked the mini cog exam. She reports some symptoms that were concerning for hypothyroidism however he had a TSH was was slightly elevated and normal T4 in July of last year. I would like to try Wellbutrin for her anxiety but will leave she would benefit more from seeing a Geripsychiatrist to address all of these concerns. -Referral to psychiatry with hopes that she can see a geripsychiatrist  Atrial fibrillation/flutter At last office visit [name] was following up after a fall and metoprolol was held. Since then she has initiated management for this at the atrial fibrillation clinic. She last saw them 6/4 and was found to be in atrial fibrillation with RVR metoprolol was restarted. Her home health nurse had contacted me to inform me that she had bradycardia. I contacted [name] the A. fib clinic with this and she advised continuing the metoprolol as long as she remained  asymptomatic. She did mention that we could switch this to metoprolol succinate 12.5 mg at night if she became symptomatic. She is in normal sinus rhythm today and reports no symptoms of bradycardia. Continue metoprolol 12.5 mg twice daily Continue Eliquis  Hypertension BP Readings from Last 3 Encounters:  10/19/16 (!) 153/61  10/04/16 (!) 126/92  09/24/16 (!) 152/57  Since the time of our last visit [name] suffered a fall and lisinopril was discontinued completely and she has not had a fall since. Today she is hypertensive  and describes still sometimes feeling unsteady on her feet. I worry that starting her on an antihypertensive will contribute to her history of falls. -Continue to monitor  See Encounters Tab for problem based charting.  Patient discussed with [name]

## 2016-10-20 DIAGNOSIS — I10 Essential (primary) hypertension: Secondary | ICD-10-CM | POA: Diagnosis not present

## 2016-10-20 DIAGNOSIS — R2681 Unsteadiness on feet: Secondary | ICD-10-CM | POA: Diagnosis not present

## 2016-10-20 DIAGNOSIS — I480 Paroxysmal atrial fibrillation: Secondary | ICD-10-CM | POA: Diagnosis not present

## 2016-10-20 DIAGNOSIS — K915 Postcholecystectomy syndrome: Secondary | ICD-10-CM | POA: Diagnosis not present

## 2016-10-20 DIAGNOSIS — S59901D Unspecified injury of right elbow, subsequent encounter: Secondary | ICD-10-CM | POA: Diagnosis not present

## 2016-10-20 DIAGNOSIS — S0181XD Laceration without foreign body of other part of head, subsequent encounter: Secondary | ICD-10-CM | POA: Diagnosis not present

## 2016-10-20 DIAGNOSIS — K219 Gastro-esophageal reflux disease without esophagitis: Secondary | ICD-10-CM | POA: Diagnosis not present

## 2016-10-20 DIAGNOSIS — F418 Other specified anxiety disorders: Secondary | ICD-10-CM | POA: Diagnosis not present

## 2016-10-20 DIAGNOSIS — M810 Age-related osteoporosis without current pathological fracture: Secondary | ICD-10-CM | POA: Diagnosis not present

## 2016-10-21 DIAGNOSIS — S59901D Unspecified injury of right elbow, subsequent encounter: Secondary | ICD-10-CM | POA: Diagnosis not present

## 2016-10-21 DIAGNOSIS — K219 Gastro-esophageal reflux disease without esophagitis: Secondary | ICD-10-CM | POA: Diagnosis not present

## 2016-10-21 DIAGNOSIS — I10 Essential (primary) hypertension: Secondary | ICD-10-CM | POA: Diagnosis not present

## 2016-10-21 DIAGNOSIS — K915 Postcholecystectomy syndrome: Secondary | ICD-10-CM | POA: Diagnosis not present

## 2016-10-21 DIAGNOSIS — M810 Age-related osteoporosis without current pathological fracture: Secondary | ICD-10-CM | POA: Diagnosis not present

## 2016-10-21 DIAGNOSIS — F418 Other specified anxiety disorders: Secondary | ICD-10-CM | POA: Diagnosis not present

## 2016-10-21 DIAGNOSIS — R2681 Unsteadiness on feet: Secondary | ICD-10-CM | POA: Diagnosis not present

## 2016-10-21 DIAGNOSIS — S0181XD Laceration without foreign body of other part of head, subsequent encounter: Secondary | ICD-10-CM | POA: Diagnosis not present

## 2016-10-21 DIAGNOSIS — I480 Paroxysmal atrial fibrillation: Secondary | ICD-10-CM | POA: Diagnosis not present

## 2016-10-21 NOTE — Assessment & Plan Note (Signed)
BP Readings from Last 3 Encounters:  10/19/16 (!) 153/61  10/04/16 (!) 126/92  09/24/16 (!) 152/57   Since the time of our last visit Lisa Haynes suffered a fall and lisinopril was discontinued completely and she has not had a fall since. Today she is hypertensive and describes still sometimes feeling unsteady on her feet. I worry that starting her on an antihypertensive will contribute to her history of falls. -Continue to monitor

## 2016-10-21 NOTE — Assessment & Plan Note (Signed)
I believe these hallucinations may in part be due to her worsening vision however I am concerned that she may also have an organic cause of these. He did not asked the mini cog exam. She reports some symptoms that were concerning for hypothyroidism however he had a TSH was was slightly elevated and normal T4 in July of last year. I would like to try Wellbutrin for her anxiety but will leave she would benefit more from seeing a Geripsychiatrist to address all of these concerns. -Referral to psychiatry with hopes that she can see a geripsychiatrist

## 2016-10-21 NOTE — Assessment & Plan Note (Signed)
At last office visit Lisa Haynes was following up after a fall and metoprolol was held. Since then she has initiated management for this at the atrial fibrillation clinic. She last saw them 6/4 and was found to be in atrial fibrillation with RVR metoprolol was restarted. Her home health nurse had contacted me to inform me that she had bradycardia. I contacted [name] the A. fib clinic with this and she advised continuing the metoprolol as long as she remained asymptomatic. She did mention that we could switch this to metoprolol succinate 12.5 mg at night if she became symptomatic. She is in normal sinus rhythm today and reports no symptoms of bradycardia. Continue metoprolol 12.5 mg twice daily Continue Eliquis

## 2016-10-22 DIAGNOSIS — F418 Other specified anxiety disorders: Secondary | ICD-10-CM | POA: Diagnosis not present

## 2016-10-22 DIAGNOSIS — K219 Gastro-esophageal reflux disease without esophagitis: Secondary | ICD-10-CM | POA: Diagnosis not present

## 2016-10-22 DIAGNOSIS — I10 Essential (primary) hypertension: Secondary | ICD-10-CM | POA: Diagnosis not present

## 2016-10-22 DIAGNOSIS — R2681 Unsteadiness on feet: Secondary | ICD-10-CM | POA: Diagnosis not present

## 2016-10-22 DIAGNOSIS — I480 Paroxysmal atrial fibrillation: Secondary | ICD-10-CM | POA: Diagnosis not present

## 2016-10-22 DIAGNOSIS — S0181XD Laceration without foreign body of other part of head, subsequent encounter: Secondary | ICD-10-CM | POA: Diagnosis not present

## 2016-10-22 DIAGNOSIS — K915 Postcholecystectomy syndrome: Secondary | ICD-10-CM | POA: Diagnosis not present

## 2016-10-22 DIAGNOSIS — M810 Age-related osteoporosis without current pathological fracture: Secondary | ICD-10-CM | POA: Diagnosis not present

## 2016-10-22 DIAGNOSIS — S59901D Unspecified injury of right elbow, subsequent encounter: Secondary | ICD-10-CM | POA: Diagnosis not present

## 2016-10-22 NOTE — Progress Notes (Signed)
Internal Medicine Clinic Attending  Case discussed with Dr. Obie DredgeBlum at the time of the visit.  We reviewed the resident's history and exam and pertinent patient test results.  I agree with the assessment, diagnosis, and plan of care documented in the resident's note. b

## 2016-10-25 DIAGNOSIS — S0181XD Laceration without foreign body of other part of head, subsequent encounter: Secondary | ICD-10-CM | POA: Diagnosis not present

## 2016-10-25 DIAGNOSIS — M810 Age-related osteoporosis without current pathological fracture: Secondary | ICD-10-CM | POA: Diagnosis not present

## 2016-10-25 DIAGNOSIS — R2681 Unsteadiness on feet: Secondary | ICD-10-CM | POA: Diagnosis not present

## 2016-10-25 DIAGNOSIS — F418 Other specified anxiety disorders: Secondary | ICD-10-CM | POA: Diagnosis not present

## 2016-10-25 DIAGNOSIS — I10 Essential (primary) hypertension: Secondary | ICD-10-CM | POA: Diagnosis not present

## 2016-10-25 DIAGNOSIS — I480 Paroxysmal atrial fibrillation: Secondary | ICD-10-CM | POA: Diagnosis not present

## 2016-10-25 DIAGNOSIS — K219 Gastro-esophageal reflux disease without esophagitis: Secondary | ICD-10-CM | POA: Diagnosis not present

## 2016-10-25 DIAGNOSIS — K915 Postcholecystectomy syndrome: Secondary | ICD-10-CM | POA: Diagnosis not present

## 2016-10-25 DIAGNOSIS — S59901D Unspecified injury of right elbow, subsequent encounter: Secondary | ICD-10-CM | POA: Diagnosis not present

## 2016-10-26 DIAGNOSIS — I480 Paroxysmal atrial fibrillation: Secondary | ICD-10-CM | POA: Diagnosis not present

## 2016-10-26 NOTE — Addendum Note (Signed)
Addended by: Neomia DearPOWERS, Doni Bacha E on: 10/26/2016 07:38 AM   Modules accepted: Orders

## 2016-10-27 DIAGNOSIS — I480 Paroxysmal atrial fibrillation: Secondary | ICD-10-CM | POA: Diagnosis not present

## 2016-10-28 DIAGNOSIS — K219 Gastro-esophageal reflux disease without esophagitis: Secondary | ICD-10-CM | POA: Diagnosis not present

## 2016-10-28 DIAGNOSIS — I480 Paroxysmal atrial fibrillation: Secondary | ICD-10-CM | POA: Diagnosis not present

## 2016-10-28 DIAGNOSIS — F418 Other specified anxiety disorders: Secondary | ICD-10-CM | POA: Diagnosis not present

## 2016-10-28 DIAGNOSIS — I10 Essential (primary) hypertension: Secondary | ICD-10-CM | POA: Diagnosis not present

## 2016-10-28 DIAGNOSIS — R2681 Unsteadiness on feet: Secondary | ICD-10-CM | POA: Diagnosis not present

## 2016-10-28 DIAGNOSIS — S0181XD Laceration without foreign body of other part of head, subsequent encounter: Secondary | ICD-10-CM | POA: Diagnosis not present

## 2016-10-28 DIAGNOSIS — M810 Age-related osteoporosis without current pathological fracture: Secondary | ICD-10-CM | POA: Diagnosis not present

## 2016-10-28 DIAGNOSIS — S59901D Unspecified injury of right elbow, subsequent encounter: Secondary | ICD-10-CM | POA: Diagnosis not present

## 2016-10-28 DIAGNOSIS — K915 Postcholecystectomy syndrome: Secondary | ICD-10-CM | POA: Diagnosis not present

## 2016-11-01 DIAGNOSIS — I480 Paroxysmal atrial fibrillation: Secondary | ICD-10-CM | POA: Diagnosis not present

## 2016-11-02 ENCOUNTER — Encounter: Payer: Self-pay | Admitting: Pharmacist

## 2016-11-02 DIAGNOSIS — I480 Paroxysmal atrial fibrillation: Secondary | ICD-10-CM | POA: Diagnosis not present

## 2016-11-02 NOTE — Progress Notes (Signed)
Lisa Haynes is a 76 y.o. female who was reviewed for monitoring of apixaban (Eliquis) therapy.    ASSESSMENT Indication(s): atrial fibrillation CHA2DS2VASC score 5, HAS-BLED 1 Duration: indefinite  Labs: Component Value Date/Time   AST 20 11/11/2015 1646   ALT 11 (L) 11/11/2015 1646   NA 136 03/03/2016 1707   K 4.5 03/03/2016 1707   CL 103 03/03/2016 1707   CO2 24 03/03/2016 1707   GLUCOSE 95 03/03/2016 1707   HGBA1C 5.7 01/19/2007 1444   BUN 20 03/03/2016 1707   CREATININE 1.07 (H) 03/03/2016 1707   CREATININE 0.77 08/20/2014 1158   CALCIUM 9.9 03/03/2016 1707   GFRNONAA 49 (L) 03/03/2016 1707   GFRNONAA 75 08/20/2014 1158   WBC 11.3 (H) 03/03/2016 1707   HGB 15.4 (H) 03/03/2016 1707   HCT 48.6 (H) 03/03/2016 1707   PLT 206 03/03/2016 1707   apixaban (Eliquis) Dose: 5 mg BID  No concerns at this time, pharmacy records indicate consistent refills  Marzetta BoardJennifer Daniil Labarge Clinical Pharmacist  11/02/2016, 4:15 PM

## 2016-11-03 DIAGNOSIS — I480 Paroxysmal atrial fibrillation: Secondary | ICD-10-CM | POA: Diagnosis not present

## 2016-11-04 DIAGNOSIS — I480 Paroxysmal atrial fibrillation: Secondary | ICD-10-CM | POA: Diagnosis not present

## 2016-11-08 DIAGNOSIS — I480 Paroxysmal atrial fibrillation: Secondary | ICD-10-CM | POA: Diagnosis not present

## 2016-11-09 DIAGNOSIS — I480 Paroxysmal atrial fibrillation: Secondary | ICD-10-CM | POA: Diagnosis not present

## 2016-11-10 DIAGNOSIS — I480 Paroxysmal atrial fibrillation: Secondary | ICD-10-CM | POA: Diagnosis not present

## 2016-11-11 DIAGNOSIS — I480 Paroxysmal atrial fibrillation: Secondary | ICD-10-CM | POA: Diagnosis not present

## 2016-11-15 DIAGNOSIS — I480 Paroxysmal atrial fibrillation: Secondary | ICD-10-CM | POA: Diagnosis not present

## 2016-11-16 DIAGNOSIS — I480 Paroxysmal atrial fibrillation: Secondary | ICD-10-CM | POA: Diagnosis not present

## 2016-11-17 DIAGNOSIS — I480 Paroxysmal atrial fibrillation: Secondary | ICD-10-CM | POA: Diagnosis not present

## 2016-11-18 DIAGNOSIS — I480 Paroxysmal atrial fibrillation: Secondary | ICD-10-CM | POA: Diagnosis not present

## 2016-11-18 DIAGNOSIS — R269 Unspecified abnormalities of gait and mobility: Secondary | ICD-10-CM | POA: Diagnosis not present

## 2016-11-18 DIAGNOSIS — I10 Essential (primary) hypertension: Secondary | ICD-10-CM | POA: Diagnosis not present

## 2016-11-18 DIAGNOSIS — R32 Unspecified urinary incontinence: Secondary | ICD-10-CM | POA: Diagnosis not present

## 2016-11-18 DIAGNOSIS — H109 Unspecified conjunctivitis: Secondary | ICD-10-CM | POA: Diagnosis not present

## 2016-11-19 ENCOUNTER — Observation Stay (HOSPITAL_COMMUNITY)
Admission: EM | Admit: 2016-11-19 | Discharge: 2016-11-21 | Disposition: A | Discharge status: Skilled Nursing Facility | Payer: Medicare HMO | Attending: Internal Medicine | Admitting: Internal Medicine

## 2016-11-19 ENCOUNTER — Emergency Department (HOSPITAL_COMMUNITY): Payer: Medicare HMO

## 2016-11-19 ENCOUNTER — Encounter (HOSPITAL_COMMUNITY): Payer: Self-pay | Admitting: Nurse Practitioner

## 2016-11-19 DIAGNOSIS — M1990 Unspecified osteoarthritis, unspecified site: Secondary | ICD-10-CM | POA: Insufficient documentation

## 2016-11-19 DIAGNOSIS — L8990 Pressure ulcer of unspecified site, unspecified stage: Secondary | ICD-10-CM | POA: Insufficient documentation

## 2016-11-19 DIAGNOSIS — K219 Gastro-esophageal reflux disease without esophagitis: Secondary | ICD-10-CM | POA: Diagnosis not present

## 2016-11-19 DIAGNOSIS — L309 Dermatitis, unspecified: Secondary | ICD-10-CM | POA: Diagnosis not present

## 2016-11-19 DIAGNOSIS — F419 Anxiety disorder, unspecified: Secondary | ICD-10-CM | POA: Insufficient documentation

## 2016-11-19 DIAGNOSIS — I10 Essential (primary) hypertension: Secondary | ICD-10-CM | POA: Diagnosis not present

## 2016-11-19 DIAGNOSIS — R2689 Other abnormalities of gait and mobility: Secondary | ICD-10-CM | POA: Insufficient documentation

## 2016-11-19 DIAGNOSIS — T148XXA Other injury of unspecified body region, initial encounter: Secondary | ICD-10-CM | POA: Diagnosis not present

## 2016-11-19 DIAGNOSIS — I480 Paroxysmal atrial fibrillation: Secondary | ICD-10-CM | POA: Diagnosis not present

## 2016-11-19 DIAGNOSIS — Z79899 Other long term (current) drug therapy: Secondary | ICD-10-CM | POA: Diagnosis not present

## 2016-11-19 DIAGNOSIS — E7800 Pure hypercholesterolemia, unspecified: Secondary | ICD-10-CM | POA: Diagnosis not present

## 2016-11-19 DIAGNOSIS — S32591A Other specified fracture of right pubis, initial encounter for closed fracture: Principal | ICD-10-CM | POA: Insufficient documentation

## 2016-11-19 DIAGNOSIS — M25551 Pain in right hip: Secondary | ICD-10-CM | POA: Diagnosis not present

## 2016-11-19 DIAGNOSIS — E785 Hyperlipidemia, unspecified: Secondary | ICD-10-CM | POA: Diagnosis not present

## 2016-11-19 DIAGNOSIS — F329 Major depressive disorder, single episode, unspecified: Secondary | ICD-10-CM | POA: Insufficient documentation

## 2016-11-19 DIAGNOSIS — S79911A Unspecified injury of right hip, initial encounter: Secondary | ICD-10-CM | POA: Diagnosis not present

## 2016-11-19 DIAGNOSIS — S3993XA Unspecified injury of pelvis, initial encounter: Secondary | ICD-10-CM | POA: Diagnosis not present

## 2016-11-19 DIAGNOSIS — Z7901 Long term (current) use of anticoagulants: Secondary | ICD-10-CM | POA: Insufficient documentation

## 2016-11-19 DIAGNOSIS — W19XXXA Unspecified fall, initial encounter: Secondary | ICD-10-CM | POA: Diagnosis not present

## 2016-11-19 DIAGNOSIS — S32599A Other specified fracture of unspecified pubis, initial encounter for closed fracture: Secondary | ICD-10-CM | POA: Diagnosis present

## 2016-11-19 LAB — CBC WITH DIFFERENTIAL/PLATELET
Basophils Absolute: 0 10*3/uL (ref 0.0–0.1)
Basophils Relative: 0 %
Eosinophils Absolute: 0.2 10*3/uL (ref 0.0–0.7)
Eosinophils Relative: 3 %
HCT: 40.1 % (ref 36.0–46.0)
Hemoglobin: 12.7 g/dL (ref 12.0–15.0)
Lymphocytes Relative: 33 %
Lymphs Abs: 3.1 10*3/uL (ref 0.7–4.0)
MCH: 29.5 pg (ref 26.0–34.0)
MCHC: 31.7 g/dL (ref 30.0–36.0)
MCV: 93 fL (ref 78.0–100.0)
Monocytes Absolute: 0.8 10*3/uL (ref 0.1–1.0)
Monocytes Relative: 9 %
Neutro Abs: 5.1 10*3/uL (ref 1.7–7.7)
Neutrophils Relative %: 55 %
Platelets: 160 10*3/uL (ref 150–400)
RBC: 4.31 MIL/uL (ref 3.87–5.11)
RDW: 14.4 % (ref 11.5–15.5)
WBC: 9.3 10*3/uL (ref 4.0–10.5)

## 2016-11-19 LAB — BASIC METABOLIC PANEL
Anion gap: 7 (ref 5–15)
BUN: 14 mg/dL (ref 6–20)
CO2: 26 mmol/L (ref 22–32)
Calcium: 9.2 mg/dL (ref 8.9–10.3)
Chloride: 108 mmol/L (ref 101–111)
Creatinine, Ser: 0.78 mg/dL (ref 0.44–1.00)
GFR calc Af Amer: >60 mL/min (ref >60)
GFR calc non Af Amer: >60 mL/min (ref >60)
Glucose, Bld: 99 mg/dL (ref 65–99)
Potassium: 3.8 mmol/L (ref 3.5–5.1)
Sodium: 141 mmol/L (ref 135–145)

## 2016-11-19 MED ORDER — MORPHINE SULFATE (PF) 4 MG/ML IV SOLN
4.0000 mg | Freq: Once | INTRAVENOUS | Status: AC
Start: 2016-11-19 — End: 2016-11-19
  Administered 2016-11-19: 4 mg via INTRAVENOUS
  Filled 2016-11-19: qty 1

## 2016-11-19 MED ORDER — SODIUM CHLORIDE 0.9 % IV SOLN
Freq: Once | INTRAVENOUS | Status: AC
  Administered 2016-11-19: 19:00:00 via INTRAVENOUS

## 2016-11-19 MED ORDER — MORPHINE SULFATE (PF) 4 MG/ML IV SOLN
4.0000 mg | Freq: Once | INTRAVENOUS | Status: DC
Start: 2016-11-19 — End: 2016-11-19

## 2016-11-19 NOTE — ED Notes (Signed)
Bed: WA08 Expected date:  Expected time:  Means of arrival:  Comments: EMS-fall 

## 2016-11-19 NOTE — ED Notes (Signed)
Per pt's daughter after pt receives multiple doses of morphine she begins to, "Lose her mind."  Per family, pt begins to have AVH.  Pt and family request that pt not be given ANY MORE MORPHINE.

## 2016-11-19 NOTE — Plan of Care (Signed)
Received a call from Dr Kohut from [facility] regarding patients admission. But after revewing that chart it was noted its a Internal Medicine Residency program patient. Dr Juleen ChinaKohut notified who will call IM service for admission.

## 2016-11-19 NOTE — ED Triage Notes (Signed)
Pt arrived via ems from home with c/o falling last night around 10p. She generally uses a walker but did not last night and today is having severe right sided hip pain. Per ems no obvious injury. No shortening, bruising, or pain with rotation. No LOC, does take blood thinners (Eliquis). Pt reports she did not hit head just pretty much fell to the ground and tried to break fall. Her son helped her up and pain has worsened today. VS: 181/96, 80, 18, 99%RA NSR. Pain 10/10. She is A&O x4.

## 2016-11-19 NOTE — ED Provider Notes (Signed)
[facility]-EMERGENCY DEPT Provider Note   CSN: [id number] Arrival date & time: 11/19/16  1702  By signing my name below, I, Rosario AdieWilliam Andrew Hiatt, attest that this documentation has been prepared under the direction and in the presence of Raeford RazorKohut, Maesyn Frisinger, MD. Electronically Signed: Rosario AdieWilliam Andrew Hiatt, ED Scribe. 11/19/16. 6:01 PM.  History   Chief Complaint Chief Complaint  Patient presents with  . Hip Pain  . Fall   The history is provided by the patient. No language interpreter was used.    HPI Comments: Lisa Haynes is a 76 y.o. female BIB EMS, with a PMHx of osteoarthritis, HTN, PAF, who presents to the Emergency Department complaining of sudden onset, persistent right hip s/p ground-level, mechanical fall which occurred last night. Pt reports that she was getting out of a chair last night when she turned suddenly and lost her footing, causing her to fall onto her right side. No LOC, head injury, or other reported injuries. She states that initially she wanted to wait to see if her pain would improve before coming into the ED, but it has continued to worsen since onset. She typically ambulates with a walker; however, she has been minimally ambulatory since her fall 2/t this severely worsening her pain. No noted treatments were tried prior to coming into the ED. She is currently on Eliquis daily. She denies chest pain, shortness of breath, or any other associated symptoms.    Past Medical History:  Diagnosis Date  . Depression   . GERD (gastroesophageal reflux disease)   . High cholesterol   . Hypertension   . Osteoarthritis    "legs" (11/11/2015)  . Osteoporosis   . Paroxysmal atrial fibrillation (HCC) 08/20/2014  . Urinary incontinence    Patient Active Problem List   Diagnosis Date Noted  . Hallucinations 09/12/2016  . Unsteady gait 05/29/2016  . Atrial flutter (HCC) 11/20/2015  . 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 Paroxysmal atrial fibrillation (HCC) 09/25/2015  .  Diverticulosis 06/20/2014  . Preventative health care 10/15/2010  . Hyperlipidemia 01/29/2009  . GERD 04/15/2008  . Depression with anxiety 09/01/2007  . Essential hypertension 07/13/2006  . Osteoporosis 07/13/2006   Past Surgical History:  Procedure Laterality Date  . APPENDECTOMY    . CHOLECYSTECTOMY N/A 10/01/2014   Procedure: LAPAROSCOPIC CHOLECYSTECTOMY ;  Surgeon: Harriette Bouillonhomas Cornett, MD;  Location: [facility] OR;  Service: General;  Laterality: N/A;  . DILATION AND CURETTAGE OF UTERUS     "after I had my boys"  . ERCP N/A 09/27/2014   Procedure: ENDOSCOPIC RETROGRADE CHOLANGIOPANCREATOGRAPHY (ERCP);  Surgeon: Jeani HawkingPatrick Hung, MD;  [facility];  Service: Endoscopy;  Laterality: N/A;  . VAGINAL HYSTERECTOMY     with unilateral oophorectomy   OB History    No data available     Home Medications    Prior to Admission medications   Medication Sig Start Date End Date Taking? Authorizing Provider  acetaminophen (TYLENOL) 325 MG tablet Take 2 tablets (650 mg total) by mouth every 6 (six) hours as needed for pain. 06/29/12   Dow AdolphKazibwe, Richard, MD  alendronate (FOSAMAX) 70 MG tablet Take 1 tablet (70 mg total) by mouth every 7 (seven) days. Take with a full glass of water on an empty stomach. 09/24/16   Thomasene Lotaylor, James, MD  apixaban (ELIQUIS) 5 MG TABS tablet Take 1 tablet (5 mg total) by mouth 2 (two) times daily. 09/24/16   Thomasene Lotaylor, James, MD  Calcium Carbonate-Vitamin D (CALCARB 600/D) 600-400 MG-UNIT tablet Take 1 tablet by mouth 3 (three)  times daily with meals. 10/05/16   Eulah Pont, MD  cholestyramine Lanetta Inch) 4 g packet MIX AND TAKE 1 PACKET BY MOUTH DAILY AS NEEDED 10/05/16   Eulah Pont, MD  metoprolol tartrate (LOPRESSOR) 25 MG tablet Take 0.5 tablets (12.5 mg total) by mouth 2 (two) times daily. 10/04/16 01/02/17  Newman Nip, NP  Multiple Vitamins-Minerals (MULTIVITAMIN WITH MINERALS) tablet Take 1 tablet by mouth daily.    [provider]  nitroGLYCERIN (NITROSTAT) 0.4 MG SL  tablet DISSOLVE 1 TABLET UNDER THE TONGUE EVERY 5 MINUTES AS NEEDED FOR CHEST PAIN 10/05/16   Eulah Pont, MD  simvastatin (ZOCOR) 10 MG tablet Take 1 tablet (10 mg total) by mouth daily. 10/05/16   Eulah Pont, MD   Family History Family History  Problem Relation Age of Onset  . Heart disease Mother        onset 75s  . Heart disease Brother 34   Social History Social History  Substance Use Topics  . Smoking status: Never Smoker  . Smokeless tobacco: Current User    Types: Snuff  . Alcohol use No   Allergies   Morphine and related  Review of Systems Review of Systems  Respiratory: Negative for shortness of breath.   Cardiovascular: Negative for chest pain.  Musculoskeletal: Positive for arthralgias and myalgias.  All other systems reviewed and are negative.  Physical Exam Updated Vital Signs BP (!) 157/61 (BP Location: Left Arm)   Pulse 60   Temp 98.5 F (36.9 C) (Oral)   Resp 20   Wt 199 lb (90.3 kg)   SpO2 99%   BMI 36.99 kg/m   Physical Exam  Constitutional: She appears well-developed and well-nourished.  HENT:  Head: Normocephalic.  Right Ear: External ear normal.  Left Ear: External ear normal.  Nose: Nose normal.  Mouth/Throat: Oropharynx is clear and moist.  Eyes: Conjunctivae are normal. Right eye exhibits no discharge. Left eye exhibits no discharge.  Neck: Normal range of motion.  Cardiovascular: Normal rate, regular rhythm and normal heart sounds.   No murmur heard. Pulmonary/Chest: Effort normal and breath sounds normal. No respiratory distress. She has no wheezes. She has no rales.  Abdominal: Soft. She exhibits no distension. There is no tenderness. There is no rebound and no guarding.  Musculoskeletal: She exhibits no edema.  RLE mildly externally rotated. No appreciable shortening. Severe pain with attempted ROM of the hip.   Neurological: She is alert. No cranial nerve deficit. Coordination normal.  Skin: Skin is warm and dry. No rash noted. No  erythema. No pallor.  Psychiatric: She has a normal mood and affect. Her behavior is normal.  Nursing note and vitals reviewed.  ED Treatments / Results  DIAGNOSTIC STUDIES: Oxygen Saturation is 100% on RA, normal by my interpretation.   COORDINATION OF CARE: 6:01 PM-Discussed next steps with pt. Pt verbalized understanding and is agreeable with the plan.   Labs (all labs ordered are listed, but only abnormal results are displayed) Labs Reviewed  CBC WITH DIFFERENTIAL/PLATELET  BASIC METABOLIC PANEL    EKG  EKG Interpretation None      Radiology Ct Pelvis Wo Contrast  Result Date: 11/19/2016 CLINICAL DATA:  Fall last night at 10 p.m. Right hip pain. Suspicion for pubic ramus fracture on conventional radiographs. EXAM: CT PELVIS WITHOUT CONTRAST TECHNIQUE: Multidetector CT imaging of the pelvis was performed following the standard protocol without intravenous contrast. COMPARISON:  11/19/2016 and 03/03/2016 FINDINGS: Urinary Tract: Tiny amount of gas in the urinary bladder likely  from recent catheterization. Distal ureters normal. Bowel:  Sigmoid colon diverticulosis, no active diverticulitis. Vascular/Lymphatic: Aortoiliac atherosclerotic vascular disease. Borderline prominent right external iliac lymph node measuring 1.0 cm in short axis on image 53/3, but not changed from 03/03/2016. Similar left-sided lymph node 0.9 cm in short axis, and stable. Reproductive:  Uterus absent.  Adnexa unremarkable. Other:  No supplemental non-categorized findings. Musculoskeletal: CT confirms an acute oblique fracture through the midportion of the right inferior pubic ramus. This typically implies an associated fracture the superior ramus and often of the sacrum, but in this case the expected additional pelvic fractures are not readily visible on CT, as they were not on plain film. No significant presacral edema. Grade 1 degenerative anterolisthesis at L5-S1 without impingement. IMPRESSION: 1. CT  confirms an acute oblique fracture through the midportion of the right inferior pubic ramus. I would expect this to also have fractures through the pubic body or superior ramus, and probably through the sacrum as well, but if such fractures are present (which is likely) then they are nondisplaced and occult by CT. 2. Sigmoid colon diverticulosis. 3.  Aortic Atherosclerosis (ICD10-I70.0). Electronically Signed   By: Gaylyn Rong M.D.   On: 11/19/2016 20:17   Dg Hip Unilat  With Pelvis 2-3 Views Right  Result Date: 11/19/2016 CLINICAL DATA:  Fall last night, right hip and groin pain. EXAM: DG HIP (WITH OR WITHOUT PELVIS) 2-3V RIGHT COMPARISON:  CT pelvis 03/03/2016 FINDINGS: Body habitus reduces diagnostic sensitivity and specificity. Mild irregularity of the inferior pubic ramus on the frontal pelvis view and the separate right hip view, suspicious for a fracture and accordingly raising suspicion for additional occult fractures elsewhere in the bony pelvis such as the superior pubic ramus and/ or sacrum. I do not see a definite fracture involving the right proximal femur. Atherosclerotic vascular calcifications are present. Suture material and clips noted projecting over the pelvis. IMPRESSION: 1. Suspicion for a right inferior pubic ramus fracture, which could be a harbinger of other occult fractures in the bony pelvis. Consider CT of the bony pelvis for further workup. Electronically Signed   By: Gaylyn Rong M.D.   On: 11/19/2016 17:53    Procedures Procedures   Medications Ordered in ED Medications - No data to display  Initial Impression / Assessment and Plan / ED Course  I have reviewed the triage vital signs and the nursing notes.  Pertinent labs & imaging results that were available during my care of the patient were reviewed by me and considered in my medical decision making (see chart for details).     76yF with mechanical fall. R inf pubic ramus fx. WBAT. PRN pain meds.  Family cannot adequately take care of her. Will likely need placement.   Final Clinical Impressions(s) / ED Diagnoses   Final diagnoses:  Closed fracture of right inferior pubic ramus, initial encounter [facility])   New Prescriptions New Prescriptions   No medications on file   I personally preformed the services scribed in my presence. The recorded information has been reviewed is accurate. Raeford Razor, MD.    Raeford Razor, MD 11/20/16 Rich Fuchs

## 2016-11-20 DIAGNOSIS — L8990 Pressure ulcer of unspecified site, unspecified stage: Secondary | ICD-10-CM | POA: Insufficient documentation

## 2016-11-20 DIAGNOSIS — S32591A Other specified fracture of right pubis, initial encounter for closed fracture: Secondary | ICD-10-CM

## 2016-11-20 LAB — MRSA PCR SCREENING: MRSA BY PCR: NEGATIVE

## 2016-11-20 MED ORDER — SIMVASTATIN 10 MG PO TABS
10.0000 mg | ORAL_TABLET | Freq: Every day | ORAL | Status: DC
  Administered 2016-11-20 – 2016-11-21 (×2): 10 mg via ORAL
  Filled 2016-11-20 (×2): qty 1

## 2016-11-20 MED ORDER — TRAMADOL HCL 50 MG PO TABS
50.0000 mg | ORAL_TABLET | Freq: Four times a day (QID) | ORAL | Status: DC | PRN
  Administered 2016-11-20 (×2): 50 mg via ORAL
  Filled 2016-11-20 (×2): qty 1

## 2016-11-20 MED ORDER — NYSTATIN 100000 UNIT/GM EX POWD
Freq: Three times a day (TID) | CUTANEOUS | Status: DC
  Administered 2016-11-20 – 2016-11-21 (×3): via TOPICAL
  Filled 2016-11-20: qty 15

## 2016-11-20 MED ORDER — ENSURE ENLIVE PO LIQD
237.0000 mL | Freq: Two times a day (BID) | ORAL | Status: DC
  Administered 2016-11-20 – 2016-11-21 (×4): 237 mL via ORAL

## 2016-11-20 MED ORDER — TRAMADOL HCL 50 MG PO TABS: 50.0000 mg | ORAL_TABLET | Freq: Four times a day (QID) | ORAL | Status: DC | PRN

## 2016-11-20 MED ORDER — APIXABAN 5 MG PO TABS
5.0000 mg | ORAL_TABLET | Freq: Two times a day (BID) | ORAL | Status: DC
  Administered 2016-11-20 – 2016-11-21 (×3): 5 mg via ORAL
  Filled 2016-11-20 (×3): qty 1

## 2016-11-20 MED ORDER — ADULT MULTIVITAMIN W/MINERALS CH
1.0000 | ORAL_TABLET | Freq: Every day | ORAL | Status: DC
  Administered 2016-11-20 – 2016-11-21 (×2): 1 via ORAL
  Filled 2016-11-20 (×2): qty 1

## 2016-11-20 MED ORDER — METOPROLOL TARTRATE 12.5 MG HALF TABLET
12.5000 mg | ORAL_TABLET | Freq: Two times a day (BID) | ORAL | Status: DC
  Administered 2016-11-21: 12.5 mg via ORAL
  Filled 2016-11-20: qty 1

## 2016-11-20 MED ORDER — CALCIUM CARBONATE-VITAMIN D 500-200 MG-UNIT PO TABS
1.0000 | ORAL_TABLET | Freq: Three times a day (TID) | ORAL | Status: DC
  Administered 2016-11-20 – 2016-11-21 (×4): 1 via ORAL
  Filled 2016-11-20 (×5): qty 1

## 2016-11-20 MED ORDER — ACETAMINOPHEN 500 MG PO TABS
1000.0000 mg | ORAL_TABLET | Freq: Four times a day (QID) | ORAL | Status: DC | PRN
  Administered 2016-11-20 – 2016-11-21 (×3): 1000 mg via ORAL
  Filled 2016-11-20 (×3): qty 2

## 2016-11-20 MED ORDER — POLYETHYLENE GLYCOL 3350 17 G PO PACK: 17.0000 g | PACK | Freq: Every day | ORAL | Status: DC | PRN

## 2016-11-20 NOTE — Progress Notes (Signed)
   Subjective: Patient was resting comfortably in her bed upon entering the room this morning. She stated that she was told that she fell but did not specifically recall falling or what led up to the fall. Pain was not a concern this morning except with regard to the rash/pressure injury of her right lower quadrant underneath her panniculus. She denied headache, chest pain, abdominal pain, diarrhea or constipation. She was made aware of the plan to consult orthopedics and to begin physical therapy.   Objective:  Vital signs in last 24 hours: Vitals:   11/20/16 0030 11/20/16 0155 11/20/16 0510 11/20/16 0746  BP: 127/67 (!) 156/62 (!) 131/104   Pulse:  62 63   Resp: 15 20 18    Temp:  97.9 F (36.6 C) (!) 97.5 F (36.4 C) 97.7 F (36.5 C)  TempSrc:  Oral Oral Oral  SpO2:  97% 97%   Weight:  194 lb 3.2 oz (88.1 kg)     ROS limited due to patients mental status, see subjective.  Physical Exam  Constitutional: She appears well-developed and well-nourished. No distress.  HENT:  Head: Normocephalic and atraumatic.  Neck: Normal range of motion. Neck supple.  Cardiovascular: Normal rate and intact distal pulses.   No murmur heard. Pulmonary/Chest: Effort normal and breath sounds normal. No respiratory distress. She has no wheezes.  Abdominal: Soft. Bowel sounds are normal. She exhibits no distension. There is no tenderness.  Musculoskeletal: She exhibits tenderness.  Neurological: She is alert.  Skin: Skin is warm and dry. Capillary refill takes less than 2 seconds. Rash (5x5cm area of erythema and epidermal layer lesion consistent with topical fungal infection.) noted. She is not diaphoretic.  Psychiatric: She has a normal mood and affect.   Assessment/Plan:  Active Problems:   Inferior pubic ramus fracture (HCC)   Pubic ramus fracture (HCC)   Pressure injury of skin  Ramus Fracture: -Ortho consulted, will see patient 7/22 in am -PT/OT consulted, will recommend weight bearing as  tolerated as per Ortho verbal recs -Pain control with tramadol 50mg  q6 PRN, patient has received 2 doses to date -please continue PT/OT tomorrow  Pressure injury of skin: -Nystatin powder ordered  -continue to monitor   Atrial Fibrillation: -Will continue home dose metoprolol 12.5mg  BID and eliquis 5mg  daily for anticoag  Dispo: Anticipated discharge in approximately 2 day(s).   Lisa Haynes, Lisa Eatherly, MD 11/20/2016, 2:22 PM Pager: Pager# [phone]

## 2016-11-20 NOTE — Progress Notes (Signed)
Initial Nutrition Assessment  DOCUMENTATION CODES:   Not applicable  INTERVENTION:  - Continue Ensure Enlive BID, each supplement provides 350 kcal and 20 grams of protein - Continue to encourage PO intakes of meals and supplements. - RD will continue to monitor for additional needs.  NUTRITION DIAGNOSIS:   Increased nutrient needs related to wound healing (stage 1 sacral injury) as evidenced by estimated needs.  GOAL:   Patient will meet greater than or equal to 90% of their needs  MONITOR:   PO intake, Supplement acceptance, Weight trends, Labs, Skin  REASON FOR ASSESSMENT:   Malnutrition Screening Tool  ASSESSMENT:   76 y.o. female with history of OA, atrial fibrillation on Eliquis, HTN, anxiety, and depression who presents as a transfer from [facility] for an acute inferior pubic ramus fracture. Of note, patient is a very poor historian and was unaccompanied when seen. States she usually uses a walker at home, but has been falling a lot recently. Per ED note, she fell on 7/20 while trying to get out of a chair, she turned suddenly and lost her footing. No head injury or loss of consciousness. She immediately experienced pain in her R hip. Her pain has continued to worsen since then, especially with movement of R leg.  Pt seen for MST. BMI indicates obesity. Pt is alert and oriented x1 with no family/visitors present. Per review, pt consumed 80% of breakfast this AM (she consumed ~700 kcal and 24 grams of protein for this meal). Lunch tray was delivered ~2 hours ago but no intake from this meal documented. Pt is currently ordered Ensure Enlive BID and accepted morning dose of this supplement.  Per H&P from this AM, pt is stable for d/c to SNF once a bed is available. PT saw pt late this AM and is recommending SNF.  Physical assessment shows no muscle or fat wasting. Per chart review, pt has lost 5 lbs (2.5% body weight) in the past 1 month; not significant for time frame.    Medications reviewed; 1 tablet Oscal with D TID, daily multivitamin with minerals.  Labs reviewed.   IVF: NS @ 100 mL/hr.    Diet Order:  Diet regular Room service appropriate? Yes; Fluid consistency: Thin  Skin:  Wound (see comment) (Stage 1 sacral pressure injury)  Last BM:  7/20  Height:   Ht Readings from Last 1 Encounters:  10/04/16 5' 1.5" (1.562 m)    Weight:   Wt Readings from Last 1 Encounters:  11/20/16 194 lb 3.2 oz (88.1 kg)    Ideal Body Weight:  48.86 kg  BMI:  Body mass index is 36.1 kg/m.  Estimated Nutritional Needs:   Kcal:  1500-1760 (17-20 kcal/kg)  Protein:  70-80 grams  Fluid:  >/= 1.6 L/day  EDUCATION NEEDS:   No education needs identified at this time    Trenton GammonJessica Niguel Moure, MS, RD, LDN, CNSC Inpatient Clinical Dietitian Pager # [phone] After hours/weekend pager # [phone]

## 2016-11-20 NOTE — H&P (Signed)
Date: 11/20/2016               Patient Name:  Lisa Haynes MRN: [id number]  DOB: 09/24/1940 Age / Sex: 76 y.o., female   PCP: Eulah Pont, MD         Medical Service: Internal Medicine Teaching Service         Attending Physician: Dr. Earl Lagos, MD    First Contact: Dr. Crista Elliot  Pager: [phone]  Second Contact: Dr. Loney Loh  Pager: [phone]       After Hours (After 5p/  First Contact Pager: [phone]  weekends / holidays): Second Contact Pager: [phone]   Chief Complaint: R pelvic fracture   History of Present Illness: Lisa Haynes is a 76 y.o. female with history of OA, atrial fibrillation on Eliquis, HTN, anxiety, and depression who presents as a transfer from [facility] for an acute inferior pubic ramus fracture. Of note, patient is a very poor historian and was unaccompanied when seen. States she usually uses a walker at home, but has been falling a lot recently. Per ED note, she fell on 7/20 while trying to get out of a chair, she turned suddenly and lost her footing. No head injury or loss of consciousness. She immediately experienced pain in her R hip. Her pain has continued to worsen since then, especially with movement of R leg. Per note, patient did not try anything for the pain. Denied any other complaints. ED course unremarkable.   Meds:  Current Meds  Medication Sig  . alendronate (FOSAMAX) 70 MG tablet Take 1 tablet (70 mg total) by mouth every 7 (seven) days. Take with a full glass of water on an empty stomach.  Marland Kitchen apixaban (ELIQUIS) 5 MG TABS tablet Take 1 tablet (5 mg total) by mouth 2 (two) times daily.  . Calcium Carbonate-Vitamin D (CALCARB 600/D) 600-400 MG-UNIT tablet Take 1 tablet by mouth 3 (three) times daily with meals.  . cholestyramine (QUESTRAN) 4 g packet MIX AND TAKE 1 PACKET BY MOUTH DAILY AS NEEDED (Patient taking differently: MIX AND TAKE 1 PACKET BY MOUTH DAILY AS NEEDED FOR DIARRHEA)  . metoprolol tartrate (LOPRESSOR) 25 MG tablet Take  0.5 tablets (12.5 mg total) by mouth 2 (two) times daily.  . Multiple Vitamins-Minerals (MULTIVITAMIN WITH MINERALS) tablet Take 1 tablet by mouth daily.  . simvastatin (ZOCOR) 10 MG tablet Take 1 tablet (10 mg total) by mouth daily.     Allergies: Allergies as of 11/19/2016 - Review Complete 11/19/2016  Allergen Reaction Noted  . Morphine and related Other (See Comments) 03/03/2016   Past Medical History:  Diagnosis Date  . Depression   . GERD (gastroesophageal reflux disease)   . High cholesterol   . Hypertension   . Osteoarthritis    "legs" (11/11/2015)  . Osteoporosis   . Paroxysmal atrial fibrillation (HCC) 08/20/2014  . Urinary incontinence    Past Surgical History:  Procedure Laterality Date  . APPENDECTOMY    . CHOLECYSTECTOMY N/A 10/01/2014   Procedure: LAPAROSCOPIC CHOLECYSTECTOMY ;  Surgeon: Harriette Bouillon, MD;  Location: [facility] OR;  Service: General;  Laterality: N/A;  . DILATION AND CURETTAGE OF UTERUS     "after I had my boys"  . ERCP N/A 09/27/2014   Procedure: ENDOSCOPIC RETROGRADE CHOLANGIOPANCREATOGRAPHY (ERCP);  Surgeon: Jeani Hawking, MD;  Location: [facility] ENDOSCOPY;  Service: Endoscopy;  Laterality: N/A;  . VAGINAL HYSTERECTOMY     with unilateral oophorectomy    Family History:  Family History  Problem  Relation Age of Onset  . Heart disease Mother        onset 6680s  . Heart disease Brother 4973    Social History:  Social History   Social History  . Marital status: Widowed    Spouse name: N/A  . Number of children: N/A  . Years of education: N/A   Occupational History  . Retired    Social History Main Topics  . Smoking status: Never Smoker  . Smokeless tobacco: Current User    Types: Snuff  . Alcohol use No  . Drug use: No  . Sexual activity: No   Other Topics Concern  . None   Social History Narrative   Retired: Motel work   Divorced    Regular exercise- no   Lives alone    Review of Systems: A complete ROS was negative except as per  HPI.   Physical Exam: Blood pressure 127/67, pulse (!) 57, temperature 98.5 F (36.9 C), temperature source Oral, resp. rate 15, weight 199 lb (90.3 kg), SpO2 97 %.  General: pleasant, somewhat confused, lying in bed in no acute distress  HENT: NCAT, neck supple and FROM Eyes: anicteric sclera, PERRL Cardiac: regular rate and rhythm, nl S1/S2, no murmurs, rubs or gallops  Pulm: CTAB, no wheezes or crackles, no increased work of breathing  Abd: soft, NTND, bowel sounds present Neuro: alert and oriented x2, unable to test ROM on R leg secondary to pain, but patient is able to lift leg and wiggle toes, no abnormalities on L lower extremity  Ext: R lower extremity cool to the touch and mild external rotation, no shortening appreciated, 1+ peripheral edema, 1`+ DP pulses on the R  Derm: no skin findings   Labs:  CBC WBC 9.3 H/H 12.7/40.1 Plt 160 BMP 141  3.8  108  26  14  0.78  99   EKG: none  XR R hip: IMPRESSION: 1. Suspicion for a right inferior pubic ramus fracture, which could be a harbinger of other occult fractures in the bony pelvis.  CT Pelvis: IMPRESSION: 1. CT confirms an acute oblique fracture through the midportion of the right inferior pubic ramus. I would expect this to also have fractures through the pubic body or superior ramus, and probably through the sacrum as well, but if such fractures are present (which is likely) then they are nondisplaced and occult by CT. 2. Sigmoid colon diverticulosis. 3.  Aortic Atherosclerosis   Assessment & Plan by Problem:  Lisa Haynes is a 76 y.o. female with history of OA, atrial fibrillation on Eliquis, HTN, anxiety, and depression who presents as a transfer from [facility] for an acute inferior pubic ramus fracture.   # R inferior pubic ramus fracture after mechanical fall: Will manage with pain control and order PT/OT for early mobility. Will need SNF placement as family is unable to take care of her.  - Pain control: Tylenol  1000mg  q6h PRN + tramadol 50mg  q6h PRN. Per family, patient unable to have morphine due to worsening of mental status.   - PT/OT consult  - Care management consult. Will need SNF placement.  - Continue home calcium-vitamin D supplement + MVI   # Atrial fibrillation:  - Continue home metoprolol 12.5mg  BID and Eliquis 5mg  daily   #HLD:  - Continue home simvastatin 10mg  daily   IVF: none  Diet: regular DVT ppx: home eliquis  Code status: Full code, not confirmed on admission due to poor mentation. Will address in  the morning.   Dispo: Admit patient to Inpatient with expected length of stay greater than 2 midnights.  Signed: Burna Cash, MD  Internal Medicine PGY-1  P [phone] 11/20/2016, 1:53 AM

## 2016-11-20 NOTE — Evaluation (Signed)
Physical Therapy Evaluation Patient Details Name: Lisa Haynes MRN: [id number] DOB: [birth year] Today's Date: 11/20/2016   History of Present Illness  76 yo admitted after fall with right inferior pubic rami fx. PMhx: HTN, Afib, OA, anxiety  Clinical Impression  Pt pleasant, confused and oriented only to self. Pt without weight bearing order despite having paged and spoken to internal medicine, therefore assuming conservative status of limited weight bearing. Pt with decreased strength, balance, function, mobility without 24hr care at home, history of falls and will benefit from acute therapy to maximize mobility, function and strength to decrease burden of care.     Follow Up Recommendations SNF;Supervision/Assistance - 24 hour    Equipment Recommendations  Wheelchair cushion (measurements PT);Wheelchair (measurements PT)    Recommendations for Other Services OT consult     Precautions / Restrictions Precautions Precautions: Fall Restrictions Weight Bearing Restrictions: No      Mobility  Bed Mobility Overal bed mobility: Needs Assistance Bed Mobility: Supine to Sit     Supine to sit: Min assist     General bed mobility comments: with rail, cues for sequence, assist to rise and increased time  Transfers Overall transfer level: Needs assistance   Transfers: Sit to/from Stand;Stand Pivot Transfers Sit to Stand: Min assist Stand pivot transfers: Mod assist       General transfer comment: pt with cues for hand placement and sequence, unable to maintain NWB as no weight bearing order given, unable to follow commands for squat pivot  Ambulation/Gait             General Gait Details: unable  Stairs            Wheelchair Mobility    Modified Rankin (Stroke Patients Only)       Balance Overall balance assessment: History of Falls                                           Pertinent Vitals/Pain Pain Assessment:  (pt unable to rate,  painful right hip)    Home Living Family/patient expects to be discharged to:: Private residence Living Arrangements: Children Available Help at Discharge: Family;Available PRN/intermittently Type of Home: Apartment       Home Layout: One level Home Equipment: Walker - 4 wheels;Cane - quad;Shower seat      Prior Function Level of Independence: Needs assistance   Gait / Transfers Assistance Needed: walks with rollator  ADL's / Homemaking Assistance Needed: sponge bath with assist, assist for dressing, toileting mod I per caregiver, does not cook or clean  Comments: lives with son who works, Engineer, production a few hrs a day     Higher education careers adviser        Extremity/Trunk Assessment   Upper Extremity Assessment Upper Extremity Assessment: Generalized weakness    Lower Extremity Assessment Lower Extremity Assessment: Generalized weakness    Cervical / Trunk Assessment Cervical / Trunk Assessment: Kyphotic  Communication   Communication: No difficulties  Cognition Arousal/Alertness: Awake/alert Behavior During Therapy: WFL for tasks assessed/performed Overall Cognitive Status: Impaired/Different from baseline Area of Impairment: Orientation;Attention;Memory;Following commands;Safety/judgement                 Orientation Level: Disoriented to;Time;Place;Situation Current Attention Level: Sustained Memory: Decreased short-term memory Following Commands: Follows one step commands consistently Safety/Judgement: Decreased awareness of safety;Decreased awareness of deficits  General Comments      Exercises     Assessment/Plan    PT Assessment Patient needs continued PT services  PT Problem List Decreased strength;Decreased mobility;Decreased safety awareness;Decreased range of motion;Decreased activity tolerance;Decreased balance;Decreased knowledge of use of DME;Pain;Decreased cognition       PT Treatment Interventions Gait training;Patient/family  education;Therapeutic exercise;DME instruction;Therapeutic activities;Functional mobility training;Balance training;Cognitive remediation    PT Goals (Current goals can be found in the Care Plan section)  Acute Rehab PT Goals Patient Stated Goal: be able to walk PT Goal Formulation: With patient Time For Goal Achievement: 12/04/16 Potential to Achieve Goals: Fair    Frequency Min 3X/week   Barriers to discharge Decreased caregiver support      Co-evaluation               AM-PAC PT "6 Clicks" Daily Activity  Outcome Measure Difficulty turning over in bed (including adjusting bedclothes, sheets and blankets)?: A Little Difficulty moving from lying on back to sitting on the side of the bed? : Total Difficulty sitting down on and standing up from a chair with arms (e.g., wheelchair, bedside commode, etc,.)?: Total Help needed moving to and from a bed to chair (including a wheelchair)?: Total Help needed walking in hospital room?: Total Help needed climbing 3-5 steps with a railing? : Total 6 Click Score: 8    End of Session Equipment Utilized During Treatment: Gait belt Activity Tolerance: Patient tolerated treatment well Patient left: in chair;with chair alarm set;with call bell/phone within reach;with family/visitor present Nurse Communication: Mobility status PT Visit Diagnosis: Other abnormalities of gait and mobility (R26.89);History of falling (Z91.81);Pain Pain - Right/Left: Right Pain - part of body: Hip    Time: [id number] PT Time Calculation (min) (ACUTE ONLY): 16 min   Charges:   PT Evaluation $PT Eval Moderate Complexity: 1 Procedure     PT G Codes:        Delaney MeigsMaija Tabor Jarrah Babich, PT [phone]   Katlin Bortner B Teighlor Korson 11/20/2016, 11:45 AM

## 2016-11-20 NOTE — Evaluation (Signed)
Occupational Therapy Evaluation Patient Details Name: Lisa Haynes MRN: [id number] DOB: [birth year] Today's Date: 11/20/2016    History of Present Illness 76 yo admitted after fall with right inferior pubic rami fx. PMhx: HTN, Afib, OA, anxiety   Clinical Impression   PTA, pt required assistance from aide and family for dressing and bathing tasks but was able to complete toileting with modified independence. Pt presents with confusion this session and currently requires total assist for LB ADL and mod assist for stand-pivot toilet transfers. She would benefit from continued OT services while admitted in order to improve independence with ADL and functional mobility. Pt demonstrates significant functional decline from PLOF and feel pt would best benefit from SNF level rehabilitation to maximize independence and safety with ADL prior to returning home with assistance from family and aide. OT will continue to follow while admitted.    Follow Up Recommendations  SNF;Supervision/Assistance - 24 hour    Equipment Recommendations  Other (comment) (TBD at next venue of care)    Recommendations for Other Services       Precautions / Restrictions Precautions Precautions: Fall Restrictions Weight Bearing Restrictions: Yes RLE Weight Bearing: Weight bearing as tolerated Other Position/Activity Restrictions: WBAT per internal medicine progress note 11/20/16.      Mobility Bed Mobility               General bed mobility comments: Pt OOB in chair on arrival.   Transfers Overall transfer level: Needs assistance Equipment used: Rolling walker (2 wheeled);None Transfers: Sit to/from [employer] Sit to Stand: Mod assist Stand pivot transfers: Mod assist       General transfer comment: Pt able to complete stand-pivot toilet transfers with Mod assist overall. Difficulty pivoting on L LE.     Balance Overall balance assessment: History of Falls                                          ADL either performed or assessed with clinical judgement   ADL Overall ADL's : Needs assistance/impaired Eating/Feeding: Supervision/ safety;Sitting   Grooming: Supervision/safety;Sitting   Upper Body Bathing: Min guard;Sitting   Lower Body Bathing: Total assistance;Sit to/from stand   Upper Body Dressing : Min guard;Sitting   Lower Body Dressing: Total assistance;Sit to/from stand   Toilet Transfer: Moderate assistance;BSC;Stand-pivot Toilet Transfer Details (indicate cue type and reason): To the L Toileting- Clothing Manipulation and Hygiene: Moderate assistance;Sit to/from stand       Functional mobility during ADLs: Moderate assistance (squat-pivot only) General ADL Comments: Per internal medicine note pt WBAT on R hip.      Vision   Vision Assessment?: No apparent visual deficits     Perception     Praxis      Pertinent Vitals/Pain Pain Assessment: Faces Faces Pain Scale: Hurts even more Pain Location: R hip with transfer Pain Intervention(s): Limited activity within patient's tolerance;Monitored during session;Repositioned     Hand Dominance     Extremity/Trunk Assessment Upper Extremity Assessment Upper Extremity Assessment: Generalized weakness   Lower Extremity Assessment Lower Extremity Assessment: RLE deficits/detail RLE Deficits / Details: Decreased strength and increased pain due to pubic rami fracture.    Cervical / Trunk Assessment Cervical / Trunk Assessment: Kyphotic   Communication Communication Communication: No difficulties   Cognition Arousal/Alertness: Awake/alert Behavior During Therapy: WFL for tasks assessed/performed Overall Cognitive Status: Impaired/Different from baseline Area of Impairment:  Orientation;Attention;Memory;Following commands;Safety/judgement                 Orientation Level: Disoriented to;Situation Current Attention Level: Sustained Memory: Decreased short-term  memory Following Commands: Follows one step commands consistently Safety/Judgement: Decreased awareness of safety;Decreased awareness of deficits     General Comments: Pt able to follow commands well but with significantly decreased short-term memory frequently repeating questions with therapist.    General Comments       Exercises     Shoulder Instructions      Home Living Family/patient expects to be discharged to:: Private residence Living Arrangements: Children Available Help at Discharge: Family;Available PRN/intermittently Type of Home: Apartment       Home Layout: One level     Bathroom Shower/Tub: Chief Strategy OfficerTub/shower unit   Bathroom Toilet: Standard     Home Equipment: Environmental consultantWalker - 4 wheels;Cane - quad;Shower seat          Prior Functioning/Environment Level of Independence: Needs assistance  Gait / Transfers Assistance Needed: walks with rollator ADL's / Homemaking Assistance Needed: sponge bath with assist, assist for dressing, toileting mod I per caregiver, does not cook or clean   Comments: lives with son who works, Engineer, productionaide a few hrs a day        OT Problem List:        OT Treatment/Interventions:      OT Goals(Current goals can be found in the care plan section) Acute Rehab OT Goals Patient Stated Goal: be able to walk OT Goal Formulation: With patient Time For Goal Achievement: 12/04/16 Potential to Achieve Goals: Good ADL Goals Pt Will Perform Grooming: with min assist;standing Pt Will Perform Upper Body Dressing: with modified independence;sitting Pt Will Perform Lower Body Dressing: with min guard assist;sit to/from stand Pt Will Transfer to Toilet: with min guard assist;ambulating;bedside commode (BSC over toilet) Pt Will Perform Toileting - Clothing Manipulation and hygiene: with min guard assist;sit to/from stand  OT Frequency: Min 2X/week   Barriers to D/C:            Co-evaluation              AM-PAC PT "6 Clicks" Daily Activity      Outcome Measure Help from another person eating meals?: A Little Help from another person taking care of personal grooming?: A Little Help from another person toileting, which includes using toliet, bedpan, or urinal?: A Lot Help from another person bathing (including washing, rinsing, drying)?: A Lot Help from another person to put on and taking off regular upper body clothing?: A Little Help from another person to put on and taking off regular lower body clothing?: A Lot 6 Click Score: 15   End of Session Equipment Utilized During Treatment: Gait belt;Rolling walker Nurse Communication: Mobility status (Nurse tech)  Activity Tolerance: Patient tolerated treatment well Patient left: in chair;with call bell/phone within reach;with chair alarm set  OT Visit Diagnosis: Unsteadiness on feet (R26.81);Muscle weakness (generalized) (M62.81);Other abnormalities of gait and mobility (R26.89);Pain Pain - Right/Left: Right Pain - part of body: Hip                Time: [id number] OT Time Calculation (min): 33 min Charges:  OT General Charges $OT Visit: 1 Procedure OT Evaluation $OT Eval Moderate Complexity: 1 Procedure OT Treatments $Self Care/Home Management : 8-22 mins G-Codes:     Doristine Sectionharity A Davieon Stockham, MS OTR/L  Pager: [phone]   Maisyn Nouri A Cayla Wiegand 11/20/2016, 4:32 PM

## 2016-11-20 NOTE — Consult Note (Signed)
WOC Nurse wound consult note Reason for Consult: Patient with area of erythema (blanching, not pressure) in buttock area.  Moisture associated skin damage, specifically incontinence associated dermatitis.  Also noted is intertriginous dermatitis in the subpannicular skin fold.  MD has ordered antifungal powder to this area to begin this pm. Bilateral heels are floated on pillows and while dry, are intact. Wound type:Moisture Pressure Injury POA: No Measurement: N/A Wound WGN:FAOZbed:None Drainage (amount, consistency, odor) None Periwound: N/A Dressing procedure/placement/frequency: I will provide patient with a pressure redistribution chair cushion that is water resistant, due to her incontinence. Currently the powered urinary management system (PurWick) is in place. Turning and repositioning is in place while in bed and chair per protocol. Skin care guidance is provided for the nursing staff via the Orders. WOC nursing team will not follow, but will remain available to this patient, the nursing and medical teams.  Please re-consult if needed. Thanks, Lisa MowLaurie Markasia Carrol, MSN, RN, GNP, Hans EdenCWOCN, CWON-AP, FAAN  Pager# [phone]

## 2016-11-21 DIAGNOSIS — M1990 Unspecified osteoarthritis, unspecified site: Secondary | ICD-10-CM | POA: Diagnosis not present

## 2016-11-21 DIAGNOSIS — I10 Essential (primary) hypertension: Secondary | ICD-10-CM | POA: Diagnosis not present

## 2016-11-21 DIAGNOSIS — I480 Paroxysmal atrial fibrillation: Secondary | ICD-10-CM | POA: Diagnosis not present

## 2016-11-21 DIAGNOSIS — E785 Hyperlipidemia, unspecified: Secondary | ICD-10-CM | POA: Diagnosis not present

## 2016-11-21 DIAGNOSIS — W19XXXD Unspecified fall, subsequent encounter: Secondary | ICD-10-CM | POA: Diagnosis not present

## 2016-11-21 DIAGNOSIS — I4892 Unspecified atrial flutter: Secondary | ICD-10-CM | POA: Diagnosis not present

## 2016-11-21 DIAGNOSIS — M6281 Muscle weakness (generalized): Secondary | ICD-10-CM | POA: Diagnosis not present

## 2016-11-21 DIAGNOSIS — S32591A Other specified fracture of right pubis, initial encounter for closed fracture: Secondary | ICD-10-CM | POA: Diagnosis not present

## 2016-11-21 DIAGNOSIS — Z7901 Long term (current) use of anticoagulants: Secondary | ICD-10-CM | POA: Diagnosis not present

## 2016-11-21 DIAGNOSIS — F419 Anxiety disorder, unspecified: Secondary | ICD-10-CM | POA: Diagnosis not present

## 2016-11-21 DIAGNOSIS — S3993XA Unspecified injury of pelvis, initial encounter: Secondary | ICD-10-CM | POA: Diagnosis not present

## 2016-11-21 DIAGNOSIS — Z5181 Encounter for therapeutic drug level monitoring: Secondary | ICD-10-CM | POA: Diagnosis not present

## 2016-11-21 DIAGNOSIS — S32509A Unspecified fracture of unspecified pubis, initial encounter for closed fracture: Secondary | ICD-10-CM | POA: Diagnosis not present

## 2016-11-21 DIAGNOSIS — Z79899 Other long term (current) drug therapy: Secondary | ICD-10-CM | POA: Diagnosis not present

## 2016-11-21 DIAGNOSIS — F329 Major depressive disorder, single episode, unspecified: Secondary | ICD-10-CM | POA: Diagnosis not present

## 2016-11-21 DIAGNOSIS — S92153A Displaced avulsion fracture (chip fracture) of unspecified talus, initial encounter for closed fracture: Secondary | ICD-10-CM | POA: Diagnosis not present

## 2016-11-21 DIAGNOSIS — R278 Other lack of coordination: Secondary | ICD-10-CM | POA: Diagnosis not present

## 2016-11-21 DIAGNOSIS — I4891 Unspecified atrial fibrillation: Secondary | ICD-10-CM | POA: Diagnosis not present

## 2016-11-21 DIAGNOSIS — S32591D Other specified fracture of right pubis, subsequent encounter for fracture with routine healing: Secondary | ICD-10-CM | POA: Diagnosis not present

## 2016-11-21 DIAGNOSIS — M810 Age-related osteoporosis without current pathological fracture: Secondary | ICD-10-CM | POA: Diagnosis not present

## 2016-11-21 MED ORDER — TRAMADOL HCL 50 MG PO TABS: 50.0000 mg | ORAL_TABLET | Freq: Two times a day (BID) | ORAL | 0 refills | Status: DC | PRN

## 2016-11-21 MED ORDER — NYSTATIN 100000 UNIT/GM EX POWD: Freq: Three times a day (TID) | CUTANEOUS | 0 refills | Status: DC

## 2016-11-21 NOTE — Consult Note (Signed)
ORTHOPAEDIC CONSULTATION  REQUESTING PHYSICIAN: Aldine Contes, MD  Chief Complaint: Right leg pain after a fall at home.  Assessment / Plan: Active Problems:   Inferior pubic ramus fracture (HCC)   Pubic ramus fracture (HCC)   Pressure injury of skin  Right inferior pubic ramus fracture: Pelvis stable. Mobilize with therapy.   SNF likely to assist w/ mobilization, safe transfers, and ADLs. WBAT RLE.  Follow up in the office with Dr. Alain Marion in 2 weeks.  Please call with questions.   HPI: Lisa Haynes is a 76 y.o. female who complains of right leg pain after a fall in her home onto her right side. She presented to the emergency department where x-rays suggested right inferior pubic rami fracture. CT confirms same. Orthopedics was consulted for evaluation.  Currently the patient's pain is well-controlled. She denies numbness in her extremities.  Previously ambulatory. Her son lives with her in her home.  The details and expected slow recovery of this injury discussed at length with the patient. She verbalizes understanding. The goal will be to mobilize as much as tolerated expecting slow process.  Past Medical History:  Diagnosis Date  . Depression   . GERD (gastroesophageal reflux disease)   . High cholesterol   . Hypertension   . Osteoarthritis    "legs" (11/11/2015)  . Osteoporosis   . Paroxysmal atrial fibrillation ([place]) 08/20/2014  . Urinary incontinence    Past Surgical History:  Procedure Laterality Date  . APPENDECTOMY    . CHOLECYSTECTOMY N/A 10/01/2014   Procedure: LAPAROSCOPIC CHOLECYSTECTOMY ;  Surgeon: Erroll Luna, MD;  Location: [facility];  Service: General;  Laterality: N/A;  . DILATION AND CURETTAGE OF UTERUS     "after I had my boys"  . ERCP N/A 09/27/2014   Procedure: ENDOSCOPIC RETROGRADE CHOLANGIOPANCREATOGRAPHY (ERCP);  Surgeon: Carol Ada, MD;  Location: [facility] ENDOSCOPY;  Service: Endoscopy;  Laterality: N/A;  . VAGINAL HYSTERECTOMY     with unilateral oophorectomy   Social History   Social History  . Marital status: Widowed    Spouse name: N/A  . Number of children: N/A  . Years of education: N/A   Occupational History  . Retired    Social History Main Topics  . Smoking status: Never Smoker  . Smokeless tobacco: Current User    Types: Snuff  . Alcohol use No  . Drug use: No  . Sexual activity: No   Other Topics Concern  . None   Social History Narrative   Retired: Motel work   Divorced    Regular exercise- no   Lives alone   Family History  Problem Relation Age of Onset  . Heart disease Mother        onset 35s  . Heart disease Brother 39   Allergies  Allergen Reactions  . Morphine And Related Other (See Comments)    Drives patient "out of" her "mind"   Prior to Admission medications   Medication Sig Start Date End Date Taking? Authorizing Provider  alendronate (FOSAMAX) 70 MG tablet Take 1 tablet (70 mg total) by mouth every 7 (seven) days. Take with a full glass of water on an empty stomach. 09/24/16  Yes Ophelia Shoulder, MD  apixaban (ELIQUIS) 5 MG TABS tablet Take 1 tablet (5 mg total) by mouth 2 (two) times daily. 09/24/16  Yes Ophelia Shoulder, MD  Calcium Carbonate-Vitamin D (CALCARB 600/D) 600-400 MG-UNIT tablet Take 1 tablet by mouth 3 (three) times daily with meals. 10/05/16  Yes Ledell Noss, MD  cholestyramine (QUESTRAN) 4 g packet [place] 1 PACKET BY MOUTH DAILY AS NEEDED Patient taking differently: MIX AND TAKE 1 PACKET BY MOUTH DAILY AS NEEDED FOR DIARRHEA 10/05/16  Yes Ledell Noss, MD  metoprolol tartrate (LOPRESSOR) 25 MG tablet Take 0.5 tablets (12.5 mg total) by mouth 2 (two) times daily. 10/04/16 01/02/17 Yes Sherran Needs, NP  Multiple Vitamins-Minerals (MULTIVITAMIN WITH MINERALS) tablet Take 1 tablet by mouth daily.   Yes [provider]  simvastatin (ZOCOR) 10 MG tablet Take 1 tablet (10 mg total) by mouth daily. 10/05/16  Yes Ledell Noss, MD  acetaminophen (TYLENOL) 325 MG  tablet Take 2 tablets (650 mg total) by mouth every 6 (six) hours as needed for pain. 06/29/12   Jessee Avers, MD  nitroGLYCERIN (NITROSTAT) 0.4 MG SL tablet DISSOLVE 1 TABLET UNDER THE TONGUE EVERY 5 MINUTES AS NEEDED FOR CHEST PAIN 10/05/16   Ledell Noss, MD   Ct Pelvis Wo Contrast  Result Date: 11/19/2016 CLINICAL DATA:  Fall last night at 10 p.m. Right hip pain. Suspicion for pubic ramus fracture on conventional radiographs. EXAM: CT PELVIS WITHOUT CONTRAST TECHNIQUE: Multidetector CT imaging of the pelvis was performed following the standard protocol without intravenous contrast. COMPARISON:  11/19/2016 and 03/03/2016 FINDINGS: Urinary Tract: Tiny amount of gas in the urinary bladder likely from recent catheterization. Distal ureters normal. Bowel:  Sigmoid colon diverticulosis, no active diverticulitis. Vascular/Lymphatic: Aortoiliac atherosclerotic vascular disease. Borderline prominent right external iliac lymph node measuring 1.0 cm in short axis on image 53/3, but not changed from 03/03/2016. Similar left-sided lymph node 0.9 cm in short axis, and stable. Reproductive:  Uterus absent.  Adnexa unremarkable. Other:  No supplemental non-categorized findings. Musculoskeletal: CT confirms an acute oblique fracture through the midportion of the right inferior pubic ramus. This typically implies an associated fracture the superior ramus and often of the sacrum, but in this case the expected additional pelvic fractures are not readily visible on CT, as they were not on plain film. No significant presacral edema. Grade 1 degenerative anterolisthesis at L5-S1 without impingement. IMPRESSION: 1. CT confirms an acute oblique fracture through the midportion of the right inferior pubic ramus. I would expect this to also have fractures through the pubic body or superior ramus, and probably through the sacrum as well, but if such fractures are present (which is likely) then they are nondisplaced and occult by CT. 2.  Sigmoid colon diverticulosis. 3.  Aortic Atherosclerosis (ICD10-I70.0). Electronically Signed   By: Van Clines M.D.   On: 11/19/2016 20:17   Dg Hip Unilat  With Pelvis 2-3 Views Right  Result Date: 11/19/2016 CLINICAL DATA:  Fall last night, right hip and groin pain. EXAM: DG HIP (WITH OR WITHOUT PELVIS) 2-3V RIGHT COMPARISON:  CT pelvis 03/03/2016 FINDINGS: Body habitus reduces diagnostic sensitivity and specificity. Mild irregularity of the inferior pubic ramus on the frontal pelvis view and the separate right hip view, suspicious for a fracture and accordingly raising suspicion for additional occult fractures elsewhere in the bony pelvis such as the superior pubic ramus and/ or sacrum. I do not see a definite fracture involving the right proximal femur. Atherosclerotic vascular calcifications are present. Suture material and clips noted projecting over the pelvis. IMPRESSION: 1. Suspicion for a right inferior pubic ramus fracture, which could be a harbinger of other occult fractures in the bony pelvis. Consider CT of the bony pelvis for further workup. Electronically Signed   By: Cindra Eves.D.  On: 11/19/2016 17:53    Positive ROS: All other systems have been reviewed and were otherwise negative with the exception of those mentioned in the HPI and as above.  Objective: Labs cbc  Recent Labs  11/19/16 2150  WBC 9.3  HGB 12.7  HCT 40.1  PLT 160    Labs inflam No results for input(s): CRP in the last 72 hours.  Invalid input(s): ESR  Labs coag No results for input(s): INR, PTT in the last 72 hours.  Invalid input(s): PT   Recent Labs  11/19/16 2150  NA 141  K 3.8  CL 108  CO2 26  GLUCOSE 99  BUN 14  CREATININE 0.78  CALCIUM 9.2    Physical Exam: Vitals:   11/21/16 0445 11/21/16 0500  BP: (!) 155/59   Pulse: (!) 58   Resp: 16   Temp: (!) 97.4 F (36.3 C) (!) 97.5 F (36.4 C)   General: Alert, no acute distress. Supine in bed on arrival. Calm,  conversant. Mental status: Alert and Oriented x3 Neurologic: Speech Clear and organized, no gross focal findings or movement disorder appreciated. Respiratory: No cyanosis, no use of accessory musculature Cardiovascular: No pedal edema GI: Abdomen is protuberant, soft and non-tender, non-distended. Skin: Warm and dry.  No lesions in the area of chief complaint. Extremities: Warm  Psychiatric: Patient is competent for consent with normal mood and affect  MUSCULOSKELETAL:  Pain with range of motion right lower extremity. No lesions or ecchymosis. Neurovascularly intact. Compartments soft. Quad/hamstring, dorsiflexion plantar flexion, EHL FHL all firing. Sensation intact distally. Other extremities are atraumatic with painless ROM and NVI.   Prudencio Burly III PA-C 11/21/2016 7:40 AM

## 2016-11-21 NOTE — Clinical Social Work Note (Addendum)
Clinical Social Work Assessment  Patient Details  Name: Lisa Haynes MRN: [id number] Date of Birth: 29-Jul-1940  Date of referral:  11/21/16               Reason for consult:  Facility Placement                Permission sought to share information with:    Permission granted to share information::  Yes, Verbal Permission Granted  Name::        Agency::     Relationship::     Contact Information:     Housing/Transportation Living arrangements for the past 2 months:  Homeless Source of Information:  Patient (Pt and niece) Patient Interpreter Needed:  None Criminal Activity/Legal Involvement Pertinent to Current Situation/Hospitalization:    Significant Relationships:  Adult Children (Lisa Haynes) Lives with:  Self Do you feel safe going back to the place where you live?  Yes Need for family participation in patient care:  Yes (Comment)  Care giving concerns:  None listed by pt/family   Social Worker assessment / plan:  CSW met with pt and confirmed pt's plan to be discharged to SNF to live at discharge.  CSW provided active listening and validated pt's concerns SNF is in [place].  Pt's niece Lisa Haynes states family prefers [place].   CSW will complete FL-2 and send referrals out to SNF facilities via the hub per pt's request.  Pt has been living independently prior to being admitted to [facility].  Employment status:    Insurance informationEducational psychologist PT Recommendations:  [place] / Referral to community resources:     Patient/Family's Response to care:  Patient alert and oriented.  Patient and niece agreeable to plan.  Pt's niece Lisa Haynes supportive and strongly involved in pt.'s care.  Pt.'s niece pleasant and appreciated CSW intervention.    Patient/Family's Understanding of and Emotional Response to Diagnosis, Current Treatment, and Prognosis:  Still assessing  Emotional Assessment Appearance:  Appears stated  age Attitude/Demeanor/Rapport:    Affect (typically observed):  Accepting, Adaptable, Calm, Pleasant Orientation:  Oriented to Self Alcohol / Substance use:    Psych involvement (Current and /or in the community):     Discharge Needs  Concerns to be addressed:  No discharge needs identified Readmission within the last 30 days:  No Current discharge risk:  None Barriers to Discharge:  No Barriers Identified   Claudine Mouton, LCSWA 11/21/2016, 11:28 AM

## 2016-11-21 NOTE — Care Management CC44 (Signed)
Condition Code 44 Documentation Completed  Patient Details  Name: Lucilla Edinatsy J Cromley MRN: [id number] Date of Birth: Jan 23, 1941   Condition Code 44 given:  Yes Patient signature on Condition Code 44 notice:  Yes Documentation of 2 MD's agreement:  Yes Code 44 added to claim:  Yes    Yvone NeuCrutchfield, Yavier Snider M, RN 11/21/2016, 12:15 PM

## 2016-11-21 NOTE — Progress Notes (Signed)
Pt spoke to Toniann FailWendy at [facility] who stated pt has a bed offer but Toniann FailWendy requested CSW call Wellsite geologistmedical director to verify LOG is appropriate for [facility].  Dorothe PeaJonathan F. Chandel Zaun, Francesco SorLCSWA, LCAS, CSI Clinical Social Worker Ph: [phone]

## 2016-11-21 NOTE — Discharge Summary (Signed)
Name: Lisa Haynes MRN: [id number] DOB: December 18, 1940 76 y.o. PCP: [name], Nina, MD  Date of Admission: 11/19/2016  5:08 PM Date of Discharge: 11/21/2016 Attending Physician: Earl LagosNarendra, Nischal, MD  Discharge Diagnosis: Active Problems:   Inferior pubic ramus fracture (HCC)   Pubic ramus fracture (HCC)   Pressure injury of skin   Discharge Medications: Allergies as of 11/21/2016      Reactions   Morphine And Related Other (See Comments)   Drives patient "out of" her "mind"      Medication List    TAKE these medications   acetaminophen 325 MG tablet Commonly known as:  TYLENOL Take 2 tablets (650 mg total) by mouth every 6 (six) hours as needed for pain.   alendronate 70 MG tablet Commonly known as:  FOSAMAX Take 1 tablet (70 mg total) by mouth every 7 (seven) days. Take with a full glass of water on an empty stomach.   apixaban 5 MG Tabs tablet Commonly known as:  ELIQUIS Take 1 tablet (5 mg total) by mouth 2 (two) times daily.   Calcium Carbonate-Vitamin D 600-400 MG-UNIT tablet Commonly known as:  CALCARB 600/D Take 1 tablet by mouth 3 (three) times daily with meals.   cholestyramine 4 g packet Commonly known as:  QUESTRAN MIX AND TAKE 1 PACKET BY MOUTH DAILY AS NEEDED What changed:  additional instructions   metoprolol tartrate 25 MG tablet Commonly known as:  LOPRESSOR Take 0.5 tablets (12.5 mg total) by mouth 2 (two) times daily.   multivitamin with minerals tablet Take 1 tablet by mouth daily.   nitroGLYCERIN 0.4 MG SL tablet Commonly known as:  NITROSTAT DISSOLVE 1 TABLET UNDER THE TONGUE EVERY 5 MINUTES AS NEEDED FOR CHEST PAIN   nystatin powder Commonly known as:  MYCOSTATIN/NYSTOP Apply topically 3 (three) times daily. Area of rash in abdominal skin fold.   simvastatin 10 MG tablet Commonly known as:  ZOCOR Take 1 tablet (10 mg total) by mouth daily.   traMADol 50 MG tablet Commonly known as:  ULTRAM Take 1 tablet (50 mg total) by mouth every  12 (twelve) hours as needed for moderate pain (if tylenol ineffective).            Durable Medical Equipment        Start     Ordered   11/20/16 1415  For home use only DME standard manual wheelchair with seat cushion  Once    Comments:  Patient suffers from pubic ramus fracture which impairs their ability to perform daily activities like bathing and toileting in the home.  A cane, crutch or walker will not resolve  issue with performing activities of daily living. A wheelchair will allow patient to safely perform daily activities. Patient can safely propel the wheelchair in the home or has a caregiver who can provide assistance.  Accessories: elevating leg rests (ELRs), wheel locks, extensions and anti-tippers.   11/20/16 1414      Disposition and follow-up:   Lisa Haynes [facility] in Stable condition.  At the hospital follow up visit please address:  1.  Please ensure patient has followed up with her PCP and orthopedics.  2.  Labs / imaging needed at time of follow-up: None  3.  Pending labs/ test needing follow-up: None  Follow-up Appointments: Follow-up Information    Sheral ApleyMurphy, Timothy D, MD Follow up in 2 week(s).   Specialty:  Orthopedic Surgery Contact information: [address] [facility]  [place] [address] [phone]        Eulah Pont, MD. Schedule an appointment as soon as possible for a visit.   Specialty:  Internal Medicine Why:  Follow-up in 1 week.  Contact information: [address] [place] [address] [phone]           Hospital Course by problem list: Active Problems:   Inferior pubic ramus fracture (HCC)   Pubic ramus fracture (HCC)   Pressure injury of skin   Right inferior pubic ramus fracture: Patient presented as a transfer from [facility] for an acute right inferior pubic ramus fracture after a mechanical fall at home. Orthopedics reccommended weight bearing as  tolerated and outpatient follow-up with Dr. Eulah Pont in 2 weeks. Her pain was well controlled with Tramadol as needed. Patient was evaluated by PT andOT. They recommdned a wheelchair and discharge to a skilled nusring facility. Patient agreed with the plan.  Atrial fibrillation: Stable. Continued on home metoprolol and Eliquis.   HLD: Continued on home simvastatin.   Pressure injury of skin and intertriginous rash: Patient was found to have an area of erythema (blanching, not pressure) in buttock area.  Moisture associated skin damage, specifically incontinence associated dermatitis.  Also noted to have intertriginous dermatitis in the subpannicular skin fold. Nystatin powder was used for the intertriginous rash. Wound care was involved in the patient's care.     Discharge Vitals:   BP (!) 155/59 (BP Location: Left Arm)   Pulse 70   Temp (!) 97.5 F (36.4 C) (Oral)   Resp 16   Wt 194 lb 3.2 oz (88.1 kg)   SpO2 96%   BMI 36.10 kg/m   Pertinent Labs, Studies, and Procedures:  CBC    Component Value Date/Time   WBC 9.3 11/19/2016 2150   RBC 4.31 11/19/2016 2150   HGB 12.7 11/19/2016 2150   HCT 40.1 11/19/2016 2150   PLT 160 11/19/2016 2150   MCV 93.0 11/19/2016 2150   MCH 29.5 11/19/2016 2150   MCHC 31.7 11/19/2016 2150   RDW 14.4 11/19/2016 2150   LYMPHSABS 3.1 11/19/2016 2150   MONOABS 0.8 11/19/2016 2150   EOSABS 0.2 11/19/2016 2150   BASOSABS 0.0 11/19/2016 2150   BMET    Component Value Date/Time   NA 141 11/19/2016 2150   K 3.8 11/19/2016 2150   CL 108 11/19/2016 2150   CO2 26 11/19/2016 2150   GLUCOSE 99 11/19/2016 2150   BUN 14 11/19/2016 2150   CREATININE 0.78 11/19/2016 2150   CREATININE 0.77 08/20/2014 1158   CALCIUM 9.2 11/19/2016 2150   GFRNONAA >60 11/19/2016 2150   GFRNONAA 75 08/20/2014 1158   GFRAA >60 11/19/2016 2150   GFRAA 87 08/20/2014 1158    Signed: John Giovanni, MD 11/21/2016, 11:34 AM   Pager: [phone]

## 2016-11-21 NOTE — Progress Notes (Signed)
CSW received a call back from [facility] at [facility] who is [facility], as requested by Toniann FailWendy at [facility].   CSW awaiting return call from HazenDebbie.  Dorothe PeaJonathan F. Yonas Bunda, Francesco SorLCSWA, LCAS, CSI Clinical Social Worker Ph: [phone]

## 2016-11-21 NOTE — Care Management Obs Status (Signed)
MEDICARE OBSERVATION STATUS NOTIFICATION   Patient Details  Name: Lucilla Edinatsy J Panella MRN: [id number] Date of Birth: [birth year]   Medicare Observation Status Notification Given:  Yes    CrutchfieldDerrill Memo, Jolon Degante M, RN 11/21/2016, 12:15 PM

## 2016-11-21 NOTE — Progress Notes (Signed)
[facility] has been called and RN is aware and aware hard copies of scripts are to be sent with facesheet and med necessity transport form.  Dorothe PeaJonathan F. Carman Auxier, Francesco SorLCSWA, LCAS, CSI Clinical Social Worker Ph: [phone]

## 2016-11-21 NOTE — Progress Notes (Addendum)
   Subjective: Patient was resting comfortably in her bed and denied having any pain. She had finished her breakfast. No other complaints. Discussed plan for SNF and patient agreed.   Objective:  Vital signs in last 24 hours: Vitals:   11/20/16 1422 11/20/16 2026 11/21/16 0445 11/21/16 0500  BP: (!) 143/58 (!) 146/66 (!) 155/59   Pulse: 66 (!) 52 (!) 58   Resp: 18 16 16    Temp: 97.6 F (36.4 C) 98.3 F (36.8 C) (!) 97.4 F (36.3 C) (!) 97.5 F (36.4 C)  TempSrc: Oral Oral Oral Oral  SpO2: 97% 96% 96%   Weight:       ROS limited due to patients mental status, see subjective.  Physical Exam  Constitutional: She appears well-developed and well-nourished. No distress.  HENT:  Head: Normocephalic and atraumatic.  Cardiovascular: Normal rate, regular rhythm and intact distal pulses.   Pulmonary/Chest: Effort normal and breath sounds normal. She has no wheezes.  Rales appreciated at the RLB Breathing comfortably on room air.   Abdominal: Soft. Bowel sounds are normal. She exhibits no distension. There is no tenderness.  Musculoskeletal: She exhibits no edema.  Able to move her feet and wiggle her toes.   Neurological: She is alert.  Skin: Skin is warm and dry. Rash: 5x5cm area of erythema and epidermal layer lesion consistent with topical fungal infection. She is not diaphoretic.   Assessment/Plan:  Active Problems:   Inferior pubic ramus fracture (HCC)   Pubic ramus fracture (HCC)   Pressure injury of skin  Pubic ramus fracture: Ortho recommending WBAT and outpatient f/u in 2 weeks with Dr. Eulah PontMurphy. Plan is for patient to go to SNF.  - Appreciate ortho recs  - Pain control with tramadol 50mg  q6 PRN, received a total of 2 doses yesterday  - Incentive spirometer   Pressure injury of skin: - Wound care on board, appreciate recs  - Nystatin powder for intertriginous rash   Atrial Fibrillation: -Will continue home dose metoprolol 12.5mg  BID and eliquis 5mg  daily for  anticoag  Dispo: Anticipated discharge in approximately 1-2 day(s). Pending SNF placement.   John Giovanniathore, Omari Mcmanaway, MD 11/21/2016, 9:12 AM

## 2016-11-21 NOTE — Care Management Note (Addendum)
Case Management Note  Patient Details  Name: Lisa Haynes MRN: [id number] Date of Birth: [birth year]  Subjective/Objective:  76 y.o. F admitted after a fall, with Pubic Rami fx. Spoke with pt who agrees with [facility] at discharge and agreeable to begin discussion with CSW.  CM made CSW aware probable SNF placement per OT recommendation.                   Action/Plan: CM will sign off for now but will be available should additional discharge needs arise or disposition change.    Expected Discharge Date:                  Expected Discharge Plan:  Skilled Nursing Facility  In-House Referral:  Clinical Social Work  Discharge planning Services  CM Consult  Post Acute Care Choice:    Choice offered to:  Patient  DME Arranged:    DME Agency:     HH Arranged:    HH Agency:     Status of Service:  In process, will continue to follow  If discussed at Long Length of Stay Meetings, dates discussed:    Additional Comments:  Yvone NeuCrutchfield, Lisa Timberman M, RN 11/21/2016, 9:21 AM

## 2016-11-21 NOTE — Clinical Social Work Placement (Signed)
   CLINICAL SOCIAL WORK PLACEMENT  NOTE  Date:  11/21/2016  Patient Details  Name: Lisa Haynes MRN: [id number] Date of Birth: May 22, 1940  Clinical Social Work is seeking post-discharge placement for this patient at the Skilled  Nursing Facility level of care (*CSW will initial, date and re-position this form in  chart as items are completed):      Patient/family provided with [facility] Clinical Social Work Department's list of facilities offering this level of care within the geographic area requested by the patient (or if unable, by the patient's family).  Yes   Patient/family informed of their freedom to choose among providers that offer the needed level of care, that participate in Medicare, Medicaid or managed care program needed by the patient, have an available bed and are willing to accept the patient.      Patient/family informed of [facility] ownership interest in [facility] and [facility], as well as of the fact that they are under no obligation to receive care at these facilities.  PASRR submitted to EDS on       PASRR number received on 11/21/16     Existing PASRR number confirmed on 11/21/16     FL2 transmitted to all facilities in geographic area requested by pt/family on       FL2 transmitted to all facilities within larger geographic area on 11/21/16     Patient informed that his/her managed care company has contracts with or will negotiate with certain facilities, including the following:   ([facility])     Yes   Patient/family informed of bed offers received.  Patient chooses bed at  [facility])     Physician recommends and patient chooses bed at      Patient to be transferred to  [facility]) on  .  Patient to be transferred to facility by  Sharin Mons([facility])     Patient family notified on 11/21/16 of transfer.  Name of family member notified:  Sheela Stacknnette Gray (Niece) and son Eddie     PHYSICIAN       Additional Comment:     _______________________________________________ Mercy RidingJonathan F Oracio Galen, LCSWA 11/21/2016, 2:10 PM

## 2016-11-21 NOTE — Progress Notes (Addendum)
CSW spoke to pr's niece Sheela Stacknnette Gray at ph: [phone] who will go to [facility] to sign the pt in.  CSW called Wellsite geologistMedical Director who offered a 5 LOG to verify, [facility], that Medical Director is ok'ing pt's admittance.  Dorothe PeaJonathan F. Talyn Eddie, Francesco SorLCSWA, LCAS, CSI Clinical Social Worker Ph: [phone]

## 2016-11-21 NOTE — Progress Notes (Signed)
CSW received a call from Toniann FailWendy Pt has been accepted by: [facility] Number for report is: [phone] Pt's unit/room/bed number will be: [id number] Accepting physician: SNF MD   Pt can arrive ASAP on 11/21/16 after 3pm  CSW will update RN and EDP.  Dorothe PeaJonathan F. Nao Linz, Francesco SorLCSWA, LCAS, CSI Clinical Social Worker Ph: [phone]

## 2016-11-21 NOTE — NC FL2 (Signed)
[place] MEDICAID FL2 LEVEL OF CARE SCREENING TOOL     IDENTIFICATION  Patient Name: Lisa Haynes Birthdate: 1940-09-08 Sex: female Admission Date (Current Location): 11/19/2016  [facility] and [place] Number:  Producer, television/film/video and Address:  [facility] [place], [address], [place], [place] [address]      Provider Number: [id number]  Attending Physician Name and Address:  Earl Lagos, MD  Relative Name and Phone Number:       Current Level of Care:   Recommended Level of Care: Skilled Nursing Facility Prior Approval Number:    Date Approved/Denied: 06/21/14 PASRR Number: [id number]  Discharge Plan: SNF    Current Diagnoses: Patient Active Problem List   Diagnosis Date Noted  . Pressure injury of skin 11/20/2016  . Inferior pubic ramus fracture (HCC) 11/19/2016  . Pubic ramus fracture (HCC) 11/19/2016  . Hallucinations 09/12/2016  . Unsteady gait 05/29/2016  . Atrial flutter (HCC) 11/20/2015  . 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 Paroxysmal atrial fibrillation (HCC) 09/25/2015  . Diverticulosis 06/20/2014  . Preventative health care 10/15/2010  . Hyperlipidemia 01/29/2009  . GERD 04/15/2008  . Depression with anxiety 09/01/2007  . Essential hypertension 07/13/2006  . Osteoporosis 07/13/2006    Orientation RESPIRATION BLADDER Height & Weight     Self  Normal Incontinent (Suction cannister) Weight: 194 lb 3.2 oz (88.1 kg) Height:     BEHAVIORAL SYMPTOMS/MOOD NEUROLOGICAL BOWEL NUTRITION STATUS      Continent Diet (Regular diet; Fluid consistency thin)  AMBULATORY STATUS COMMUNICATION OF NEEDS Skin   Extensive Assist Verbally Normal                       Personal Care Assistance Level of Assistance  Bathing, Dressing Bathing Assistance: Limited assistance   Dressing Assistance: Limited assistance     Functional Limitations Info             SPECIAL CARE FACTORS FREQUENCY  PT (By licensed PT), OT (By  licensed OT)     PT Frequency: 5 OT Frequency: 5            Contractures      Additional Factors Info  Code Status, Allergies Code Status Info: FULL Allergies Info: Morphine And Related           Current Medications (11/21/2016):  This is the current hospital active medication list Current Facility-Administered Medications  Medication Dose Route Frequency Provider Last Rate Last Dose  . acetaminophen (TYLENOL) tablet 1,000 mg  1,000 mg Oral Q6H PRN Valentino Nose, MD   1,000 mg at 11/20/16 2316  . apixaban (ELIQUIS) tablet 5 mg  5 mg Oral BID Valentino Nose, MD   5 mg at 11/21/16 0951  . calcium-vitamin D (OSCAL WITH D) 500-200 MG-UNIT per tablet 1 tablet  1 tablet Oral TID WC Valentino Nose, MD   1 tablet at 11/21/16 0951  . feeding supplement (ENSURE ENLIVE) (ENSURE ENLIVE) liquid 237 mL  237 mL Oral BID BM Earl Lagos, MD   237 mL at 11/21/16 0951  . metoprolol tartrate (LOPRESSOR) tablet 12.5 mg  12.5 mg Oral BID Valentino Nose, MD   12.5 mg at 11/21/16 0951  . multivitamin with minerals tablet 1 tablet  1 tablet Oral Daily Valentino Nose, MD   1 tablet at 11/21/16 0951  . nystatin (MYCOSTATIN/NYSTOP) topical powder   Topical TID John Giovanni, MD      . polyethylene glycol (MIRALAX / GLYCOLAX) packet 17 g  17  g Oral Daily PRN John Giovanniathore, Vasundhra, MD      . simvastatin (ZOCOR) tablet 10 mg  10 mg Oral Daily Valentino NoseBoswell, Nathan, MD   10 mg at 11/21/16 0951  . traMADol (ULTRAM) tablet 50 mg  50 mg Oral Q6H PRN Earl LagosNarendra, Nischal, MD   50 mg at 11/20/16 1439     Discharge Medications: Please see discharge summary for a list of discharge medications.  Relevant Imaging Results:  Relevant Lab Results:   Additional Information [id number]  Dorothe PeaJonathan F Iris Hairston, LCSWA

## 2016-11-21 NOTE — Progress Notes (Addendum)
CSW received a call from CM stating pt's discharge is possibly imminent.  CSW completed assessment with pt and left VM with son Harvie HeckRandy and niece Sheela Stacknnette Gray.   11:07 AM CSW received another call from CM stating pt is being D/C'd today (7/22).  CSW was informed pt is able to receive a 5-day LOG, per the medical director to the accepting SNF.  CSW completed assessment and is sending referrals out now.  11:15 AM CSW received a call from CM stating pt's discharge is now scheduled for today.  CSW left HIPPA-compliant VM with son Harvie HeckRandy and spoke with niece Sheela Stacknnette Gray stating D/C is today for their relative.  Niece stated [facility].  Dorothe PeaJonathan F. Maanya Hippert, Francesco SorLCSWA, LCAS, CSI Clinical Social Worker Ph: [phone]

## 2016-11-22 DIAGNOSIS — I4892 Unspecified atrial flutter: Secondary | ICD-10-CM | POA: Diagnosis not present

## 2016-11-22 DIAGNOSIS — I10 Essential (primary) hypertension: Secondary | ICD-10-CM | POA: Diagnosis not present

## 2016-11-22 DIAGNOSIS — W19XXXD Unspecified fall, subsequent encounter: Secondary | ICD-10-CM | POA: Diagnosis not present

## 2016-11-22 DIAGNOSIS — S32591D Other specified fracture of right pubis, subsequent encounter for fracture with routine healing: Secondary | ICD-10-CM | POA: Diagnosis not present

## 2016-11-24 DIAGNOSIS — I10 Essential (primary) hypertension: Secondary | ICD-10-CM | POA: Diagnosis not present

## 2016-11-24 DIAGNOSIS — I4891 Unspecified atrial fibrillation: Secondary | ICD-10-CM | POA: Diagnosis not present

## 2016-11-24 DIAGNOSIS — S32509A Unspecified fracture of unspecified pubis, initial encounter for closed fracture: Secondary | ICD-10-CM | POA: Diagnosis not present

## 2016-11-24 NOTE — Progress Notes (Addendum)
Late entry for missed G-codes for OT evaluation completed 11/21/16.    11/20/16 1600  OT G-codes **NOT FOR INPATIENT CLASS**  Functional Assessment Tool Used Clinical judgement  Functional Limitation Self care  Self Care Current Status (Z6109(G8987) CK  Self Care Goal Status (U0454(G8988) CI   Doristine Sectionharity A Matsuko Kretz, MS OTR/L  Pager: [phone]

## 2016-11-29 DIAGNOSIS — S32591D Other specified fracture of right pubis, subsequent encounter for fracture with routine healing: Secondary | ICD-10-CM | POA: Diagnosis not present

## 2016-11-29 DIAGNOSIS — I4891 Unspecified atrial fibrillation: Secondary | ICD-10-CM | POA: Diagnosis not present

## 2016-11-29 DIAGNOSIS — M810 Age-related osteoporosis without current pathological fracture: Secondary | ICD-10-CM | POA: Diagnosis not present

## 2016-11-29 DIAGNOSIS — I10 Essential (primary) hypertension: Secondary | ICD-10-CM | POA: Diagnosis not present

## 2016-12-07 ENCOUNTER — Other Ambulatory Visit: Payer: Self-pay | Admitting: *Deleted

## 2016-12-07 DIAGNOSIS — I482 Chronic atrial fibrillation, unspecified: Secondary | ICD-10-CM

## 2016-12-07 NOTE — Patient Outreach (Signed)
[facility] Care Management  12/07/2016  [name] Aug 09, 1940 [id number]      Met with patient, her aide, Lisa Haynes, her daughter-in-law and a granddaughter.  Patient reports she lives in an apartment, her son, Lisa Haynes lives with her but is more for oversight than assistance. Patient has Medicaid and is getting PCS, they will increase her hours. Patient has had 7 falls in the past 6 months, the most recent fall involved breaking her pelvis, the fall before she cut her head and had to have sutures.  Patient also has hx of A fib.  Patient reports she will need w/c and new shower bench. She was to get a w/c but it was not delivered before her fall. RNCM explained that she may have to have someone pick up the w/c as sometimes the DME companies do not deliver equipment.   RNCM reviewed [facility] program, patient agrees to services, in addition to home care. Patient signed consent, she has vision problems but was able to make her mark on consent.    Met with SW at facility. SW reports she has taken care of order for w/c for patient. Patient to have [facility] care. Discharge planned for 12/11/16  Plan to refer patient to [facility] for transition of care program. Discharge planned for 12/11/16. Contacts on consent: Lisa Haynes is niece who assists with bills and appts: [phone] Lisa Haynes is son who lives with patient: [phone]  Lisa Haynes. Laymond Purser, RN, BSN, [place] [phone]) Business Cell  [phone]) Toll Free Office

## 2016-12-07 NOTE — Patient Outreach (Signed)
Opened in error. Alben SpittleMary E. Albertha GheeNiemczura, RN, BSN, CCM  Post Acute Chartered loss adjusterCare Coordinator [facility] [phone]) Business Cell  [phone]) Toll Free Office

## 2016-12-08 DIAGNOSIS — E785 Hyperlipidemia, unspecified: Secondary | ICD-10-CM | POA: Diagnosis not present

## 2016-12-08 DIAGNOSIS — S32591D Other specified fracture of right pubis, subsequent encounter for fracture with routine healing: Secondary | ICD-10-CM | POA: Diagnosis not present

## 2016-12-08 DIAGNOSIS — I4891 Unspecified atrial fibrillation: Secondary | ICD-10-CM | POA: Diagnosis not present

## 2016-12-08 DIAGNOSIS — I10 Essential (primary) hypertension: Secondary | ICD-10-CM | POA: Diagnosis not present

## 2016-12-13 ENCOUNTER — Other Ambulatory Visit: Payer: Self-pay | Admitting: Internal Medicine

## 2016-12-13 DIAGNOSIS — H109 Unspecified conjunctivitis: Secondary | ICD-10-CM | POA: Diagnosis not present

## 2016-12-13 DIAGNOSIS — I4892 Unspecified atrial flutter: Secondary | ICD-10-CM

## 2016-12-13 DIAGNOSIS — I4891 Unspecified atrial fibrillation: Secondary | ICD-10-CM | POA: Diagnosis not present

## 2016-12-13 DIAGNOSIS — R32 Unspecified urinary incontinence: Secondary | ICD-10-CM | POA: Diagnosis not present

## 2016-12-13 DIAGNOSIS — M80851D Other osteoporosis with current pathological fracture, right femur, subsequent encounter for fracture with routine healing: Secondary | ICD-10-CM | POA: Diagnosis not present

## 2016-12-13 DIAGNOSIS — M818 Other osteoporosis without current pathological fracture: Secondary | ICD-10-CM | POA: Diagnosis not present

## 2016-12-13 DIAGNOSIS — I480 Paroxysmal atrial fibrillation: Secondary | ICD-10-CM | POA: Diagnosis not present

## 2016-12-13 DIAGNOSIS — R269 Unspecified abnormalities of gait and mobility: Secondary | ICD-10-CM | POA: Diagnosis not present

## 2016-12-13 DIAGNOSIS — W19XXXD Unspecified fall, subsequent encounter: Secondary | ICD-10-CM | POA: Diagnosis not present

## 2016-12-13 DIAGNOSIS — S32599D Other specified fracture of unspecified pubis, subsequent encounter for fracture with routine healing: Secondary | ICD-10-CM | POA: Diagnosis not present

## 2016-12-13 DIAGNOSIS — R260 Ataxic gait: Secondary | ICD-10-CM | POA: Diagnosis not present

## 2016-12-13 DIAGNOSIS — R2681 Unsteadiness on feet: Secondary | ICD-10-CM | POA: Diagnosis not present

## 2016-12-13 DIAGNOSIS — M6281 Muscle weakness (generalized): Secondary | ICD-10-CM | POA: Diagnosis not present

## 2016-12-13 DIAGNOSIS — B372 Candidiasis of skin and nail: Secondary | ICD-10-CM | POA: Diagnosis not present

## 2016-12-13 DIAGNOSIS — I10 Essential (primary) hypertension: Secondary | ICD-10-CM | POA: Diagnosis not present

## 2016-12-13 NOTE — Telephone Encounter (Signed)
HHN calls after her first visit today and states pt is confused, did not know who her doctor was and did not know a ph# neither did pt's caregivers who were not in  the home but were [facility] by the [facility].  HHN was given VO 1x week for 4 to 5 weeks for medication education and safety in the home, disease management she is also asking for PT and OT, VO given, do you agree?

## 2016-12-14 ENCOUNTER — Other Ambulatory Visit: Payer: Self-pay

## 2016-12-14 ENCOUNTER — Telehealth: Payer: Self-pay | Admitting: *Deleted

## 2016-12-14 DIAGNOSIS — I4891 Unspecified atrial fibrillation: Secondary | ICD-10-CM | POA: Diagnosis not present

## 2016-12-14 DIAGNOSIS — I480 Paroxysmal atrial fibrillation: Secondary | ICD-10-CM | POA: Diagnosis not present

## 2016-12-14 DIAGNOSIS — I10 Essential (primary) hypertension: Secondary | ICD-10-CM | POA: Diagnosis not present

## 2016-12-14 DIAGNOSIS — W19XXXD Unspecified fall, subsequent encounter: Secondary | ICD-10-CM | POA: Diagnosis not present

## 2016-12-14 DIAGNOSIS — M80851D Other osteoporosis with current pathological fracture, right femur, subsequent encounter for fracture with routine healing: Secondary | ICD-10-CM | POA: Diagnosis not present

## 2016-12-14 DIAGNOSIS — I4892 Unspecified atrial flutter: Secondary | ICD-10-CM | POA: Diagnosis not present

## 2016-12-14 DIAGNOSIS — B372 Candidiasis of skin and nail: Secondary | ICD-10-CM | POA: Diagnosis not present

## 2016-12-14 MED ORDER — NYSTATIN 100000 UNIT/GM EX POWD: Freq: Three times a day (TID) | CUTANEOUS | 0 refills | Status: DC

## 2016-12-14 MED ORDER — APIXABAN 5 MG PO TABS: 5.0000 mg | ORAL_TABLET | Freq: Two times a day (BID) | ORAL | 3 refills | Status: DC

## 2016-12-14 NOTE — Patient Outreach (Signed)
    Unsuccessful attempt made to contact patient for transition of care.  Plan: Make another attempt to contact patient in the next 21 days to complete transition of care.

## 2016-12-14 NOTE — Telephone Encounter (Signed)
Yes

## 2016-12-14 NOTE — Telephone Encounter (Signed)
Agree with VO.  I would like to not refill the tramadol at this time without repeat assessment, especially if she has increased confusion.

## 2016-12-14 NOTE — Telephone Encounter (Signed)
[facility], 1x week for 3 weeks. Do you agree?

## 2016-12-15 ENCOUNTER — Other Ambulatory Visit: Payer: Self-pay

## 2016-12-15 DIAGNOSIS — I480 Paroxysmal atrial fibrillation: Secondary | ICD-10-CM | POA: Diagnosis not present

## 2016-12-16 DIAGNOSIS — I480 Paroxysmal atrial fibrillation: Secondary | ICD-10-CM | POA: Diagnosis not present

## 2016-12-16 NOTE — Patient Outreach (Signed)
    Telephone contact with patient for transition of care coordination. Patient agreed to home visit within the next 28 days for the initial home visit.

## 2016-12-16 NOTE — Patient Outreach (Signed)
[facility]) Care Management  12/15/2016  Lisa Haynes 1940/09/14 [id number]   Telephone call to patient for transition of care contact. Patient agreed to home visit within the next 28 days

## 2016-12-17 ENCOUNTER — Ambulatory Visit: Payer: Medicare HMO

## 2016-12-17 DIAGNOSIS — I4892 Unspecified atrial flutter: Secondary | ICD-10-CM | POA: Diagnosis not present

## 2016-12-17 DIAGNOSIS — I10 Essential (primary) hypertension: Secondary | ICD-10-CM | POA: Diagnosis not present

## 2016-12-17 DIAGNOSIS — I480 Paroxysmal atrial fibrillation: Secondary | ICD-10-CM | POA: Diagnosis not present

## 2016-12-17 DIAGNOSIS — B372 Candidiasis of skin and nail: Secondary | ICD-10-CM | POA: Diagnosis not present

## 2016-12-17 DIAGNOSIS — I4891 Unspecified atrial fibrillation: Secondary | ICD-10-CM | POA: Diagnosis not present

## 2016-12-17 DIAGNOSIS — W19XXXD Unspecified fall, subsequent encounter: Secondary | ICD-10-CM | POA: Diagnosis not present

## 2016-12-17 DIAGNOSIS — M80851D Other osteoporosis with current pathological fracture, right femur, subsequent encounter for fracture with routine healing: Secondary | ICD-10-CM | POA: Diagnosis not present

## 2016-12-18 DIAGNOSIS — I480 Paroxysmal atrial fibrillation: Secondary | ICD-10-CM | POA: Diagnosis not present

## 2016-12-19 DIAGNOSIS — I480 Paroxysmal atrial fibrillation: Secondary | ICD-10-CM | POA: Diagnosis not present

## 2016-12-20 DIAGNOSIS — B372 Candidiasis of skin and nail: Secondary | ICD-10-CM | POA: Diagnosis not present

## 2016-12-20 DIAGNOSIS — I480 Paroxysmal atrial fibrillation: Secondary | ICD-10-CM | POA: Diagnosis not present

## 2016-12-20 DIAGNOSIS — I4892 Unspecified atrial flutter: Secondary | ICD-10-CM | POA: Diagnosis not present

## 2016-12-20 DIAGNOSIS — I10 Essential (primary) hypertension: Secondary | ICD-10-CM | POA: Diagnosis not present

## 2016-12-20 DIAGNOSIS — W19XXXD Unspecified fall, subsequent encounter: Secondary | ICD-10-CM | POA: Diagnosis not present

## 2016-12-20 DIAGNOSIS — M80851D Other osteoporosis with current pathological fracture, right femur, subsequent encounter for fracture with routine healing: Secondary | ICD-10-CM | POA: Diagnosis not present

## 2016-12-20 DIAGNOSIS — I4891 Unspecified atrial fibrillation: Secondary | ICD-10-CM | POA: Diagnosis not present

## 2016-12-21 ENCOUNTER — Other Ambulatory Visit: Payer: Self-pay

## 2016-12-21 ENCOUNTER — Encounter: Payer: Medicare HMO | Admitting: Internal Medicine

## 2016-12-21 DIAGNOSIS — I480 Paroxysmal atrial fibrillation: Secondary | ICD-10-CM | POA: Diagnosis not present

## 2016-12-21 DIAGNOSIS — B372 Candidiasis of skin and nail: Secondary | ICD-10-CM | POA: Diagnosis not present

## 2016-12-21 DIAGNOSIS — I10 Essential (primary) hypertension: Secondary | ICD-10-CM | POA: Diagnosis not present

## 2016-12-21 DIAGNOSIS — I4891 Unspecified atrial fibrillation: Secondary | ICD-10-CM | POA: Diagnosis not present

## 2016-12-21 DIAGNOSIS — I4892 Unspecified atrial flutter: Secondary | ICD-10-CM | POA: Diagnosis not present

## 2016-12-21 DIAGNOSIS — W19XXXD Unspecified fall, subsequent encounter: Secondary | ICD-10-CM | POA: Diagnosis not present

## 2016-12-21 DIAGNOSIS — M80851D Other osteoporosis with current pathological fracture, right femur, subsequent encounter for fracture with routine healing: Secondary | ICD-10-CM | POA: Diagnosis not present

## 2016-12-21 NOTE — Patient Outreach (Signed)
[facility]) Care Management   12/21/2016  Lisa Haynes 1940/11/13 [id number]  Lisa Haynes is an 76 y.o. female who recently discharged from SNF after an acute care stay after falling at home.   Subjective:  I am feeling a lot better. I am getting around a lot better also  Objective:   ROS  Elderly, petite in height, well groomed lady very pleasant. Patient lives in senior one level apartment with 24 hour caregivers.   Physical Exam  ROS  Encounter Medications:   Outpatient Encounter Prescriptions as of 12/21/2016  Medication Sig  . acetaminophen (TYLENOL) 325 MG tablet Take 2 tablets (650 mg total) by mouth every 6 (six) hours as needed for pain.  Marland Kitchen alendronate (FOSAMAX) 70 MG tablet Take 1 tablet (70 mg total) by mouth every 7 (seven) days. Take with a full glass of water on an empty stomach.  Marland Kitchen apixaban (ELIQUIS) 5 MG TABS tablet Take 1 tablet (5 mg total) by mouth 2 (two) times daily.  . Calcium Carbonate-Vitamin D (CALCARB 600/D) 600-400 MG-UNIT tablet Take 1 tablet by mouth 3 (three) times daily with meals.  . cholestyramine (QUESTRAN) 4 g packet MIX AND TAKE 1 PACKET BY MOUTH DAILY AS NEEDED (Patient taking differently: MIX AND TAKE 1 PACKET BY MOUTH DAILY AS NEEDED FOR DIARRHEA)  . metoprolol tartrate (LOPRESSOR) 25 MG tablet Take 0.5 tablets (12.5 mg total) by mouth 2 (two) times daily.  . Multiple Vitamins-Minerals (MULTIVITAMIN WITH MINERALS) tablet Take 1 tablet by mouth daily.  . nitroGLYCERIN (NITROSTAT) 0.4 MG SL tablet DISSOLVE 1 TABLET UNDER THE TONGUE EVERY 5 MINUTES AS NEEDED FOR CHEST PAIN  . nystatin (MYCOSTATIN/NYSTOP) powder Apply topically 3 (three) times daily. Area of rash in abdominal skin fold.  . simvastatin (ZOCOR) 10 MG tablet Take 1 tablet (10 mg total) by mouth daily.  . traMADol (ULTRAM) 50 MG tablet Take 1 tablet (50 mg total) by mouth every 12 (twelve) hours as needed for moderate pain (if tylenol ineffective).   No  facility-administered encounter medications on file as of 12/21/2016.     Functional Status:   In your present state of health, do you have any difficulty performing the following activities: 12/21/2016 11/21/2016  Hearing? Y -  Comment - -  Vision? Y -  Comment - -  Difficulty concentrating or making decisions? Y -  Walking or climbing stairs? Y -  Comment - -  Dressing or bathing? Y -  Comment - -  Doing errands, shopping? Y Y  Comment - -  Preparing Food and eating ? Y -  Using the Toilet? N -  In the past six months, have you accidently leaked urine? Y -  Do you have problems with loss of bowel control? Y -  Managing your Medications? Y -  Managing your Finances? Y -  Housekeeping or managing your Housekeeping? Y -  Some recent data might be hidden    Fall/Depression Screening:    Fall Risk  12/21/2016 09/24/2016 09/10/2016  Falls in the past year? Yes Yes Yes  Number falls in past yr: 2 or more 2 or more 2 or more  Comment - - -  Injury with Fall? Yes Yes Yes  Comment - - -  Risk Factor Category  - High Fall Risk High Fall Risk  Risk for fall due to : History of fall(s);Impaired balance/gait;Impaired mobility;Impaired vision;Medication side effect History of fall(s) History of fall(s);Impaired balance/gait  Risk for fall due to: Comment - - -  Follow up Falls prevention discussed - Falls prevention discussed   PHQ 2/9 Scores 12/21/2016 09/24/2016 09/10/2016 05/25/2016 03/10/2016 12/31/2015 12/30/2015  PHQ - 2 Score 0 0 0 0 0 2 3  PHQ- 9 Score - - - - - 6 7   Depression screen [facility] 2/9 12/21/2016 09/24/2016 09/10/2016 05/25/2016 03/10/2016  Decreased Interest 0 0 0 0 0  Down, Depressed, Hopeless 0 0 0 0 0  PHQ - 2 Score 0 0 0 0 0  Altered sleeping - - - - -  Tired, decreased energy - - - - -  Change in appetite - - - - -  Feeling bad or failure about yourself  - - - - -  Trouble concentrating - - - - -  Moving slowly or fidgety/restless - - - - -  Suicidal thoughts - - - - -   PHQ-9 Score - - - - -  Difficult doing work/chores - - - - -  Some recent data might be hidden   Assessment:   [name] is a 76 year old with a history of falls, Heart Failure. [name] has full Medicaid, and In [employer] as a result. Patient's primary caregiver is her son, with others filling in to provide 24 hour care. Patient is alert and oriented, able to ambulate with rolling walker to support gait. [name] lives in senior housing (a single level, one bedroom apartment which is part of the [employer]).  Patient reports not having a scale for daily weights, order for scales placed.  Patient advised his expect the scales to be delivered via [employer], [employer] in the next 7-10 business days.  Plan:  Telephone contact with patient in the next 28 days to assess patient's progress in meeting her case management goals.

## 2016-12-22 DIAGNOSIS — I480 Paroxysmal atrial fibrillation: Secondary | ICD-10-CM | POA: Diagnosis not present

## 2016-12-23 DIAGNOSIS — I4892 Unspecified atrial flutter: Secondary | ICD-10-CM | POA: Diagnosis not present

## 2016-12-23 DIAGNOSIS — I480 Paroxysmal atrial fibrillation: Secondary | ICD-10-CM | POA: Diagnosis not present

## 2016-12-23 DIAGNOSIS — I10 Essential (primary) hypertension: Secondary | ICD-10-CM | POA: Diagnosis not present

## 2016-12-23 DIAGNOSIS — B372 Candidiasis of skin and nail: Secondary | ICD-10-CM | POA: Diagnosis not present

## 2016-12-23 DIAGNOSIS — M80851D Other osteoporosis with current pathological fracture, right femur, subsequent encounter for fracture with routine healing: Secondary | ICD-10-CM | POA: Diagnosis not present

## 2016-12-23 DIAGNOSIS — W19XXXD Unspecified fall, subsequent encounter: Secondary | ICD-10-CM | POA: Diagnosis not present

## 2016-12-23 DIAGNOSIS — I4891 Unspecified atrial fibrillation: Secondary | ICD-10-CM | POA: Diagnosis not present

## 2016-12-24 DIAGNOSIS — M80851D Other osteoporosis with current pathological fracture, right femur, subsequent encounter for fracture with routine healing: Secondary | ICD-10-CM | POA: Diagnosis not present

## 2016-12-24 DIAGNOSIS — I480 Paroxysmal atrial fibrillation: Secondary | ICD-10-CM | POA: Diagnosis not present

## 2016-12-24 DIAGNOSIS — B372 Candidiasis of skin and nail: Secondary | ICD-10-CM | POA: Diagnosis not present

## 2016-12-24 DIAGNOSIS — W19XXXD Unspecified fall, subsequent encounter: Secondary | ICD-10-CM | POA: Diagnosis not present

## 2016-12-24 DIAGNOSIS — I10 Essential (primary) hypertension: Secondary | ICD-10-CM | POA: Diagnosis not present

## 2016-12-24 DIAGNOSIS — I4891 Unspecified atrial fibrillation: Secondary | ICD-10-CM | POA: Diagnosis not present

## 2016-12-24 DIAGNOSIS — I4892 Unspecified atrial flutter: Secondary | ICD-10-CM | POA: Diagnosis not present

## 2016-12-25 DIAGNOSIS — I480 Paroxysmal atrial fibrillation: Secondary | ICD-10-CM | POA: Diagnosis not present

## 2016-12-26 DIAGNOSIS — I480 Paroxysmal atrial fibrillation: Secondary | ICD-10-CM | POA: Diagnosis not present

## 2016-12-27 ENCOUNTER — Other Ambulatory Visit: Payer: Self-pay

## 2016-12-27 DIAGNOSIS — I4891 Unspecified atrial fibrillation: Secondary | ICD-10-CM | POA: Diagnosis not present

## 2016-12-27 DIAGNOSIS — W19XXXD Unspecified fall, subsequent encounter: Secondary | ICD-10-CM | POA: Diagnosis not present

## 2016-12-27 DIAGNOSIS — M80851D Other osteoporosis with current pathological fracture, right femur, subsequent encounter for fracture with routine healing: Secondary | ICD-10-CM | POA: Diagnosis not present

## 2016-12-27 DIAGNOSIS — I4892 Unspecified atrial flutter: Secondary | ICD-10-CM | POA: Diagnosis not present

## 2016-12-27 DIAGNOSIS — B372 Candidiasis of skin and nail: Secondary | ICD-10-CM | POA: Diagnosis not present

## 2016-12-27 DIAGNOSIS — I10 Essential (primary) hypertension: Secondary | ICD-10-CM | POA: Diagnosis not present

## 2016-12-27 DIAGNOSIS — I480 Paroxysmal atrial fibrillation: Secondary | ICD-10-CM | POA: Diagnosis not present

## 2016-12-27 NOTE — Progress Notes (Signed)
   CC: black stools, non productive cough   HPI:  [name] is a 76 y.o. with PMH as listed below who presents for acute concern of black stools and non productive cough and follow up of diverticulosis, osteoporosis, hypertension, hallucinations, and hyperlipidemia. Please see the assessment and plans for the status of the patient chronic medical problems.   Past Medical History:  Diagnosis Date  . Depression   . GERD (gastroesophageal reflux disease)   . High cholesterol   . Hypertension   . Osteoarthritis    "legs" (11/11/2015)  . Osteoporosis   . Paroxysmal atrial fibrillation (HCC) 08/20/2014  . Urinary incontinence    Review of Systems:  Refer to history of present illness and assessment and plans for pertinent review of systems, all others reviewed and negative  Physical Exam:  Vitals:   12/28/16 1346  BP: 136/67  Pulse: 71  Temp: 98.1 F (36.7 C)  TempSrc: Oral  SpO2: 96%  Weight: 191 lb 8 oz (86.9 kg)  Height: 5' 1.5" (1.562 m)   Physical Exam  Constitutional: She is well-developed, well-nourished, and in no distress. No distress.  Cardiovascular: Normal rate and regular rhythm.   No murmur heard. Trace peripheral edema   Pulmonary/Chest: Effort normal and breath sounds normal. No respiratory distress. She has no wheezes. She has no rales.  Abdominal: Soft. Bowel sounds are normal. She exhibits no distension. There is no tenderness.  Skin: She is not diaphoretic.    Assessment & Plan:   Black stools  Pt describes intermittent dark stools. Denies BRBPR, unexplained weight loss, changes in bowel habits ( constipation or diarrhea). She is on eliquis for afib and has no other signs of bleeding today. Denies lightheadedness, shortness of breath, or fatigue.  - CBC today  - FOBT today ( this was sent home with the patient from the lab)  - referral for diagnostic colonoscopy if CBC or FOBT suggest blood loss  Non productive cough  Pt is having a new nagging  non productive cough which began one week ago after she had URI symptoms. The cough is worse at night. Cough is not associated with shortness of breath, chest pain, rhinorrhea,or chills.  - Trial of coricidin, tessalon, and saline nasal spray   Diverticulosis  Denies constipation or BRBPR.  - continue to monitor   Osteoporosis  Fosamax 70 mg qweek started /2017. She had a right inferior pubic ramus fracture 11/19/2016 related to frequent falls, now [facility] is on board and she is having home PT.  - continue fosamax 70 mg daily   Hyperlipidemia  No reports of muscle pain.  - continue Simvastatin 10 mg daily   Hypertension  BP Readings from Last 3 Encounters:  12/28/16 136/67  11/21/16 (!) 172/52  10/19/16 (!) 153/61  BP below goal <140/90, hx of orthostatic hypotension, not on antihypertensives.  - counseled on diet and weight loss   Hallucinations  At last visit she was describing visual hallucinations which were disturbing to her and her family expressed concern for the worsening of her dementia. At that time her clock face and 3 word recall test revealed drastic decline . She was referred to geripsych but this referral was cancelled when her pelvis was broken.  - Referral to Geripsych  Patient discussed with Dr. Criselda Peaches

## 2016-12-28 ENCOUNTER — Ambulatory Visit (INDEPENDENT_AMBULATORY_CARE_PROVIDER_SITE_OTHER): Payer: Medicare HMO | Admitting: Internal Medicine

## 2016-12-28 ENCOUNTER — Encounter: Payer: Self-pay | Admitting: Internal Medicine

## 2016-12-28 VITALS — BP 136/67 | HR 71 | Temp 98.1°F | Ht 61.5 in | Wt 191.5 lb

## 2016-12-28 DIAGNOSIS — R195 Other fecal abnormalities: Secondary | ICD-10-CM | POA: Diagnosis not present

## 2016-12-28 DIAGNOSIS — R059 Cough, unspecified: Secondary | ICD-10-CM

## 2016-12-28 DIAGNOSIS — Z9181 History of falling: Secondary | ICD-10-CM | POA: Diagnosis not present

## 2016-12-28 DIAGNOSIS — F0390 Unspecified dementia without behavioral disturbance: Secondary | ICD-10-CM

## 2016-12-28 DIAGNOSIS — I4891 Unspecified atrial fibrillation: Secondary | ICD-10-CM | POA: Diagnosis not present

## 2016-12-28 DIAGNOSIS — I4892 Unspecified atrial flutter: Secondary | ICD-10-CM | POA: Diagnosis not present

## 2016-12-28 DIAGNOSIS — E785 Hyperlipidemia, unspecified: Secondary | ICD-10-CM | POA: Diagnosis not present

## 2016-12-28 DIAGNOSIS — Z7901 Long term (current) use of anticoagulants: Secondary | ICD-10-CM

## 2016-12-28 DIAGNOSIS — I480 Paroxysmal atrial fibrillation: Secondary | ICD-10-CM | POA: Diagnosis not present

## 2016-12-28 DIAGNOSIS — M80851D Other osteoporosis with current pathological fracture, right femur, subsequent encounter for fracture with routine healing: Secondary | ICD-10-CM

## 2016-12-28 DIAGNOSIS — W19XXXD Unspecified fall, subsequent encounter: Secondary | ICD-10-CM | POA: Diagnosis not present

## 2016-12-28 DIAGNOSIS — M8000XD Age-related osteoporosis with current pathological fracture, unspecified site, subsequent encounter for fracture with routine healing: Secondary | ICD-10-CM

## 2016-12-28 DIAGNOSIS — E7800 Pure hypercholesterolemia, unspecified: Secondary | ICD-10-CM

## 2016-12-28 DIAGNOSIS — R443 Hallucinations, unspecified: Secondary | ICD-10-CM

## 2016-12-28 DIAGNOSIS — K921 Melena: Secondary | ICD-10-CM

## 2016-12-28 DIAGNOSIS — K5730 Diverticulosis of large intestine without perforation or abscess without bleeding: Secondary | ICD-10-CM

## 2016-12-28 DIAGNOSIS — K5790 Diverticulosis of intestine, part unspecified, without perforation or abscess without bleeding: Secondary | ICD-10-CM

## 2016-12-28 DIAGNOSIS — R05 Cough: Secondary | ICD-10-CM | POA: Diagnosis not present

## 2016-12-28 DIAGNOSIS — R441 Visual hallucinations: Secondary | ICD-10-CM | POA: Diagnosis not present

## 2016-12-28 DIAGNOSIS — Z79899 Other long term (current) drug therapy: Secondary | ICD-10-CM

## 2016-12-28 DIAGNOSIS — I10 Essential (primary) hypertension: Secondary | ICD-10-CM

## 2016-12-28 DIAGNOSIS — F17220 Nicotine dependence, chewing tobacco, uncomplicated: Secondary | ICD-10-CM

## 2016-12-28 DIAGNOSIS — B372 Candidiasis of skin and nail: Secondary | ICD-10-CM | POA: Diagnosis not present

## 2016-12-28 MED ORDER — BENZONATATE 100 MG PO CAPS: 100.0000 mg | ORAL_CAPSULE | Freq: Four times a day (QID) | ORAL | 1 refills | Status: DC | PRN

## 2016-12-28 NOTE — Patient Outreach (Signed)
[facility]) Care Management  12/28/2016   Lisa Haynes Oct 05, 1940 [id number]  Subjective:  I received the scales, I have started weighing.  Objective:  Patient has scales for daily weights.  Patient states she has started to weigh daily and record.  Current Medications:  Current Outpatient Prescriptions  Medication Sig Dispense Refill  . acetaminophen (TYLENOL) 325 MG tablet Take 2 tablets (650 mg total) by mouth every 6 (six) hours as needed for pain. 60 tablet 3  . alendronate (FOSAMAX) 70 MG tablet Take 1 tablet (70 mg total) by mouth every 7 (seven) days. Take with a full glass of water on an empty stomach. 4 tablet 11  . apixaban (ELIQUIS) 5 MG TABS tablet Take 1 tablet (5 mg total) by mouth 2 (two) times daily. 60 tablet 3  . benzonatate (TESSALON PERLES) 100 MG capsule Take 1 capsule (100 mg total) by mouth every 6 (six) hours as needed for cough. 30 capsule 1  . Calcium Carbonate-Vitamin D (CALCARB 600/D) 600-400 MG-UNIT tablet Take 1 tablet by mouth 3 (three) times daily with meals. 30 tablet 3  . cholestyramine (QUESTRAN) 4 g packet MIX AND TAKE 1 PACKET BY MOUTH DAILY AS NEEDED (Patient taking differently: MIX AND TAKE 1 PACKET BY MOUTH DAILY AS NEEDED FOR DIARRHEA) 60 packet 6  . metoprolol tartrate (LOPRESSOR) 25 MG tablet Take 0.5 tablets (12.5 mg total) by mouth 2 (two) times daily. 90 tablet 2  . Multiple Vitamins-Minerals (MULTIVITAMIN WITH MINERALS) tablet Take 1 tablet by mouth daily.    . nitroGLYCERIN (NITROSTAT) 0.4 MG SL tablet DISSOLVE 1 TABLET UNDER THE TONGUE EVERY 5 MINUTES AS NEEDED FOR CHEST PAIN 30 tablet 0  . nystatin (MYCOSTATIN/NYSTOP) powder Apply topically 3 (three) times daily. Area of rash in abdominal skin fold. 30 g 0  . simvastatin (ZOCOR) 10 MG tablet Take 1 tablet (10 mg total) by mouth daily. 90 tablet 4  . traMADol (ULTRAM) 50 MG tablet Take 1 tablet (50 mg total) by mouth every 12 (twelve) hours as needed for moderate pain  (if tylenol ineffective). 14 tablet 0   No current facility-administered medications for this visit.     Functional Status:  In your present state of health, do you have any difficulty performing the following activities: 12/28/2016 12/21/2016  Hearing? Y Y  Comment - -  Vision? Y Y  Comment - -  Difficulty concentrating or making decisions? Lisa Haynes  Walking or climbing stairs? Y Y  Comment - -  Dressing or bathing? Y Y  Comment - -  Doing errands, shopping? Y Y  Comment - -  Preparing Food and eating ? - Y  Using the Toilet? - N  In the past six months, have you accidently leaked urine? - Y  Do you have problems with loss of bowel control? - Y  Managing your Medications? - Y  Managing your Finances? - Y  Housekeeping or managing your Housekeeping? - Y  Some recent data might be hidden    Fall/Depression Screening: Fall Risk  12/28/2016 12/21/2016 09/24/2016  Falls in the past year? Yes Yes Yes  Number falls in past yr: 2 or more 2 or more 2 or more  Comment - - -  Injury with Fall? Yes Yes Yes  Comment - - -  Risk Factor Category  High Fall Risk - High Fall Risk  Risk for fall due to : History of fall(s);Impaired balance/gait;Impaired mobility History of fall(s);Impaired balance/gait;Impaired mobility;Impaired vision;Medication side effect History  of fall(s)  Risk for fall due to: Comment - - -  Follow up Falls prevention discussed;Education provided Falls prevention discussed -   PHQ 2/9 Scores 12/28/2016 12/21/2016 09/24/2016 09/10/2016 05/25/2016 03/10/2016 12/31/2015  PHQ - 2 Score 0 0 0 0 0 0 2  PHQ- 9 Score - - - - - - 6   [facility] CM Care Plan Problem One     Most Recent Value  Care Plan Problem One  patient had recent hospitalization due to a fall with injury  Role Documenting the Problem One  Care Management Coordinator  Care Plan for Problem One  Active  [facility] Long Term Goal   In the next 31 days, patient will have no acute admissions  [facility] Long Term Goal Start Date  12/15/16   Interventions for Problem One Long Term Goal  8/27 patient reports having no acute care admissions since our last encounter.    [facility] CM Short Term Goal #1   Patient will meet with RNCM for fall prevention education  [facility] CM Short Term Goal #1 Start Date  12/15/16  Interventions for Short Term Goal #1  8/27 patient reports falls since last encounter     Assessment:  Patient continues to progress towards meeting her case management goals. Patient reports feeling good, has had no falls.  Plan:  Contact with patient to assess her progress towards meeting her case management goals.

## 2016-12-28 NOTE — Assessment & Plan Note (Signed)
BP Readings from Last 3 Encounters:  12/28/16 136/67  11/21/16 (!) 172/52  10/19/16 (!) 153/61  BP below goal <140/90, hx of orthostatic hypotension, not on antihypertensives.  - counseled on diet and weight loss

## 2016-12-28 NOTE — Patient Instructions (Addendum)
Ms. Andrea,   It was a pleasure to see you today   Please complete these stool cards today  For your cough- try using an over the counter saline spray for your nose and coricidin cough suppressant   Someone should call to schedule an appointment with a psychiatrist with you in the next few weeks, call the clinic if you dont hear from someoen about this   Schedule a follow up appointment in 3 months

## 2016-12-28 NOTE — Assessment & Plan Note (Addendum)
Pt is having a new nagging non productive cough which began one week ago after she had URI symptoms. The cough is worse at night. Cough is not associated with shortness of breath, chest pain, rhinorrhea,or chills.  - Trial of coricidin, tessalon, and saline nasal spray

## 2016-12-28 NOTE — Assessment & Plan Note (Signed)
Denies constipation or BRBPR.  - continue to monitor

## 2016-12-28 NOTE — Assessment & Plan Note (Signed)
No reports of muscle pain.  - continue Simvastatin 10 mg daily

## 2016-12-28 NOTE — Telephone Encounter (Signed)
This encounter was created in error - please disregard.

## 2016-12-28 NOTE — Assessment & Plan Note (Signed)
Pt describes intermittent dark stools. Denies BRBPR, unexplained weight loss, changes in bowel habits ( constipation or diarrhea). She is on eliquis for afib and has no other signs of bleeding today. Denies lightheadedness, shortness of breath, or fatigue.  - CBC today  - FOBT today ( this was sent home with the patient from the lab)  - referral for diagnostic colonoscopy if CBC or FOBT suggest blood loss

## 2016-12-28 NOTE — Assessment & Plan Note (Signed)
At last visit she was describing visual hallucinations which were disturbing to her and her family expressed concern for the worsening of her dementia. At that time her clock face and 3 word recall test revealed drastic decline . She was referred to geripsych but this referral was cancelled when her pelvis was broken.  - Referral to [facility]

## 2016-12-28 NOTE — Assessment & Plan Note (Signed)
Fosamax 70 mg qweek started /2017. She had a right inferior pubic ramus fracture 11/19/2016 related to frequent falls, now [facility] is on board and she is having home PT.  - continue fosamax 70 mg daily

## 2016-12-29 DIAGNOSIS — I480 Paroxysmal atrial fibrillation: Secondary | ICD-10-CM | POA: Diagnosis not present

## 2016-12-29 LAB — CBC
HEMOGLOBIN: 13.5 g/dL (ref 11.1–15.9)
Hematocrit: 41.4 % (ref 34.0–46.6)
MCH: 28.7 pg (ref 26.6–33.0)
MCHC: 32.6 g/dL (ref 31.5–35.7)
MCV: 88 fL (ref 79–97)
Platelets: 223 10*3/uL (ref 150–379)
RBC: 4.7 x10E6/uL (ref 3.77–5.28)
RDW: 14.2 % (ref 12.3–15.4)
WBC: 9.5 10*3/uL (ref 3.4–10.8)

## 2016-12-30 DIAGNOSIS — W19XXXD Unspecified fall, subsequent encounter: Secondary | ICD-10-CM | POA: Diagnosis not present

## 2016-12-30 DIAGNOSIS — I4892 Unspecified atrial flutter: Secondary | ICD-10-CM | POA: Diagnosis not present

## 2016-12-30 DIAGNOSIS — B372 Candidiasis of skin and nail: Secondary | ICD-10-CM | POA: Diagnosis not present

## 2016-12-30 DIAGNOSIS — M80851D Other osteoporosis with current pathological fracture, right femur, subsequent encounter for fracture with routine healing: Secondary | ICD-10-CM | POA: Diagnosis not present

## 2016-12-30 DIAGNOSIS — I10 Essential (primary) hypertension: Secondary | ICD-10-CM | POA: Diagnosis not present

## 2016-12-30 DIAGNOSIS — I4891 Unspecified atrial fibrillation: Secondary | ICD-10-CM | POA: Diagnosis not present

## 2016-12-31 DIAGNOSIS — I4892 Unspecified atrial flutter: Secondary | ICD-10-CM | POA: Diagnosis not present

## 2016-12-31 DIAGNOSIS — W19XXXD Unspecified fall, subsequent encounter: Secondary | ICD-10-CM | POA: Diagnosis not present

## 2016-12-31 DIAGNOSIS — I4891 Unspecified atrial fibrillation: Secondary | ICD-10-CM | POA: Diagnosis not present

## 2016-12-31 DIAGNOSIS — I10 Essential (primary) hypertension: Secondary | ICD-10-CM | POA: Diagnosis not present

## 2016-12-31 DIAGNOSIS — M80851D Other osteoporosis with current pathological fracture, right femur, subsequent encounter for fracture with routine healing: Secondary | ICD-10-CM | POA: Diagnosis not present

## 2016-12-31 DIAGNOSIS — B372 Candidiasis of skin and nail: Secondary | ICD-10-CM | POA: Diagnosis not present

## 2017-01-01 DIAGNOSIS — I480 Paroxysmal atrial fibrillation: Secondary | ICD-10-CM | POA: Diagnosis not present

## 2017-01-02 DIAGNOSIS — I480 Paroxysmal atrial fibrillation: Secondary | ICD-10-CM | POA: Diagnosis not present

## 2017-01-03 DIAGNOSIS — I480 Paroxysmal atrial fibrillation: Secondary | ICD-10-CM | POA: Diagnosis not present

## 2017-01-04 ENCOUNTER — Ambulatory Visit: Payer: Self-pay

## 2017-01-04 DIAGNOSIS — W19XXXD Unspecified fall, subsequent encounter: Secondary | ICD-10-CM | POA: Diagnosis not present

## 2017-01-04 DIAGNOSIS — I4892 Unspecified atrial flutter: Secondary | ICD-10-CM | POA: Diagnosis not present

## 2017-01-04 DIAGNOSIS — M80851D Other osteoporosis with current pathological fracture, right femur, subsequent encounter for fracture with routine healing: Secondary | ICD-10-CM | POA: Diagnosis not present

## 2017-01-04 DIAGNOSIS — I480 Paroxysmal atrial fibrillation: Secondary | ICD-10-CM | POA: Diagnosis not present

## 2017-01-04 DIAGNOSIS — B372 Candidiasis of skin and nail: Secondary | ICD-10-CM | POA: Diagnosis not present

## 2017-01-04 DIAGNOSIS — I10 Essential (primary) hypertension: Secondary | ICD-10-CM | POA: Diagnosis not present

## 2017-01-04 DIAGNOSIS — I4891 Unspecified atrial fibrillation: Secondary | ICD-10-CM | POA: Diagnosis not present

## 2017-01-05 DIAGNOSIS — I480 Paroxysmal atrial fibrillation: Secondary | ICD-10-CM | POA: Diagnosis not present

## 2017-01-05 NOTE — Progress Notes (Signed)
Internal Medicine Clinic Attending  Case discussed with Dr. Blum at the time of the visit.  We reviewed the resident's history and exam and pertinent patient test results.  I agree with the assessment, diagnosis, and plan of care documented in the resident's note. 

## 2017-01-06 DIAGNOSIS — B372 Candidiasis of skin and nail: Secondary | ICD-10-CM | POA: Diagnosis not present

## 2017-01-06 DIAGNOSIS — I480 Paroxysmal atrial fibrillation: Secondary | ICD-10-CM | POA: Diagnosis not present

## 2017-01-06 DIAGNOSIS — W19XXXD Unspecified fall, subsequent encounter: Secondary | ICD-10-CM | POA: Diagnosis not present

## 2017-01-06 DIAGNOSIS — M80851D Other osteoporosis with current pathological fracture, right femur, subsequent encounter for fracture with routine healing: Secondary | ICD-10-CM | POA: Diagnosis not present

## 2017-01-06 DIAGNOSIS — I4891 Unspecified atrial fibrillation: Secondary | ICD-10-CM | POA: Diagnosis not present

## 2017-01-06 DIAGNOSIS — I10 Essential (primary) hypertension: Secondary | ICD-10-CM | POA: Diagnosis not present

## 2017-01-06 DIAGNOSIS — I4892 Unspecified atrial flutter: Secondary | ICD-10-CM | POA: Diagnosis not present

## 2017-01-07 DIAGNOSIS — I4891 Unspecified atrial fibrillation: Secondary | ICD-10-CM | POA: Diagnosis not present

## 2017-01-07 DIAGNOSIS — W19XXXD Unspecified fall, subsequent encounter: Secondary | ICD-10-CM | POA: Diagnosis not present

## 2017-01-07 DIAGNOSIS — I10 Essential (primary) hypertension: Secondary | ICD-10-CM | POA: Diagnosis not present

## 2017-01-07 DIAGNOSIS — I480 Paroxysmal atrial fibrillation: Secondary | ICD-10-CM | POA: Diagnosis not present

## 2017-01-07 DIAGNOSIS — M80851D Other osteoporosis with current pathological fracture, right femur, subsequent encounter for fracture with routine healing: Secondary | ICD-10-CM | POA: Diagnosis not present

## 2017-01-07 DIAGNOSIS — B372 Candidiasis of skin and nail: Secondary | ICD-10-CM | POA: Diagnosis not present

## 2017-01-07 DIAGNOSIS — I4892 Unspecified atrial flutter: Secondary | ICD-10-CM | POA: Diagnosis not present

## 2017-01-08 DIAGNOSIS — I480 Paroxysmal atrial fibrillation: Secondary | ICD-10-CM | POA: Diagnosis not present

## 2017-01-09 DIAGNOSIS — I480 Paroxysmal atrial fibrillation: Secondary | ICD-10-CM | POA: Diagnosis not present

## 2017-01-10 DIAGNOSIS — I4892 Unspecified atrial flutter: Secondary | ICD-10-CM | POA: Diagnosis not present

## 2017-01-10 DIAGNOSIS — B372 Candidiasis of skin and nail: Secondary | ICD-10-CM | POA: Diagnosis not present

## 2017-01-10 DIAGNOSIS — I480 Paroxysmal atrial fibrillation: Secondary | ICD-10-CM | POA: Diagnosis not present

## 2017-01-10 DIAGNOSIS — M80851D Other osteoporosis with current pathological fracture, right femur, subsequent encounter for fracture with routine healing: Secondary | ICD-10-CM | POA: Diagnosis not present

## 2017-01-10 DIAGNOSIS — I4891 Unspecified atrial fibrillation: Secondary | ICD-10-CM | POA: Diagnosis not present

## 2017-01-10 DIAGNOSIS — W19XXXD Unspecified fall, subsequent encounter: Secondary | ICD-10-CM | POA: Diagnosis not present

## 2017-01-10 DIAGNOSIS — I10 Essential (primary) hypertension: Secondary | ICD-10-CM | POA: Diagnosis not present

## 2017-01-11 DIAGNOSIS — R269 Unspecified abnormalities of gait and mobility: Secondary | ICD-10-CM | POA: Diagnosis not present

## 2017-01-11 DIAGNOSIS — R260 Ataxic gait: Secondary | ICD-10-CM | POA: Diagnosis not present

## 2017-01-11 DIAGNOSIS — R32 Unspecified urinary incontinence: Secondary | ICD-10-CM | POA: Diagnosis not present

## 2017-01-11 DIAGNOSIS — I480 Paroxysmal atrial fibrillation: Secondary | ICD-10-CM | POA: Diagnosis not present

## 2017-01-12 DIAGNOSIS — I480 Paroxysmal atrial fibrillation: Secondary | ICD-10-CM | POA: Diagnosis not present

## 2017-01-12 DIAGNOSIS — B372 Candidiasis of skin and nail: Secondary | ICD-10-CM | POA: Diagnosis not present

## 2017-01-12 DIAGNOSIS — I4892 Unspecified atrial flutter: Secondary | ICD-10-CM | POA: Diagnosis not present

## 2017-01-12 DIAGNOSIS — I4891 Unspecified atrial fibrillation: Secondary | ICD-10-CM | POA: Diagnosis not present

## 2017-01-12 DIAGNOSIS — W19XXXD Unspecified fall, subsequent encounter: Secondary | ICD-10-CM | POA: Diagnosis not present

## 2017-01-12 DIAGNOSIS — M80851D Other osteoporosis with current pathological fracture, right femur, subsequent encounter for fracture with routine healing: Secondary | ICD-10-CM | POA: Diagnosis not present

## 2017-01-12 DIAGNOSIS — I10 Essential (primary) hypertension: Secondary | ICD-10-CM | POA: Diagnosis not present

## 2017-01-13 DIAGNOSIS — R269 Unspecified abnormalities of gait and mobility: Secondary | ICD-10-CM | POA: Diagnosis not present

## 2017-01-13 DIAGNOSIS — M818 Other osteoporosis without current pathological fracture: Secondary | ICD-10-CM | POA: Diagnosis not present

## 2017-01-13 DIAGNOSIS — R260 Ataxic gait: Secondary | ICD-10-CM | POA: Diagnosis not present

## 2017-01-13 DIAGNOSIS — I480 Paroxysmal atrial fibrillation: Secondary | ICD-10-CM | POA: Diagnosis not present

## 2017-01-13 DIAGNOSIS — H109 Unspecified conjunctivitis: Secondary | ICD-10-CM | POA: Diagnosis not present

## 2017-01-13 DIAGNOSIS — R2681 Unsteadiness on feet: Secondary | ICD-10-CM | POA: Diagnosis not present

## 2017-01-13 DIAGNOSIS — R32 Unspecified urinary incontinence: Secondary | ICD-10-CM | POA: Diagnosis not present

## 2017-01-14 DIAGNOSIS — I480 Paroxysmal atrial fibrillation: Secondary | ICD-10-CM | POA: Diagnosis not present

## 2017-01-15 DIAGNOSIS — I480 Paroxysmal atrial fibrillation: Secondary | ICD-10-CM | POA: Diagnosis not present

## 2017-01-16 DIAGNOSIS — I480 Paroxysmal atrial fibrillation: Secondary | ICD-10-CM | POA: Diagnosis not present

## 2017-01-17 ENCOUNTER — Ambulatory Visit: Payer: Self-pay

## 2017-01-17 DIAGNOSIS — I480 Paroxysmal atrial fibrillation: Secondary | ICD-10-CM | POA: Diagnosis not present

## 2017-01-18 DIAGNOSIS — I480 Paroxysmal atrial fibrillation: Secondary | ICD-10-CM | POA: Diagnosis not present

## 2017-01-19 DIAGNOSIS — I480 Paroxysmal atrial fibrillation: Secondary | ICD-10-CM | POA: Diagnosis not present

## 2017-01-20 DIAGNOSIS — W19XXXD Unspecified fall, subsequent encounter: Secondary | ICD-10-CM | POA: Diagnosis not present

## 2017-01-20 DIAGNOSIS — I480 Paroxysmal atrial fibrillation: Secondary | ICD-10-CM | POA: Diagnosis not present

## 2017-01-20 DIAGNOSIS — B372 Candidiasis of skin and nail: Secondary | ICD-10-CM | POA: Diagnosis not present

## 2017-01-20 DIAGNOSIS — I4891 Unspecified atrial fibrillation: Secondary | ICD-10-CM | POA: Diagnosis not present

## 2017-01-20 DIAGNOSIS — I10 Essential (primary) hypertension: Secondary | ICD-10-CM | POA: Diagnosis not present

## 2017-01-20 DIAGNOSIS — M80851D Other osteoporosis with current pathological fracture, right femur, subsequent encounter for fracture with routine healing: Secondary | ICD-10-CM | POA: Diagnosis not present

## 2017-01-20 DIAGNOSIS — I4892 Unspecified atrial flutter: Secondary | ICD-10-CM | POA: Diagnosis not present

## 2017-01-21 DIAGNOSIS — I480 Paroxysmal atrial fibrillation: Secondary | ICD-10-CM | POA: Diagnosis not present

## 2017-01-22 DIAGNOSIS — I480 Paroxysmal atrial fibrillation: Secondary | ICD-10-CM | POA: Diagnosis not present

## 2017-01-23 DIAGNOSIS — I480 Paroxysmal atrial fibrillation: Secondary | ICD-10-CM | POA: Diagnosis not present

## 2017-01-24 DIAGNOSIS — I480 Paroxysmal atrial fibrillation: Secondary | ICD-10-CM | POA: Diagnosis not present

## 2017-01-25 DIAGNOSIS — B372 Candidiasis of skin and nail: Secondary | ICD-10-CM | POA: Diagnosis not present

## 2017-01-25 DIAGNOSIS — I480 Paroxysmal atrial fibrillation: Secondary | ICD-10-CM | POA: Diagnosis not present

## 2017-01-25 DIAGNOSIS — M80851D Other osteoporosis with current pathological fracture, right femur, subsequent encounter for fracture with routine healing: Secondary | ICD-10-CM | POA: Diagnosis not present

## 2017-01-25 DIAGNOSIS — I10 Essential (primary) hypertension: Secondary | ICD-10-CM | POA: Diagnosis not present

## 2017-01-25 DIAGNOSIS — I4892 Unspecified atrial flutter: Secondary | ICD-10-CM | POA: Diagnosis not present

## 2017-01-25 DIAGNOSIS — W19XXXD Unspecified fall, subsequent encounter: Secondary | ICD-10-CM | POA: Diagnosis not present

## 2017-01-25 DIAGNOSIS — I4891 Unspecified atrial fibrillation: Secondary | ICD-10-CM | POA: Diagnosis not present

## 2017-01-26 ENCOUNTER — Other Ambulatory Visit: Payer: Self-pay | Admitting: *Deleted

## 2017-01-26 DIAGNOSIS — I480 Paroxysmal atrial fibrillation: Secondary | ICD-10-CM | POA: Diagnosis not present

## 2017-01-26 NOTE — Patient Outreach (Signed)
[facility]) Care Management  01/26/2017  DORTHA NEIGHBORS 1940/11/30 [id number]   Assumed care of member from previously assigned care manager.  Call placed to member to follow up on current health status and to attempt to schedule home visit.  Spoke to niece, Drinda Butts.  She report the member is doing well.  She state member has been compliant with medications as well as MD visits as she provides transportation.  She state that the member has a home aide daily, Bonita Quin, denies needing any further in home assistance.  Report member continue to heal well from broken pelvis.  She denies any needs at this time, but does agree to home visit next week for further assessment/evaluation.  She state she will not be at the home during the visit, but home aide, Bonita Quin, will be available.  Will follow up next week with home visit, will follow up with Drinda Butts after home visit complete.  [name], [place], MSN [facility] Care Management  [facility] Manager [phone]

## 2017-01-27 DIAGNOSIS — I480 Paroxysmal atrial fibrillation: Secondary | ICD-10-CM | POA: Diagnosis not present

## 2017-01-28 DIAGNOSIS — I480 Paroxysmal atrial fibrillation: Secondary | ICD-10-CM | POA: Diagnosis not present

## 2017-01-29 DIAGNOSIS — I480 Paroxysmal atrial fibrillation: Secondary | ICD-10-CM | POA: Diagnosis not present

## 2017-01-31 DIAGNOSIS — I480 Paroxysmal atrial fibrillation: Secondary | ICD-10-CM | POA: Diagnosis not present

## 2017-02-01 ENCOUNTER — Other Ambulatory Visit: Payer: Self-pay | Admitting: Internal Medicine

## 2017-02-01 ENCOUNTER — Ambulatory Visit: Payer: Self-pay

## 2017-02-01 DIAGNOSIS — I480 Paroxysmal atrial fibrillation: Secondary | ICD-10-CM | POA: Diagnosis not present

## 2017-02-02 DIAGNOSIS — I480 Paroxysmal atrial fibrillation: Secondary | ICD-10-CM | POA: Diagnosis not present

## 2017-02-03 ENCOUNTER — Telehealth: Payer: Self-pay

## 2017-02-03 DIAGNOSIS — I480 Paroxysmal atrial fibrillation: Secondary | ICD-10-CM | POA: Diagnosis not present

## 2017-02-03 NOTE — Telephone Encounter (Signed)
Faxed [facility] care form 02/02/2017.

## 2017-02-04 ENCOUNTER — Other Ambulatory Visit: Payer: Self-pay | Admitting: *Deleted

## 2017-02-04 DIAGNOSIS — I480 Paroxysmal atrial fibrillation: Secondary | ICD-10-CM | POA: Diagnosis not present

## 2017-02-04 NOTE — Patient Outreach (Signed)
[facility] Care Management   02/04/2017  Lisa Haynes 19-Dec-1940 [id number]  Lisa Haynes is an 76 y.o. female  Subjective:   Member alert and oriented x3.  Denies complaints of pain or discomfort.  Son's girlfriend, Lisa Haynes, present during visit, report compliance with medications.  Lisa Haynes report that she, member's son, her niece Lisa Haynes, and personal care assistant Lisa Haynes all provide care/support to member.    Objective:   Review of Systems  Constitutional: Negative.   HENT: Negative.   Eyes: Negative.   Respiratory: Negative.   Cardiovascular: Negative.   Gastrointestinal: Negative.   Genitourinary: Negative.   Musculoskeletal: Negative.   Skin: Negative.   Neurological: Negative.   Endo/Heme/Allergies: Negative.   Psychiatric/Behavioral: Negative.     Physical Exam  Constitutional: She is oriented to person, place, and time. She appears well-developed and well-nourished.  Neck: Normal range of motion.  Cardiovascular: Normal rate, regular rhythm and normal heart sounds.   Respiratory: Effort normal and breath sounds normal.  GI: Soft. Bowel sounds are normal.  Musculoskeletal: Normal range of motion.  Neurological: She is alert and oriented to person, place, and time.  Skin: Skin is warm and dry.   BP 124/78 (BP Location: Left Arm, Patient Position: Sitting, Cuff Size: Normal)   Pulse 70   Resp 18   Ht 1.549 m (5' 1" )   Wt 186 lb 8 oz (84.6 kg)   SpO2 97%   BMI 35.24 kg/m   Encounter Medications:   Outpatient Encounter Prescriptions as of 02/04/2017  Medication Sig  . acetaminophen (TYLENOL) 325 MG tablet Take 2 tablets (650 mg total) by mouth every 6 (six) hours as needed for pain.  Marland Kitchen alendronate (FOSAMAX) 70 MG tablet Take 1 tablet (70 mg total) by mouth every 7 (seven) days. Take with a full glass of water on an empty stomach.  Marland Kitchen apixaban (ELIQUIS) 5 MG TABS tablet Take 1 tablet (5 mg total) by mouth 2 (two) times daily.  . cholestyramine  (QUESTRAN) 4 g packet MIX AND TAKE 1 PACKET BY MOUTH DAILY AS NEEDED (Patient taking differently: MIX AND TAKE 1 PACKET BY MOUTH DAILY AS NEEDED FOR DIARRHEA)  . Multiple Vitamins-Minerals (MULTIVITAMIN WITH MINERALS) tablet Take 1 tablet by mouth daily.  . nitroGLYCERIN (NITROSTAT) 0.4 MG SL tablet DISSOLVE 1 TABLET UNDER THE TONGUE EVERY 5 MINUTES AS NEEDED FOR CHEST PAIN  . nystatin (MYCOSTATIN/NYSTOP) powder Apply topically 3 (three) times daily. Area of rash in abdominal skin fold.  . simvastatin (ZOCOR) 10 MG tablet Take 1 tablet (10 mg total) by mouth daily.  . benzonatate (TESSALON PERLES) 100 MG capsule Take 1 capsule (100 mg total) by mouth every 6 (six) hours as needed for cough. (Patient not taking: Reported on 02/04/2017)  . Calcium Carbonate-Vitamin D (CALCARB 600/D) 600-400 MG-UNIT tablet Take 1 tablet by mouth 3 (three) times daily with meals. (Patient not taking: Reported on 02/04/2017)  . metoprolol tartrate (LOPRESSOR) 25 MG tablet Take 0.5 tablets (12.5 mg total) by mouth 2 (two) times daily.   No facility-administered encounter medications on file as of 02/04/2017.     Functional Status:   In your present state of health, do you have any difficulty performing the following activities: 12/28/2016 12/21/2016  Hearing? Y Y  Comment - -  Vision? Y Y  Comment - -  Difficulty concentrating or making decisions? Lisa Haynes  Walking or climbing stairs? Y Y  Comment - -  Dressing or bathing? Y Y  Comment - -  Doing errands, shopping? [place] and eating ? - Y  Using the Toilet? - N  In the past six months, have you accidently leaked urine? - Y  Do you have problems with loss of bowel control? - Y  Managing your Medications? - Y  Managing your Finances? - Y  Housekeeping or managing your Housekeeping? - Y  Some recent data might be hidden    Fall/Depression Screening:    Fall Risk  12/28/2016 12/21/2016 09/24/2016  Falls in the past year? Yes Yes Yes  Number  falls in past yr: 2 or more 2 or more 2 or more  Comment - - -  Injury with Fall? Yes Yes Yes  Comment - - -  Risk Factor Category  High Fall Risk - High Fall Risk  Risk for fall due to : History of fall(s);Impaired balance/gait;Impaired mobility History of fall(s);Impaired balance/gait;Impaired mobility;Impaired vision;Medication side effect History of fall(s)  Risk for fall due to: Comment - - -  Follow up Falls prevention discussed;Education provided Falls prevention discussed -   PHQ 2/9 Scores 12/28/2016 12/21/2016 09/24/2016 09/10/2016 05/25/2016 03/10/2016 12/31/2015  PHQ - 2 Score 0 0 0 0 0 0 2  PHQ- 9 Score - - - - - - 6    Assessment:    Met with member and relative at scheduled time.  Member previously involved with [facility] for safety evaluation due to multiple falls.  She report that physical therapy program was complete 2 weeks ago.  Denies fall since last admission.  State she has grab bars in bathroom around toilet, request assistance with installing grab bars in shower.  She also request assistance with installing rails outside her apartment as she has 3 steps to get in/out door.  She has requested assistance from apartment management, however they were unable to assist since member's apartment is not handicap accessible.  She is open to LCSW contact to follow up on community resources.  She already has walker and wheelchair for mobility.  Member and family report falls are their biggest concern at this time, all other chronic conditions are stable.  She has a scale for daily weights to help manage heart failure, but do not have a blood pressure monitor for regular monitoring of hypertension.  Will provide blood pressure monitor.     She denies any questions at this time.  Provided member with Lisa Haynes calendar tool book, list of community resources included.  This care manager provided member with contact information, advised to contact with concerns.  Advised of 24 hour nurse triage line,  advised to contact if assistance is needed.  Plan:   Will follow up with member with routine home visit next month.   [facility] CM Care Plan Problem One     Most Recent Value  Care Plan Problem One  patient had recent hospitalization due to a fall with injury  Role Documenting the Problem One  Care Management [place] for Problem One  Not Active  [facility] Long Term Goal   In the next 31 days, patient will have no acute admissions  [facility] Long Term Goal Start Date  12/15/16  [facility] Long Term Goal Met Date  02/04/17  Interventions for Problem One Long Term Goal  8/27 patient reports having no acute care admissions since our last encounter.    [facility] CM Short Term Goal #1   Patient will meet with RNCM for fall prevention education  [facility] CM Short  Term Goal #1 Start Date  12/15/16  [facility] CM Short Term Goal #1 Met Date  02/04/17  Interventions for Short Term Goal #1  8/27 patient reports falls since last encounter    [facility] CM Care Plan Problem Two     Most Recent Value  Care Plan Problem Two  Risk for injury related to fall as evidenced by multiple falls in the last year  Role Documenting the Problem Two  Care Management [place] for Problem Two  Active  Interventions for Problem Two Long Term Goal   Mmeber educated on fall prevention techniques such as using walker, wheelchair, not going down stairs and/or moving furniture without assistance  [place] Goal  Member will have no falls within the next 31 days  [facility] Long Term Goal Start Date  02/04/17  [facility] CM Short Term Goal #1   Member will report plan to have assistive devices in the home within the next 4 weeks  [facility] CM Short Term Goal #1 Start Date  02/04/17  Interventions for Short Term Goal #2   Referral placed to social worker, informed of need for grab bars in bathroom and rail or other assistive device outside of home for steps  [facility] CM Short Term Goal #2   Member will have appointment with primary MD scheduled within the next 4  weeks  [facility] CM Short Term Goal #2 Start Date  02/04/17  Interventions for Short Term Goal #2  Member reminded/educated on the need of 3 month follow up per last office visit note.  Advised to call to schedule appointment      Valente David, RN, MSN [place] Town Manager [phone]

## 2017-02-05 DIAGNOSIS — I480 Paroxysmal atrial fibrillation: Secondary | ICD-10-CM | POA: Diagnosis not present

## 2017-02-06 DIAGNOSIS — I480 Paroxysmal atrial fibrillation: Secondary | ICD-10-CM | POA: Diagnosis not present

## 2017-02-07 DIAGNOSIS — I480 Paroxysmal atrial fibrillation: Secondary | ICD-10-CM | POA: Diagnosis not present

## 2017-02-08 DIAGNOSIS — I480 Paroxysmal atrial fibrillation: Secondary | ICD-10-CM | POA: Diagnosis not present

## 2017-02-09 DIAGNOSIS — I480 Paroxysmal atrial fibrillation: Secondary | ICD-10-CM | POA: Diagnosis not present

## 2017-02-10 DIAGNOSIS — I480 Paroxysmal atrial fibrillation: Secondary | ICD-10-CM | POA: Diagnosis not present

## 2017-02-11 DIAGNOSIS — I480 Paroxysmal atrial fibrillation: Secondary | ICD-10-CM | POA: Diagnosis not present

## 2017-02-11 NOTE — Addendum Note (Signed)
Addended byKemper Durie on: 02/11/2017 04:24 PM   Modules accepted: Orders

## 2017-02-12 DIAGNOSIS — R32 Unspecified urinary incontinence: Secondary | ICD-10-CM | POA: Diagnosis not present

## 2017-02-12 DIAGNOSIS — R2681 Unsteadiness on feet: Secondary | ICD-10-CM | POA: Diagnosis not present

## 2017-02-12 DIAGNOSIS — I480 Paroxysmal atrial fibrillation: Secondary | ICD-10-CM | POA: Diagnosis not present

## 2017-02-12 DIAGNOSIS — M818 Other osteoporosis without current pathological fracture: Secondary | ICD-10-CM | POA: Diagnosis not present

## 2017-02-12 DIAGNOSIS — R260 Ataxic gait: Secondary | ICD-10-CM | POA: Diagnosis not present

## 2017-02-12 DIAGNOSIS — R269 Unspecified abnormalities of gait and mobility: Secondary | ICD-10-CM | POA: Diagnosis not present

## 2017-02-12 DIAGNOSIS — H109 Unspecified conjunctivitis: Secondary | ICD-10-CM | POA: Diagnosis not present

## 2017-02-13 DIAGNOSIS — I480 Paroxysmal atrial fibrillation: Secondary | ICD-10-CM | POA: Diagnosis not present

## 2017-02-14 ENCOUNTER — Other Ambulatory Visit: Payer: Self-pay | Admitting: Licensed Clinical Social Worker

## 2017-02-14 DIAGNOSIS — I480 Paroxysmal atrial fibrillation: Secondary | ICD-10-CM | POA: Diagnosis not present

## 2017-02-15 ENCOUNTER — Other Ambulatory Visit: Payer: Self-pay | Admitting: Licensed Clinical Social Worker

## 2017-02-15 DIAGNOSIS — I480 Paroxysmal atrial fibrillation: Secondary | ICD-10-CM | POA: Diagnosis not present

## 2017-02-15 NOTE — Patient Outreach (Signed)
[facility]) Care Management  02/15/2017  NYA MONDS 1940-05-22 [id number]  Assessment- CSW completed second outreach to patient and was able to successfully reach her. HIPPA verifications were provided. CSW introduced self, reason for call and of [facility] social work services. Patient is agreeable to social work assistance and is agreeable to home visit for 02/17/17. Patient reports that she DOES NOT have grab bars in the shower and needs assistance with gaining grab bars and assistance with installing them. CSW will coordinate with [employer] to see if they can assist. Patient is also looking for assistance with getting rails by the steps in front of her apartment. CSW informed her that usually [employer] cannot assist with apartments but that CSW can question about this. Patient reports that she has an aide that comes everyday except Sunday for 3 hours and that her son's girlfriend, son and niece come often to check on her as well.   CSW completed call to [employer] and left a message with [employer].  Plan-CSW will await to hear back from [employer]. CSW will complete home visit this week.  Dickie La, BSW, MSW, LCSW [employer] [employer] [email or web address] Phone: [phone] Fax: [phone]

## 2017-02-15 NOTE — Patient Outreach (Signed)
[facility]) Care Management  02/15/2017  Lisa Haynes 1941/03/17 [id number]  Assessment- CSW completed outreach call to patient on 02/15/17. Patient's aide answered and reported that patient is not awake yet and unable to come to the phone.   Plan-CSW will complete second outreach within one week.  Dickie La, BSW, MSW, LCSW [employer] [email or web address] Phone: [phone] Fax: [phone]

## 2017-02-16 ENCOUNTER — Other Ambulatory Visit: Payer: Self-pay | Admitting: Licensed Clinical Social Worker

## 2017-02-16 DIAGNOSIS — R32 Unspecified urinary incontinence: Secondary | ICD-10-CM | POA: Diagnosis not present

## 2017-02-16 DIAGNOSIS — R260 Ataxic gait: Secondary | ICD-10-CM | POA: Diagnosis not present

## 2017-02-16 DIAGNOSIS — R269 Unspecified abnormalities of gait and mobility: Secondary | ICD-10-CM | POA: Diagnosis not present

## 2017-02-16 DIAGNOSIS — I480 Paroxysmal atrial fibrillation: Secondary | ICD-10-CM | POA: Diagnosis not present

## 2017-02-16 NOTE — Patient Outreach (Signed)
[facility]) Care Management  02/16/2017  Lucilla Edinatsy J Whalin 10/26/1940 [id number]   Assessment- CSW completed another call to [facility] Hopkins with [employer] but was unable to reach her successfully. Another voice message was left requesting a return call  Plan-CSW will complete home visit tomorrow with patient.  Dickie LaBrooke Dara Camargo, BSW, MSW, LCSW [facility] Care Management [email or web address] Phone: [phone] Fax: [phone]

## 2017-02-17 ENCOUNTER — Other Ambulatory Visit: Payer: Self-pay | Admitting: Licensed Clinical Social Worker

## 2017-02-17 DIAGNOSIS — I480 Paroxysmal atrial fibrillation: Secondary | ICD-10-CM | POA: Diagnosis not present

## 2017-02-17 NOTE — Patient Outreach (Signed)
[facility]) Care Management  [facility] Social Work  02/17/2017  Lisa Haynes 1941/02/08 [id number]  Encounter Medications:  Outpatient Encounter Prescriptions as of 02/17/2017  Medication Sig  . acetaminophen (TYLENOL) 325 MG tablet Take 2 tablets (650 mg total) by mouth every 6 (six) hours as needed for pain.  Marland Kitchen alendronate (FOSAMAX) 70 MG tablet Take 1 tablet (70 mg total) by mouth every 7 (seven) days. Take with a full glass of water on an empty stomach.  Marland Kitchen apixaban (ELIQUIS) 5 MG TABS tablet Take 1 tablet (5 mg total) by mouth 2 (two) times daily.  . benzonatate (TESSALON PERLES) 100 MG capsule Take 1 capsule (100 mg total) by mouth every 6 (six) hours as needed for cough. (Patient not taking: Reported on 02/04/2017)  . Calcium Carbonate-Vitamin D (CALCARB 600/D) 600-400 MG-UNIT tablet Take 1 tablet by mouth 3 (three) times daily with meals. (Patient not taking: Reported on 02/04/2017)  . cholestyramine (QUESTRAN) 4 g packet MIX AND TAKE 1 PACKET BY MOUTH DAILY AS NEEDED (Patient taking differently: MIX AND TAKE 1 PACKET BY MOUTH DAILY AS NEEDED FOR DIARRHEA)  . metoprolol tartrate (LOPRESSOR) 25 MG tablet Take 0.5 tablets (12.5 mg total) by mouth 2 (two) times daily.  . Multiple Vitamins-Minerals (MULTIVITAMIN WITH MINERALS) tablet Take 1 tablet by mouth daily.  . nitroGLYCERIN (NITROSTAT) 0.4 MG SL tablet DISSOLVE 1 TABLET UNDER THE TONGUE EVERY 5 MINUTES AS NEEDED FOR CHEST PAIN  . nystatin (MYCOSTATIN/NYSTOP) powder Apply topically 3 (three) times daily. Area of rash in abdominal skin fold.  . simvastatin (ZOCOR) 10 MG tablet Take 1 tablet (10 mg total) by mouth daily.   No facility-administered encounter medications on file as of 02/17/2017.     Functional Status:  In your present state of health, do you have any difficulty performing the following activities: 02/17/2017 12/28/2016  Hearing? N Y  Comment - -  Vision? Y Y  Comment - -  Difficulty concentrating or  making decisions? N Y  Walking or climbing stairs? Y Y  Comment - -  Dressing or bathing? Y Y  Comment - -  Doing errands, shopping? Y Y  Comment - -  Preparing Food and eating ? - -  Using the Toilet? - -  In the past six months, have you accidently leaked urine? - -  Do you have problems with loss of bowel control? - -  Managing your Medications? - -  Managing your Finances? - -  Housekeeping or managing your Housekeeping? - -  Some recent data might be hidden    Fall/Depression Screening:  PHQ 2/9 Scores 02/17/2017 12/28/2016 12/21/2016 09/24/2016 09/10/2016 05/25/2016 03/10/2016  PHQ - 2 Score 0 0 0 0 0 0 0  PHQ- 9 Score - - - - - - -    Assessment: CSW completed home visit on 02/17/17 with patient and patient's son's girlfriend Lisa Haynes. Patient is alert and oriented x 3. CSW provided a handout of senior community resources as well as a list of transportation resources to patient. CSW completed review of resources with family. Family are not comfortable with patient traveling alone and therefore son will provide transportation for patient to medical appointments. Patient report's that her niece was providing personal care assistance, managing her finances and also providing transportation for her but that there was a conflict with her niece and niece is no longer involved in patient's care. Patient reports that her son goes grocery shopping for her. She shares that she usually has  one person with her at all times whether it be her son, Lisa StanleyLisa and Lisa Haynes Lisa Haynes. Patient repots having both a walker and wheelchair. Patient's son's girlfriend reported that she fills patient's medication box. Patient has grab bars by her toilet in the bathroom but needs grab bars in the shower as well. CSW will assist patient with gaining this. Family is aware that CSW may not be able to assist with getting a ramp outside of her steps as they live in an apartment that is not wheelchair accessible. CSW completed call to [facility]  Hopkins with [employer] but was unable to reach her. CSW left another voice message encouraging a return call in order to make referral for patient. Patient and family appreciative of home visit and resource review.   Plan: CSW will await to hear back from [employer] and then will update family. If CSW has not heard back from [employer] by 02/23/17, then another outreach attempt will be made.  [facility] CM Care Plan Problem One     Most Recent Value  Care Plan Problem One  Lack of medical equipment within the home to help support patient as well as lack of community resource knowledge.  Role Documenting the Problem One  Clinical Social Worker  Care Plan for Problem One  Active  [facility] Long [facility] Goal   Patient will successfully be referred to [employer] and gain grab bars in the shower within 90 days  [facility] Long Term Goal Start Date  02/17/17  Interventions for Problem One Long Term Goal  CSW has completed home visit and has contacted [employer]. CSW will await to hear back from [facility] in order to place referral successfully.  [facility] CM Short Term Goal #1   Patient will attend all scheduled medical appointment over the next 30 days  [facility] CM Short Term Goal #1 Start Date  02/17/17  Interventions for Short Term Goal #1  CSW provided handout on transportation resources. Patient's son has agreed to start providing transportation to patient. CSW will follow up as needed and support patient.     Dickie LaBrooke Adaria Hole, BSW, MSW, LCSW [facility] Care Management [name] Phone: [phone] Fax: [phone]

## 2017-02-18 DIAGNOSIS — I480 Paroxysmal atrial fibrillation: Secondary | ICD-10-CM | POA: Diagnosis not present

## 2017-02-19 DIAGNOSIS — I480 Paroxysmal atrial fibrillation: Secondary | ICD-10-CM | POA: Diagnosis not present

## 2017-02-20 DIAGNOSIS — I480 Paroxysmal atrial fibrillation: Secondary | ICD-10-CM | POA: Diagnosis not present

## 2017-02-21 ENCOUNTER — Other Ambulatory Visit: Payer: Self-pay | Admitting: Licensed Clinical Social Worker

## 2017-02-21 ENCOUNTER — Other Ambulatory Visit: Payer: Self-pay | Admitting: *Deleted

## 2017-02-21 DIAGNOSIS — I480 Paroxysmal atrial fibrillation: Secondary | ICD-10-CM | POA: Diagnosis not present

## 2017-02-21 NOTE — Patient Outreach (Signed)
[facility]) Care Management  02/21/2017  Lucilla Edinatsy J [place]up Jan 03, 1941 [id number]  Assessment- CSW received return call from [facility] with [employer]. CSW was informed that they are not able to work on any [place] [employer] properties and are not able to assist with installing grab bars into patient's shower. CSW sent secure message with update to [facility] RNCM.  Plan-CSW will continue to provide social work assistance and support and will consider other options for installation of grab bars.  Dickie LaBrooke Lavona Norsworthy, BSW, MSW, LCSW [facility] Care Management [email or web address] Phone: [phone] Fax: [phone]

## 2017-02-21 NOTE — Patient Outreach (Signed)
[facility]) Care Management  02/21/2017  Lisa Haynes April 07, 1941 [id number]   Call placed to member to follow up on contact with LCSW.  Spoke to member as well as her son's girlfriend, Lisa Haynes.  Advised that they will be unable to get railing placed at the front door nor will they be able to get grab bars placed in the shower.  Member has remained fall free since last visit.  They deny any urgent concerns at this time.  Routine home visit scheduled for next month.  Will follow up at that time.  Lisa Haynes, [place]RN, MSN [facility] Care Management  [facility] Care Manager [phone]

## 2017-02-22 DIAGNOSIS — I480 Paroxysmal atrial fibrillation: Secondary | ICD-10-CM | POA: Diagnosis not present

## 2017-02-23 ENCOUNTER — Other Ambulatory Visit: Payer: Self-pay | Admitting: Licensed Clinical Social Worker

## 2017-02-23 DIAGNOSIS — I480 Paroxysmal atrial fibrillation: Secondary | ICD-10-CM | POA: Diagnosis not present

## 2017-02-23 NOTE — Patient Outreach (Signed)
[facility]) Care Management  02/23/2017  Lisa Haynes Oct 17, 1940 [id number]  Assessment- CSW received return call from patient's son who also lives with patient. He reports that patient is currently asleep. Son provided HIPPA verifications. CSW informed son that CSW is unable to assist with getting grab bars installed in the shower. CSW explained that [employer] is not able to provide any assistance to those that live in [place] [place] [employer] [employer] properties. Son expressed understanding but is very discouraged. CSW suggested that family go through the apartment agency to gain support and assistance. He reports that he has already tried this and they were not able to assist with installing rails by the steps in front of the apartment because the apartment is not wheelchair accessible. CSW provided education on [facility] that is able to assist those in need with getting ramps. However, son was informed that the wait list is very long and that CSW cannot contact agency for them as they have to hear from the patient in order to complete referral. Son expressed understanding and is appreciative of resource information. CSW will mail patient information on the [facility] [employer] and their "Rampin Up" program. CSW informed family that she will be completing social work discharge at this time and family is agreeable.  Plan-CSW will update [facility] RNCM on social work discharge. CSW will send request to [facility] Care Management Assistant to mail out requested resources. CSW will update PCP.  Dickie LaBrooke Kase Shughart, BSW, MSW, LCSW [facility] Care Management [email or web address] Phone: [phone] Fax: [phone]

## 2017-02-23 NOTE — Patient Outreach (Signed)
Request received from Brooke Joyce, LCSW to mail patient personal care resources.  Information mailed today. 

## 2017-02-23 NOTE — Patient Outreach (Signed)
[facility]) Care Management  02/23/2017  Lucilla Edinatsy J Punch [birth year] [id number]  Assessment-CSW completed outreach attempt today to provide update that [employer] are not able to provide any assistance to those that live in [employer] [employer] properties. CSW unable to reach patient successfully. CSW left a HIPPA compliant voice message encouraging patient  to return call once available.  Plan-CSW will await return call or complete an additional outreach if needed.  Dickie LaBrooke Amyri Frenz, BSW, MSW, LCSW [facility] Care Management [email or web address] Phone: [phone] Fax: [phone]

## 2017-02-24 DIAGNOSIS — I480 Paroxysmal atrial fibrillation: Secondary | ICD-10-CM | POA: Diagnosis not present

## 2017-02-25 DIAGNOSIS — I480 Paroxysmal atrial fibrillation: Secondary | ICD-10-CM | POA: Diagnosis not present

## 2017-02-26 DIAGNOSIS — I480 Paroxysmal atrial fibrillation: Secondary | ICD-10-CM | POA: Diagnosis not present

## 2017-02-27 DIAGNOSIS — I480 Paroxysmal atrial fibrillation: Secondary | ICD-10-CM | POA: Diagnosis not present

## 2017-02-28 DIAGNOSIS — I480 Paroxysmal atrial fibrillation: Secondary | ICD-10-CM | POA: Diagnosis not present

## 2017-03-01 DIAGNOSIS — I480 Paroxysmal atrial fibrillation: Secondary | ICD-10-CM | POA: Diagnosis not present

## 2017-03-03 ENCOUNTER — Other Ambulatory Visit: Payer: Self-pay | Admitting: *Deleted

## 2017-03-03 DIAGNOSIS — I480 Paroxysmal atrial fibrillation: Secondary | ICD-10-CM | POA: Diagnosis not present

## 2017-03-03 NOTE — Patient Outreach (Signed)
[facility]) Care Management   03/03/2017  Lisa Haynes 1940-07-02 [id number]  Lisa Haynes is an 76 y.o. female  Subjective:   Member alert and oriented x3, however has memory issues as she does not recall previously meeting this Transport planner.  She denies pain at this time, son's girlfriend Lattie Haw present report member is compliant with medications.  She denies any falls.  Objective:   Review of Systems  Constitutional: Negative.   HENT: Negative.   Eyes: Negative.   Respiratory: Negative.   Cardiovascular: Negative.   Gastrointestinal: Negative.   Genitourinary: Negative.   Musculoskeletal: Negative.   Skin: Negative.   Neurological: Negative.   Endo/Heme/Allergies: Negative.   Psychiatric/Behavioral: Negative.     Physical Exam  Constitutional: She is oriented to person, place, and time. She appears well-developed and well-nourished.  Neck: Normal range of motion.  Cardiovascular: Normal rate, regular rhythm and normal heart sounds.   Respiratory: Effort normal and breath sounds normal.  GI: Soft. Bowel sounds are normal.  Musculoskeletal: Normal range of motion.  Neurological: She is alert and oriented to person, place, and time.  Skin: Skin is warm and dry.   BP 116/78 (BP Location: Left Arm, Patient Position: Sitting, Cuff Size: Normal)   Pulse (!) 57   Resp 18   SpO2 97%    Encounter Medications:   Outpatient Encounter Prescriptions as of 03/03/2017  Medication Sig  . acetaminophen (TYLENOL) 325 MG tablet Take 2 tablets (650 mg total) by mouth every 6 (six) hours as needed for pain.  Marland Kitchen alendronate (FOSAMAX) 70 MG tablet Take 1 tablet (70 mg total) by mouth every 7 (seven) days. Take with a full glass of water on an empty stomach.  Marland Kitchen apixaban (ELIQUIS) 5 MG TABS tablet Take 1 tablet (5 mg total) by mouth 2 (two) times daily.  . benzonatate (TESSALON PERLES) 100 MG capsule Take 1 capsule (100 mg total) by mouth every 6 (six) hours as needed for  cough. (Patient not taking: Reported on 02/04/2017)  . Calcium Carbonate-Vitamin D (CALCARB 600/D) 600-400 MG-UNIT tablet Take 1 tablet by mouth 3 (three) times daily with meals. (Patient not taking: Reported on 02/04/2017)  . cholestyramine (QUESTRAN) 4 g packet MIX AND TAKE 1 PACKET BY MOUTH DAILY AS NEEDED (Patient taking differently: MIX AND TAKE 1 PACKET BY MOUTH DAILY AS NEEDED FOR DIARRHEA)  . metoprolol tartrate (LOPRESSOR) 25 MG tablet Take 0.5 tablets (12.5 mg total) by mouth 2 (two) times daily.  . Multiple Vitamins-Minerals (MULTIVITAMIN WITH MINERALS) tablet Take 1 tablet by mouth daily.  . nitroGLYCERIN (NITROSTAT) 0.4 MG SL tablet DISSOLVE 1 TABLET UNDER THE TONGUE EVERY 5 MINUTES AS NEEDED FOR CHEST PAIN  . nystatin (MYCOSTATIN/NYSTOP) powder Apply topically 3 (three) times daily. Area of rash in abdominal skin fold.  . simvastatin (ZOCOR) 10 MG tablet Take 1 tablet (10 mg total) by mouth daily.   No facility-administered encounter medications on file as of 03/03/2017.     Functional Status:   In your present state of health, do you have any difficulty performing the following activities: 02/17/2017 12/28/2016  Hearing? N Y  [place]? Y Y  Comment - -  Difficulty concentrating or making decisions? N Y  Walking or climbing stairs? Y Y  Comment - -  Dressing or bathing? Y Y  Comment - -  Doing errands, shopping? Y Y  Comment - -  Preparing Food and eating ? - -  Using the Toilet? - -  In the past six months, have you accidently leaked urine? - -  Do you have problems with loss of bowel control? - -  Managing your Medications? - -  Managing your Finances? - -  Housekeeping or managing your Housekeeping? - -  Some recent data might be hidden    Fall/Depression Screening:    Fall Risk  02/17/2017 02/17/2017 12/28/2016  Falls in the past year? - Yes Yes  Number falls in past yr: 2 or more 1 2 or more  Comment - - -  Injury with Fall? Yes Yes Yes  Comment - - -   Risk Factor Category  - High Fall Risk High Fall Risk  Risk for fall due to : - History of fall(s);Impaired balance/gait;Impaired mobility History of fall(s);Impaired balance/gait;Impaired mobility  Risk for fall due to: Comment - - -  Follow up - Falls evaluation completed Falls prevention discussed;Education provided   [facility] 2/9 Scores 02/17/2017 12/28/2016 12/21/2016 09/24/2016 09/10/2016 05/25/2016 03/10/2016  PHQ - 2 Score 0 0 0 0 0 0 0  PHQ- 9 Score - - - - - - -    Assessment:    Met with member at scheduled time.  Noted that member answered door without use of walker.  She verbalizes understanding of importance of using at all time.    Provided with BP monitor as requested during last month's visit.  Educated [place] on proper use of equipment.  Noted that member does not have follow up appointment scheduled with primary MD.  She report that her niece is not providing transportation for her at this time due to a disagreement, however her son will be able to take her once the appointment is made.  Lattie Haw will discuss with son and make appointment according to his schedule.    Both member and Lattie Haw deny any concerns at this time, advised to contact this care manager with questions.  Plan:   Will follow up with member next month.   [facility] CM Care Plan Problem Two     Most Recent Value  Care Plan Problem Two  Risk for injury related to fall as evidenced by multiple falls in the last year  Role Documenting the Problem Two  Care Management [place] for Problem Two  Active  Interventions for Problem Two Long Term Goal   Mmeber educated on fall prevention techniques such as using walker, wheelchair, not going down stairs and/or moving furniture without assistance  [place] Goal  Member will have no falls within the next 31 days  [facility] Long Term Goal Start Date  02/04/17  [facility] CM Short Term Goal #1   Member will report plan to have assistive devices in the home within the next 4 weeks   [facility] CM Short Term Goal #1 Start Date  02/04/17  [facility] CM Short Term Goal #1 Met Date   03/03/17  Interventions for Short Term Goal #2   Referral placed to social worker, informed of need for grab bars in bathroom and rail or other assistive device outside of home for steps  [facility] CM Short Term Goal #2   Member will have appointment with primary MD scheduled within the next 4 weeks  [facility] CM Short Term Goal #2 Start Date  02/04/17  Interventions for Short Term Goal #2  Provided son's girlfriend with contact information for member's primary MD.  Educated on the importance of attending follow up appontments in order to manage chronic conditions  Valente David, [place], MSN [place] [phone]

## 2017-03-04 DIAGNOSIS — I480 Paroxysmal atrial fibrillation: Secondary | ICD-10-CM | POA: Diagnosis not present

## 2017-03-05 DIAGNOSIS — I480 Paroxysmal atrial fibrillation: Secondary | ICD-10-CM | POA: Diagnosis not present

## 2017-03-06 DIAGNOSIS — I480 Paroxysmal atrial fibrillation: Secondary | ICD-10-CM | POA: Diagnosis not present

## 2017-03-07 DIAGNOSIS — I480 Paroxysmal atrial fibrillation: Secondary | ICD-10-CM | POA: Diagnosis not present

## 2017-03-08 ENCOUNTER — Encounter: Payer: Medicare HMO | Admitting: Internal Medicine

## 2017-03-08 DIAGNOSIS — I480 Paroxysmal atrial fibrillation: Secondary | ICD-10-CM | POA: Diagnosis not present

## 2017-03-09 DIAGNOSIS — I480 Paroxysmal atrial fibrillation: Secondary | ICD-10-CM | POA: Diagnosis not present

## 2017-03-10 DIAGNOSIS — I480 Paroxysmal atrial fibrillation: Secondary | ICD-10-CM | POA: Diagnosis not present

## 2017-03-11 DIAGNOSIS — I480 Paroxysmal atrial fibrillation: Secondary | ICD-10-CM | POA: Diagnosis not present

## 2017-03-12 DIAGNOSIS — I480 Paroxysmal atrial fibrillation: Secondary | ICD-10-CM | POA: Diagnosis not present

## 2017-03-13 DIAGNOSIS — I480 Paroxysmal atrial fibrillation: Secondary | ICD-10-CM | POA: Diagnosis not present

## 2017-03-14 DIAGNOSIS — I480 Paroxysmal atrial fibrillation: Secondary | ICD-10-CM | POA: Diagnosis not present

## 2017-03-15 DIAGNOSIS — R32 Unspecified urinary incontinence: Secondary | ICD-10-CM | POA: Diagnosis not present

## 2017-03-15 DIAGNOSIS — I480 Paroxysmal atrial fibrillation: Secondary | ICD-10-CM | POA: Diagnosis not present

## 2017-03-15 DIAGNOSIS — H109 Unspecified conjunctivitis: Secondary | ICD-10-CM | POA: Diagnosis not present

## 2017-03-15 DIAGNOSIS — R269 Unspecified abnormalities of gait and mobility: Secondary | ICD-10-CM | POA: Diagnosis not present

## 2017-03-15 DIAGNOSIS — R260 Ataxic gait: Secondary | ICD-10-CM | POA: Diagnosis not present

## 2017-03-15 DIAGNOSIS — R2681 Unsteadiness on feet: Secondary | ICD-10-CM | POA: Diagnosis not present

## 2017-03-15 DIAGNOSIS — M818 Other osteoporosis without current pathological fracture: Secondary | ICD-10-CM | POA: Diagnosis not present

## 2017-03-16 ENCOUNTER — Encounter: Payer: Self-pay | Admitting: *Deleted

## 2017-03-16 DIAGNOSIS — I480 Paroxysmal atrial fibrillation: Secondary | ICD-10-CM | POA: Diagnosis not present

## 2017-03-17 DIAGNOSIS — I480 Paroxysmal atrial fibrillation: Secondary | ICD-10-CM | POA: Diagnosis not present

## 2017-03-18 DIAGNOSIS — I480 Paroxysmal atrial fibrillation: Secondary | ICD-10-CM | POA: Diagnosis not present

## 2017-03-19 DIAGNOSIS — I480 Paroxysmal atrial fibrillation: Secondary | ICD-10-CM | POA: Diagnosis not present

## 2017-03-20 DIAGNOSIS — I480 Paroxysmal atrial fibrillation: Secondary | ICD-10-CM | POA: Diagnosis not present

## 2017-03-21 DIAGNOSIS — R32 Unspecified urinary incontinence: Secondary | ICD-10-CM | POA: Diagnosis not present

## 2017-03-21 DIAGNOSIS — R260 Ataxic gait: Secondary | ICD-10-CM | POA: Diagnosis not present

## 2017-03-21 DIAGNOSIS — R269 Unspecified abnormalities of gait and mobility: Secondary | ICD-10-CM | POA: Diagnosis not present

## 2017-03-21 DIAGNOSIS — I480 Paroxysmal atrial fibrillation: Secondary | ICD-10-CM | POA: Diagnosis not present

## 2017-03-22 DIAGNOSIS — I480 Paroxysmal atrial fibrillation: Secondary | ICD-10-CM | POA: Diagnosis not present

## 2017-03-23 DIAGNOSIS — I480 Paroxysmal atrial fibrillation: Secondary | ICD-10-CM | POA: Diagnosis not present

## 2017-03-24 DIAGNOSIS — I480 Paroxysmal atrial fibrillation: Secondary | ICD-10-CM | POA: Diagnosis not present

## 2017-03-25 DIAGNOSIS — I480 Paroxysmal atrial fibrillation: Secondary | ICD-10-CM | POA: Diagnosis not present

## 2017-03-26 DIAGNOSIS — I480 Paroxysmal atrial fibrillation: Secondary | ICD-10-CM | POA: Diagnosis not present

## 2017-03-27 DIAGNOSIS — I480 Paroxysmal atrial fibrillation: Secondary | ICD-10-CM | POA: Diagnosis not present

## 2017-03-28 DIAGNOSIS — I480 Paroxysmal atrial fibrillation: Secondary | ICD-10-CM | POA: Diagnosis not present

## 2017-03-29 ENCOUNTER — Encounter: Payer: Medicare HMO | Admitting: Internal Medicine

## 2017-03-29 DIAGNOSIS — I480 Paroxysmal atrial fibrillation: Secondary | ICD-10-CM | POA: Diagnosis not present

## 2017-03-30 DIAGNOSIS — I480 Paroxysmal atrial fibrillation: Secondary | ICD-10-CM | POA: Diagnosis not present

## 2017-03-31 ENCOUNTER — Other Ambulatory Visit: Payer: Self-pay | Admitting: *Deleted

## 2017-03-31 DIAGNOSIS — K529 Noninfective gastroenteritis and colitis, unspecified: Secondary | ICD-10-CM | POA: Diagnosis not present

## 2017-03-31 NOTE — Patient Outreach (Addendum)
[place] [facility]) Care Management  03/31/2017  Lisa Haynes 1940/07/11 [id number]   Call placed to member to follow up on current health condition and appointment with primary MD.  Noted that member has had 2 appointments canceled this month due to lack of transportation.  Spoke to [employer] son, Lisa Haynes.  Identity verified.  He report that the member is doing well, no falls.  This care manager inquired about member's missed appointments, he confirms she has not had transportation.  This care manager attempted to discuss options, including involving LCSW for assistance, but he state he is in the middle of something and will have to end the conversation.  He does however agree to referral prior to ending the call.  Will place referral to LCSW and follow up with member/son within the next 2 weeks.   [facility] CM Care Plan Problem Two     Most Recent Value  Care Plan Problem Two  Risk for injury related to fall as evidenced by multiple falls in the last year  Role Documenting the Problem Two  Care Management [place] for Problem Two  Active  Interventions for Problem Two Long Term Goal   Mmeber educated on fall prevention techniques such as using walker, wheelchair, not going down stairs and/or moving furniture without assistance  [facility] Long Term Goal  Member will have no falls within the next 31 days  [facility] Long Term Goal Start Date  02/04/17  [facility] Long Term Goal Met Date  03/31/17  [facility] CM Short Term Goal #1   Member will report plan to have assistive devices in the home within the next 4 weeks  [facility] CM Short Term Goal #1 Start Date  02/04/17  [facility] CM Short Term Goal #1 Met Date   03/03/17  Interventions for Short Term Goal #2   Referral placed to social worker, informed of need for grab bars in bathroom and rail or other assistive device outside of home for steps  [facility] CM Short Term Goal #2   Member will have appointment with primary MD scheduled within the next 4 weeks  [facility] CM  Short Term Goal #2 Start Date  03/31/17  Interventions for Short Term Goal #2  Referral placed to LCSW to assist with transportation       Valente David, Therapist, sports, MSN [place] [phone]

## 2017-04-01 ENCOUNTER — Other Ambulatory Visit: Payer: Self-pay | Admitting: Licensed Clinical Social Worker

## 2017-04-01 NOTE — Patient Outreach (Signed)
[facility]) Care Management  04/01/2017  Lucilla Edinatsy J Rizzo 1941-02-04 [id number]  Assessment-CSW completed outreach attempt today after receiving new referral for transportation assistance. CSW unable to reach patient or son successfully. CSW unable to leave a voice message.   Plan-CSW will await return call or complete an additional outreach within one week.   Dickie LaBrooke Santiaga Butzin, BSW, MSW, LCSW [facility] Care Management [email or web address] Phone: [phone] Fax: [phone]

## 2017-04-01 NOTE — Addendum Note (Signed)
Addended byKemper Durie: Edy Belt on: 04/01/2017 12:54 PM   Modules accepted: Orders

## 2017-04-02 DIAGNOSIS — K529 Noninfective gastroenteritis and colitis, unspecified: Secondary | ICD-10-CM | POA: Diagnosis not present

## 2017-04-03 DIAGNOSIS — K529 Noninfective gastroenteritis and colitis, unspecified: Secondary | ICD-10-CM | POA: Diagnosis not present

## 2017-04-04 ENCOUNTER — Other Ambulatory Visit: Payer: Self-pay | Admitting: Licensed Clinical Social Worker

## 2017-04-04 DIAGNOSIS — K529 Noninfective gastroenteritis and colitis, unspecified: Secondary | ICD-10-CM | POA: Diagnosis not present

## 2017-04-04 NOTE — Patient Outreach (Signed)
[facility]) Care Management  [facility]  04/04/2017  Lisa Haynes 04/12/1941 [id number]  Encounter Medications:  Outpatient Encounter Medications as of 04/04/2017  Medication Sig  . acetaminophen (TYLENOL) 325 MG tablet Take 2 tablets (650 mg total) by mouth every 6 (six) hours as needed for pain.  Marland Kitchen. alendronate (FOSAMAX) 70 MG tablet Take 1 tablet (70 mg total) by mouth every 7 (seven) days. Take with a full glass of water on an empty stomach.  Marland Kitchen. apixaban (ELIQUIS) 5 MG TABS tablet Take 1 tablet (5 mg total) by mouth 2 (two) times daily.  . benzonatate (TESSALON PERLES) 100 MG capsule Take 1 capsule (100 mg total) by mouth every 6 (six) hours as needed for cough. (Patient not taking: Reported on 02/04/2017)  . Calcium Carbonate-Vitamin D (CALCARB 600/D) 600-400 MG-UNIT tablet Take 1 tablet by mouth 3 (three) times daily with meals. (Patient not taking: Reported on 02/04/2017)  . cholestyramine (QUESTRAN) 4 g packet MIX AND TAKE 1 PACKET BY MOUTH DAILY AS NEEDED (Patient taking differently: MIX AND TAKE 1 PACKET BY MOUTH DAILY AS NEEDED FOR DIARRHEA)  . metoprolol tartrate (LOPRESSOR) 25 MG tablet Take 0.5 tablets (12.5 mg total) by mouth 2 (two) times daily.  . Multiple Vitamins-Minerals (MULTIVITAMIN WITH MINERALS) tablet Take 1 tablet by mouth daily.  . nitroGLYCERIN (NITROSTAT) 0.4 MG SL tablet DISSOLVE 1 TABLET UNDER THE TONGUE EVERY 5 MINUTES AS NEEDED FOR CHEST PAIN  . nystatin (MYCOSTATIN/NYSTOP) powder Apply topically 3 (three) times daily. Area of rash in abdominal skin fold.  . simvastatin (ZOCOR) 10 MG tablet Take 1 tablet (10 mg total) by mouth daily.   No facility-administered encounter medications on file as of 04/04/2017.     Functional Status:  In your present state of health, do you have any difficulty performing the following activities: 02/17/2017 12/28/2016  Hearing? N Y  Comment - -  [place]? Y Y  Comment - -  Difficulty concentrating or  making decisions? N Y  Walking or climbing stairs? Y Y  Comment - -  Dressing or bathing? Y Y  Comment - -  Doing errands, shopping? Y Y  Comment - -  Preparing Food and eating ? - -  Using the Toilet? - -  In the past six months, have you accidently leaked urine? - -  Do you have problems with loss of bowel control? - -  Managing your Medications? - -  Managing your Finances? - -  Housekeeping or managing your Housekeeping? - -  Some recent data might be hidden    Fall/Depression Screening:  PHQ 2/9 Scores 02/17/2017 12/28/2016 12/21/2016 09/24/2016 09/10/2016 05/25/2016 03/10/2016  PHQ - 2 Score 0 0 0 0 0 0 0  PHQ- 9 Score - - - - - - -    Assessment: CSW completed second outreach call to patient's residence on 04/04/17 and was able to reach patient successfully. HIPPA verifications provided. CSW introduced self, reason for call and of [facility] social work services. Home visit was scheduled for today with family.  CSW arrived at patient's residence. Patient, son and son's girlfriend [name] were all present during home visit. Family is in need of transportation assistance. CSW provided family with a printed handout of transportation resources Restaurant manager, fast food(Senior Whe[employer], [employer], private pay) and a handout on [employer] Medicare transportation benefits. CSW completed entire review of transportation resources with family. However, family does not wish for patient to gain SCAT or Interior and spatial designerenior Wheels services. Family report that patient's dementia would  cause her to get lost if she went on her to these appointments through SCAT and [employer]. CSW informed family that patient can always take a friend, aide or family member with her to all appointments through ([employer], Forensic psychologistcat or [employer]ior Whe[employer].) Family state that they would prefer to hold on to these transportation resources and to use them as a last resort if ever needed. Son states that he is very particular about his mother's care and would prefer that someone transport patient to  her appointments instead of a Zenaida Niecevan dropping her off. Son works full-time but family has agreed to schedule patient's appointments around son's girlfriend Tina's schedule so that [name] can drive patient to her appointments and wait with her. Patient has an appointment tomorrow to get eliquis refilled but both [name] and son are not able to take her. CSW completed call for family and rescheduled this appointment for 04/08/17. [name] is agreeable to transport patient to this appointment. CSW suggested that family ask patient's PCS aide if she can also provide assistance with transportation as she comes 5 days per week for up to 3 hours. Family agreeable to ask this. Family was very appreciative of home visit and transportation resource education provided. Family denies needing any further social work assistance and is agreeable to social work discharge.  Plan: CSW will not open case. CSW will update [facility] RNCM.  Dickie LaBrooke Soyla Bainter, BSW, MSW, LCSW [facility] Care Management [email or web address] Phone: [phone] Fax: [phone]

## 2017-04-05 ENCOUNTER — Ambulatory Visit: Payer: Medicare HMO

## 2017-04-05 DIAGNOSIS — K529 Noninfective gastroenteritis and colitis, unspecified: Secondary | ICD-10-CM | POA: Diagnosis not present

## 2017-04-06 DIAGNOSIS — K529 Noninfective gastroenteritis and colitis, unspecified: Secondary | ICD-10-CM | POA: Diagnosis not present

## 2017-04-07 DIAGNOSIS — K529 Noninfective gastroenteritis and colitis, unspecified: Secondary | ICD-10-CM | POA: Diagnosis not present

## 2017-04-08 ENCOUNTER — Other Ambulatory Visit: Payer: Self-pay

## 2017-04-08 ENCOUNTER — Encounter: Payer: Self-pay | Admitting: Internal Medicine

## 2017-04-08 ENCOUNTER — Ambulatory Visit (INDEPENDENT_AMBULATORY_CARE_PROVIDER_SITE_OTHER): Payer: Medicare HMO | Admitting: Internal Medicine

## 2017-04-08 VITALS — BP 145/64 | HR 55 | Temp 97.5°F | Ht 61.0 in | Wt 196.9 lb

## 2017-04-08 DIAGNOSIS — Z79899 Other long term (current) drug therapy: Secondary | ICD-10-CM | POA: Diagnosis not present

## 2017-04-08 DIAGNOSIS — R32 Unspecified urinary incontinence: Secondary | ICD-10-CM

## 2017-04-08 DIAGNOSIS — Z8781 Personal history of (healed) traumatic fracture: Secondary | ICD-10-CM | POA: Diagnosis not present

## 2017-04-08 DIAGNOSIS — M1990 Unspecified osteoarthritis, unspecified site: Secondary | ICD-10-CM | POA: Diagnosis not present

## 2017-04-08 DIAGNOSIS — Z6837 Body mass index (BMI) 37.0-37.9, adult: Secondary | ICD-10-CM

## 2017-04-08 DIAGNOSIS — I4892 Unspecified atrial flutter: Secondary | ICD-10-CM | POA: Diagnosis not present

## 2017-04-08 DIAGNOSIS — K529 Noninfective gastroenteritis and colitis, unspecified: Secondary | ICD-10-CM | POA: Diagnosis not present

## 2017-04-08 DIAGNOSIS — E669 Obesity, unspecified: Secondary | ICD-10-CM

## 2017-04-08 DIAGNOSIS — I10 Essential (primary) hypertension: Secondary | ICD-10-CM

## 2017-04-08 DIAGNOSIS — F05 Delirium due to known physiological condition: Secondary | ICD-10-CM

## 2017-04-08 DIAGNOSIS — F418 Other specified anxiety disorders: Secondary | ICD-10-CM

## 2017-04-08 DIAGNOSIS — F0390 Unspecified dementia without behavioral disturbance: Secondary | ICD-10-CM

## 2017-04-08 DIAGNOSIS — Z7901 Long term (current) use of anticoagulants: Secondary | ICD-10-CM

## 2017-04-08 MED ORDER — CALCIUM CARBONATE-VITAMIN D 600-400 MG-UNIT PO TABS: 1.0000 | ORAL_TABLET | Freq: Two times a day (BID) | ORAL | 3 refills | Status: DC

## 2017-04-08 MED ORDER — SIMVASTATIN 10 MG PO TABS: 10.0000 mg | ORAL_TABLET | Freq: Every day | ORAL | 4 refills | Status: DC

## 2017-04-08 MED ORDER — APIXABAN 5 MG PO TABS: 5.0000 mg | ORAL_TABLET | Freq: Two times a day (BID) | ORAL | 3 refills | Status: DC

## 2017-04-08 MED ORDER — METOPROLOL TARTRATE 25 MG PO TABS: 12.5000 mg | ORAL_TABLET | Freq: Two times a day (BID) | ORAL | 2 refills | Status: AC

## 2017-04-08 MED ORDER — CHOLESTYRAMINE 4 G PO PACK: PACK | ORAL | 6 refills | Status: DC

## 2017-04-08 NOTE — Progress Notes (Signed)
   CC: Medication refills  HPI:  [name] is a 76 y.o. with PMH of HTN, OA, depression, and dementia who presents today for management of chronic medical conditions and medication refills. Patient presents with son's girlfriend who is one of her primary caregivers. The patient and caregiver state that the patient has run out of medications and needs refills for some of them. The caregiver is unsure of what medications the patient takes on regular basis, but brought alendronate, metoprolol, and Eliquis bottles with her. She thinks the patient takes these as prescribed because she helps set up the patient's pill box. The patient has a full body of sertraline with her. This medication is not on her current medication list and she has not taken it for some time. Patient and caregiver are confused about this medication and wonder if she has to take it even though she hasn't taken it for a few months (it seems this was discontinued at prior hospitalization for hip fracture, as it fell of medication list after admission to hospital).   Past Medical History: Past Medical History:  Diagnosis Date  . Depression   . GERD (gastroesophageal reflux disease)   . High cholesterol   . Hypertension   . Osteoarthritis    "legs" (11/11/2015)  . Osteoporosis   . Paroxysmal atrial fibrillation (HCC) 08/20/2014  . Urinary incontinence    Review of Systems:  Patient endorses intermittent confusion and incontinence, both at baseline Patient denies chest pain, shortness of breath, abdominal pain, diaphoresis, nausea/vomiting, lower extremity swelling, and change in bowel/bladder habits.  Physical Exam:  Vitals:   04/08/17 1052  BP: (!) 172/70  Pulse: 61  Temp: (!) 97.5 F (36.4 C)  TempSrc: Oral  SpO2: 98%  Weight: 196 lb 14.4 oz (89.3 kg)  Height: 5\' 1"  (1.549 m)   Physical Exam  Constitutional:  Obese woman sitting comfortably in chair in no acute distress  HENT:  Mouth/Throat: Oropharynx is  clear and moist.  Cardiovascular: Normal rate and regular rhythm. Exam reveals no friction rub.  No murmur heard. 2+ radial and dorsalis pedis bilaterally  Respiratory: Effort normal and breath sounds normal. No respiratory distress. She has no wheezes.  No crackles. Good air movement throughout.  GI: Soft. Bowel sounds are normal. She exhibits no distension. There is no tenderness.  Musculoskeletal: She exhibits no edema (of bilateral lower extremities) or tenderness (of bilateral lower extremities).  Lymphadenopathy:    She has no cervical adenopathy.  Skin: Skin is warm and dry. No rash noted. No erythema.  Patient reported previous rash in abdominal skin folds and wanted examination of this area. Some erythema, but no evidence of current infection.   Assessment & Plan:   See Encounters Tab for problem based charting.  Patient seen with Dr. Rogelia BogaButcher

## 2017-04-08 NOTE — Progress Notes (Signed)
Internal Medicine Clinic Attending  I saw and evaluated the patient.  I personally confirmed the key portions of the history and exam documented by Dr. Nedrud and I reviewed pertinent patient test results.  The assessment, diagnosis, and plan were formulated together and I agree with the documentation in the resident's note.  

## 2017-04-08 NOTE — Assessment & Plan Note (Signed)
BP elevated on first reading, within slightly elevated above goal of <140/90 on second reading. Since patient has difficulty taking current medications and has been normotensive on previous visits, will defer adding therapy at this time.  Plan: -Follow up at next visit with PCP in 1-2 months

## 2017-04-08 NOTE — Assessment & Plan Note (Addendum)
Patient confused today about need for sertraline and has not taken this since it fell of medicine list after leaving hospital. It appears that this medication was cancelled by pharmacy tech on admission medication reconciliation (likely because patient reported not taking it regularly 2/2 confusion regarding medicaiton). Patient's pill bottle today filled with all medications. Patient and caregiver would like to decrease amount of medications taken daily and simplify regimen. Discussed pros and cons of stopping this therapy. Elected to stop therapy at this time for simplification of daily regimen. Please readdress symptoms at follow up.  Plan: -Continue holding of sertraline 50 mg qhs and monitor daily symptoms -Follow up with PCP at next visit to reassess mood symptoms

## 2017-04-08 NOTE — Assessment & Plan Note (Signed)
Patient currently well controlled on Eliquis 5mg  BID and metoprolol 12.5mg  BID. Patient denies palpitations, dizziness, or difficulty with this medication. Some concern for compliance given baseline dementia and lack of daily observation when taking medication in home. Given refills for medication today, discussed use of pill box. Patient's caregiver agreeable to setting up pill box each week and watching patient take medications daily.  Given baseline dementia and confusion with medication, discussed referral to [facility] with patient's caregiver.They plan to discuss as family, but patient doesn't think this would be a good fit for her because she is a "homebody" and doesn't like to leave her house. Discussed possibility of needing additional help with daily care given baseline and how this program may be a good option of patient experiences cognitive decline in next few months.    Plan: -Continue Eliquis 5mg  BID and metoprolol 12.5mg  BID, refills provided -Discuss PACE referral at next visit.

## 2017-04-08 NOTE — Patient Instructions (Addendum)
FOLLOW-UP INSTRUCTIONS When: 1-2 months For: Follow up of chronic medical conditions What to bring: Prescription medications  Thank you for seeing us in the clinic today!  You were given refills for your medications today. Please return to the clinic in 1-2 for follow up of your chronic medical conditions with your primary care physician.  If you have any questions or concerns, please call our clinic at [phone] between the hours of 9am-5pm. If you have a problem after these hours, please call [phone] and ask for the internal medicine resident on call. If you feel you are having a medical emergency please call 911.   Thanks, Dr. Jeanella FlatteryMarybeth Julee Stoll

## 2017-04-09 DIAGNOSIS — K529 Noninfective gastroenteritis and colitis, unspecified: Secondary | ICD-10-CM | POA: Diagnosis not present

## 2017-04-10 DIAGNOSIS — K529 Noninfective gastroenteritis and colitis, unspecified: Secondary | ICD-10-CM | POA: Diagnosis not present

## 2017-04-11 DIAGNOSIS — K529 Noninfective gastroenteritis and colitis, unspecified: Secondary | ICD-10-CM | POA: Diagnosis not present

## 2017-04-12 DIAGNOSIS — K529 Noninfective gastroenteritis and colitis, unspecified: Secondary | ICD-10-CM | POA: Diagnosis not present

## 2017-04-13 DIAGNOSIS — K529 Noninfective gastroenteritis and colitis, unspecified: Secondary | ICD-10-CM | POA: Diagnosis not present

## 2017-04-14 ENCOUNTER — Other Ambulatory Visit: Payer: Self-pay | Admitting: *Deleted

## 2017-04-14 DIAGNOSIS — I480 Paroxysmal atrial fibrillation: Secondary | ICD-10-CM | POA: Diagnosis not present

## 2017-04-14 DIAGNOSIS — R32 Unspecified urinary incontinence: Secondary | ICD-10-CM | POA: Diagnosis not present

## 2017-04-14 DIAGNOSIS — R260 Ataxic gait: Secondary | ICD-10-CM | POA: Diagnosis not present

## 2017-04-14 DIAGNOSIS — R2681 Unsteadiness on feet: Secondary | ICD-10-CM | POA: Diagnosis not present

## 2017-04-14 DIAGNOSIS — M818 Other osteoporosis without current pathological fracture: Secondary | ICD-10-CM | POA: Diagnosis not present

## 2017-04-14 DIAGNOSIS — H109 Unspecified conjunctivitis: Secondary | ICD-10-CM | POA: Diagnosis not present

## 2017-04-14 DIAGNOSIS — R269 Unspecified abnormalities of gait and mobility: Secondary | ICD-10-CM | POA: Diagnosis not present

## 2017-04-14 NOTE — Patient Outreach (Signed)
[facility]) Care Management  04/14/2017  Lisa Haynes 1940/10/08 [id number]   Call placed to member to follow up on MD visit last week, no answer.  HIPAA compliant voice message left.  Will await call back, if no call back will follow up within the next 2 weeks.  Kemper DurieMonica Enrique Weiss, MSN, RN [facility] Care Management  [facility] Care [facility] [phone]

## 2017-04-15 DIAGNOSIS — I480 Paroxysmal atrial fibrillation: Secondary | ICD-10-CM | POA: Diagnosis not present

## 2017-04-16 DIAGNOSIS — I480 Paroxysmal atrial fibrillation: Secondary | ICD-10-CM | POA: Diagnosis not present

## 2017-04-17 DIAGNOSIS — I480 Paroxysmal atrial fibrillation: Secondary | ICD-10-CM | POA: Diagnosis not present

## 2017-04-18 DIAGNOSIS — I480 Paroxysmal atrial fibrillation: Secondary | ICD-10-CM | POA: Diagnosis not present

## 2017-04-19 ENCOUNTER — Encounter: Payer: Self-pay | Admitting: Pharmacist

## 2017-04-19 ENCOUNTER — Other Ambulatory Visit: Payer: Self-pay | Admitting: *Deleted

## 2017-04-19 DIAGNOSIS — I480 Paroxysmal atrial fibrillation: Secondary | ICD-10-CM | POA: Diagnosis not present

## 2017-04-19 NOTE — Patient Outreach (Signed)
[facility]) Care Management  04/19/2017  Lisa Haynes [birth year] [id number]   Call placed to member to follow up on current health status, no answer.  HIPAA compliant voice message left, will await call back.  If no call back, will follow up within the next week.  Kemper DurieMonica Noga Fogg, [place]RN, MSN [facility] Care Management  [facility] Care [facility] [phone]

## 2017-04-20 DIAGNOSIS — I480 Paroxysmal atrial fibrillation: Secondary | ICD-10-CM | POA: Diagnosis not present

## 2017-04-21 DIAGNOSIS — I480 Paroxysmal atrial fibrillation: Secondary | ICD-10-CM | POA: Diagnosis not present

## 2017-04-22 ENCOUNTER — Other Ambulatory Visit: Payer: Self-pay | Admitting: *Deleted

## 2017-04-22 ENCOUNTER — Encounter: Payer: Self-pay | Admitting: *Deleted

## 2017-04-22 DIAGNOSIS — I480 Paroxysmal atrial fibrillation: Secondary | ICD-10-CM | POA: Diagnosis not present

## 2017-04-22 NOTE — Patient Outreach (Signed)
[facility]) Care Management  04/22/2017  Lisa Haynes 09/05/1940 [id number]   Call placed to follow up on current health condition, no answer.  HIPAA compliant voice message left.  Will await call back.  This makes 3rd unsuccessful outreach attempt.  Unsuccessful outreach letter sent, if no response in 10 days, will close case.  Kemper DurieMonica Willine Schwalbe, BSN, [facility] Care Management  [facility] Care Manager [phone]

## 2017-04-23 DIAGNOSIS — I480 Paroxysmal atrial fibrillation: Secondary | ICD-10-CM | POA: Diagnosis not present

## 2017-04-24 DIAGNOSIS — I480 Paroxysmal atrial fibrillation: Secondary | ICD-10-CM | POA: Diagnosis not present

## 2017-04-25 ENCOUNTER — Ambulatory Visit: Payer: Self-pay | Admitting: *Deleted

## 2017-04-25 DIAGNOSIS — I480 Paroxysmal atrial fibrillation: Secondary | ICD-10-CM | POA: Diagnosis not present

## 2017-04-26 DIAGNOSIS — I480 Paroxysmal atrial fibrillation: Secondary | ICD-10-CM | POA: Diagnosis not present

## 2017-04-27 DIAGNOSIS — I480 Paroxysmal atrial fibrillation: Secondary | ICD-10-CM | POA: Diagnosis not present

## 2017-04-28 DIAGNOSIS — I480 Paroxysmal atrial fibrillation: Secondary | ICD-10-CM | POA: Diagnosis not present

## 2017-04-29 DIAGNOSIS — I480 Paroxysmal atrial fibrillation: Secondary | ICD-10-CM | POA: Diagnosis not present

## 2017-04-30 DIAGNOSIS — I480 Paroxysmal atrial fibrillation: Secondary | ICD-10-CM | POA: Diagnosis not present

## 2017-05-01 DIAGNOSIS — I480 Paroxysmal atrial fibrillation: Secondary | ICD-10-CM | POA: Diagnosis not present

## 2017-05-02 DIAGNOSIS — I480 Paroxysmal atrial fibrillation: Secondary | ICD-10-CM | POA: Diagnosis not present

## 2017-05-03 DIAGNOSIS — K529 Noninfective gastroenteritis and colitis, unspecified: Secondary | ICD-10-CM | POA: Diagnosis not present

## 2017-05-04 DIAGNOSIS — K529 Noninfective gastroenteritis and colitis, unspecified: Secondary | ICD-10-CM | POA: Diagnosis not present

## 2017-05-05 DIAGNOSIS — K529 Noninfective gastroenteritis and colitis, unspecified: Secondary | ICD-10-CM | POA: Diagnosis not present

## 2017-05-06 ENCOUNTER — Other Ambulatory Visit: Payer: Self-pay | Admitting: Internal Medicine

## 2017-05-06 DIAGNOSIS — K529 Noninfective gastroenteritis and colitis, unspecified: Secondary | ICD-10-CM | POA: Diagnosis not present

## 2017-05-07 DIAGNOSIS — K529 Noninfective gastroenteritis and colitis, unspecified: Secondary | ICD-10-CM | POA: Diagnosis not present

## 2017-05-08 DIAGNOSIS — K529 Noninfective gastroenteritis and colitis, unspecified: Secondary | ICD-10-CM | POA: Diagnosis not present

## 2017-05-09 ENCOUNTER — Encounter: Payer: Self-pay | Admitting: *Deleted

## 2017-05-09 ENCOUNTER — Other Ambulatory Visit: Payer: Self-pay | Admitting: *Deleted

## 2017-05-09 DIAGNOSIS — K529 Noninfective gastroenteritis and colitis, unspecified: Secondary | ICD-10-CM | POA: Diagnosis not present

## 2017-05-09 NOTE — Patient Outreach (Signed)
[facility]) Care Management  05/09/2017  Lisa Haynes 12-05-1940 [id number]   Unsuccessful outreach letter sent on 12/21, no response.  Will close case at this time.  Will notify care management assistant and primary MD of case closure.  Kemper DurieMonica Angelie Kram, [place]RN, MSN [facility] Care Management  [facility] Care Manager [phone]

## 2017-05-10 ENCOUNTER — Other Ambulatory Visit: Payer: Self-pay | Admitting: Internal Medicine

## 2017-05-10 ENCOUNTER — Encounter: Payer: Medicare HMO | Admitting: Internal Medicine

## 2017-05-10 DIAGNOSIS — K529 Noninfective gastroenteritis and colitis, unspecified: Secondary | ICD-10-CM | POA: Diagnosis not present

## 2017-05-11 DIAGNOSIS — K529 Noninfective gastroenteritis and colitis, unspecified: Secondary | ICD-10-CM | POA: Diagnosis not present

## 2017-05-12 DIAGNOSIS — K529 Noninfective gastroenteritis and colitis, unspecified: Secondary | ICD-10-CM | POA: Diagnosis not present

## 2017-05-13 DIAGNOSIS — K529 Noninfective gastroenteritis and colitis, unspecified: Secondary | ICD-10-CM | POA: Diagnosis not present

## 2017-05-14 DIAGNOSIS — K529 Noninfective gastroenteritis and colitis, unspecified: Secondary | ICD-10-CM | POA: Diagnosis not present

## 2017-05-15 DIAGNOSIS — R2681 Unsteadiness on feet: Secondary | ICD-10-CM | POA: Diagnosis not present

## 2017-05-15 DIAGNOSIS — R260 Ataxic gait: Secondary | ICD-10-CM | POA: Diagnosis not present

## 2017-05-15 DIAGNOSIS — H109 Unspecified conjunctivitis: Secondary | ICD-10-CM | POA: Diagnosis not present

## 2017-05-15 DIAGNOSIS — R32 Unspecified urinary incontinence: Secondary | ICD-10-CM | POA: Diagnosis not present

## 2017-05-15 DIAGNOSIS — K529 Noninfective gastroenteritis and colitis, unspecified: Secondary | ICD-10-CM | POA: Diagnosis not present

## 2017-05-15 DIAGNOSIS — R269 Unspecified abnormalities of gait and mobility: Secondary | ICD-10-CM | POA: Diagnosis not present

## 2017-05-15 DIAGNOSIS — M818 Other osteoporosis without current pathological fracture: Secondary | ICD-10-CM | POA: Diagnosis not present

## 2017-05-16 DIAGNOSIS — K529 Noninfective gastroenteritis and colitis, unspecified: Secondary | ICD-10-CM | POA: Diagnosis not present

## 2017-05-17 DIAGNOSIS — K529 Noninfective gastroenteritis and colitis, unspecified: Secondary | ICD-10-CM | POA: Diagnosis not present

## 2017-05-18 DIAGNOSIS — I480 Paroxysmal atrial fibrillation: Secondary | ICD-10-CM | POA: Diagnosis not present

## 2017-05-19 DIAGNOSIS — I480 Paroxysmal atrial fibrillation: Secondary | ICD-10-CM | POA: Diagnosis not present

## 2017-05-20 DIAGNOSIS — I480 Paroxysmal atrial fibrillation: Secondary | ICD-10-CM | POA: Diagnosis not present

## 2017-05-21 DIAGNOSIS — I480 Paroxysmal atrial fibrillation: Secondary | ICD-10-CM | POA: Diagnosis not present

## 2017-05-22 DIAGNOSIS — I480 Paroxysmal atrial fibrillation: Secondary | ICD-10-CM | POA: Diagnosis not present

## 2017-05-23 DIAGNOSIS — R269 Unspecified abnormalities of gait and mobility: Secondary | ICD-10-CM | POA: Diagnosis not present

## 2017-05-23 DIAGNOSIS — R260 Ataxic gait: Secondary | ICD-10-CM | POA: Diagnosis not present

## 2017-05-23 DIAGNOSIS — I480 Paroxysmal atrial fibrillation: Secondary | ICD-10-CM | POA: Diagnosis not present

## 2017-05-23 DIAGNOSIS — R32 Unspecified urinary incontinence: Secondary | ICD-10-CM | POA: Diagnosis not present

## 2017-05-24 DIAGNOSIS — I480 Paroxysmal atrial fibrillation: Secondary | ICD-10-CM | POA: Diagnosis not present

## 2017-05-25 DIAGNOSIS — I480 Paroxysmal atrial fibrillation: Secondary | ICD-10-CM | POA: Diagnosis not present

## 2017-05-26 DIAGNOSIS — I480 Paroxysmal atrial fibrillation: Secondary | ICD-10-CM | POA: Diagnosis not present

## 2017-05-27 DIAGNOSIS — I480 Paroxysmal atrial fibrillation: Secondary | ICD-10-CM | POA: Diagnosis not present

## 2017-05-28 ENCOUNTER — Telehealth: Payer: Self-pay | Admitting: Internal Medicine

## 2017-05-28 DIAGNOSIS — I480 Paroxysmal atrial fibrillation: Secondary | ICD-10-CM | POA: Diagnosis not present

## 2017-05-28 NOTE — Telephone Encounter (Signed)
   Reason for call:   I received a call from Lisa Elseina Corona, [name] 's sons fiance at 1:30 PM indicating that patient is refusing to take her medications for the past 3 days, hallucinations are getting worse.    Pertinent Data:   I know Lisa Haynes well, I have been concerned about these hallucinations and her difficult to control depression and [facility] but [facility] was not scheduled when she broke her hip and had changes in caregivers. She has very labile Afib but this is well controlled when she takes lopressor.   Lisa Haynes has expressed to family that she would like to be with her parents ( who are deceased ), she has not shown any homicidal or suicidal gestures  Family is aware that she needs 24 hours daily care and feel that they are not able to provide that for her at this time.   I called and spoke with Lisa Haynes, she is home alone at this time, she sounds confused consistent with baseline today, she is not having hallucinations at that this time    Patient has apt scheduled with myself in continuity clinic on Tuesday, she agreed to take her medications until that visit.    Assessment / Plan / Recommendations:   Asked sons girlfriend to try and convince her to take eliquis, lopressor, and sertraline.  Encouraged Lisa Haynes to take these necessary medications to avoid hospitalization for afib. Attempted to call and speak with her son [name] but his cell phone is ringing off the hook.   Explained that they should call back and may need to come to the emergency department if they are not able to convince her to take the metoprolol and eliquis until tuesdays apt.   As always, pt is advised that if symptoms worsen or new symptoms arise, they should go to an urgent care facility or to to ER for further evaluation.   [name], [name], MD   05/28/2017, 1:32 PM

## 2017-05-29 DIAGNOSIS — I480 Paroxysmal atrial fibrillation: Secondary | ICD-10-CM | POA: Diagnosis not present

## 2017-05-30 DIAGNOSIS — I480 Paroxysmal atrial fibrillation: Secondary | ICD-10-CM | POA: Diagnosis not present

## 2017-05-31 ENCOUNTER — Encounter: Payer: Medicare HMO | Admitting: Internal Medicine

## 2017-05-31 DIAGNOSIS — I480 Paroxysmal atrial fibrillation: Secondary | ICD-10-CM | POA: Diagnosis not present

## 2017-06-01 ENCOUNTER — Other Ambulatory Visit: Payer: Self-pay | Admitting: Internal Medicine

## 2017-06-01 DIAGNOSIS — I480 Paroxysmal atrial fibrillation: Secondary | ICD-10-CM | POA: Diagnosis not present

## 2017-06-03 DIAGNOSIS — I480 Paroxysmal atrial fibrillation: Secondary | ICD-10-CM | POA: Diagnosis not present

## 2017-06-04 DIAGNOSIS — I480 Paroxysmal atrial fibrillation: Secondary | ICD-10-CM | POA: Diagnosis not present

## 2017-06-05 DIAGNOSIS — I480 Paroxysmal atrial fibrillation: Secondary | ICD-10-CM | POA: Diagnosis not present

## 2017-06-05 NOTE — Progress Notes (Deleted)
  Flu   Lisa Haynes is a 77 y.o. PMH paroxysmal atrial fibrillation/ atrial flutter, anxiety, depression, hypertension, osteoporosis   Hallucinations  - Refer to geripsych   Hypertension  BP Readings from Last 3 Encounters:  04/08/17 (!) 145/64  03/03/17 116/78  02/04/17 124/78  - *** metoprolol tartrate 12.5 BID   Osteoporosis  - fosamax 70 mg qd   Paroxysmal atrial fibrillation  - metoprolol tartrate 12.5 mg BID  - eliquis 5 mg qd   Depression  - sertraline 50 mg qd

## 2017-06-06 ENCOUNTER — Encounter: Payer: Medicare HMO | Admitting: Internal Medicine

## 2017-06-06 DIAGNOSIS — I480 Paroxysmal atrial fibrillation: Secondary | ICD-10-CM | POA: Diagnosis not present

## 2017-06-07 DIAGNOSIS — I480 Paroxysmal atrial fibrillation: Secondary | ICD-10-CM | POA: Diagnosis not present

## 2017-06-08 DIAGNOSIS — I480 Paroxysmal atrial fibrillation: Secondary | ICD-10-CM | POA: Diagnosis not present

## 2017-06-09 DIAGNOSIS — I480 Paroxysmal atrial fibrillation: Secondary | ICD-10-CM | POA: Diagnosis not present

## 2017-06-10 DIAGNOSIS — I480 Paroxysmal atrial fibrillation: Secondary | ICD-10-CM | POA: Diagnosis not present

## 2017-06-11 DIAGNOSIS — I480 Paroxysmal atrial fibrillation: Secondary | ICD-10-CM | POA: Diagnosis not present

## 2017-06-12 DIAGNOSIS — I480 Paroxysmal atrial fibrillation: Secondary | ICD-10-CM | POA: Diagnosis not present

## 2017-06-13 ENCOUNTER — Encounter: Payer: Self-pay | Admitting: Internal Medicine

## 2017-06-13 ENCOUNTER — Encounter: Payer: Medicare HMO | Admitting: Internal Medicine

## 2017-06-13 DIAGNOSIS — I480 Paroxysmal atrial fibrillation: Secondary | ICD-10-CM | POA: Diagnosis not present

## 2017-06-14 DIAGNOSIS — I480 Paroxysmal atrial fibrillation: Secondary | ICD-10-CM | POA: Diagnosis not present

## 2017-06-15 DIAGNOSIS — R260 Ataxic gait: Secondary | ICD-10-CM | POA: Diagnosis not present

## 2017-06-15 DIAGNOSIS — R2681 Unsteadiness on feet: Secondary | ICD-10-CM | POA: Diagnosis not present

## 2017-06-15 DIAGNOSIS — I480 Paroxysmal atrial fibrillation: Secondary | ICD-10-CM | POA: Diagnosis not present

## 2017-06-15 DIAGNOSIS — R32 Unspecified urinary incontinence: Secondary | ICD-10-CM | POA: Diagnosis not present

## 2017-06-15 DIAGNOSIS — M818 Other osteoporosis without current pathological fracture: Secondary | ICD-10-CM | POA: Diagnosis not present

## 2017-06-15 DIAGNOSIS — H109 Unspecified conjunctivitis: Secondary | ICD-10-CM | POA: Diagnosis not present

## 2017-06-15 DIAGNOSIS — R269 Unspecified abnormalities of gait and mobility: Secondary | ICD-10-CM | POA: Diagnosis not present

## 2017-06-16 ENCOUNTER — Telehealth: Payer: Self-pay | Admitting: Internal Medicine

## 2017-06-16 DIAGNOSIS — S32599D Other specified fracture of unspecified pubis, subsequent encounter for fracture with routine healing: Secondary | ICD-10-CM

## 2017-06-16 DIAGNOSIS — I480 Paroxysmal atrial fibrillation: Secondary | ICD-10-CM | POA: Diagnosis not present

## 2017-06-16 NOTE — Telephone Encounter (Signed)
Pt requesting a New Rollator walker as her old one has fallen apart and she needs a new one.

## 2017-06-17 DIAGNOSIS — I480 Paroxysmal atrial fibrillation: Secondary | ICD-10-CM | POA: Diagnosis not present

## 2017-06-18 NOTE — Telephone Encounter (Signed)
Thank you. I have placed a DME order for the Rollator. Forwarding to front desk pool so that we get her continuity clinic visit rescheduled.

## 2017-06-21 ENCOUNTER — Telehealth: Payer: Self-pay | Admitting: Internal Medicine

## 2017-06-21 NOTE — Telephone Encounter (Signed)
The patient DME REQUEST for a ROLLATOR WALKER with SEAT has been faxed to [facility] @ [phone].  Please have the patient to contact their office directly @ [phone] if any questions regarding payment, delivery and time.

## 2017-06-22 DIAGNOSIS — R269 Unspecified abnormalities of gait and mobility: Secondary | ICD-10-CM | POA: Diagnosis not present

## 2017-06-22 DIAGNOSIS — R260 Ataxic gait: Secondary | ICD-10-CM | POA: Diagnosis not present

## 2017-06-22 DIAGNOSIS — R32 Unspecified urinary incontinence: Secondary | ICD-10-CM | POA: Diagnosis not present

## 2017-06-27 DIAGNOSIS — R260 Ataxic gait: Secondary | ICD-10-CM | POA: Diagnosis not present

## 2017-06-27 DIAGNOSIS — R2689 Other abnormalities of gait and mobility: Secondary | ICD-10-CM | POA: Diagnosis not present

## 2017-06-27 DIAGNOSIS — R32 Unspecified urinary incontinence: Secondary | ICD-10-CM | POA: Diagnosis not present

## 2017-06-27 DIAGNOSIS — H109 Unspecified conjunctivitis: Secondary | ICD-10-CM | POA: Diagnosis not present

## 2017-06-27 DIAGNOSIS — M818 Other osteoporosis without current pathological fracture: Secondary | ICD-10-CM | POA: Diagnosis not present

## 2017-06-27 DIAGNOSIS — M6281 Muscle weakness (generalized): Secondary | ICD-10-CM | POA: Diagnosis not present

## 2017-06-27 DIAGNOSIS — R2681 Unsteadiness on feet: Secondary | ICD-10-CM | POA: Diagnosis not present

## 2017-06-27 DIAGNOSIS — S32599D Other specified fracture of unspecified pubis, subsequent encounter for fracture with routine healing: Secondary | ICD-10-CM | POA: Diagnosis not present

## 2017-06-27 DIAGNOSIS — R269 Unspecified abnormalities of gait and mobility: Secondary | ICD-10-CM | POA: Diagnosis not present

## 2017-06-30 ENCOUNTER — Encounter (HOSPITAL_COMMUNITY): Payer: Self-pay | Admitting: Radiology

## 2017-06-30 ENCOUNTER — Emergency Department (HOSPITAL_COMMUNITY): Payer: Medicare HMO

## 2017-06-30 ENCOUNTER — Inpatient Hospital Stay (HOSPITAL_COMMUNITY)
Admission: EM | Admit: 2017-06-30 | Discharge: 2017-07-05 | DRG: 689 | Disposition: A | Discharge status: Skilled Nursing Facility | Payer: Medicare HMO | Attending: Internal Medicine | Admitting: Internal Medicine

## 2017-06-30 DIAGNOSIS — I482 Chronic atrial fibrillation, unspecified: Secondary | ICD-10-CM

## 2017-06-30 DIAGNOSIS — F418 Other specified anxiety disorders: Secondary | ICD-10-CM | POA: Diagnosis present

## 2017-06-30 DIAGNOSIS — Z885 Allergy status to narcotic agent status: Secondary | ICD-10-CM | POA: Diagnosis not present

## 2017-06-30 DIAGNOSIS — F0390 Unspecified dementia without behavioral disturbance: Secondary | ICD-10-CM | POA: Diagnosis not present

## 2017-06-30 DIAGNOSIS — I4891 Unspecified atrial fibrillation: Secondary | ICD-10-CM | POA: Diagnosis not present

## 2017-06-30 DIAGNOSIS — N179 Acute kidney failure, unspecified: Secondary | ICD-10-CM

## 2017-06-30 DIAGNOSIS — I10 Essential (primary) hypertension: Secondary | ICD-10-CM | POA: Diagnosis present

## 2017-06-30 DIAGNOSIS — G9341 Metabolic encephalopathy: Secondary | ICD-10-CM | POA: Diagnosis not present

## 2017-06-30 DIAGNOSIS — R41841 Cognitive communication deficit: Secondary | ICD-10-CM | POA: Diagnosis not present

## 2017-06-30 DIAGNOSIS — R4182 Altered mental status, unspecified: Secondary | ICD-10-CM

## 2017-06-30 DIAGNOSIS — I480 Paroxysmal atrial fibrillation: Secondary | ICD-10-CM | POA: Diagnosis not present

## 2017-06-30 DIAGNOSIS — G9340 Encephalopathy, unspecified: Secondary | ICD-10-CM | POA: Diagnosis not present

## 2017-06-30 DIAGNOSIS — R05 Cough: Secondary | ICD-10-CM | POA: Diagnosis not present

## 2017-06-30 DIAGNOSIS — F419 Anxiety disorder, unspecified: Secondary | ICD-10-CM | POA: Diagnosis not present

## 2017-06-30 DIAGNOSIS — M6281 Muscle weakness (generalized): Secondary | ICD-10-CM | POA: Diagnosis not present

## 2017-06-30 DIAGNOSIS — G309 Alzheimer's disease, unspecified: Secondary | ICD-10-CM

## 2017-06-30 DIAGNOSIS — R7989 Other specified abnormal findings of blood chemistry: Secondary | ICD-10-CM | POA: Diagnosis not present

## 2017-06-30 DIAGNOSIS — R0989 Other specified symptoms and signs involving the circulatory and respiratory systems: Secondary | ICD-10-CM | POA: Diagnosis not present

## 2017-06-30 DIAGNOSIS — E7800 Pure hypercholesterolemia, unspecified: Secondary | ICD-10-CM | POA: Diagnosis present

## 2017-06-30 DIAGNOSIS — E785 Hyperlipidemia, unspecified: Secondary | ICD-10-CM | POA: Diagnosis present

## 2017-06-30 DIAGNOSIS — N3000 Acute cystitis without hematuria: Principal | ICD-10-CM | POA: Diagnosis present

## 2017-06-30 DIAGNOSIS — F0280 Dementia in other diseases classified elsewhere without behavioral disturbance: Secondary | ICD-10-CM | POA: Diagnosis not present

## 2017-06-30 DIAGNOSIS — B9620 Unspecified Escherichia coli [E. coli] as the cause of diseases classified elsewhere: Secondary | ICD-10-CM | POA: Diagnosis not present

## 2017-06-30 DIAGNOSIS — R278 Other lack of coordination: Secondary | ICD-10-CM | POA: Diagnosis not present

## 2017-06-30 DIAGNOSIS — N390 Urinary tract infection, site not specified: Secondary | ICD-10-CM

## 2017-06-30 DIAGNOSIS — F05 Delirium due to known physiological condition: Secondary | ICD-10-CM | POA: Diagnosis not present

## 2017-06-30 DIAGNOSIS — Z79899 Other long term (current) drug therapy: Secondary | ICD-10-CM | POA: Diagnosis not present

## 2017-06-30 DIAGNOSIS — R2689 Other abnormalities of gait and mobility: Secondary | ICD-10-CM | POA: Diagnosis not present

## 2017-06-30 DIAGNOSIS — E860 Dehydration: Secondary | ICD-10-CM | POA: Diagnosis not present

## 2017-06-30 DIAGNOSIS — Z7901 Long term (current) use of anticoagulants: Secondary | ICD-10-CM | POA: Diagnosis not present

## 2017-06-30 DIAGNOSIS — F339 Major depressive disorder, recurrent, unspecified: Secondary | ICD-10-CM | POA: Diagnosis not present

## 2017-06-30 DIAGNOSIS — G301 Alzheimer's disease with late onset: Secondary | ICD-10-CM | POA: Diagnosis present

## 2017-06-30 DIAGNOSIS — G9349 Other encephalopathy: Secondary | ICD-10-CM | POA: Diagnosis not present

## 2017-06-30 DIAGNOSIS — F332 Major depressive disorder, recurrent severe without psychotic features: Secondary | ICD-10-CM | POA: Diagnosis not present

## 2017-06-30 LAB — URINALYSIS, ROUTINE W REFLEX MICROSCOPIC
BILIRUBIN URINE: NEGATIVE
GLUCOSE, UA: NEGATIVE mg/dL
Ketones, ur: 20 mg/dL — AB
NITRITE: POSITIVE — AB
PH: 5 (ref 5.0–8.0)
Protein, ur: 30 mg/dL — AB
SPECIFIC GRAVITY, URINE: 1.023 (ref 1.005–1.030)

## 2017-06-30 LAB — CBC
HEMATOCRIT: 42 % (ref 36.0–46.0)
HEMOGLOBIN: 13.5 g/dL (ref 12.0–15.0)
MCH: 30.1 pg (ref 26.0–34.0)
MCHC: 32.1 g/dL (ref 30.0–36.0)
MCV: 93.5 fL (ref 78.0–100.0)
Platelets: 218 10*3/uL (ref 150–400)
RBC: 4.49 MIL/uL (ref 3.87–5.11)
RDW: 13.6 % (ref 11.5–15.5)
WBC: 7.3 10*3/uL (ref 4.0–10.5)

## 2017-06-30 LAB — COMPREHENSIVE METABOLIC PANEL
ALT: 16 U/L (ref 14–54)
ANION GAP: 14 (ref 5–15)
AST: 37 U/L (ref 15–41)
Albumin: 3.3 g/dL — ABNORMAL LOW (ref 3.5–5.0)
Alkaline Phosphatase: 52 U/L (ref 38–126)
BILIRUBIN TOTAL: 1.1 mg/dL (ref 0.3–1.2)
BUN: 14 mg/dL (ref 6–20)
CO2: 18 mmol/L — AB (ref 22–32)
Calcium: 8.7 mg/dL — ABNORMAL LOW (ref 8.9–10.3)
Chloride: 104 mmol/L (ref 101–111)
Creatinine, Ser: 1.48 mg/dL — ABNORMAL HIGH (ref 0.44–1.00)
GFR calc Af Amer: 38 mL/min — ABNORMAL LOW (ref >60)
GFR calc non Af Amer: 33 mL/min — ABNORMAL LOW (ref >60)
Glucose, Bld: 84 mg/dL (ref 65–99)
POTASSIUM: 3.7 mmol/L (ref 3.5–5.1)
SODIUM: 136 mmol/L (ref 135–145)
TOTAL PROTEIN: 7.1 g/dL (ref 6.5–8.1)

## 2017-06-30 LAB — TROPONIN I: Troponin I: 0.04 ng/mL (ref <0.03)

## 2017-06-30 LAB — CBG MONITORING, ED: Glucose-Capillary: 95 mg/dL (ref 65–99)

## 2017-06-30 LAB — I-STAT CG4 LACTIC ACID, ED: Lactic Acid, Venous: 0.97 mmol/L (ref 0.5–1.9)

## 2017-06-30 MED ORDER — SENNOSIDES-DOCUSATE SODIUM 8.6-50 MG PO TABS: 1.0000 | ORAL_TABLET | Freq: Every evening | ORAL | Status: DC | PRN

## 2017-06-30 MED ORDER — SIMVASTATIN 20 MG PO TABS
10.0000 mg | ORAL_TABLET | Freq: Every day | ORAL | Status: DC
  Administered 2017-07-01 – 2017-07-05 (×5): 10 mg via ORAL
  Filled 2017-06-30 (×7): qty 1

## 2017-06-30 MED ORDER — SODIUM CHLORIDE 0.9 % IV BOLUS (SEPSIS): 1000.0000 mL | Freq: Once | INTRAVENOUS | Status: DC

## 2017-06-30 MED ORDER — METOPROLOL TARTRATE 25 MG PO TABS
12.5000 mg | ORAL_TABLET | Freq: Two times a day (BID) | ORAL | Status: DC
  Filled 2017-06-30: qty 1

## 2017-06-30 MED ORDER — APIXABAN 5 MG PO TABS
5.0000 mg | ORAL_TABLET | Freq: Two times a day (BID) | ORAL | Status: DC
  Administered 2017-07-01 – 2017-07-05 (×9): 5 mg via ORAL
  Filled 2017-06-30 (×9): qty 1

## 2017-06-30 MED ORDER — SODIUM CHLORIDE 0.9 % IV SOLN
1.0000 g | INTRAVENOUS | Status: DC
  Administered 2017-07-01 – 2017-07-03 (×3): 1 g via INTRAVENOUS
  Filled 2017-06-30 (×3): qty 10

## 2017-06-30 MED ORDER — SODIUM CHLORIDE 0.9 % IV SOLN
1.0000 g | Freq: Once | INTRAVENOUS | Status: AC
  Administered 2017-06-30: 1 g via INTRAVENOUS
  Filled 2017-06-30: qty 10

## 2017-06-30 MED ORDER — ACETAMINOPHEN 650 MG RE SUPP: 650.0000 mg | Freq: Four times a day (QID) | RECTAL | Status: DC | PRN

## 2017-06-30 MED ORDER — ACETAMINOPHEN 325 MG PO TABS
650.0000 mg | ORAL_TABLET | Freq: Four times a day (QID) | ORAL | Status: DC | PRN
  Administered 2017-07-01: 650 mg via ORAL
  Filled 2017-06-30: qty 2

## 2017-06-30 MED ORDER — SERTRALINE HCL 50 MG PO TABS
50.0000 mg | ORAL_TABLET | Freq: Every day | ORAL | Status: DC
  Administered 2017-07-01 – 2017-07-04 (×4): 50 mg via ORAL
  Filled 2017-06-30 (×4): qty 1

## 2017-06-30 MED ORDER — SODIUM CHLORIDE 0.9 % IV BOLUS (SEPSIS)
500.0000 mL | Freq: Once | INTRAVENOUS | Status: AC
  Administered 2017-06-30: 500 mL via INTRAVENOUS

## 2017-06-30 NOTE — ED Provider Notes (Signed)
[facility] Provider Note   CSN: [id number] Arrival date & time: 06/30/17  1630     History   Chief Complaint Chief Complaint  Patient presents with  . Altered Mental Status    HPI Lucilla Edinatsy J Amey is a 77 y.o. female.  Pt presents to the ED today with altered mental status.  The pt's son told EMS that when he came back to the house from the store, the pt was not acting right.  She has been having n/v.  She has been urinating a lot.  Pt denies any nausea now.  She denies any pain.  Pt has a hx of dementia and is a poor historian and is unable to give much hx.  She is on Eliquis for a.fib.  No known falls.  CHA2DS2/VAS Stroke Risk Points      4 >= 2 Points: High Risk  1 - 1.99 Points: Medium Risk  0 Points: Low Risk    This is the only CHA2DS2/VAS Stroke Risk Points available for the past  year.:  Change: N/A     Details    This score determines the patient's risk of having a stroke if the  patient has atrial fibrillation.       Points Metrics  0 Has Congestive Heart Failure:  No   0 Has Vascular Disease:  No   1 Has Hypertension:  Yes   2 Age:  [age]   0 Has Diabetes:  No   0 Had Stroke:  No  Had TIA:  No  Had thromboembolism:  No   1 Female:  Yes                Past Medical History:  Diagnosis Date  . Depression   . GERD (gastroesophageal reflux disease)   . High cholesterol   . Hypertension   . Osteoarthritis    "legs" (11/11/2015)  . Osteoporosis   . Paroxysmal atrial fibrillation (HCC) 08/20/2014  . Urinary incontinence     Patient Active Problem List   Diagnosis Date Noted  . Cough 12/28/2016  . Black stool 12/28/2016  . Pressure injury of skin 11/20/2016  . Inferior pubic ramus fracture (HCC) 11/19/2016  . Hallucinations 09/12/2016  . Unsteady gait 05/29/2016  . Atrial flutter (HCC) 11/20/2015  . 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 Paroxysmal atrial fibrillation (HCC) 09/25/2015  . Diverticulosis  06/20/2014  . Preventative health care 10/15/2010  . Hyperlipidemia 01/29/2009  . GERD 04/15/2008  . Depression with anxiety 09/01/2007  . Essential hypertension 07/13/2006  . Osteoporosis 07/13/2006    Past Surgical History:  Procedure Laterality Date  . APPENDECTOMY    . CHOLECYSTECTOMY N/A 10/01/2014   Procedure: LAPAROSCOPIC CHOLECYSTECTOMY ;  Surgeon: Harriette Bouillonhomas Cornett, MD;  Location: [facility] OR;  Service: General;  Laterality: N/A;  . DILATION AND CURETTAGE OF UTERUS     "after I had my boys"  . ERCP N/A 09/27/2014   Procedure: ENDOSCOPIC RETROGRADE CHOLANGIOPANCREATOGRAPHY (ERCP);  Surgeon: Jeani HawkingPatrick Hung, MD;  [facility];  Service: Endoscopy;  Laterality: N/A;  . VAGINAL HYSTERECTOMY     with unilateral oophorectomy    OB History    No data available       Home Medications    Prior to Admission medications   Medication Sig Start Date End Date Taking? Authorizing Provider  alendronate (FOSAMAX) 70 MG tablet Take 1 tablet (70 mg total) by mouth every 7 (seven) days. Take with a full glass of water on an  empty stomach. 09/24/16  Yes Thomasene Lot, MD  apixaban (ELIQUIS) 5 MG TABS tablet Take 1 tablet (5 mg total) by mouth 2 (two) times daily. 04/08/17  Yes NedrudJeanella Flattery, MD  Calcium Carbonate-Vitamin D (CALCARB 600/D) 600-400 MG-UNIT tablet Take 1 tablet by mouth 2 (two) times daily with a meal. 04/08/17  Yes Nedrud, Marybeth, MD  cholestyramine (QUESTRAN) 4 g packet MIX AND TAKE 1 PACKET BY MOUTH DAILY AS NEEDED FOR DIARRHEA 04/08/17  Yes Nedrud, Jeanella Flattery, MD  lisinopril (PRINIVIL,ZESTRIL) 5 MG tablet Take 5 mg by mouth daily.   Yes [provider]  metoprolol tartrate (LOPRESSOR) 25 MG tablet Take 0.5 tablets (12.5 mg total) by mouth 2 (two) times daily. 04/08/17 07/07/17 Yes NedrudJeanella Flattery, MD  Multiple Vitamins-Minerals (MULTIVITAMIN WITH MINERALS) tablet Take 1 tablet by mouth daily.   Yes [provider]  sertraline (ZOLOFT) 50 MG tablet TAKE 1 TABLET  BY MOUTH AT BEDTIME Patient taking differently: TAKE 1 TABLET (50mg ) BY MOUTH AT BEDTIME 05/12/17  Yes Eulah Pont, MD  simvastatin (ZOCOR) 10 MG tablet Take 1 tablet (10 mg total) by mouth daily. 04/08/17  Yes Nedrud, Jeanella Flattery, MD  nitroGLYCERIN (NITROSTAT) 0.4 MG SL tablet DISSOLVE 1 TABLET UNDER THE TONGUE EVERY 5 MINUTES AS NEEDED FOR CHEST PAIN 12/14/16   Gust Rung, DO    Family History Family History  Problem Relation Age of Onset  . Heart disease Mother        onset 15s  . Heart disease Brother 58    Social History Social History   Tobacco Use  . Smoking status: Never Smoker  . Smokeless tobacco: Never Used  Substance Use Topics  . Alcohol use: No    Alcohol/week: 0.0 oz  . Drug use: No     Allergies   Morphine and related   Review of Systems Review of Systems  Gastrointestinal: Positive for diarrhea and nausea.  All other systems reviewed and are negative.    Physical Exam Updated Vital Signs BP 101/75   Pulse (!) 109   Temp 97.6 F (36.4 C) (Oral)   Resp 19   SpO2 95%   Physical Exam  Constitutional: She appears well-developed and well-nourished.  HENT:  Head: Normocephalic and atraumatic.  Right Ear: External ear normal.  Left Ear: External ear normal.  Nose: Nose normal.  Mouth/Throat: Oropharynx is clear and moist.  Eyes: Conjunctivae and EOM are normal. Pupils are equal, round, and reactive to light.  Neck: Normal range of motion. Neck supple.  Cardiovascular: Normal heart sounds and intact distal pulses. An irregularly irregular rhythm present.  Pulmonary/Chest: Effort normal and breath sounds normal.  Abdominal: Soft. Bowel sounds are normal.  Musculoskeletal: Normal range of motion.  Neurological: She is alert.  Oriented to person.  Moving all 4 extremities.  Skin: Skin is warm. Capillary refill takes less than 2 seconds.  Nursing note and vitals reviewed.    ED Treatments / Results  Labs (all labs ordered are listed, but only  abnormal results are displayed) Labs Reviewed  COMPREHENSIVE METABOLIC PANEL - Abnormal; Notable for the following components:      Result Value   CO2 18 (*)    Creatinine, Ser 1.48 (*)    Calcium 8.7 (*)    Albumin 3.3 (*)    GFR calc non Af Amer 33 (*)    GFR calc Af Amer 38 (*)    All other components within normal limits  TROPONIN I - Abnormal; Notable for the following components:  Troponin I 0.04 (*)    All other components within normal limits  URINALYSIS, ROUTINE W REFLEX MICROSCOPIC - Abnormal; Notable for the following components:   APPearance CLOUDY (*)    Hgb urine dipstick MODERATE (*)    Ketones, ur 20 (*)    Protein, ur 30 (*)    Nitrite POSITIVE (*)    Leukocytes, UA LARGE (*)    Bacteria, UA RARE (*)    Squamous Epithelial / LPF 0-5 (*)    All other components within normal limits  CULTURE, BLOOD (ROUTINE X 2)  CULTURE, BLOOD (ROUTINE X 2)  URINE CULTURE  CBC  CBG MONITORING, ED  I-STAT CG4 LACTIC ACID, ED    EKG  EKG Interpretation  Date/Time:  Thursday June 30 2017 16:45:17 EST Ventricular Rate:  83 PR Interval:    QRS Duration: 82 QT Interval:  376 QTC Calculation: 442 R Axis:   -9 Text Interpretation:  Sinus rhythm Low voltage, extremity and precordial leads Consider anterior infarct Confirmed by Jacalyn Lefevre [phone]) on 06/30/2017 4:49:36 PM       Radiology Dg Chest 2 View  Result Date: 06/30/2017 CLINICAL DATA:  Altered mental status EXAM: CHEST  2 VIEW COMPARISON:  03/03/2016 FINDINGS: No focal pulmonary infiltrate or effusion. Stable borderline cardiomegaly with aortic atherosclerosis. No pneumothorax. Mild wedging deformities of mid to lower thoracic vertebra as before. IMPRESSION: No active cardiopulmonary disease. Electronically Signed   By: Jasmine Pang M.D.   On: 06/30/2017 17:39   Ct Head Wo Contrast  Result Date: 06/30/2017 CLINICAL DATA:  Altered mental status. EXAM: CT HEAD WITHOUT CONTRAST TECHNIQUE: Contiguous axial  images were obtained from the base of the skull through the vertex without intravenous contrast. COMPARISON:  09/18/2016 FINDINGS: Brain: Ventricles and cisterns are within normal. There is mild age related atrophic change present. There is no mass, mass effect, shift of midline structures or acute hemorrhage. Minimal basal ganglia calcifications are present. Mild chronic ischemic microvascular disease. No evidence of acute infarction. Vascular: No hyperdense vessel or unexpected calcification. Skull: Normal. Negative for fracture or focal lesion. Sinuses/Orbits: Orbits are normal and symmetric. Paranasal sinuses are well developed and well aerated with subtle tiny air-fluid level over the right maxillary sinus. Other: None. IMPRESSION: No acute findings. Mild chronic ischemic microvascular disease and age related atrophic change. Electronically Signed   By: Elberta Fortis M.D.   On: 06/30/2017 17:11    Procedures Procedures (including critical care time)  Medications Ordered in ED Medications  cefTRIAXone (ROCEPHIN) 1 g in sodium chloride 0.9 % 100 mL IVPB (not administered)  sodium chloride 0.9 % bolus 1,000 mL (not administered)  sodium chloride 0.9 % bolus 500 mL (0 mLs Intravenous Stopped 06/30/17 2105)     Initial Impression / Assessment and Plan / ED Course  I have reviewed the triage vital signs and the nursing notes.  Pertinent labs & imaging results that were available during my care of the patient were reviewed by me and considered in my medical decision making (see chart for details).    There has been no family with patient to get any additional hx.  Pt's bp has been up and down.  It does respond to IVFs.  Pt's HR is also up and down.  I suspect she has not been able to take her meds as directed.  Pt d/w IM resident for admission to treat her UTI and to get her afib back in control.   Final Clinical Impressions(s) / ED Diagnoses  Final diagnoses:  Dehydration  Altered mental  status, unspecified altered mental status type  Chronic atrial fibrillation (HCC)  Acute cystitis without hematuria    ED Discharge Orders    None       Jacalyn Lefevre, MD 06/30/17 2226

## 2017-06-30 NOTE — H&P (Signed)
Date: 07/01/2017               Patient Name:  Lisa Haynes MRN: [id number]  DOB: [birth year] Age / Sex: 77 y.o., female   PCP: Eulah Pont, MD         Medical Service: Internal Medicine Teaching Service         Attending Physician: Dr. Gust Rung, DO    First Contact: Dr. Lovenia Kim Pager: [phone]  Second Contact: Dr. Samuella Cota Pager: [phone]       After Hours (After 5p/  First Contact Pager: [phone]  weekends / holidays): Second Contact Pager: [phone]   Chief Complaint: Altered Mental Status   History of Present Illness: Lisa Haynes is 77 yo F with a history of paroxysmal A-fib, HLD, HTN, osteoarthritis, diverticulosis, and MDD w/ GAD who presents with acute encephalopathy of an unspecified duration. Of note, the patient was not accompanied by family or friends nor was she oriented to person, location, date, time, recent events or to the reason for her admission.  The patient presented with a variety of symptoms with variable start dates. Again, given her dementia the accuracy of this information is within question. She stated that she has experienced a cough productive of grey phlegm with associated fever of approximately 10 days duration. In addition she has experienced dysuria for ~2 days. Of note, she initially believed she was here as she has been falling repeatedly over the past few weeks-month with her son being unable to care for her properly as per patient. The patient could not clearly recall if she had taken her medications today or any day in the past week. She was clear that she has been falling regularly and that she couldn't clean as she often falls. She did not know of anything that improved any symptoms nor did she know of anything that made them worse. She would become very emotionally labile when gently pressed to answer a question or when one was repeated.   ROS: Patient could only attest to the following symptoms: fever, chills, nausea, vomiting, abdominal  pain. She denied peripheral edema, chest pain, diarrhea or constipation.   ED Course: CT head was unremarkable for acute intracranial abnormality. However, the urinalysis indicated cloudy, moderate Hgb (no RBC's), ketonuria, positive nitrites/leukocytes, and bacteria. The patients CBC was unremarkable, troponin was low at 0.04, I-stat lactate of 0.97 with the CMP indicating an acute renal injury with serum Cr of 1.48 and slightly decreased GFR to 33. CXR failed to demonstrate an acute intrathoracic process. She was determined to most likely have a UTI and started on ceftriaxone with a one liter bolus of fluid given for her AKI.   Meds:  Current Meds  Medication Sig  . alendronate (FOSAMAX) 70 MG tablet Take 1 tablet (70 mg total) by mouth every 7 (seven) days. Take with a full glass of water on an empty stomach.  Marland Kitchen apixaban (ELIQUIS) 5 MG TABS tablet Take 1 tablet (5 mg total) by mouth 2 (two) times daily.  . Calcium Carbonate-Vitamin D (CALCARB 600/D) 600-400 MG-UNIT tablet Take 1 tablet by mouth 2 (two) times daily with a meal.  . cholestyramine (QUESTRAN) 4 g packet MIX AND TAKE 1 PACKET BY MOUTH DAILY AS NEEDED FOR DIARRHEA  . lisinopril (PRINIVIL,ZESTRIL) 5 MG tablet Take 5 mg by mouth daily.  . metoprolol tartrate (LOPRESSOR) 25 MG tablet Take 0.5 tablets (12.5 mg total) by mouth 2 (two) times daily.  . Multiple Vitamins-Minerals (MULTIVITAMIN  WITH MINERALS) tablet Take 1 tablet by mouth daily.  . sertraline (ZOLOFT) 50 MG tablet TAKE 1 TABLET BY MOUTH AT BEDTIME (Patient taking differently: TAKE 1 TABLET (50mg ) BY MOUTH AT BEDTIME)  . simvastatin (ZOCOR) 10 MG tablet Take 1 tablet (10 mg total) by mouth daily.   Allergies: Allergies as of 06/30/2017 - Review Complete 06/30/2017  Allergen Reaction Noted  . Morphine and related Other (See Comments) 03/03/2016   Past Medical History:  Diagnosis Date  . Depression   . GERD (gastroesophageal reflux disease)   . High cholesterol   .  Hypertension   . Osteoarthritis    "legs" (11/11/2015)  . Osteoporosis   . Paroxysmal atrial fibrillation (HCC) 08/20/2014  . Urinary incontinence    Family History:  Family History  Problem Relation Age of Onset  . Heart disease Mother        onset 6580s  . Heart disease Brother 5173   Social History:  Social History   Tobacco Use  . Smoking status: Never Smoker  . Smokeless tobacco: Never Used  Substance Use Topics  . Alcohol use: No    Alcohol/week: 0.0 oz  . Drug use: No   Review of Systems: A ROS was limited by the patients medical status.   Physical Exam: Blood pressure (!) 152/125, pulse 84, temperature 97.6 F (36.4 C), temperature source Oral, resp. rate 18, SpO2 99 %. Physical Exam  Constitutional: She appears well-developed and well-nourished. No distress.  HENT:  Head: Normocephalic and atraumatic.  Eyes: Conjunctivae and EOM are normal. Pupils are equal, round, and reactive to light.  Neck: Normal range of motion. Neck supple.  Cardiovascular: Normal rate and regular rhythm. Exam reveals distant heart sounds.  Pulmonary/Chest: Effort normal. No accessory muscle usage or stridor. Tachypnea noted. No respiratory distress. She has no wheezes. She has rales in the right lower field, the left upper field, the left middle field and the left lower field.  Abdominal: Soft. Bowel sounds are normal. She exhibits no distension. There is no tenderness. There is no guarding.  Musculoskeletal: She exhibits no edema or tenderness.  Neurological: She is alert. She is disoriented. No cranial nerve deficit or sensory deficit. She displays no seizure activity.  Skin: Skin is warm. Capillary refill takes less than 2 seconds. She is not diaphoretic.  Psychiatric: Her affect is labile. Her speech is delayed. Thought content is not delusional. Cognition and memory are impaired. She exhibits abnormal recent memory and abnormal remote memory.  Nursing note and vitals reviewed.  EKG:  personally reviewed my interpretation is sinus rhythm with baseline artifact and regular rate.   CXR: personally reviewed my interpretation is no acute process or focal opacity noted.   Assessment & Plan by Problem: Active Problems:   Hyperlipidemia   Depression with anxiety   Essential hypertension   Paroxysmal atrial fibrillation (HCC)   UTI (urinary tract infection)   Acute renal injury [facility](HCC)  Assessment: 77 yo F who presents with dysuria, encephalopathy, cough, for at least 2 days. Given her presentation it is possible that she is experiencing an acute worsening of her underlying mild dementia as the result of a urinary tract infection which is a common occurrence in the elderly. As such she will be treated with IV antibiotics and observed for improvement. I am concerned that her care at home may not be sufficient given her urinary ketones, ARI, and UIT as she stated that her tub is too high for her to take showers so she  isn't always able to maintain proper hygiene. A social work consult may need to be considered.   UTI: UA consistent with UTI accompanying associated dysuria and urinary frequency.  -Continue ceftriaxone from ED -Urine Cx pending -Urine sensitivities pending -Fluids at 160ml/hr  Elevated troponin: Initial I-stat trop was mildly elevated. We will order a repeat to confirm low and or flat cardiac enzymes. As the repeat will be greater than 10 hours from her last, a decreased or flattening would be reassuring.  -Troponin ordered.   Cough: Lung exam revealed rales in the middle and lower lungs fields. Given her history of cough and previously documented lack of smoking history and relatively unremarkable CXR I believe this to be due to a viral URI vs pneumonia. -Observe on ceftriaxone  -Will hold off on albuterol for now given her severe tachycardia  Acute/chronic encephalopathy: Patient was unable to recall name, DOB and was disoriented to date, time, location and  recent events as well. Unaware of the length of time she has been encephalopathic. This will need to be further clarified when family is available. As per report, the extent of confusion she is currently experiencing appears to be greater than at baseline. -Possibly linked to her acute infection -Will benefit from rehydration and antibiotics -Reassess in the morning  Atrial Fibrillation: Initially noted to be in sinus rhythm This has progressed to atrial fibrillation with a rate near 150 sustained. I believe this to be related to her acute illness.  -Initiated diltiazem gtt with nursing titration to achieve a goal rate <105? -Will discontinue this and continue home medications as her BP tolerates and rhythm improves.  HTN: I suspect her initial hypertension is her baseline. However, with the development of her a-fib with RVR her BP has dropped. Notable concern for ACS given the mildly elevated trop, see above -We will hold her antihypertensives until her BP improves and then resume her home meds as indicated -Holding lisinopril, metoprolol tartrate,   HLD: -Continue home simvastatin 10mg  daily  Acute renal injury: Serum Cr 1.48 up from 0.78 7 months prior. This is most likely due to dehydration as she has not been consuming PO volume resuscitation and has experienced multiple episodes of vomiting the prior day.  -Repeat BMP in am -Continue Normal saline at 166ml/hr overnight or 12 hours until labs result  Depression: Unsure of acute issues here. -Will continue her sertraline at this time and reassess in the morning following initiation of treatment for her UTI.  Diet: Regular Code: Full Fluids: 144ml/hr normal saline GI ppx: n/a VTE ppx: Apixaban Dispo: Admit patient to Inpatient with expected length of stay greater than 2 midnights.  Signed: Lanelle Bal, MD 07/01/2017, 12:44 AM  Pager: Pager# [phone]

## 2017-06-30 NOTE — ED Notes (Signed)
In and out not indicated, patient urinated. Failed attempt for in and out from previous shift.

## 2017-06-30 NOTE — ED Triage Notes (Signed)
Pt arrives by [facility], son reports going to the store and found pt altered from baseline. Reports has been having nausea/vomitting past 2-3 days, states pt has been using bathroom more frequently than normal; both void and stool, but states loose stool is normal for pt and unable to determine whether odor of urine is unusual from normal.  Son of pt relatively recently took over care, is not able to communicate whether this is her baseline.

## 2017-06-30 NOTE — ED Notes (Signed)
BP 80/47, Provider notified, NS bolus started.

## 2017-06-30 NOTE — ED Notes (Signed)
Unable to inn and out cath MD informed

## 2017-07-01 ENCOUNTER — Other Ambulatory Visit: Payer: Self-pay

## 2017-07-01 DIAGNOSIS — F339 Major depressive disorder, recurrent, unspecified: Secondary | ICD-10-CM

## 2017-07-01 DIAGNOSIS — R0989 Other specified symptoms and signs involving the circulatory and respiratory systems: Secondary | ICD-10-CM

## 2017-07-01 DIAGNOSIS — G9349 Other encephalopathy: Secondary | ICD-10-CM

## 2017-07-01 DIAGNOSIS — Z79899 Other long term (current) drug therapy: Secondary | ICD-10-CM

## 2017-07-01 DIAGNOSIS — F0390 Unspecified dementia without behavioral disturbance: Secondary | ICD-10-CM

## 2017-07-01 DIAGNOSIS — R7989 Other specified abnormal findings of blood chemistry: Secondary | ICD-10-CM

## 2017-07-01 DIAGNOSIS — E860 Dehydration: Secondary | ICD-10-CM

## 2017-07-01 DIAGNOSIS — F411 Generalized anxiety disorder: Secondary | ICD-10-CM

## 2017-07-01 DIAGNOSIS — F05 Delirium due to known physiological condition: Secondary | ICD-10-CM

## 2017-07-01 DIAGNOSIS — K5790 Diverticulosis of intestine, part unspecified, without perforation or abscess without bleeding: Secondary | ICD-10-CM

## 2017-07-01 DIAGNOSIS — R296 Repeated falls: Secondary | ICD-10-CM

## 2017-07-01 DIAGNOSIS — Z8249 Family history of ischemic heart disease and other diseases of the circulatory system: Secondary | ICD-10-CM

## 2017-07-01 DIAGNOSIS — E785 Hyperlipidemia, unspecified: Secondary | ICD-10-CM

## 2017-07-01 DIAGNOSIS — I480 Paroxysmal atrial fibrillation: Secondary | ICD-10-CM

## 2017-07-01 DIAGNOSIS — Z7901 Long term (current) use of anticoagulants: Secondary | ICD-10-CM

## 2017-07-01 DIAGNOSIS — M1990 Unspecified osteoarthritis, unspecified site: Secondary | ICD-10-CM

## 2017-07-01 DIAGNOSIS — R05 Cough: Secondary | ICD-10-CM

## 2017-07-01 DIAGNOSIS — Z9181 History of falling: Secondary | ICD-10-CM

## 2017-07-01 DIAGNOSIS — I10 Essential (primary) hypertension: Secondary | ICD-10-CM

## 2017-07-01 DIAGNOSIS — N390 Urinary tract infection, site not specified: Secondary | ICD-10-CM

## 2017-07-01 DIAGNOSIS — N179 Acute kidney failure, unspecified: Secondary | ICD-10-CM

## 2017-07-01 DIAGNOSIS — Z885 Allergy status to narcotic agent status: Secondary | ICD-10-CM

## 2017-07-01 LAB — TROPONIN I: Troponin I: 0.04 ng/mL (ref <0.03)

## 2017-07-01 LAB — CBC
HCT: 39.1 % (ref 36.0–46.0)
HEMOGLOBIN: 12.4 g/dL (ref 12.0–15.0)
MCH: 29.5 pg (ref 26.0–34.0)
MCHC: 31.7 g/dL (ref 30.0–36.0)
MCV: 92.9 fL (ref 78.0–100.0)
PLATELETS: 181 10*3/uL (ref 150–400)
RBC: 4.21 MIL/uL (ref 3.87–5.11)
RDW: 13.6 % (ref 11.5–15.5)
WBC: 7 10*3/uL (ref 4.0–10.5)

## 2017-07-01 LAB — BASIC METABOLIC PANEL
ANION GAP: 12 (ref 5–15)
BUN: 19 mg/dL (ref 6–20)
CO2: 18 mmol/L — ABNORMAL LOW (ref 22–32)
Calcium: 8.1 mg/dL — ABNORMAL LOW (ref 8.9–10.3)
Chloride: 106 mmol/L (ref 101–111)
Creatinine, Ser: 1.96 mg/dL — ABNORMAL HIGH (ref 0.44–1.00)
GFR calc Af Amer: 27 mL/min — ABNORMAL LOW (ref >60)
GFR, EST NON AFRICAN AMERICAN: 23 mL/min — AB (ref >60)
GLUCOSE: 89 mg/dL (ref 65–99)
Potassium: 3.5 mmol/L (ref 3.5–5.1)
Sodium: 136 mmol/L (ref 135–145)

## 2017-07-01 LAB — CK: Total CK: 269 U/L — ABNORMAL HIGH (ref 38–234)

## 2017-07-01 MED ORDER — BENZONATATE 100 MG PO CAPS
200.0000 mg | ORAL_CAPSULE | Freq: Three times a day (TID) | ORAL | Status: DC | PRN
  Administered 2017-07-01 – 2017-07-04 (×2): 200 mg via ORAL
  Filled 2017-07-01 (×2): qty 2

## 2017-07-01 MED ORDER — METOPROLOL TARTRATE 12.5 MG HALF TABLET
12.5000 mg | ORAL_TABLET | Freq: Two times a day (BID) | ORAL | Status: DC
  Administered 2017-07-01 – 2017-07-05 (×9): 12.5 mg via ORAL
  Filled 2017-07-01 (×10): qty 1

## 2017-07-01 MED ORDER — METOPROLOL TARTRATE 25 MG PO TABS: 12.5000 mg | ORAL_TABLET | Freq: Two times a day (BID) | ORAL | Status: DC

## 2017-07-01 MED ORDER — SODIUM CHLORIDE 0.9 % IV SOLN
INTRAVENOUS | Status: AC
  Administered 2017-07-01: 06:00:00 via INTRAVENOUS

## 2017-07-01 MED ORDER — SODIUM CHLORIDE 0.9 % IV SOLN
INTRAVENOUS | Status: AC
  Administered 2017-07-01 – 2017-07-02 (×2): via INTRAVENOUS

## 2017-07-01 MED ORDER — DILTIAZEM HCL-DEXTROSE 100-5 MG/100ML-% IV SOLN (PREMIX): 5.0000 mg/h | INTRAVENOUS | Status: DC

## 2017-07-01 NOTE — Progress Notes (Signed)
New Admission Note:   Arrival Method: Bed Mental Orientation: A&O X1 Telemetry: Not ordered Assessment: Completed Skin: See flowsheets IV: WDL Pain: Denies Safety Measures: Safety Fall Prevention Plan has been given, discussed and signed Admission: Completed Unit Orientation: Patient has been orientated to the room, unit and staff.   Orders have been reviewed and implemented. Will continue to monitor the patient. Call light has been placed within reach and bed alarm has been activated.    Britt BologneseAnisha Mabe RN, BSN

## 2017-07-01 NOTE — Evaluation (Signed)
Physical Therapy Evaluation Patient Details Name: Lisa Haynes MRN: [id number] DOB: Oct 23, 1940 Today's Date: 07/01/2017   History of Present Illness  77 y.o. female admitted on 06/30/17 for acute delirium due to UTI, AKI, and initial A fib with RVR.  Pt with other significant PMh of urinary incontinence, PAF, HTN, and per chart some baseline confusion/dementia.    Clinical Impression  Pt is very weak.  She is hardly able to take pivotal steps to the [facility] and was unable to walk further with RW and therapist assisting.  She is not safe to go home and would benefit from SNF level rehab at discharge.   PT to follow acutely for deficits listed below.        Follow Up Recommendations SNF    Equipment Recommendations  Wheelchair (measurements PT);Wheelchair cushion (measurements PT)    Recommendations for Other Services   NA    Precautions / Restrictions Precautions Precautions: Fall Precaution Comments: recent h/o increasing falls Restrictions Weight Bearing Restrictions: No      Mobility  Bed Mobility Overal bed mobility: Needs Assistance Bed Mobility: Supine to Sit;Sit to Supine     Supine to sit: Mod assist;HOB elevated Sit to supine: Mod assist   General bed mobility comments: Mod assist to help progress legs over EOB and then assist at trunk to pull to sitting.  To return to supine pt needed help at trunk and both legs, letting gravity take her trunk down.   Transfers Overall transfer level: Needs assistance Equipment used: Rolling walker (2 wheeled) Transfers: Sit to/from [employer] Sit to Stand: Mod assist;From elevated surface Stand pivot transfers: Mod assist;From elevated surface       General transfer comment: Mod assist to stand and take 2-3 pivotal steps with RW to [facility].  Mod assist for balance and stability and to assist in powering up to her feet.   Ambulation/Gait             General Gait Details: Started out with plan to walk to the  bathroom with RW, but pt took two steps and I realized this was unsafe and that she would not make it that far.  BSC was brought to bedside.          Balance Overall balance assessment: Needs assistance Sitting-balance support: Feet supported;Bilateral upper extremity supported Sitting balance-Leahy Scale: Fair     Standing balance support: Bilateral upper extremity supported Standing balance-Leahy Scale: Poor Standing balance comment: needs support from RW and therapist.                              Pertinent Vitals/Pain Pain Assessment: Faces Faces Pain Scale: Hurts little more Pain Location: bottom with peri care Pain Descriptors / Indicators: Aching;Burning Pain Intervention(s): Limited activity within patient's tolerance;Monitored during session;Repositioned    Home Living Family/patient expects to be discharged to:: Private residence Living Arrangements: Children Available Help at Discharge: Family;Available PRN/intermittently(son is trying to look for a job) Type of Home: Apartment Home Access: Level entry     Home Layout: One level Home Equipment: Walker - 4 wheels;Cane - quad;Shower seat      Prior Function Level of Independence: Needs assistance   Gait / Transfers Assistance Needed: walks with rollator  ADL's / Homemaking Assistance Needed: sponge bath with assist, assist for dressing, toileting mod I per caregiver, does not cook or clean.  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  Ankles 3-/5, knees 3-/5, hip flexion 3-/5       Communication   Communication: No difficulties  Cognition Arousal/Alertness: Awake/alert Behavior During Therapy: WFL for tasks assessed/performed Overall Cognitive Status: History of cognitive impairments - at baseline                                 General Comments: Pt able to tell me some things at home, but often gets lost in the details.               Assessment/Plan    PT Assessment Patient needs continued PT services  PT Problem List Decreased strength;Decreased activity tolerance;Decreased balance;Decreased mobility;Decreased knowledge of use of DME;Decreased knowledge of precautions;Pain;Obesity       PT Treatment Interventions DME instruction;Gait training;Functional mobility training;Therapeutic activities;Therapeutic exercise;Balance training;Patient/family education    PT Goals (Current goals can be found in the Care Plan section)  Acute Rehab PT Goals Patient Stated Goal: to get back home PT Goal Formulation: With patient Time For Goal Achievement: 07/15/17 Potential to Achieve Goals: Good    Frequency Min 3X/week   Barriers to discharge Decreased caregiver support Pt does not always have 24/7 assist per chart and has repeated falls at home.  She is struggling to stay with her son who is trying to help her best he can.        AM-PAC PT "6 Clicks" Daily Activity  Outcome Measure Difficulty turning over in bed (including adjusting bedclothes, sheets and blankets)?: Unable Difficulty moving from lying on back to sitting on the side of the bed? : Unable Difficulty sitting down on and standing up from a chair with arms (e.g., wheelchair, bedside commode, etc,.)?: Unable Help needed moving to and from a bed to chair (including a wheelchair)?: A Lot Help needed walking in hospital room?: Total Help needed climbing  3-5 steps with a railing? : Total 6 Click Score: 7    End of Session   Activity Tolerance: Patient limited by fatigue Patient left: in bed;with call bell/phone within reach;with bed alarm set Nurse Communication: Mobility status PT Visit Diagnosis: History of falling (Z91.81);Difficulty in walking, not elsewhere classified (R26.2);Pain Pain - Right/Left: (buttocks) Pain - part of body: (buttocks)    Time: [id number] PT Time Calculation (min) (ACUTE ONLY): 21 min   Charges:         Lurena Joinerebecca B. Johnatan Baskette, PT, DPT [phone]   PT Evaluation $PT Eval Moderate Complexity: 1 Mod     07/01/2017, 5:35 PM

## 2017-07-01 NOTE — Progress Notes (Signed)
   Subjective:  No acute events overnight.  Patient was sleeping in bed when seen.  She appears confused and is not able to provide history.  She does deny abdominal pain/fullness and endorses ongoing dysuria.  Discussed with patient we are treating her for a UTI which is the likely culprit of her worsening mental status.  Also discussed ongoing IVF hydration and renal function monitoring.  Objective:  Vital signs in last 24 hours: Vitals:   07/01/17 0700 07/01/17 0845 07/01/17 0900 07/01/17 0930  BP: (!) 117/54 110/79 114/69 132/60  Pulse: 78 85 74 79  Resp: (!) 23 (!) 23 (!) 25 20  Temp:      TempSrc:      SpO2: 96% 100% 95% 97%   Physical Exam  Constitutional: She appears well-developed and well-nourished. No distress.  Sleeping comfortably in bed in no acute distress   Cardiovascular: Normal rate, regular rhythm and normal heart sounds. Exam reveals no gallop and no friction rub.  No murmur heard. Pulmonary/Chest: Effort normal. No respiratory distress.  Abdominal: Soft. Bowel sounds are normal. She exhibits no distension. There is no tenderness.  No suprapubic tenderness   Musculoskeletal: She exhibits no edema.  Neurological: She is alert.  Oriented only to self   Psychiatric:  Thoughts are disorganized and she is unable to provide coherent history     Assessment/Plan:  Active Problems:   Hyperlipidemia   Depression with anxiety   Essential hypertension   Paroxysmal atrial fibrillation (HCC)   UTI (urinary tract infection)   Acute renal injury (HCC)  Lisa Haynes is a 77yo female with history of dementia, HTN, Afib on Eliquis, depression and anxiety who presented with acute worsening of mental status and was found to have a UTI.   # UTI: Patient presented with acute encephalopathy and complains of dysuria. Workup remarkable for UA with leukocyte esterase and nitrites and she was started on Rocephin. Ucx pending. Will plan to switch to oral therapy once culture and  sensitivities are available. - Follow up Ucx and blood cx  - Continue Rocephin 1g QD pending Ucx data   # AKI: Cr 1.4 on admission, normal at baseline. Repeat BMP this AM with Cr 1.96 after staring IVF, but she had only received <1L. Given suspicion for pre-renal etiology, will continue IVF hydration and reassess renal function tomorrow. Consider ordering urine studies if no improvement in renal function.  - NS @ 100 cc/hr  - BMP in AM  - Strict I/Os  # Afib with RVR: Resolved after starting IVF.  - Continue home metoprolol 12.5 mg BID   # HTN  - Continue home metoprolol 12.5 mg BID   # Depression  # Anxiety  - Continue home Zoloft 50 mg QD    Dispo: Anticipated discharge in approximately 1-2 day(s) pending improvement in renal function and urine culture to determine needed antibiotic therapy.   [name], Jahmai Finelli, MD 07/01/2017, 9:55 AM Pager: [phone]

## 2017-07-01 NOTE — ED Notes (Signed)
Messaged pharmacy for missing medications.

## 2017-07-01 NOTE — Progress Notes (Signed)
Patient is confused. Unable to do admission at this time.

## 2017-07-01 NOTE — ED Notes (Signed)
Lunch tray ordered; clear liquid diet 

## 2017-07-01 NOTE — ED Notes (Signed)
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

## 2017-07-01 NOTE — ED Notes (Signed)
HR 80, BP systolic 150s. Dr. Mikey BussingHoffman notified. Cardizem drip not started per provider order.

## 2017-07-01 NOTE — ED Notes (Signed)
Pt cleaned , bed pads added and barrier cream applied to bottom. Pt has loose bowel movements .  Scant amount

## 2017-07-02 LAB — BASIC METABOLIC PANEL
ANION GAP: 9 (ref 5–15)
BUN: 16 mg/dL (ref 6–20)
CHLORIDE: 109 mmol/L (ref 101–111)
CO2: 20 mmol/L — AB (ref 22–32)
Calcium: 8.2 mg/dL — ABNORMAL LOW (ref 8.9–10.3)
Creatinine, Ser: 0.91 mg/dL (ref 0.44–1.00)
GFR calc non Af Amer: 59 mL/min — ABNORMAL LOW (ref >60)
Glucose, Bld: 97 mg/dL (ref 65–99)
POTASSIUM: 3.2 mmol/L — AB (ref 3.5–5.1)
SODIUM: 138 mmol/L (ref 135–145)

## 2017-07-02 MED ORDER — POTASSIUM CHLORIDE CRYS ER 20 MEQ PO TBCR
40.0000 meq | EXTENDED_RELEASE_TABLET | Freq: Two times a day (BID) | ORAL | Status: AC
  Administered 2017-07-02 (×2): 40 meq via ORAL
  Filled 2017-07-02 (×2): qty 2

## 2017-07-02 NOTE — Progress Notes (Signed)
Medicine attending: Clinical status and database reviewed with resident physician Dr. Lovenia KimSantos-Sanchez and I concur with her evaluation and management plan which we discussed together.  [age] year old woman with baseline dementia admitted with acute encephalopathy felt secondary to a urinary tract infection.  She is afebrile.  No acute changes seen on CT brain.  Empiric antibiotics initiated.  Blood cultures negative at 24 hours.  Urine with greater than 100,000 colonies of E. coli.  Sensitivities pending.  She was dehydrated on admission.  Renal function has now normalized with gentle hydration. Oriented to person only.  No focal neurologic deficits other than her mental status. Continue current management plan.

## 2017-07-02 NOTE — Progress Notes (Addendum)
   Subjective:  No acute events overnight. Patient resting comfortably in bed when seen. States she continues to have dysuria but improved from yesterday. She does not voice any other complaints. Discuss will continue to treat with IV antibiotics pending Ucx. Attempted to call son, but no answer.   Objective:  Vital signs in last 24 hours: Vitals:   07/01/17 1415 07/01/17 1904 07/01/17 2114 07/02/17 0503  BP: 121/75  (!) 125/53 111/73  Pulse: (!) 127 85 67 60  Resp: 20 20 18 16   Temp: 98.5 F (36.9 C) 98 F (36.7 C) 98 F (36.7 C) 98.3 F (36.8 C)  TempSrc: Oral Oral Oral Oral  SpO2: 98%  96% 98%  Weight: 184 lb 8.4 oz (83.7 kg)  184 lb 15.5 oz (83.9 kg)    Physical Exam  Constitutional: She appears well-developed and well-nourished.  Patient resting comfortably in bed when seen. She is in no acute distress.   Cardiovascular: Normal rate and regular rhythm. Exam reveals no gallop and no friction rub.  No murmur heard. Pulmonary/Chest: Effort normal and breath sounds normal. No respiratory distress. She has no rales.  Abdominal: Soft. Bowel sounds are normal. She exhibits no distension. There is no tenderness.  No suprapubic tenderness noted   Musculoskeletal: She exhibits no edema.  Neurological: She is alert.  Patient remains oriented only to self. Thought process remains disorganized, though improved from yesterday     Assessment/Plan:  Active Problems:   Hyperlipidemia   Depression with anxiety   Essential hypertension   Paroxysmal atrial fibrillation (HCC)   UTI (urinary tract infection)   Acute renal injury (HCC)  Lisa Haynes is a 77yo female with history of dementia, HTN, Afib on Eliquis, depression and anxiety who presented with acute worsening of mental status and was found to have a UTI.  # UTI: Patient presented with acute encephalopathy and complains of dysuria and found to have a UTI. Has been afebrile and hemodynamically stable. No UOP recorded. Per RN,  she has had several unmeasured urine outputs. Currently on Rocephin pending Ucx results. Plan to switch to PO antibiotic therapy once culture data and sensitivities are available. PT recommended SNF.  - Follow up Ucx and blood cx  - Continue Rocephin 1g QD pending Ucx data (2/28-) - Attempted to call son Lisa Haynes(Lisa Haynes) x 2 but no answer  - SW consult for SNF placement   # AKI: Resolved, SCr 0.91 today. Likely pre-renal given resolution after IVF hydration. Will continue to monitor renal function.  - NS @ 50 cc/hr  - BMP in AM  - Strict I/OS  # Afib:  - Continue home metropolol 12.5 mg BID  - Continue home Eliquis 5 mg BID   # HTN:   - Continue home metoprolol as above   # Depression  # Anxiety  - Continue home Zoloft 50 mg QD   Dispo: Anticipated discharge in approximately 2-3 day(s) pending urine culture and SNF placement.   Lisa Haynes, Lisa Wedig, MD 07/02/2017, 7:43 AM Pager: [phone]

## 2017-07-03 DIAGNOSIS — F0280 Dementia in other diseases classified elsewhere without behavioral disturbance: Secondary | ICD-10-CM

## 2017-07-03 DIAGNOSIS — G301 Alzheimer's disease with late onset: Secondary | ICD-10-CM

## 2017-07-03 DIAGNOSIS — E860 Dehydration: Secondary | ICD-10-CM

## 2017-07-03 LAB — BASIC METABOLIC PANEL
Anion gap: 7 (ref 5–15)
BUN: 13 mg/dL (ref 6–20)
CHLORIDE: 112 mmol/L — AB (ref 101–111)
CO2: 21 mmol/L — AB (ref 22–32)
CREATININE: 0.76 mg/dL (ref 0.44–1.00)
Calcium: 8.5 mg/dL — ABNORMAL LOW (ref 8.9–10.3)
GFR calc Af Amer: >60 mL/min (ref >60)
GFR calc non Af Amer: >60 mL/min (ref >60)
Glucose, Bld: 93 mg/dL (ref 65–99)
Potassium: 3.9 mmol/L (ref 3.5–5.1)
SODIUM: 140 mmol/L (ref 135–145)

## 2017-07-03 LAB — URINE CULTURE: Culture: >=100000 — AB

## 2017-07-03 LAB — TSH: TSH: 2.009 u[IU]/mL (ref 0.350–4.500)

## 2017-07-03 NOTE — Evaluation (Signed)
Occupational Therapy Evaluation Patient Details Name: Lisa Haynes MRN: [id number] DOB: [birth year] Today's Date: 07/03/2017    History of Present Illness 77 y.o. female admitted on 06/30/17 for acute delirium due to UTI, AKI, and initial A fib with RVR.  Pt with other significant PMh of urinary incontinence, PAF, HTN, and per chart some baseline confusion/dementia.     Clinical Impression   PATIENT HAD DIFFICULTY WITH ANSWERING BASIC QUESTIONS. PATIENT WAS ABLE TO FOLLOW ONE STEP DIRECTIONS WITH EXTRA TIME REQUIRED. PATIENT APPEARS TO HAVE INCREASED DIFFICULTY WITH PERFORMING ADLS AND MOBILITY FROM BASELINE. PATIENT TO BE FOLLOWED BY SKILLED OT IN HOSPITAL.     Follow Up Recommendations  SNF    Equipment Recommendations       Recommendations for Other Services       Precautions / Restrictions Precautions Precautions: Fall Precaution Comments: recent h/o increasing falls Restrictions Weight Bearing Restrictions: No      Mobility Bed Mobility         Supine to sit: Mod assist Sit to supine: Mod assist      Transfers       Sit to Stand: Mod assist Stand pivot transfers: Mod assist;From elevated surface            Balance                                           ADL either performed or assessed with clinical judgement   ADL Overall ADL's : Needs assistance/impaired Eating/Feeding: Independent   Grooming: Wash/dry hands;Wash/dry face;Supervision/safety;Sitting   Upper Body Bathing: Minimal assistance;Sitting   Lower Body Bathing: Maximal assistance;Sit to/from stand   Upper Body Dressing : Minimal assistance;Sitting   Lower Body Dressing: Maximal assistance;Total assistance;Sit to/from stand   Toilet Transfer: Moderate assistance;Stand-pivot;BSC   Toileting- Clothing Manipulation and Hygiene: Total assistance;Sit to/from stand       Functional mobility during ADLs: Moderate assistance General ADL Comments: PATIENT APPEARS TO  HAVE INCREASED DIFFICULTY WITH ADLS AND ADL TRANSFERS FROM BASELINE.     Vision Baseline Vision/History: Wears glasses(COULD NOT LOCATE GLASSES) Wears Glasses: At all times Patient Visual Report: No change from baseline       Perception     Praxis      Pertinent Vitals/Pain Pain Assessment: No/denies pain     Hand Dominance Right   Extremity/Trunk Assessment Upper Extremity Assessment Upper Extremity Assessment: Generalized weakness           Communication Communication Communication: No difficulties   Cognition Arousal/Alertness: Awake/alert Behavior During Therapy: WFL for tasks assessed/performed Overall Cognitive Status: History of cognitive impairments - at baseline                                     General Comments       Exercises     Shoulder Instructions      Home Living Family/patient expects to be discharged to:: Private residence Living Arrangements: Children Available Help at Discharge: Family;Available PRN/intermittently(son is trying to look for a job) Type of Home: Apartment Home Access: Level entry     Home Layout: One level     Bathroom Shower/Tub: Chief Strategy Officer: Standard     Home Equipment: Environmental consultant - 4 wheels;Cane - quad;Shower seat          Prior  Functioning/Environment Level of Independence: Needs assistance  Gait / Transfers Assistance Needed: walks with rollator ADL's / Homemaking Assistance Needed: sponge bath with assist, assist for dressing, toileting mod I per caregiver, does not cook or clean.  Struggles with peri care (she wipes from the front and cannot reach her bottom)            OT Problem List: Decreased activity tolerance;Decreased safety awareness;Decreased knowledge of use of DME or AE      OT Treatment/Interventions: Self-care/ADL training;DME and/or AE instruction;Therapeutic activities;Patient/family education    OT Goals(Current goals can be found in the care plan  section) Acute Rehab OT Goals Patient Stated Goal: GO HOME OT Goal Formulation: With patient Time For Goal Achievement: 07/17/17 Potential to Achieve Goals: Good ADL Goals Pt Will Perform Grooming: standing;with min guard assist Pt Will Perform Upper Body Bathing: with supervision;sitting Pt Will Perform Lower Body Bathing: with mod assist;sit to/from stand Pt Will Perform Upper Body Dressing: with supervision;sitting Pt Will Perform Lower Body Dressing: with mod assist;sit to/from stand Pt Will Transfer to Toilet: with min guard assist;bedside commode Pt Will Perform Toileting - Clothing Manipulation and hygiene: with min assist;sit to/from stand  OT Frequency: Min 2X/week   Barriers to D/C:    UNSURE HOW MUCH SUPPORT FAMILY CAN GIVE       Co-evaluation              AM-PAC PT "6 Clicks" Daily Activity     Outcome Measure Help from another person eating meals?: None Help from another person taking care of personal grooming?: A Little Help from another person toileting, which includes using toliet, bedpan, or urinal?: A Lot Help from another person bathing (including washing, rinsing, drying)?: A Lot Help from another person to put on and taking off regular upper body clothing?: A Little Help from another person to put on and taking off regular lower body clothing?: Total 6 Click Score: 15   End of Session Equipment Utilized During Treatment: Gait belt;Rolling walker Nurse Communication: (OK THERAPY)  Activity Tolerance: Patient tolerated treatment well Patient left: in bed;with call bell/phone within reach;with bed alarm set  OT Visit Diagnosis: Unsteadiness on feet (R26.81);Other abnormalities of gait and mobility (R26.89);Muscle weakness (generalized) (M62.81)                Time: [id number] OT Time Calculation (min): 39 min Charges:  OT General Charges $OT Visit: 1 Visit OT Evaluation $OT Eval Moderate Complexity: 1 Mod OT Treatments $Self Care/Home Management :  8-22 mins G-Codes:    6 CLICKS  Jarrin Staley 07/03/2017, 10:12 AM

## 2017-07-03 NOTE — Progress Notes (Signed)
   Subjective:  No acute events overnight.  Patient doing well this morning while eating breakfast.  Mental status unchanged from yesterday.  Per RN, patient had one episode of atrial fibrillation with HR in the 130s seen on telemetry, which resolved spontaneously.  Objective:  Vital signs in last 24 hours: Vitals:   07/02/17 0503 07/02/17 1016 07/02/17 1827 07/02/17 2333  BP: 111/73 120/68 130/65 (!) 156/69  Pulse: 60 64 69 63  Resp: 16 18 20 18   Temp: 98.3 F (36.8 C) 98.3 F (36.8 C) 98.4 F (36.9 C) 98.5 F (36.9 C)  TempSrc: Oral Oral Oral Oral  SpO2: 98% 98% 98% 98%  Weight:       Physical Exam  Constitutional: She appears well-developed and well-nourished. No distress.  HENT:  Head: Normocephalic and atraumatic.  Eyes: Pupils are equal, round, and reactive to light.  Neck: Normal range of motion. Neck supple.  Cardiovascular: Normal rate and regular rhythm. Exam reveals no gallop and no friction rub.  No murmur heard. Pulmonary/Chest: Effort normal and breath sounds normal. No respiratory distress. She has no wheezes. She has no rales.  Abdominal: Soft. Bowel sounds are normal. She exhibits no distension. There is no tenderness.  Musculoskeletal: Normal range of motion. She exhibits no edema.  Neurological: She is alert.  Oriented to self only, able to move all 4 extremities spontaneously, no focal deficits noted  Skin: She is not diaphoretic.    Assessment/Plan:  Lisa Haynes is a 77yo female with history of dementia, HTN, Afib on Eliquis, depression and anxiety who presented with acute worsening of mental status and was found to have a UTI.  # UTI: Patient presented with acute encephalopathy and complains of dysuria and found to have a UTI. Has been afebrile and hemodynamically stable. No UOP recorded. Ucx with pansensitive E. Coli. Will continue Rocephin today (last dose). PT and OT recommended SNF.  - Bcx NGTD  - Continue Rocephin 1g QD pending Ucx data  (2/28-3/3), last dose today   - SW consult for SNF placement   # AKI: Resolved, SCr 0.91 today. Likely pre-renal given resolution after IVF hydration. Will continue to monitor renal function.  - Monitoring renal function   # Afib: One episode of Afib this morning with HR 130s. Resolved spontaneously and remained hemodynamically stable. EKG and TSH ordered.  - Continue home metropolol 12.5 mg BID  - Continue home Eliquis 5 mg BID  - Follow up EKG and TSH   # HTN:   - Continue home metoprolol as above   # Depression  # Anxiety  - Continue home Zoloft 50 mg QD   Dispo: Anticipated discharge in approximately 1-2 day(s) pending SNFplacement.   Lisa CashSantos-Sanchez, Lisa Korenek, MD 07/03/2017, 7:56 AM Pager: [phone]

## 2017-07-03 NOTE — Progress Notes (Signed)
Medicine attending: I examined this patient today together with resident physician Dr. Lovenia KimSantos-Sanchez and Dr. Nyra MarketGorica Svalina and I concur with their evaluation and management plan which we discussed together. She is awake and alert but has no comprehension of person place or time. She is likely back to her baseline mental function.  E. coli urinary tract infection likely etiology of her initial symptoms.  Blood cultures remain negative.  She is afebrile. Cardiac monitor showed that she went into atrial fibrillation today.  Heart rate up to 130.  No decrease in blood pressure at this rate.  No electrolyte abnormalities on chemistry profile. We will continue to monitor.  Treat medically as indicated.  Obtain 12-lead electrocardiogram to confirm.  Check thyroid functions.

## 2017-07-03 NOTE — [place] FL2 (Signed)
[place] MEDICAID FL2 LEVEL OF CARE SCREENING TOOL     IDENTIFICATION  Patient Name: Lisa Haynes Birthdate: 08-20-1940 Sex: female Admission Date (Current Location): 06/30/2017  [facility] [place] [place]   Facility and Address:  [facility], [address], [place], [place] [address]      Provider Number: [id number]  Attending Physician Name and Address:  Gust RungHoffman, Erik C, DO  Relative Name and Phone Number:       Current Level of Care: Hospital Recommended Level of Care: Skilled Nursing Facility Prior Approval Number:    Date Approved/Denied:   PASRR Number: [id number]  Discharge Plan: SNF    Current Diagnoses: Patient Active Problem List   Diagnosis Date Noted  . Dehydration   . Late onset Alzheimer's disease without behavioral disturbance   . Acute renal injury (HCC) 07/01/2017  . E. coli UTI (urinary tract infection) 06/30/2017  . Cough 12/28/2016  . Black stool 12/28/2016  . Pressure injury of skin 11/20/2016  . Inferior pubic ramus fracture (HCC) 11/19/2016  . Hallucinations 09/12/2016  . Unsteady gait 05/29/2016  . Atrial flutter (HCC) 11/20/2015  . 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 Paroxysmal atrial fibrillation (HCC) 09/25/2015  . Diverticulosis 06/20/2014  . Preventative health care 10/15/2010  . Hyperlipidemia 01/29/2009  . GERD 04/15/2008  . Depression with anxiety 09/01/2007  . Essential hypertension 07/13/2006  . Osteoporosis 07/13/2006    Orientation RESPIRATION BLADDER Height & Weight     Self  Normal Incontinent Weight: 184 lb 15.5 oz (83.9 kg) Height:     BEHAVIORAL SYMPTOMS/MOOD NEUROLOGICAL BOWEL NUTRITION STATUS      Incontinent Diet(Regular diet, thin liquids)  AMBULATORY STATUS COMMUNICATION OF NEEDS Skin   Extensive Assist Verbally PU Stage and Appropriate Care(Pressur injury: Buttocks)                       Personal Care Assistance Level of Assistance  Bathing, Feeding,  Dressing Bathing Assistance: Maximum assistance Feeding assistance: Independent Dressing Assistance: Maximum assistance     Functional Limitations Info  Sight, Hearing, Speech Sight Info: Adequate Hearing Info: Adequate Speech Info: Adequate    SPECIAL CARE FACTORS FREQUENCY  PT (By licensed PT), OT (By licensed OT)     PT Frequency: 3x OT Frequency: 3x            Contractures Contractures Info: Not present    Additional Factors Info  Code Status, Allergies Code Status Info: Full Code Allergies Info: Morphine and related           Current Medications (07/03/2017):  This is the current hospital active medication list Current Facility-Administered Medications  Medication Dose Route Frequency Provider Last Rate Last Dose  . acetaminophen (TYLENOL) tablet 650 mg  650 mg Oral Q6H PRN Camelia PhenesHoffman, Jessica Ratliff, DO   650 mg at 07/01/17 2224   Or  . acetaminophen (TYLENOL) suppository 650 mg  650 mg Rectal Q6H PRN Geralyn CorwinHoffman, Jessica Ratliff, DO      . apixaban (ELIQUIS) tablet 5 mg  5 mg Oral BID Geralyn CorwinHoffman, Jessica Ratliff, DO   5 mg at 07/03/17 0919  . benzonatate (TESSALON) capsule 200 mg  200 mg Oral TID PRN Geralyn CorwinHoffman, Jessica Ratliff, DO   200 mg at 07/01/17 2224  . cefTRIAXone (ROCEPHIN) 1 g in sodium chloride 0.9 % 100 mL IVPB  1 g Intravenous Q24H Camelia PhenesHoffman, Jessica Ratliff, DO   Stopped at 07/03/17 0003  . metoprolol tartrate (LOPRESSOR) tablet 12.5 mg  12.5 mg Oral BID Gust Rung, DO   12.5 mg at 07/03/17 1610  . senna-docusate (Senokot-S) tablet 1 tablet  1 tablet Oral QHS PRN Geralyn Corwin Ratliff, DO      . sertraline (ZOLOFT) tablet 50 mg  50 mg Oral QHS Geralyn Corwin Ratliff, DO   50 mg at 07/02/17 2334  . simvastatin (ZOCOR) tablet 10 mg  10 mg Oral Daily Geralyn Corwin Ratliff, DO   10 mg at 07/03/17 9604     Discharge Medications: Please see discharge summary for a list of discharge medications.  Relevant Imaging Results:  Relevant Lab  Results:   Additional Information SSN: [id number]  Maree Krabbe, LCSW

## 2017-07-03 NOTE — Progress Notes (Signed)
Patient is currently in Afib. MD notified and made aware. Will continue to monitor.

## 2017-07-04 DIAGNOSIS — F0282 Dementia in other diseases classified elsewhere, unspecified severity, with psychotic disturbance: Secondary | ICD-10-CM | POA: Diagnosis present

## 2017-07-04 DIAGNOSIS — G309 Alzheimer's disease, unspecified: Secondary | ICD-10-CM

## 2017-07-04 DIAGNOSIS — F05 Delirium due to known physiological condition: Secondary | ICD-10-CM

## 2017-07-04 DIAGNOSIS — F0280 Dementia in other diseases classified elsewhere without behavioral disturbance: Secondary | ICD-10-CM | POA: Diagnosis present

## 2017-07-04 DIAGNOSIS — I4891 Unspecified atrial fibrillation: Secondary | ICD-10-CM

## 2017-07-04 DIAGNOSIS — F419 Anxiety disorder, unspecified: Secondary | ICD-10-CM

## 2017-07-04 LAB — BASIC METABOLIC PANEL
Anion gap: 10 (ref 5–15)
BUN: 12 mg/dL (ref 6–20)
CHLORIDE: 110 mmol/L (ref 101–111)
CO2: 20 mmol/L — ABNORMAL LOW (ref 22–32)
Calcium: 8.8 mg/dL — ABNORMAL LOW (ref 8.9–10.3)
Creatinine, Ser: 0.74 mg/dL (ref 0.44–1.00)
GFR calc Af Amer: >60 mL/min (ref >60)
GFR calc non Af Amer: >60 mL/min (ref >60)
GLUCOSE: 82 mg/dL (ref 65–99)
POTASSIUM: 3.9 mmol/L (ref 3.5–5.1)
Sodium: 140 mmol/L (ref 135–145)

## 2017-07-04 NOTE — Care Management Important Message (Signed)
Important Message  Patient Details  Name: Lisa Haynes J Paster MRN: [id number] Date of Birth: April 01, 1941   Medicare Important Message Given:  Yes    Dorena BodoIris Terreon Ekholm 07/04/2017, 12:25 PM

## 2017-07-04 NOTE — Progress Notes (Signed)
   Subjective:  No acute events overnight. Patient doing well this morning. Denies abdominal or suprapubic pain. Did not voice any complaints today. Discussed with patient discharge plan pending SNF placement.   Objective:  Vital signs in last 24 hours: Vitals:   07/03/17 1724 07/03/17 2005 07/03/17 2208 07/04/17 0517  BP: (!) 127/53 (!) 142/86  (!) 127/55  Pulse: (!) 58 69  (!) 54  Resp: 18 16  16   Temp: 98 F (36.7 C) 98.7 F (37.1 C)  98.1 F (36.7 C)  TempSrc: Oral Oral  Oral  SpO2: 98% 99%  100%  Weight:   190 lb 14.7 oz (86.6 kg)    Physical Exam  Constitutional: She appears well-developed and well-nourished. No distress.  HENT:  Head: Normocephalic and atraumatic.  Neck: Normal range of motion. Neck supple.  Cardiovascular: Normal rate, regular rhythm and normal heart sounds. Exam reveals no gallop and no friction rub.  No murmur heard. Pulmonary/Chest: Effort normal and breath sounds normal. No respiratory distress. She has no wheezes. She has no rales.  Abdominal: Soft. Bowel sounds are normal. She exhibits no distension. There is no tenderness.  Musculoskeletal: She exhibits no edema.  Neurological: She is alert.  Oriented only to self   Skin: Skin is warm. No rash noted. No erythema.    Assessment/Plan:  Lisa Haynes is a 77yo female with history of dementia, HTN, Afib on Eliquis, depression and anxiety who presented with acute worsening of mental status and was found to have a UTI.  # WUJ:WJXBJYNTI:Patient presented with acute encephalopathy and complains of dysuria and found to have a UTI. Has been afebrile and hemodynamically stable. Ucx with pansensitive E. Coli. WCompleted 4 days of Rocephin. PT and OT recommended SNF.  - Bcx NG at 3 days  - SW consult for SNF placement  # AKI: Resolved  # Afib: Hemodynamically stable. TSH nl. On home metoprolol.  - Continue home metropolol 12.5 mg BID - Continue home Eliquis 5 mg BID  # HTN:  - Continue home  metoprolol as above   # Depression  # Anxiety  - Continue home Zoloft 50 mg QD   Dispo: Anticipated discharge in approximately 1 day(s) pending SNF placement.   [name], [name], MD 07/04/2017, 7:17 AM Pager: [phone]
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
## 2017-07-04 NOTE — Clinical Social Work Placement (Signed)
   CLINICAL SOCIAL WORK PLACEMENT  NOTE  Date:  07/04/2017  Patient Details  Name: Lisa Haynes MRN: [id number] Date of Birth: 03-17-1941  Clinical Social Work is seeking post-discharge placement for this patient at the Skilled  Nursing Facility level of care (*CSW will initial, date and re-position this form in  chart as items are completed):  Yes   Patient/family provided with [facility] Clinical Social Work Department's list of facilities offering this level of care within the geographic area requested by the patient (or if unable, by the patient's family).  Yes   Patient/family informed of their freedom to choose among providers that offer the needed level of care, that participate in Medicare, Medicaid or managed care program needed by the patient, have an available bed and are willing to accept the patient.  Yes   Patient/family informed of [facility]'s ownership interest in [facility] and [facility], as well as of the fact that they are under no obligation to receive care at these facilities.  PASRR submitted to EDS on       PASRR number received on       Existing PASRR number confirmed on 07/04/17     FL2 transmitted to all facilities in geographic area requested by pt/family on 07/04/17     FL2 transmitted to all facilities within larger geographic area on       Patient informed that his/her managed care company has contracts with or will negotiate with certain facilities, including the following:            Patient/family informed of bed offers received.  Patient chooses bed at       Physician recommends and patient chooses bed at      Patient to be transferred to   on  .  Patient to be transferred to facility by       Patient family notified on   of transfer.  Name of family member notified:        PHYSICIAN       Additional Comment:    _______________________________________________ Cristobal Goldmannrawford, Misaki Sozio Bradley, LCSW 07/04/2017, 2:15 PM

## 2017-07-05 DIAGNOSIS — H109 Unspecified conjunctivitis: Secondary | ICD-10-CM | POA: Diagnosis not present

## 2017-07-05 DIAGNOSIS — S31819A Unspecified open wound of right buttock, initial encounter: Secondary | ICD-10-CM | POA: Diagnosis not present

## 2017-07-05 DIAGNOSIS — I10 Essential (primary) hypertension: Secondary | ICD-10-CM | POA: Diagnosis not present

## 2017-07-05 DIAGNOSIS — E785 Hyperlipidemia, unspecified: Secondary | ICD-10-CM | POA: Diagnosis not present

## 2017-07-05 DIAGNOSIS — R260 Ataxic gait: Secondary | ICD-10-CM | POA: Diagnosis not present

## 2017-07-05 DIAGNOSIS — I482 Chronic atrial fibrillation, unspecified: Secondary | ICD-10-CM

## 2017-07-05 DIAGNOSIS — B9620 Unspecified Escherichia coli [E. coli] as the cause of diseases classified elsewhere: Secondary | ICD-10-CM

## 2017-07-05 DIAGNOSIS — G301 Alzheimer's disease with late onset: Secondary | ICD-10-CM

## 2017-07-05 DIAGNOSIS — F418 Other specified anxiety disorders: Secondary | ICD-10-CM

## 2017-07-05 DIAGNOSIS — M6281 Muscle weakness (generalized): Secondary | ICD-10-CM | POA: Diagnosis not present

## 2017-07-05 DIAGNOSIS — R41841 Cognitive communication deficit: Secondary | ICD-10-CM | POA: Diagnosis not present

## 2017-07-05 DIAGNOSIS — R2689 Other abnormalities of gait and mobility: Secondary | ICD-10-CM | POA: Diagnosis not present

## 2017-07-05 DIAGNOSIS — R269 Unspecified abnormalities of gait and mobility: Secondary | ICD-10-CM | POA: Diagnosis not present

## 2017-07-05 DIAGNOSIS — G9349 Other encephalopathy: Secondary | ICD-10-CM | POA: Diagnosis not present

## 2017-07-05 DIAGNOSIS — F419 Anxiety disorder, unspecified: Secondary | ICD-10-CM | POA: Diagnosis not present

## 2017-07-05 DIAGNOSIS — Z7901 Long term (current) use of anticoagulants: Secondary | ICD-10-CM | POA: Diagnosis not present

## 2017-07-05 DIAGNOSIS — M818 Other osteoporosis without current pathological fracture: Secondary | ICD-10-CM | POA: Diagnosis not present

## 2017-07-05 DIAGNOSIS — F0390 Unspecified dementia without behavioral disturbance: Secondary | ICD-10-CM | POA: Diagnosis not present

## 2017-07-05 DIAGNOSIS — R2681 Unsteadiness on feet: Secondary | ICD-10-CM | POA: Diagnosis not present

## 2017-07-05 DIAGNOSIS — F0280 Dementia in other diseases classified elsewhere without behavioral disturbance: Secondary | ICD-10-CM | POA: Diagnosis not present

## 2017-07-05 DIAGNOSIS — R278 Other lack of coordination: Secondary | ICD-10-CM | POA: Diagnosis not present

## 2017-07-05 DIAGNOSIS — N390 Urinary tract infection, site not specified: Secondary | ICD-10-CM | POA: Diagnosis not present

## 2017-07-05 DIAGNOSIS — R4182 Altered mental status, unspecified: Secondary | ICD-10-CM | POA: Diagnosis not present

## 2017-07-05 DIAGNOSIS — R531 Weakness: Secondary | ICD-10-CM | POA: Diagnosis not present

## 2017-07-05 DIAGNOSIS — I480 Paroxysmal atrial fibrillation: Secondary | ICD-10-CM | POA: Diagnosis not present

## 2017-07-05 DIAGNOSIS — N3000 Acute cystitis without hematuria: Secondary | ICD-10-CM

## 2017-07-05 DIAGNOSIS — F332 Major depressive disorder, recurrent severe without psychotic features: Secondary | ICD-10-CM | POA: Diagnosis not present

## 2017-07-05 DIAGNOSIS — I4891 Unspecified atrial fibrillation: Secondary | ICD-10-CM | POA: Diagnosis not present

## 2017-07-05 DIAGNOSIS — F05 Delirium due to known physiological condition: Secondary | ICD-10-CM | POA: Diagnosis not present

## 2017-07-05 DIAGNOSIS — R32 Unspecified urinary incontinence: Secondary | ICD-10-CM | POA: Diagnosis not present

## 2017-07-05 DIAGNOSIS — G9340 Encephalopathy, unspecified: Secondary | ICD-10-CM | POA: Diagnosis not present

## 2017-07-05 NOTE — Progress Notes (Signed)
   Subjective:  No acute events overnight. Patient continues to do well and denies abdominal pain and dysuria. Mental status at baseline (oriented to self onyl). Discussed will discharge to SNF for further rehabilitation. Patient in agreement with plan.   Objective:  Vital signs in last 24 hours: Vitals:   07/04/17 0755 07/04/17 1721 07/04/17 2132 07/05/17 0531  BP: (!) 133/57 (!) 128/58 (!) 169/63 (!) 101/43  Pulse: 60 64 (!) 59 (!) 48  Resp: 16 16 17 18   Temp: 98.3 F (36.8 C) 98.5 F (36.9 C) 98.2 F (36.8 C) 98.2 F (36.8 C)  TempSrc: Oral Oral Oral Oral  SpO2: 99% 98% 95% 95%  Weight:       Physical Exam  Constitutional: She appears well-developed and well-nourished. No distress.  HENT:  Head: Normocephalic and atraumatic.  Neck: Normal range of motion. Neck supple.  Cardiovascular: Normal rate, regular rhythm and normal heart sounds. Exam reveals no gallop and no friction rub.  No murmur heard. Pulmonary/Chest: Effort normal and breath sounds normal. No respiratory distress. She has no wheezes.  Abdominal: Soft. Bowel sounds are normal. She exhibits no distension. There is no tenderness.  Musculoskeletal: Normal range of motion. She exhibits no edema.  Neurological: She is alert.  Oriented to self only, no focal deficits noted   Skin: Skin is warm.     Assessment/Plan:  Active Problems:   Hyperlipidemia   Depression with anxiety   Essential hypertension   Paroxysmal atrial fibrillation (HCC)   E. 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  # Acute encephalopathy in the setting of ZOX:WRUEAVWTI:Patient presented with acute encephalopathy and  complains of dysuria and found to have a UTI. Has been afebrile and hemodynamically stable.Ucx with pansensitive E. Coli and she completed 4 days of Rocephin.She will be discharged to SNF today.  - Bcx NG at 4 days  - Anticipate discharge today   # AKI: Resolved  # Afib:Hemodynamically stable. TSH nl. On home metoprolol.  - Continue home metropolol 12.5 mg BID - Continue home Eliquis 5 mg BID  # HTN:  - Continue home metoprolol as above   # Depression  # Anxiety  - Continue home Zoloft 50 mg QD   Dispo: Anticipated discharge today.  [name], Cherokee Clowers, MD 07/05/2017, 6:27 AM Pager: [phone]

## 2017-07-05 NOTE — Discharge Summary (Signed)
Name: Lisa Haynes MRN: [id number] DOB: 1940/07/10 77 y.o. PCP: [name], Nina, MD  Date of Admission: 06/30/2017  4:30 PM Date of Discharge: 07/05/2017 Attending Physician: Gust RungHoffman, Erik C, DO  Discharge Diagnosis: 1. Acute encephalopathy  2. E. Coli UTI 3. Atrial fibrillation   Active Problems:   Hyperlipidemia   Depression with anxiety   Essential hypertension   Paroxysmal atrial fibrillation (HCC)   E. 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 Take with a full glass of water on an empty stomach.   apixaban 5 MG Tabs tablet Commonly known as:  ELIQUIS Take 1 tablet (5 mg total) by mouth 2 (two) times daily.   Calcium Carbonate-Vitamin D 600-400 MG-UNIT tablet Commonly known as:  CALCARB 600/D Take 1 tablet by mouth 2 (two) times daily with a meal.   cholestyramine 4 g packet Commonly known as:  QUESTRAN MIX AND TAKE 1 PACKET BY MOUTH DAILY AS NEEDED FOR DIARRHEA   lisinopril 5 MG tablet Commonly known as:  PRINIVIL,ZESTRIL Take 5 mg by mouth daily.   metoprolol tartrate 25 MG tablet Commonly known as:  LOPRESSOR Take 0.5 tablets (12.5 mg total) by mouth 2 (two) times daily.   multivitamin with minerals tablet Take 1 tablet by mouth daily.   nitroGLYCERIN 0.4 MG SL tablet Commonly known as:  NITROSTAT DISSOLVE 1 TABLET UNDER THE TONGUE EVERY 5 MINUTES AS NEEDED FOR CHEST PAIN   sertraline 50 MG tablet Commonly known as:  ZOLOFT TAKE 1 TABLET BY MOUTH AT BEDTIME What changed:    how much to take  how  to take this  when to take this   simvastatin 10 MG tablet Commonly known as:  ZOCOR Take 1 tablet (10 mg total) by mouth daily.       Disposition and follow-up:   Lisa Hay[facility] St[facility].  At the hospital follow up visit please address:  1.  Please assess for ongoing symptoms of UTI.   2.  Labs / imaging needed at time of follow-up: Consider BMP to check renal function  3.  Pending labs/ test needing follow-up: None   Follow-up Appointments: Please schedule an appointment with your primary care doctor, Dr. Obie DredgeBlum.    Hospital Course by problem list: Active Problems:   Hyperlipidemia   Depression with anxiety   Essential hypertension   Paroxysmal atrial fibrillation (HCC)   E. 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 Acute encephalopathy in the set ting of UTI: Patient presented from home with AMS, abdominal pain and dysuria. UA showed leukocytes esterase and nitrites and she was started on Rocephin for UTI treatment. Ucx grew pan-sensitive E. Coli. She completed 4 days of Rocephin with resolution of symptoms. Mental status at baseline on day of discharge.   2. Atrial  fibrillation: Patient has a history of paroxysmal Afib that is treated with metoprolol. She went into Afib with RVR on presentation that resolved spontaneously after receiving IVF. She remained hemodynamically stable during this admission and was in normal sinus rhythm on day of discharge. She was continued on her home metoprolol.    Discharge Vitals:   BP (!) 148/42 (BP Location: Right Arm)   Pulse (!) 58   Temp 98.5 F (36.9 C) (Oral)   Resp 18   Wt 190 lb 14.7 oz (86.6 kg)   SpO2 97%   BMI 36.07 kg/m   Pertinent Labs, Studies, and Procedures:  BMP Latest Ref Rng & Units 07/04/2017 07/03/2017  07/02/2017  Glucose 65 - 99 mg/dL 82 93 97  BUN 6 - 20 mg/dL 12 13 16   Creatinine 0.44 - 1.00 mg/dL 1.61 0.96 0.45  Sodium 135 - 145 mmol/L 140 140 138  Potassium 3.5 - 5.1 mmol/L 3.9 3.9 3.2(L)  Chloride 101 - 111 mmol/L 110 112(H) 109  CO2 22 - 32 mmol/L 20(L) 21(L) 20(L)  Calcium 8.9 - 10.3 mg/dL 4.0(J) 8.1(X) 9.1(Y)   Urinalysis    Component Value Date/Time   COLORURINE YELLOW 06/30/2017 2058   APPEARANCEUR CLOUDY (A) 06/30/2017 2058   LABSPEC 1.023 06/30/2017 2058   PHURINE 5.0 06/30/2017 2058   GLUCOSEU NEGATIVE 06/30/2017 2058   HGBUR MODERATE (A) 06/30/2017 2058   BILIRUBINUR NEGATIVE 06/30/2017 2058   BILIRUBINUR neg 11/11/2015 1712   KETONESUR 20 (A) 06/30/2017 2058   PROTEINUR 30 (A) 06/30/2017 2058   UROBILINOGEN 0.2 11/11/2015 1712   UROBILINOGEN 0.2 09/27/2014 0416   NITRITE POSITIVE (A) 06/30/2017 2058   LEUKOCYTESUR LARGE (A) 06/30/2017 2058   CK 269  TSH 2.0  CXR 2/28: FINDINGS: No focal pulmonary infiltrate or effusion. Stable borderline cardiomegaly with aortic atherosclerosis. No pneumothorax. Mild wedging deformities of mid to lower thoracic vertebra as before.  Discharge Instructions: Discharge Instructions    Call MD for:  extreme fatigue   Complete by:  As directed    Call MD for:  severe uncontrolled pain   Complete by:  As directed    Call MD for:  temperature >100.4   Complete by:  As directed    Diet - low sodium heart healthy   Complete by:  As directed    Discharge instructions   Complete by:  As directed    Lisa Haynes,   You were admitted to the hospital with confusion due to a urinary tract infection. You were treated with antibiotics.   Please continue taking your medications as usual.   Please call us if you have any questions.   Increase activity slowly   Complete by:  As directed       Signed: Burna Cash, MD 07/05/2017, 12:01 PM   Pager: [phone]

## 2017-07-05 NOTE — Clinical Social Work Placement (Signed)
   CLINICAL SOCIAL WORK PLACEMENT  NOTE 07/05/17 - [name]  Date:  07/05/2017  Patient Details  Name: Lisa Haynes MRN: [id number] Date of Birth: 1941-02-16  Clinical Social Work is seeking post-discharge placement for this patient at the Skilled  Nursing Facility level of care (*CSW will initial, date and re-position this form in  chart as items are completed):  Yes   Patient/family provided with [facility] Clinical Social Work Department's list of facilities offering this level of care within the geographic area requested by the patient (or if unable, by the patient's family).  Yes   Patient/family informed of their freedom to choose among providers that offer the needed level of care, that participate in Medicare, Medicaid or managed care program needed by the patient, have an available bed and are willing to accept the patient.  Yes   Patient/family informed of [facility]'s ownership interest in [facility] and [facility], as well as of the fact that they are under no obligation to receive care at these facilities.  PASRR submitted to EDS on       PASRR number received on       Existing PASRR number confirmed on 07/04/17     FL2 transmitted to all facilities in geographic area requested by pt/family on 07/04/17     FL2 transmitted to all facilities within larger geographic area on       Patient informed that his/her managed care company has contracts with or will negotiate with certain facilities, including the following:         Yes - Patient/family informed of bed offers received.  [facility]      Physician recommends and patient chooses bed at      [facility] be [facility] on  07/05/17.  Patient to be transferred to facility by  ambulance     Patient/family notified on  07/05/17 of transfer.  Name of family member notified:   Lisa Fae PippinDanny Haynes by phone [phone]).     PHYSICIAN       Additional Comment:  07/05/17 - CSW informed by Wille CelesteJanie, admissions director at [facility] that she received insurance authorization today from [employer].    _______________________________________________ Cristobal Goldmannrawford, Yariela Tison Bradley, LCSW 07/05/2017, 7:07 PM

## 2017-07-05 NOTE — Progress Notes (Signed)
Report given to [name] at [name]. Patient going to room 201. [facility] to be called for transport.

## 2017-07-05 NOTE — Progress Notes (Signed)
Physical Therapy Treatment Patient Details Name: Lisa Haynes MRN: [id number] DOB: 05-06-1940 Today's Date: 07/05/2017    History of Present Illness 77 y.o. female admitted on 06/30/17 for acute delirium due to UTI, AKI, and initial A fib with RVR.  Pt with other significant PMh of urinary incontinence, PAF, HTN, and per chart some baseline confusion/dementia.      PT Comments    Pt requiring mod A to get to EOB, min A to stand and ambulate 20' with RW. Pt confused and worried about clothes that she feels have been lost while hospitalized. Continues to recommend SNF level care upon d/c. PT will continue to follow.    Follow Up Recommendations  SNF     Equipment Recommendations  Wheelchair (measurements PT);Wheelchair cushion (measurements PT)    Recommendations for Other Services       Precautions / Restrictions Precautions Precautions: Fall Precaution Comments: recent h/o increasing falls Restrictions Weight Bearing Restrictions: No    Mobility  Bed Mobility Overal bed mobility: Needs Assistance Bed Mobility: Rolling;Supine to Sit Rolling: Min assist   Supine to sit: Mod assist     General bed mobility comments: pt able to roll to both sides with use of rail and min A for bowel clean up. Mod A for trunk elevation into sitting. Pt needed increased time and min A to scoot to EOB.   Transfers Overall transfer level: Needs assistance Equipment used: Rolling walker (2 wheeled) Transfers: Sit to/from Stand Sit to Stand: Min assist         General transfer comment: min A for safety to stand from bed, vc's for hand placement  Ambulation/Gait Ambulation/Gait assistance: Min assist Ambulation Distance (Feet): 20 Feet Assistive device: Rolling walker (2 wheeled) Gait Pattern/deviations: Step-through pattern;Decreased stride length;Shuffle;Trunk flexed Gait velocity: decreased Gait velocity interpretation: <1.8 ft/sec, indicative of risk for recurrent falls General  Gait Details: pt walked out to hallway and back, slow gait but steady with RW on straightaway, min A needed for safety when turning RW   Stairs            Wheelchair Mobility    Modified Rankin (Stroke Patients Only)       Balance Overall balance assessment: Needs assistance Sitting-balance support: Feet supported;Bilateral upper extremity supported Sitting balance-Leahy Scale: Fair     Standing balance support: Bilateral upper extremity supported Standing balance-Leahy Scale: Poor Standing balance comment: needs UE support                            Cognition Arousal/Alertness: Awake/alert Behavior During Therapy: WFL for tasks assessed/performed Overall Cognitive Status: History of cognitive impairments - at baseline                                 General Comments: pt gets confused       Exercises      General Comments General comments (skin integrity, edema, etc.): pt has several open places on bottom, very painful to touch      Pertinent Vitals/Pain Pain Assessment: Faces Faces Pain Scale: Hurts even more Pain Location: bottom with peri care Pain Descriptors / Indicators: Aching;Burning Pain Intervention(s): Limited activity within patient's tolerance;Monitored during session;Other (comment)(NT brought bandage)    Home Living                      Prior Function  PT Goals (current goals can now be found in the care plan section) Acute Rehab PT Goals Patient Stated Goal: go home PT Goal Formulation: With patient Time For Goal Achievement: 07/15/17 Potential to Achieve Goals: Good Progress towards PT goals: Progressing toward goals    Frequency    Min 2X/week      PT Plan Frequency needs to be updated    Haynes-evaluation              AM-PAC PT "6 Clicks" Daily Activity  Outcome Measure  Difficulty turning over in bed (including adjusting bedclothes, sheets and blankets)?:  Unable Difficulty moving from lying on back to sitting on the side of the bed? : Unable Difficulty sitting down on and standing up from a chair with arms (e.g., wheelchair, bedside commode, etc,.)?: Unable Help needed moving to and from a bed to chair (including a wheelchair)?: A Little Help needed walking in hospital room?: A Little Help needed climbing 3-5 steps with a railing? : Total 6 Click Score: 10    End of Session Equipment Utilized During Treatment: Gait belt Activity Tolerance: Patient tolerated treatment well Patient left: in chair;with call bell/phone within reach;with chair alarm set Nurse Communication: Mobility status PT Visit Diagnosis: History of falling (Z91.81);Difficulty in walking, not elsewhere classified (R26.2);Pain Pain - Right/Left: (buttocks)     Time: 1610-9604 PT Time Calculation (min) (ACUTE ONLY): 38 min  Charges:  $Gait Training: 8-22 mins $Therapeutic Activity: 23-37 mins                    G Codes:       Lisa Haynes, PT  Acute Rehab Services  [phone]    Lisa Haynes 07/05/2017, 3:35 PM

## 2017-07-05 NOTE — Progress Notes (Signed)
Patient discharged to SNF via PTAR. Vital signs stable and belongings with patient and paperwork forwarded with patient.

## 2017-07-05 NOTE — Discharge Instructions (Signed)

## 2017-07-06 DIAGNOSIS — F0390 Unspecified dementia without behavioral disturbance: Secondary | ICD-10-CM | POA: Diagnosis not present

## 2017-07-06 DIAGNOSIS — R531 Weakness: Secondary | ICD-10-CM | POA: Diagnosis not present

## 2017-07-06 DIAGNOSIS — N390 Urinary tract infection, site not specified: Secondary | ICD-10-CM | POA: Diagnosis not present

## 2017-07-06 DIAGNOSIS — I10 Essential (primary) hypertension: Secondary | ICD-10-CM | POA: Diagnosis not present

## 2017-07-06 DIAGNOSIS — I4891 Unspecified atrial fibrillation: Secondary | ICD-10-CM | POA: Diagnosis not present

## 2017-07-06 DIAGNOSIS — E785 Hyperlipidemia, unspecified: Secondary | ICD-10-CM | POA: Diagnosis not present

## 2017-07-06 LAB — CULTURE, BLOOD (ROUTINE X 2)
CULTURE: NO GROWTH
Culture: NO GROWTH
SPECIAL REQUESTS: ADEQUATE
SPECIAL REQUESTS: ADEQUATE

## 2017-07-13 DIAGNOSIS — I4891 Unspecified atrial fibrillation: Secondary | ICD-10-CM | POA: Diagnosis not present

## 2017-07-13 DIAGNOSIS — G301 Alzheimer's disease with late onset: Secondary | ICD-10-CM | POA: Diagnosis not present

## 2017-07-13 DIAGNOSIS — N390 Urinary tract infection, site not specified: Secondary | ICD-10-CM | POA: Diagnosis not present

## 2017-07-13 DIAGNOSIS — I10 Essential (primary) hypertension: Secondary | ICD-10-CM | POA: Diagnosis not present

## 2017-07-13 DIAGNOSIS — S31819A Unspecified open wound of right buttock, initial encounter: Secondary | ICD-10-CM | POA: Diagnosis not present

## 2017-07-20 DIAGNOSIS — G301 Alzheimer's disease with late onset: Secondary | ICD-10-CM | POA: Diagnosis not present

## 2017-07-20 DIAGNOSIS — I10 Essential (primary) hypertension: Secondary | ICD-10-CM | POA: Diagnosis not present

## 2017-07-20 DIAGNOSIS — I4891 Unspecified atrial fibrillation: Secondary | ICD-10-CM | POA: Diagnosis not present

## 2017-07-20 DIAGNOSIS — R531 Weakness: Secondary | ICD-10-CM | POA: Diagnosis not present

## 2017-07-22 ENCOUNTER — Telehealth: Payer: Self-pay | Admitting: Internal Medicine

## 2017-07-22 NOTE — Telephone Encounter (Signed)
[facility] WILL START HOME VISIT  07/26/17

## 2017-07-22 NOTE — Telephone Encounter (Signed)
Just FYI.

## 2017-07-25 ENCOUNTER — Other Ambulatory Visit: Payer: Self-pay

## 2017-07-25 NOTE — Patient Outreach (Signed)
[facility]) Care Management  07/25/2017  Lisa Haynes 11-11-1940 [id number]     Transition of Care Referral  Referral Date: 07/25/17 Referral Source: [employer] Discharge Report Date of Admission: 07/05/17 Diagnosis:acute encephalopathy, UTI Date of Discharge: 07/22/17 [facility]    Outreach attempt # 1 to patient. Number listed on file currently not in service.    Plan: RN CM will send unsuccessful outreach letter to patient. RN CM will make outreach attempt to patient within 3-4 business days.    Antionette Fairyoshanda Tkeyah Burkman, RN,BSN,CCM [facility] Care Management Telephonic Care Management Coordinator Direct Phone: [phone] Toll Free: [phone] Fax: [phone]

## 2017-07-26 ENCOUNTER — Telehealth: Payer: Self-pay | Admitting: Internal Medicine

## 2017-07-26 DIAGNOSIS — G301 Alzheimer's disease with late onset: Secondary | ICD-10-CM | POA: Diagnosis not present

## 2017-07-26 DIAGNOSIS — F332 Major depressive disorder, recurrent severe without psychotic features: Secondary | ICD-10-CM | POA: Diagnosis not present

## 2017-07-26 DIAGNOSIS — I482 Chronic atrial fibrillation: Secondary | ICD-10-CM | POA: Diagnosis not present

## 2017-07-26 DIAGNOSIS — I10 Essential (primary) hypertension: Secondary | ICD-10-CM | POA: Diagnosis not present

## 2017-07-26 DIAGNOSIS — F0281 Dementia in other diseases classified elsewhere with behavioral disturbance: Secondary | ICD-10-CM | POA: Diagnosis not present

## 2017-07-26 DIAGNOSIS — F411 Generalized anxiety disorder: Secondary | ICD-10-CM | POA: Diagnosis not present

## 2017-07-26 NOTE — Telephone Encounter (Signed)
[facility]  Calling   VO .  Please give them a call back.

## 2017-07-26 NOTE — Telephone Encounter (Signed)
[facility] to [facility] and once weekly for 4 weeks and HH Aide twice weekly for 8 weeks. Will route to PCP to see if we are in agreement.  April also thinks pt may qualify for Personal Care Services as she has dementia and needs assistance with bathing, dressing and feeding. Will route to [place] for assistance with this. Kinnie FeilL. Saje Gallop, RN, BSN

## 2017-07-27 ENCOUNTER — Other Ambulatory Visit: Payer: Self-pay

## 2017-07-27 DIAGNOSIS — F332 Major depressive disorder, recurrent severe without psychotic features: Secondary | ICD-10-CM | POA: Diagnosis not present

## 2017-07-27 DIAGNOSIS — F411 Generalized anxiety disorder: Secondary | ICD-10-CM | POA: Diagnosis not present

## 2017-07-27 DIAGNOSIS — I482 Chronic atrial fibrillation: Secondary | ICD-10-CM | POA: Diagnosis not present

## 2017-07-27 DIAGNOSIS — F0281 Dementia in other diseases classified elsewhere with behavioral disturbance: Secondary | ICD-10-CM | POA: Diagnosis not present

## 2017-07-27 DIAGNOSIS — I10 Essential (primary) hypertension: Secondary | ICD-10-CM | POA: Diagnosis not present

## 2017-07-27 DIAGNOSIS — G301 Alzheimer's disease with late onset: Secondary | ICD-10-CM | POA: Diagnosis not present

## 2017-07-27 NOTE — Patient Outreach (Signed)
[facility]) Care Management  07/27/2017  Lisa Haynes 06-07-1940 [id number]    Transition of Care Referral  Referral Date: 07/25/17 Referral Source: [employer] Discharge Report Date of Admission: 07/05/17 Diagnosis:acute encephalopathy, UTI Date of Discharge: 07/22/17 [facility]    Outreach attempt #2 to patient. Number remains disconnected.     Plan: RN CM has already sent unsuccessful outreach letter to patient. RN CM will make outreach attempt to patient within 3-4 business days.   [name], RN,BSN,CCM [facility] Care Management Telephonic Care Management Coordinator Direct Phone: [phone] Toll Free: [phone] Fax: [phone]

## 2017-07-28 ENCOUNTER — Telehealth: Payer: Self-pay | Admitting: Internal Medicine

## 2017-07-28 DIAGNOSIS — I10 Essential (primary) hypertension: Secondary | ICD-10-CM | POA: Diagnosis not present

## 2017-07-28 DIAGNOSIS — F332 Major depressive disorder, recurrent severe without psychotic features: Secondary | ICD-10-CM | POA: Diagnosis not present

## 2017-07-28 DIAGNOSIS — I482 Chronic atrial fibrillation: Secondary | ICD-10-CM | POA: Diagnosis not present

## 2017-07-28 DIAGNOSIS — F411 Generalized anxiety disorder: Secondary | ICD-10-CM | POA: Diagnosis not present

## 2017-07-28 DIAGNOSIS — F0281 Dementia in other diseases classified elsewhere with behavioral disturbance: Secondary | ICD-10-CM | POA: Diagnosis not present

## 2017-07-28 DIAGNOSIS — G301 Alzheimer's disease with late onset: Secondary | ICD-10-CM | POA: Diagnosis not present
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
## 2017-07-29 ENCOUNTER — Other Ambulatory Visit: Payer: Self-pay

## 2017-07-29 NOTE — Patient Outreach (Addendum)
[facility]) Care Management  07/29/2017  Lisa Haynes May 30, 1940 [id number]    Transition of Care Referral  Referral Date:07/25/17 Referral Source:[employer] Discharge Report Date of Admission:07/05/17 Diagnosis:acute encephalopathy, UTI Date of Discharge:07/22/17 Facility[facility] Insurance:[employer] Medicare   Outreach attempt #3 to patient. Number remains not in services. RN CM has already sent unsuccessful letter to patient.    Plan: RN CM will close case if no response from letter mailed to patient.    [name], RN,BSN,CCM [facility] Care Management Telephonic Care Management Coordinator Direct Phone: [phone] Toll Free: [phone] Fax: [phone]

## 2017-08-01 DIAGNOSIS — I10 Essential (primary) hypertension: Secondary | ICD-10-CM | POA: Diagnosis not present

## 2017-08-01 DIAGNOSIS — I482 Chronic atrial fibrillation: Secondary | ICD-10-CM | POA: Diagnosis not present

## 2017-08-01 DIAGNOSIS — F411 Generalized anxiety disorder: Secondary | ICD-10-CM | POA: Diagnosis not present

## 2017-08-01 DIAGNOSIS — F0281 Dementia in other diseases classified elsewhere with behavioral disturbance: Secondary | ICD-10-CM | POA: Diagnosis not present

## 2017-08-01 DIAGNOSIS — G301 Alzheimer's disease with late onset: Secondary | ICD-10-CM | POA: Diagnosis not present

## 2017-08-01 DIAGNOSIS — F332 Major depressive disorder, recurrent severe without psychotic features: Secondary | ICD-10-CM | POA: Diagnosis not present

## 2017-08-02 ENCOUNTER — Encounter: Payer: Medicare HMO | Admitting: Internal Medicine

## 2017-08-02 ENCOUNTER — Encounter: Payer: Self-pay | Admitting: Internal Medicine

## 2017-08-02 NOTE — Telephone Encounter (Signed)
F/u w/ April today, she will have her manager call and give imc an update
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
## 2017-08-05 ENCOUNTER — Other Ambulatory Visit: Payer: Self-pay

## 2017-08-05 NOTE — Telephone Encounter (Signed)
Thank you very much for the update!

## 2017-08-05 NOTE — Patient Outreach (Signed)
[facility]) Care Management  08/05/2017  Lucilla Edinatsy J Keeran 11-Sep-1940 [id number]     Transition of Care Referral  Referral Date:07/25/17 Referral Source:[employer] Discharge Report Date of Admission:07/05/17 Diagnosis:acute encephalopathy, UTI Date of Discharge:07/22/17 Facility[facility] Insurance:[employer] Medicare   Multiple attempts to establish contact with patient without success. No response from letter mailed to patient. Case is being closed at this time.     Plan: RN CM will close case at this time.   Antionette Fairyoshanda Eagle Pitta, RN,BSN,CCM [facility] Care Management Telephonic Care Management Coordinator Direct Phone: [phone] Toll Free: [phone] Fax: [phone]

## 2017-08-08 ENCOUNTER — Other Ambulatory Visit: Payer: Self-pay | Admitting: Internal Medicine

## 2017-08-13 DIAGNOSIS — R32 Unspecified urinary incontinence: Secondary | ICD-10-CM | POA: Diagnosis not present

## 2017-08-13 DIAGNOSIS — M818 Other osteoporosis without current pathological fracture: Secondary | ICD-10-CM | POA: Diagnosis not present

## 2017-08-13 DIAGNOSIS — R269 Unspecified abnormalities of gait and mobility: Secondary | ICD-10-CM | POA: Diagnosis not present

## 2017-08-13 DIAGNOSIS — R2681 Unsteadiness on feet: Secondary | ICD-10-CM | POA: Diagnosis not present

## 2017-08-13 DIAGNOSIS — H109 Unspecified conjunctivitis: Secondary | ICD-10-CM | POA: Diagnosis not present

## 2017-08-13 DIAGNOSIS — R260 Ataxic gait: Secondary | ICD-10-CM | POA: Diagnosis not present

## 2017-08-18 ENCOUNTER — Other Ambulatory Visit: Payer: Self-pay | Admitting: Internal Medicine

## 2017-08-22 NOTE — Telephone Encounter (Addendum)
Received refill request from pt's pharmacy for lisinopril and cholestyramine. Lisinopril refill was previously refused on 08/10/17 by pcp (see 04/08 phone note)  Per pharmacy-pt's last fill date was on 05/12/2017 for 3mth supply.  I will send request to pcp for review along with an appt request to front office as pt is overdue for a visit.  Please advise.Criss Alvine. Charm Stenner Cassady4/22/20192:13 PM

## 2017-08-23 NOTE — Addendum Note (Signed)
Addended by: Earl LagosBLUM, Simren Popson S on: 08/23/2017 09:26 AM   Modules accepted: Orders

## 2017-08-23 NOTE — Telephone Encounter (Addendum)
Call made to pharmacy to d/c lisinopril rx (signed in error). Call made to emergency contact (x2) -no answer, message left on recorder.Kingsley SpittleGoldston, Darlene Cassady4/23/201910:54 AM

## 2017-08-23 NOTE — Telephone Encounter (Signed)
Lisinopril was refused because Lisa Haynes is not supposed to be taking this medication. It was stopped in the setting of fall. Would you mind calling to make sure that she is not taking this medication? I have refilled the cholestyramine. Thank you

## 2017-08-29 ENCOUNTER — Telehealth: Payer: Self-pay | Admitting: *Deleted
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
## 2017-08-29 NOTE — Telephone Encounter (Signed)
I agree, thank you.

## 2017-08-30 ENCOUNTER — Encounter: Payer: Medicare HMO | Admitting: Internal Medicine

## 2017-09-02 DIAGNOSIS — I10 Essential (primary) hypertension: Secondary | ICD-10-CM | POA: Diagnosis not present

## 2017-09-02 DIAGNOSIS — F332 Major depressive disorder, recurrent severe without psychotic features: Secondary | ICD-10-CM | POA: Diagnosis not present

## 2017-09-02 DIAGNOSIS — F0281 Dementia in other diseases classified elsewhere with behavioral disturbance: Secondary | ICD-10-CM | POA: Diagnosis not present

## 2017-09-02 DIAGNOSIS — G301 Alzheimer's disease with late onset: Secondary | ICD-10-CM | POA: Diagnosis not present

## 2017-09-02 DIAGNOSIS — I482 Chronic atrial fibrillation: Secondary | ICD-10-CM | POA: Diagnosis not present

## 2017-09-02 DIAGNOSIS — F411 Generalized anxiety disorder: Secondary | ICD-10-CM | POA: Diagnosis not present

## 2017-09-05 ENCOUNTER — Telehealth: Payer: Self-pay | Admitting: Internal Medicine

## 2017-09-05 NOTE — Telephone Encounter (Signed)
I agree

## 2017-09-05 NOTE — Telephone Encounter (Signed)
Verbal auth given for [facility] PT twice weekly for 3 weeks. PCP unavailable at this time. Will forward to Attending Pool to see if we are in agreement. Kinnie Feil, RN, BSN

## 2017-09-05 NOTE — Telephone Encounter (Signed)
[facility] VO REQUEST FOR TWICE A WEEK FOR 3 WEEKS FOR HH PT.  Please call back.

## 2017-09-07 DIAGNOSIS — F0281 Dementia in other diseases classified elsewhere with behavioral disturbance: Secondary | ICD-10-CM | POA: Diagnosis not present

## 2017-09-07 DIAGNOSIS — I482 Chronic atrial fibrillation: Secondary | ICD-10-CM | POA: Diagnosis not present

## 2017-09-07 DIAGNOSIS — G301 Alzheimer's disease with late onset: Secondary | ICD-10-CM | POA: Diagnosis not present

## 2017-09-07 DIAGNOSIS — F411 Generalized anxiety disorder: Secondary | ICD-10-CM | POA: Diagnosis not present

## 2017-09-07 DIAGNOSIS — I10 Essential (primary) hypertension: Secondary | ICD-10-CM | POA: Diagnosis not present

## 2017-09-07 DIAGNOSIS — F332 Major depressive disorder, recurrent severe without psychotic features: Secondary | ICD-10-CM | POA: Diagnosis not present

## 2017-09-08 DIAGNOSIS — F332 Major depressive disorder, recurrent severe without psychotic features: Secondary | ICD-10-CM | POA: Diagnosis not present

## 2017-09-08 DIAGNOSIS — I482 Chronic atrial fibrillation: Secondary | ICD-10-CM | POA: Diagnosis not present

## 2017-09-08 DIAGNOSIS — G301 Alzheimer's disease with late onset: Secondary | ICD-10-CM | POA: Diagnosis not present

## 2017-09-08 DIAGNOSIS — F411 Generalized anxiety disorder: Secondary | ICD-10-CM | POA: Diagnosis not present

## 2017-09-08 DIAGNOSIS — I10 Essential (primary) hypertension: Secondary | ICD-10-CM | POA: Diagnosis not present

## 2017-09-08 DIAGNOSIS — F0281 Dementia in other diseases classified elsewhere with behavioral disturbance: Secondary | ICD-10-CM | POA: Diagnosis not present

## 2017-09-09 ENCOUNTER — Telehealth: Payer: Self-pay | Admitting: Internal Medicine

## 2017-09-09 NOTE — Telephone Encounter (Signed)
This is an FYI
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
## 2017-09-12 DIAGNOSIS — F0281 Dementia in other diseases classified elsewhere with behavioral disturbance: Secondary | ICD-10-CM | POA: Diagnosis not present

## 2017-09-12 DIAGNOSIS — F332 Major depressive disorder, recurrent severe without psychotic features: Secondary | ICD-10-CM | POA: Diagnosis not present

## 2017-09-12 DIAGNOSIS — R260 Ataxic gait: Secondary | ICD-10-CM | POA: Diagnosis not present

## 2017-09-12 DIAGNOSIS — R269 Unspecified abnormalities of gait and mobility: Secondary | ICD-10-CM | POA: Diagnosis not present

## 2017-09-12 DIAGNOSIS — M818 Other osteoporosis without current pathological fracture: Secondary | ICD-10-CM | POA: Diagnosis not present

## 2017-09-12 DIAGNOSIS — F411 Generalized anxiety disorder: Secondary | ICD-10-CM | POA: Diagnosis not present

## 2017-09-12 DIAGNOSIS — G301 Alzheimer's disease with late onset: Secondary | ICD-10-CM | POA: Diagnosis not present

## 2017-09-12 DIAGNOSIS — H109 Unspecified conjunctivitis: Secondary | ICD-10-CM | POA: Diagnosis not present

## 2017-09-12 DIAGNOSIS — R32 Unspecified urinary incontinence: Secondary | ICD-10-CM | POA: Diagnosis not present

## 2017-09-12 DIAGNOSIS — I482 Chronic atrial fibrillation: Secondary | ICD-10-CM | POA: Diagnosis not present

## 2017-09-12 DIAGNOSIS — I10 Essential (primary) hypertension: Secondary | ICD-10-CM | POA: Diagnosis not present

## 2017-09-12 DIAGNOSIS — R2681 Unsteadiness on feet: Secondary | ICD-10-CM | POA: Diagnosis not present

## 2017-09-14 DIAGNOSIS — F332 Major depressive disorder, recurrent severe without psychotic features: Secondary | ICD-10-CM | POA: Diagnosis not present

## 2017-09-14 DIAGNOSIS — F0281 Dementia in other diseases classified elsewhere with behavioral disturbance: Secondary | ICD-10-CM | POA: Diagnosis not present

## 2017-09-14 DIAGNOSIS — F411 Generalized anxiety disorder: Secondary | ICD-10-CM | POA: Diagnosis not present

## 2017-09-14 DIAGNOSIS — I10 Essential (primary) hypertension: Secondary | ICD-10-CM | POA: Diagnosis not present

## 2017-09-14 DIAGNOSIS — G301 Alzheimer's disease with late onset: Secondary | ICD-10-CM | POA: Diagnosis not present

## 2017-09-14 DIAGNOSIS — I482 Chronic atrial fibrillation: Secondary | ICD-10-CM | POA: Diagnosis not present

## 2017-09-16 NOTE — Telephone Encounter (Signed)
Thank you for the update. I'll continue to follow along.

## 2017-09-19 DIAGNOSIS — F332 Major depressive disorder, recurrent severe without psychotic features: Secondary | ICD-10-CM | POA: Diagnosis not present

## 2017-09-19 DIAGNOSIS — F411 Generalized anxiety disorder: Secondary | ICD-10-CM | POA: Diagnosis not present

## 2017-09-19 DIAGNOSIS — G301 Alzheimer's disease with late onset: Secondary | ICD-10-CM | POA: Diagnosis not present

## 2017-09-19 DIAGNOSIS — F0281 Dementia in other diseases classified elsewhere with behavioral disturbance: Secondary | ICD-10-CM | POA: Diagnosis not present

## 2017-09-19 DIAGNOSIS — I482 Chronic atrial fibrillation: Secondary | ICD-10-CM | POA: Diagnosis not present

## 2017-09-19 DIAGNOSIS — I10 Essential (primary) hypertension: Secondary | ICD-10-CM | POA: Diagnosis not present

## 2017-09-20 DIAGNOSIS — F332 Major depressive disorder, recurrent severe without psychotic features: Secondary | ICD-10-CM | POA: Diagnosis not present

## 2017-09-20 DIAGNOSIS — I482 Chronic atrial fibrillation: Secondary | ICD-10-CM | POA: Diagnosis not present

## 2017-09-20 DIAGNOSIS — G301 Alzheimer's disease with late onset: Secondary | ICD-10-CM | POA: Diagnosis not present

## 2017-09-20 DIAGNOSIS — I10 Essential (primary) hypertension: Secondary | ICD-10-CM | POA: Diagnosis not present

## 2017-09-20 DIAGNOSIS — F411 Generalized anxiety disorder: Secondary | ICD-10-CM | POA: Diagnosis not present

## 2017-09-20 DIAGNOSIS — F0281 Dementia in other diseases classified elsewhere with behavioral disturbance: Secondary | ICD-10-CM | POA: Diagnosis not present

## 2017-09-22 DIAGNOSIS — I10 Essential (primary) hypertension: Secondary | ICD-10-CM | POA: Diagnosis not present

## 2017-09-22 DIAGNOSIS — I482 Chronic atrial fibrillation: Secondary | ICD-10-CM | POA: Diagnosis not present

## 2017-09-22 DIAGNOSIS — F411 Generalized anxiety disorder: Secondary | ICD-10-CM | POA: Diagnosis not present

## 2017-09-22 DIAGNOSIS — F332 Major depressive disorder, recurrent severe without psychotic features: Secondary | ICD-10-CM | POA: Diagnosis not present

## 2017-09-22 DIAGNOSIS — G301 Alzheimer's disease with late onset: Secondary | ICD-10-CM | POA: Diagnosis not present

## 2017-09-22 DIAGNOSIS — F0281 Dementia in other diseases classified elsewhere with behavioral disturbance: Secondary | ICD-10-CM | POA: Diagnosis not present

## 2017-10-04 ENCOUNTER — Encounter (HOSPITAL_COMMUNITY): Payer: Self-pay

## 2017-10-04 ENCOUNTER — Emergency Department (HOSPITAL_COMMUNITY): Payer: Medicare HMO

## 2017-10-04 ENCOUNTER — Other Ambulatory Visit: Payer: Self-pay

## 2017-10-04 ENCOUNTER — Emergency Department (HOSPITAL_COMMUNITY)
Admission: EM | Admit: 2017-10-04 | Discharge: 2017-10-04 | Disposition: A | Discharge status: Home / Self Care | Payer: Medicare HMO | Attending: Emergency Medicine | Admitting: Emergency Medicine

## 2017-10-04 ENCOUNTER — Telehealth: Payer: Self-pay | Admitting: *Deleted

## 2017-10-04 DIAGNOSIS — F0280 Dementia in other diseases classified elsewhere without behavioral disturbance: Secondary | ICD-10-CM | POA: Diagnosis not present

## 2017-10-04 DIAGNOSIS — M545 Low back pain, unspecified: Secondary | ICD-10-CM

## 2017-10-04 DIAGNOSIS — I10 Essential (primary) hypertension: Secondary | ICD-10-CM | POA: Diagnosis not present

## 2017-10-04 DIAGNOSIS — R52 Pain, unspecified: Secondary | ICD-10-CM | POA: Diagnosis not present

## 2017-10-04 DIAGNOSIS — N3001 Acute cystitis with hematuria: Secondary | ICD-10-CM | POA: Diagnosis not present

## 2017-10-04 DIAGNOSIS — M5489 Other dorsalgia: Secondary | ICD-10-CM | POA: Diagnosis not present

## 2017-10-04 DIAGNOSIS — S3992XA Unspecified injury of lower back, initial encounter: Secondary | ICD-10-CM | POA: Diagnosis not present

## 2017-10-04 DIAGNOSIS — Z79899 Other long term (current) drug therapy: Secondary | ICD-10-CM | POA: Diagnosis not present

## 2017-10-04 DIAGNOSIS — G309 Alzheimer's disease, unspecified: Secondary | ICD-10-CM | POA: Diagnosis not present

## 2017-10-04 DIAGNOSIS — Z7901 Long term (current) use of anticoagulants: Secondary | ICD-10-CM | POA: Insufficient documentation

## 2017-10-04 DIAGNOSIS — I480 Paroxysmal atrial fibrillation: Secondary | ICD-10-CM | POA: Diagnosis not present

## 2017-10-04 LAB — CBC WITH DIFFERENTIAL/PLATELET
Abs Immature Granulocytes: 0.1 10*3/uL (ref 0.0–0.1)
Basophils Absolute: 0.1 10*3/uL (ref 0.0–0.1)
Basophils Relative: 1 %
EOS ABS: 0.1 10*3/uL (ref 0.0–0.7)
EOS PCT: 1 %
HEMATOCRIT: 40.3 % (ref 36.0–46.0)
HEMOGLOBIN: 12.6 g/dL (ref 12.0–15.0)
Immature Granulocytes: 1 %
LYMPHS ABS: 3.3 10*3/uL (ref 0.7–4.0)
LYMPHS PCT: 32 %
MCH: 29.2 pg (ref 26.0–34.0)
MCHC: 31.3 g/dL (ref 30.0–36.0)
MCV: 93.3 fL (ref 78.0–100.0)
MONOS PCT: 9 %
Monocytes Absolute: 0.9 10*3/uL (ref 0.1–1.0)
Neutro Abs: 6 10*3/uL (ref 1.7–7.7)
Neutrophils Relative %: 58 %
Platelets: 181 10*3/uL (ref 150–400)
RBC: 4.32 MIL/uL (ref 3.87–5.11)
RDW: 13.5 % (ref 11.5–15.5)
WBC: 10.4 10*3/uL (ref 4.0–10.5)

## 2017-10-04 LAB — URINALYSIS, ROUTINE W REFLEX MICROSCOPIC
Bilirubin Urine: NEGATIVE
GLUCOSE, UA: NEGATIVE mg/dL
Ketones, ur: NEGATIVE mg/dL
Nitrite: POSITIVE — AB
PH: 5 (ref 5.0–8.0)
Protein, ur: NEGATIVE mg/dL
SPECIFIC GRAVITY, URINE: 1.017 (ref 1.005–1.030)

## 2017-10-04 LAB — COMPREHENSIVE METABOLIC PANEL
ALBUMIN: 3.6 g/dL (ref 3.5–5.0)
ALK PHOS: 53 U/L (ref 38–126)
ALT: 10 U/L — ABNORMAL LOW (ref 14–54)
ANION GAP: 8 (ref 5–15)
AST: 17 U/L (ref 15–41)
BILIRUBIN TOTAL: 0.8 mg/dL (ref 0.3–1.2)
BUN: 18 mg/dL (ref 6–20)
CALCIUM: 9.3 mg/dL (ref 8.9–10.3)
CO2: 23 mmol/L (ref 22–32)
Chloride: 109 mmol/L (ref 101–111)
Creatinine, Ser: 1.08 mg/dL — ABNORMAL HIGH (ref 0.44–1.00)
GFR calc Af Amer: 55 mL/min — ABNORMAL LOW (ref >60)
GFR calc non Af Amer: 48 mL/min — ABNORMAL LOW (ref >60)
GLUCOSE: 106 mg/dL — AB (ref 65–99)
POTASSIUM: 3.6 mmol/L (ref 3.5–5.1)
Sodium: 140 mmol/L (ref 135–145)
TOTAL PROTEIN: 6.6 g/dL (ref 6.5–8.1)

## 2017-10-04 MED ORDER — CEPHALEXIN 500 MG PO CAPS: 500.0000 mg | ORAL_CAPSULE | Freq: Three times a day (TID) | ORAL | 0 refills | Status: AC

## 2017-10-04 MED ORDER — ACETAMINOPHEN 500 MG PO TABS
1000.0000 mg | ORAL_TABLET | Freq: Once | ORAL | Status: AC
  Administered 2017-10-04: 1000 mg via ORAL
  Filled 2017-10-04: qty 2

## 2017-10-04 MED ORDER — SODIUM CHLORIDE 0.9 % IV SOLN
1.0000 g | Freq: Once | INTRAVENOUS | Status: AC
  Administered 2017-10-04: 1 g via INTRAVENOUS
  Filled 2017-10-04: qty 10

## 2017-10-04 NOTE — ED Notes (Signed)
Pt denies complaint and refuses further food.

## 2017-10-04 NOTE — ED Notes (Signed)
Lisa Haynes CSW asked for assist with finding family for pt.

## 2017-10-04 NOTE — ED Notes (Signed)
Attempting to call contact numbers oin pt chart. Home phone disconnected and message left with niece number.

## 2017-10-04 NOTE — ED Notes (Signed)
Pt cleaned of urine and stool and placed in clean gown.some redness noted at bottom. Sent home with fresh breif and 2 extra.dc instructions discussed with pt and son. Printed intructions and perscrition given to son.

## 2017-10-04 NOTE — ED Notes (Signed)
Pt given HH breakfast tray and set up for eating. States she can feed self after assist offered.
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
## 2017-10-04 NOTE — ED Notes (Signed)
Family at bedsdside.
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
## 2017-10-04 NOTE — ED Notes (Signed)
Pt is alert and denies pain or shortness of breath. Pt is alert to self but not place time or siuation. MAEW resp even and non labored. Skin warm abnd dry with peripheral pulses x 4.

## 2017-10-04 NOTE — ED Triage Notes (Signed)
Patient from home with complaints of lower back pain.  Hx of dementia and HTN.  BP 157/99.  Larey SeatFell last week but no signs of trauma now.  No abdominal masses of palpations.

## 2017-10-04 NOTE — ED Notes (Signed)
Heart healthy breakfast tray [facility]

## 2017-10-04 NOTE — Discharge Planning (Signed)
Almando Brawley J. Lucretia RoersWood, RN, BSN, [employer] [phone] Spoke with pt at bedside regarding [facility] planning [facility]. Offered pt list of home health agencies to choose from.  Pt chose [facility] to render services. Eugenio Ho[facility]llen [facility] notified[facility] Patient made aware that [facility] will be in contact today.  No DME needs identified at this time.

## 2017-10-04 NOTE — ED Notes (Signed)
Pt moved to hallway per charge.

## 2017-10-04 NOTE — ED Provider Notes (Signed)
[facility] EMERGENCY DEPARTMENT Provider Note  CSN: [id number] Arrival date & time: 10/04/17 0057  Chief Complaint(s) Back Pain  HPI Lisa Haynes is a 77 y.o. female with a history of dementia presents to the emergency department with lower back pain.  Per EMS, family called out due to the back pain.  They stated that it started today.  Not associated with acute trauma however family did report that the patient fell 1 week ago but did not have any associated pain with it.  Remainder of history, ROS, and physical exam limited due to patient's condition (dementia). Additional information was obtained from EMS.   Level V Caveat.  Attempted to call family but unsuccessful.  HPI  Past Medical History Past Medical History:  Diagnosis Date  . Depression   . GERD (gastroesophageal reflux disease)   . High cholesterol   . Hypertension   . Osteoarthritis    "legs" (11/11/2015)  . Osteoporosis   . Paroxysmal atrial fibrillation (HCC) 08/20/2014  . Urinary incontinence    Patient Active Problem List   Diagnosis Date Noted  . Acute cystitis without hematuria   . Altered mental status   . Chronic atrial fibrillation (HCC)   . 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 Dehydration   . Late onset Alzheimer's disease without behavioral disturbance   . Acute renal injury (HCC) 07/01/2017  . E. coli UTI (urinary tract infection) 06/30/2017  . Cough 12/28/2016  . Black stool 12/28/2016  . Pressure injury of skin 11/20/2016  . Inferior pubic ramus fracture (HCC) 11/19/2016  . Hallucinations 09/12/2016  . Unsteady gait 05/29/2016  . Atrial flutter (HCC) 11/20/2015  . 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 Paroxysmal atrial fibrillation (HCC) 09/25/2015  . Diverticulosis 06/20/2014  . Preventative health care 10/15/2010  . Hyperlipidemia 01/29/2009  . GERD 04/15/2008  . Depression with anxiety 09/01/2007  . Essential hypertension 07/13/2006  .  Osteoporosis 07/13/2006   Home Medication(s) Prior to Admission medications   Medication Sig Start Date End Date Taking? Authorizing Provider  alendronate (FOSAMAX) 70 MG tablet Take 1 tablet (70 mg total) by mouth every 7 (seven) days. Take with a full glass of water on an empty stomach. 09/24/16   Thomasene Lot, MD  apixaban (ELIQUIS) 5 MG TABS tablet Take 1 tablet (5 mg total) by mouth 2 (two) times daily. 04/08/17   Rozann Lesches, MD  Calcium Carbonate-Vitamin D (CALCARB 600/D) 600-400 MG-UNIT tablet Take 1 tablet by mouth 2 (two) times daily with a meal. 04/08/17   Nedrud, Jeanella Flattery, MD  cephALEXin (KEFLEX) 500 MG capsule Take 1 capsule (500 mg total) by mouth 3 (three) times daily for 7 days. 10/04/17 10/11/17  Nira Conn, MD  cholestyramine Lanetta Inch) 4 g packet MIX AND TAKE ONE PACKET BY MOUTH AS NEEDED 08/23/17   Eulah Pont, MD  metoprolol tartrate (LOPRESSOR) 25 MG tablet Take 0.5 tablets (12.5 mg total) by mouth 2 (two) times daily. 04/08/17 07/07/17  Rozann Lesches, MD  Multiple Vitamins-Minerals (MULTIVITAMIN WITH MINERALS) tablet Take 1 tablet by mouth daily.    [provider]  nitroGLYCERIN (NITROSTAT) 0.4 MG SL tablet DISSOLVE 1 TABLET UNDER THE TONGUE EVERY 5 MINUTES AS NEEDED FOR CHEST PAIN 12/14/16   Gust Rung, DO  sertraline (ZOLOFT) 50 MG tablet TAKE 1 TABLET BY MOUTH AT BEDTIME Patient taking differently: TAKE 1 TABLET (50mg ) BY MOUTH AT BEDTIME 05/12/17   Eulah Pont, MD  simvastatin (ZOCOR) 10 MG tablet Take 1 tablet (10  mg total) by mouth daily. 04/08/17   Rozann Lesches, MD                                                                                                                                    Past Surgical History Past Surgical History:  Procedure Laterality Date  . APPENDECTOMY    . CHOLECYSTECTOMY N/A 10/01/2014   Procedure: LAPAROSCOPIC CHOLECYSTECTOMY ;  Surgeon: Harriette Bouillon, MD;  Location: [facility] OR;  Service: General;  Laterality: N/A;   . DILATION AND CURETTAGE OF UTERUS     "after I had my boys"  . ERCP N/A 09/27/2014   Procedure: ENDOSCOPIC RETROGRADE CHOLANGIOPANCREATOGRAPHY (ERCP);  Surgeon: Jeani Hawking, MD;  Location: [facility] ENDOSCOPY;  Service: Endoscopy;  Laterality: N/A;  . VAGINAL HYSTERECTOMY     with unilateral oophorectomy   Family History Family History  Problem Relation Age of Onset  . Heart disease Mother        onset 85s  . Heart disease Brother 29    Social History Social History   Tobacco Use  . Smoking status: Never Smoker  . Smokeless tobacco: Never Used  Substance Use Topics  . Alcohol use: No    Alcohol/week: 0.0 oz  . Drug use: No   Allergies Morphine and related  Review of Systems Review of Systems  Unable to perform ROS: Dementia    Physical Exam Vital Signs  I have reviewed the triage vital signs BP (!) 149/81   Pulse 60   SpO2 99%   Physical Exam  Constitutional: She appears well-developed and well-nourished. No distress.  HENT:  Head: Normocephalic and atraumatic.  Nose: Nose normal.  Eyes: Pupils are equal, round, and reactive to light. Conjunctivae and EOM are normal. Right eye exhibits no discharge. Left eye exhibits no discharge. No scleral icterus.  Neck: Normal range of motion. Neck supple.  Cardiovascular: Normal rate and regular rhythm. Exam reveals no gallop and no friction rub.  No murmur heard. Pulmonary/Chest: Effort normal and breath sounds normal. No stridor. No respiratory distress. She has no rales.  Abdominal: Soft. She exhibits no distension. There is no tenderness. There is no rigidity, no rebound and no guarding.  Musculoskeletal: She exhibits no edema.       Lumbar back: She exhibits tenderness. She exhibits no bony tenderness.       Back:  Neurological: She is alert. She is disoriented (oriented to self and place).  Spine Exam: Strength: 5/5 throughout LE bilaterally (hip flexion/extension, adduction/abduction; knee flexion/extension; foot  dorsiflexion/plantarflexion, inversion/eversion; great toe inversion) Sensation: Intact to light touch in proximal and distal LE bilaterally Reflexes: 1+ quadriceps and achilles reflexes   Skin: Skin is warm and dry. No rash noted. She is not diaphoretic. No erythema.  Psychiatric: She has a normal mood and affect.  Vitals reviewed.   ED Results and Treatments Labs (all labs ordered are listed, but only abnormal results are displayed) Labs  Reviewed  COMPREHENSIVE METABOLIC PANEL - Abnormal; Notable for the following components:      Result Value   Glucose, Bld 106 (*)    Creatinine, Ser 1.08 (*)    ALT 10 (*)    GFR calc non Af Amer 48 (*)    GFR calc Af Amer 55 (*)    All other components within normal limits  URINALYSIS, ROUTINE W REFLEX MICROSCOPIC - Abnormal; Notable for the following components:   Hgb urine dipstick SMALL (*)    Nitrite POSITIVE (*)    Leukocytes, UA TRACE (*)    Bacteria, UA MANY (*)    All other components within normal limits  CBC WITH DIFFERENTIAL/PLATELET                                                                                                                         EKG  EKG Interpretation  Date/Time:    Ventricular Rate:    PR Interval:    QRS Duration:   QT Interval:    QTC Calculation:   R Axis:     Text Interpretation:        Radiology Ct Lumbar Spine Wo Contrast  Result Date: 10/04/2017 CLINICAL DATA:  Back pain, fell last week.  History of osteoporosis. EXAM: CT LUMBAR SPINE WITHOUT CONTRAST TECHNIQUE: Multidetector CT imaging of the lumbar spine was performed without intravenous contrast administration. Multiplanar CT image reconstructions were also generated. COMPARISON:  CT abdomen pelvis March 06, 2016 FINDINGS: SEGMENTATION: For the purposes of this report the last well-formed intervertebral disc space is reported as L5-S1. ALIGNMENT: Maintained lumbar lordosis. Minimal L3-4 anterolisthesis. No spondylolysis. VERTEBRAE:  Linear vertical density right sacrum (zone 1). Lumbar vertebral bodies and posterior elements are intact. Moderate to severe T12 disc height loss with vacuum disc and endplate spurring. Osteopenia. No destructive bony lesions. PARASPINAL AND OTHER SOFT TISSUES: Nonacute. Mild calcific atherosclerosis. Status post cholecystectomy. DISC LEVELS: T12-L1 through L2-3: No disc bulge, canal stenosis nor neural foraminal narrowing. L3-4: Anterolisthesis. Annular bulging. Moderate facet arthropathy. No canal stenosis or neural foraminal narrowing. L4-5: Small broad-based disc bulge. Mild-to-moderate facet arthropathy and ligamentum flavum redundancy without canal stenosis. Mild right neural foraminal narrowing. L5-S1: No disc bulge. Moderate to severe facet arthropathy. No canal stenosis or neural foraminal narrowing. IMPRESSION: 1. Age-indeterminate right sacral fracture. 2. No lumbar spine fracture. Minimal grade 1 L3-4 anterolisthesis on a degenerative basis. 3. No canal stenosis.  Mild right L4-5 neural foraminal narrowing. Electronically Signed   By: Awilda Metro M.D.   On: 10/04/2017 04:23   Pertinent labs & imaging results that were available during my care of the patient were reviewed by me and considered in my medical decision making (see chart for details).  Medications Ordered in ED Medications  acetaminophen (TYLENOL) tablet 1,000 mg (1,000 mg Oral Given 10/04/17 0233)  cefTRIAXone (ROCEPHIN) 1 g in sodium chloride 0.9 % 100 mL IVPB (0 g Intravenous Stopped 10/04/17 0637)  Procedures Procedures  (including critical care time)  Medical Decision Making / ED Course I have reviewed the nursing notes for this encounter and the patient's prior records (if available in EHR or on provided paperwork).    Lower back pain.  Neurologically intact in lower extremities.  Doubt  cauda equina.  Given fall 1 week ago, CT scan obtained which did not reveal acute fracture.  They reveal age-indeterminate sacral fracture which does not clinically correlate with her symptoms today.  Screening labs grossly reassuring with no significant electrolyte derangements or renal insufficiency.  No leukocytosis.  UA obtained which was concerning for urinary tract infection.  She was treated with IV Rocephin.  I again attempted to contact family to update them but was unsuccessful.  We will discharge patient home with antibiotics.  Recommend close PCP follow-up.  The patient appears reasonably screened and/or stabilized for discharge and I doubt any other medical condition or other [facility] requiring further screening, evaluation, or treatment in the ED at this time prior to discharge.  The patient is safe for discharge with strict return precautions.   Final Clinical Impression(s) / ED Diagnoses Final diagnoses:  Bilateral low back pain without sciatica, unspecified chronicity  Acute cystitis with hematuria    Disposition: Discharge  Condition: Good   ED Discharge Orders        Ordered    cephALEXin (KEFLEX) 500 MG capsule  3 times daily     10/04/17 16100717       Follow Up: Primary care provider  Schedule an appointment as soon as possible for a visit in 1 week For close follow up to assess for appropriate treatment of urinary tract infection     This chart was dictated using voice recognition software.  Despite best efforts to proofread,  errors can occur which can change the documentation meaning.   Nira Connardama, Pedro Eduardo, MD 10/04/17 [phone]

## 2017-10-04 NOTE — ED Notes (Signed)
Phone number of son who called EMS: [phone]

## 2017-10-04 NOTE — Progress Notes (Signed)
Pt's son Reuel BoomDaniel arrived to pick pt up from the ED. CSW was informed that pt's son wanted to speak with CSW. CSW spoke with son and was informed that he is needing help with getting pt briefs to wear at home. CSW [place] help with this. Pt's son asked CSW about Medicaid and CSW expressed that [place] [facility] [place].   Plan is for pt to be discharged home with son where CSW will asked that RNCM see if pt is eligible for Home Health services. AT this time there are no further CSW needs. CSW will sign off.    Claude MangesKierra S. Leda Bellefeuille, MSW, LCSW-A Emergency Department Clinical Social Worker [phone]

## 2017-10-13 DIAGNOSIS — R32 Unspecified urinary incontinence: Secondary | ICD-10-CM | POA: Diagnosis not present

## 2017-10-13 DIAGNOSIS — H109 Unspecified conjunctivitis: Secondary | ICD-10-CM | POA: Diagnosis not present

## 2017-10-13 DIAGNOSIS — R260 Ataxic gait: Secondary | ICD-10-CM | POA: Diagnosis not present

## 2017-10-13 DIAGNOSIS — R2681 Unsteadiness on feet: Secondary | ICD-10-CM | POA: Diagnosis not present

## 2017-10-13 DIAGNOSIS — R269 Unspecified abnormalities of gait and mobility: Secondary | ICD-10-CM | POA: Diagnosis not present

## 2017-10-13 DIAGNOSIS — M818 Other osteoporosis without current pathological fracture: Secondary | ICD-10-CM | POA: Diagnosis not present

## 2017-10-18 ENCOUNTER — Ambulatory Visit (INDEPENDENT_AMBULATORY_CARE_PROVIDER_SITE_OTHER): Payer: Medicare HMO | Admitting: Internal Medicine

## 2017-10-18 ENCOUNTER — Other Ambulatory Visit: Payer: Self-pay

## 2017-10-18 ENCOUNTER — Encounter: Payer: Self-pay | Admitting: Internal Medicine

## 2017-10-18 ENCOUNTER — Ambulatory Visit (HOSPITAL_COMMUNITY)
Admission: RE | Admit: 2017-10-18 | Discharge: 2017-10-18 | Disposition: A | Discharge status: Home / Self Care | Payer: Medicare HMO | Source: Ambulatory Visit | Attending: Internal Medicine | Admitting: Internal Medicine

## 2017-10-18 VITALS — BP 123/63 | HR 70 | Temp 97.8°F | Ht 61.0 in | Wt 178.7 lb

## 2017-10-18 DIAGNOSIS — Z742 Need for assistance at home and no other household member able to render care: Secondary | ICD-10-CM

## 2017-10-18 DIAGNOSIS — M79605 Pain in left leg: Secondary | ICD-10-CM

## 2017-10-18 DIAGNOSIS — Z79899 Other long term (current) drug therapy: Secondary | ICD-10-CM | POA: Diagnosis not present

## 2017-10-18 DIAGNOSIS — Z7983 Long term (current) use of bisphosphonates: Secondary | ICD-10-CM

## 2017-10-18 DIAGNOSIS — Z9181 History of falling: Secondary | ICD-10-CM | POA: Diagnosis not present

## 2017-10-18 DIAGNOSIS — R441 Visual hallucinations: Secondary | ICD-10-CM | POA: Diagnosis not present

## 2017-10-18 DIAGNOSIS — M810 Age-related osteoporosis without current pathological fracture: Secondary | ICD-10-CM | POA: Diagnosis not present

## 2017-10-18 DIAGNOSIS — I480 Paroxysmal atrial fibrillation: Secondary | ICD-10-CM

## 2017-10-18 DIAGNOSIS — F418 Other specified anxiety disorders: Secondary | ICD-10-CM

## 2017-10-18 DIAGNOSIS — F0280 Dementia in other diseases classified elsewhere without behavioral disturbance: Secondary | ICD-10-CM

## 2017-10-18 DIAGNOSIS — Z87310 Personal history of (healed) osteoporosis fracture: Secondary | ICD-10-CM

## 2017-10-18 DIAGNOSIS — F0282 Dementia in other diseases classified elsewhere, unspecified severity, with psychotic disturbance: Secondary | ICD-10-CM

## 2017-10-18 DIAGNOSIS — I10 Essential (primary) hypertension: Secondary | ICD-10-CM

## 2017-10-18 DIAGNOSIS — R69 Illness, unspecified: Secondary | ICD-10-CM | POA: Diagnosis not present

## 2017-10-18 DIAGNOSIS — F05 Delirium due to known physiological condition: Principal | ICD-10-CM

## 2017-10-18 DIAGNOSIS — Z7901 Long term (current) use of anticoagulants: Secondary | ICD-10-CM | POA: Diagnosis not present

## 2017-10-18 DIAGNOSIS — M8000XD Age-related osteoporosis with current pathological fracture, unspecified site, subsequent encounter for fracture with routine healing: Secondary | ICD-10-CM

## 2017-10-18 DIAGNOSIS — G309 Alzheimer's disease, unspecified: Principal | ICD-10-CM

## 2017-10-18 DIAGNOSIS — I4892 Unspecified atrial flutter: Secondary | ICD-10-CM

## 2017-10-18 MED ORDER — APIXABAN 5 MG PO TABS: 5.0000 mg | ORAL_TABLET | Freq: Two times a day (BID) | ORAL | 3 refills | Status: AC

## 2017-10-18 MED ORDER — SIMVASTATIN 10 MG PO TABS: 10.0000 mg | ORAL_TABLET | Freq: Every day | ORAL | 11 refills | Status: AC

## 2017-10-18 NOTE — Progress Notes (Signed)
CC: follow up of hypertension   HPI:  Lisa Haynes is a 77 y.o. with PMH paroxysmal atrial fibrillation/ atrial flutter, anxiety, depression, hypertension, osteoporosis who presents for follow up of hypertension. Please see the assessment and plans for the status of the patient chronic medical problems.   Past Medical History:  Diagnosis Date  . Depression   . GERD (gastroesophageal reflux disease)   . High cholesterol   . Hypertension   . Osteoarthritis    "legs" (11/11/2015)  . Osteoporosis   . Paroxysmal atrial fibrillation (HCC) 08/20/2014  . Urinary incontinence    Review of Systems:  Refer to history of present illness and assessment and plans for pertinent review of systems, all others reviewed and negative  Physical Exam:  Vitals:   10/18/17 1400  BP: 123/63  Pulse: 70  Temp: 97.8 F (36.6 C)  TempSrc: Oral  SpO2: 99%  Weight: 178 lb 11.2 oz (81.1 kg)  Height: 5\' 1"  (1.549 m)   General: no acute distress  Cardiac: regular rate and rhythm, normal S1 and S2, no murmur appreciated  Pulm: normal work of breathing, lungs clear to auscultation Neuro: laughing nervously, word finding difficulties, not oriented to time or place, not showing signs of hallucination or response to external stimulus Extremity: There are varicosities over bilateral lower extremities, the left calf appears larger than the right and is tender to palpation over the posterior aspect with 1+ pitting edema of the left leg      Assessment & Plan:   Left leg pain Lisa Haynes has a difficult time describing how long she has been having left leg pain.  She feels that the pain is not particularly worse with walking.  She is pretty sedentary from day today.  He has a history of frequent falls and although her son reports that these falls have decreased in frequency she was found to have fallen outside of her bed 2 weeks ago.  Exam she has tenderness to palpation of bilateral anterior tibia and the  left calf, there is a visible difference in calf size between the right and left calf and 1+ pitting edema in the left lower extremity. -Well score for DVT is consistent with at least moderate risk for DVT, screening left lower extremity Doppler did not show signs of DVT -3 of frequent falls and known osteoporosis puts her at risk for fracture, will obtain a tibia-fibula x-ray  Hallucinations Depression She feels that her symptoms are well controlled today, PHQ to screen screening is zero.  She does need to experience visual hallucinations.  Her son is open to bringing her to geriatrician psychiatrist for further assessment. - Refer to geripsych  -Continue sertraline 50 mg qd   Hypertension  BP Readings from Last 3 Encounters:  10/18/17 123/63  10/04/17 (!) 151/74  07/05/17 (!) 148/42  Blood pressure is well controlled today.  - continue metoprolol tartrate 12.5 BID   Osteoporosis   Fosamax was started in April 2017, she has had a fragility inferior pubic ramus fracture and bone density in 2006 showed osteopenia. She will need to go on a holiday in April 2022.  - continue fosamax 70 mg weekly  Paroxysmal atrial fibrillation  Normal sinus rhythm on exam today  -Continue metoprolol tartrate 12.5 mg BID for rate control -continue Eliquis 5 mg qd for stroke prophylaxis  Difficult social situation Lisa Haynes has a significant history of falls and difficulty caring for herself.  Her son works a regular job and  she is at home by herself throughout the day.  She needs significant help with ADLs including cooking for herself, bathing, cleaning, paying bills, picking up groceries.  Her son expresses that this has been very difficult on him considering he is trying to hold a full-time job.  There is not much other social support available, another son is around but visits about twice a year.  He is interested in adult daycare, we discussed that [facility] would be a very great option for them he is open  to visiting and applying further program. Referral paperwork will be sent to [facility], brochure provided to Ms. Eckerson son [name] today   See Encounters Tab for problem based charting.  Patient discussed with [name]

## 2017-10-18 NOTE — Progress Notes (Addendum)
LLE venous duplex prelim: negative for DVT. Farrel DemarkJill Eunice, RDMS, RVT  Attempted to page Dr. Obie DredgeBlum at 3:54 pm to give results. No return call as of 4:00 pm. Will let patient leave.

## 2017-10-18 NOTE — Patient Instructions (Signed)
Thank you for coming to the clinic today. It was a pleasure to see you.   For your mood, Please make an appointment to see Dr. Donell BeersPlovsky the geriatrician psychiatrist   For your leg pain, I have ordered an xray and ultrasound of your leg.   Please review this information about [facility]   FOLLOW-UP INSTRUCTIONS When: 1 year with Dr. Obie DredgeBlum   For: follow up of your blood pressure and heart rate  What to bring: all of your medication bottles   Please call our clinic if you have any questions or concerns, we may be able to help and keep you from a long and expensive emergency room wait. Our clinic and after hours phone number is [phone], the best time to call is Monday through Friday 9 am to 4 pm but there is always someone available 24/7 if you have an emergency. If you need medication refills please notify your pharmacy one week in advance and they will send us a request.

## 2017-10-19 ENCOUNTER — Encounter: Payer: Self-pay | Admitting: Internal Medicine

## 2017-10-19 DIAGNOSIS — M79606 Pain in leg, unspecified: Secondary | ICD-10-CM | POA: Insufficient documentation

## 2017-10-19 NOTE — Assessment & Plan Note (Signed)
Fosamax was started in April 2017, she has had a fragility inferior pubic ramus fracture and bone density in 2006 showed osteopenia. She will need to go on a holiday in April 2022.  - continue fosamax 70 mg weekly

## 2017-10-19 NOTE — Assessment & Plan Note (Signed)
[name] has a difficult time describing how long she has been having left lower leg pain.  She feels that the pain is not particularly worse with walking.  She is pretty sedentary from day today.  He has a history of frequent falls and although her son reports that these falls have decreased in frequency she was found to have fallen outside of her bed 2 weeks ago.  Exam she has tenderness to palpation of bilateral anterior tibia and the left calf, there is a visible difference in calf size between the right and left calf and 1+ pitting edema in the left lower extremity. -Well score for DVT is consistent with at least moderate risk for DVT, screening left lower extremity Doppler did not show signs of DVT -3 of frequent falls and known osteoporosis puts her at risk for fracture, will obtain a tibia-fibula x-ray

## 2017-10-19 NOTE — Assessment & Plan Note (Signed)
BP Readings from Last 3 Encounters:  10/18/17 123/63  10/04/17 (!) 151/74  07/05/17 (!) 148/42  Blood pressure is well controlled today.  - continue metoprolol tartrate 12.5 BID

## 2017-10-19 NOTE — Assessment & Plan Note (Signed)
She feels that her symptoms are well controlled today, PHQ to screen screening is zero.  She does need to experience visual hallucinations.  Her son is open to bringing her to geriatrician psychiatrist for further assessment. - Refer to geripsych  -Continue sertraline 50 mg qd

## 2017-10-19 NOTE — Assessment & Plan Note (Signed)
Normal sinus rhythm on exam today  -Continue metoprolol tartrate 12.5 mg BID for rate control -continue Eliquis 5 mg qd for stroke prophylaxis

## 2017-10-20 NOTE — Progress Notes (Signed)
Internal Medicine Clinic Attending  Case discussed with Dr. Blum at the time of the visit.  We reviewed the resident's history and exam and pertinent patient test results.  I agree with the assessment, diagnosis, and plan of care documented in the resident's note. 

## 2017-11-12 DIAGNOSIS — M818 Other osteoporosis without current pathological fracture: Secondary | ICD-10-CM | POA: Diagnosis not present

## 2017-11-12 DIAGNOSIS — R269 Unspecified abnormalities of gait and mobility: Secondary | ICD-10-CM | POA: Diagnosis not present

## 2017-11-12 DIAGNOSIS — H109 Unspecified conjunctivitis: Secondary | ICD-10-CM | POA: Diagnosis not present

## 2017-11-12 DIAGNOSIS — R32 Unspecified urinary incontinence: Secondary | ICD-10-CM | POA: Diagnosis not present

## 2017-11-12 DIAGNOSIS — R260 Ataxic gait: Secondary | ICD-10-CM | POA: Diagnosis not present

## 2017-11-12 DIAGNOSIS — R2681 Unsteadiness on feet: Secondary | ICD-10-CM | POA: Diagnosis not present

## 2017-11-20 ENCOUNTER — Inpatient Hospital Stay (HOSPITAL_COMMUNITY)
Admission: EM | Admit: 2017-11-20 | Discharge: 2017-11-25 | DRG: 872 | Disposition: A | Discharge status: Skilled Nursing Facility | Payer: Medicare HMO | Attending: Internal Medicine | Admitting: Internal Medicine

## 2017-11-20 ENCOUNTER — Encounter (HOSPITAL_COMMUNITY): Payer: Self-pay | Admitting: *Deleted

## 2017-11-20 ENCOUNTER — Other Ambulatory Visit: Payer: Self-pay

## 2017-11-20 ENCOUNTER — Emergency Department (HOSPITAL_COMMUNITY): Payer: Medicare HMO

## 2017-11-20 DIAGNOSIS — N179 Acute kidney failure, unspecified: Secondary | ICD-10-CM | POA: Diagnosis present

## 2017-11-20 DIAGNOSIS — T07XXXA Unspecified multiple injuries, initial encounter: Secondary | ICD-10-CM | POA: Diagnosis not present

## 2017-11-20 DIAGNOSIS — K219 Gastro-esophageal reflux disease without esophagitis: Secondary | ICD-10-CM | POA: Diagnosis present

## 2017-11-20 DIAGNOSIS — E872 Acidosis: Secondary | ICD-10-CM | POA: Diagnosis present

## 2017-11-20 DIAGNOSIS — R079 Chest pain, unspecified: Secondary | ICD-10-CM | POA: Diagnosis not present

## 2017-11-20 DIAGNOSIS — B372 Candidiasis of skin and nail: Secondary | ICD-10-CM | POA: Diagnosis not present

## 2017-11-20 DIAGNOSIS — S59911A Unspecified injury of right forearm, initial encounter: Secondary | ICD-10-CM | POA: Diagnosis not present

## 2017-11-20 DIAGNOSIS — S59901A Unspecified injury of right elbow, initial encounter: Secondary | ICD-10-CM | POA: Diagnosis not present

## 2017-11-20 DIAGNOSIS — F329 Major depressive disorder, single episode, unspecified: Secondary | ICD-10-CM | POA: Diagnosis present

## 2017-11-20 DIAGNOSIS — S0990XA Unspecified injury of head, initial encounter: Secondary | ICD-10-CM | POA: Diagnosis not present

## 2017-11-20 DIAGNOSIS — Z7901 Long term (current) use of anticoagulants: Secondary | ICD-10-CM | POA: Diagnosis not present

## 2017-11-20 DIAGNOSIS — Z79899 Other long term (current) drug therapy: Secondary | ICD-10-CM | POA: Diagnosis not present

## 2017-11-20 DIAGNOSIS — E860 Dehydration: Secondary | ICD-10-CM | POA: Diagnosis present

## 2017-11-20 DIAGNOSIS — D649 Anemia, unspecified: Secondary | ICD-10-CM | POA: Diagnosis not present

## 2017-11-20 DIAGNOSIS — Z9181 History of falling: Secondary | ICD-10-CM | POA: Diagnosis not present

## 2017-11-20 DIAGNOSIS — R6520 Severe sepsis without septic shock: Secondary | ICD-10-CM | POA: Diagnosis not present

## 2017-11-20 DIAGNOSIS — S299XXA Unspecified injury of thorax, initial encounter: Secondary | ICD-10-CM | POA: Diagnosis not present

## 2017-11-20 DIAGNOSIS — S199XXA Unspecified injury of neck, initial encounter: Secondary | ICD-10-CM | POA: Diagnosis not present

## 2017-11-20 DIAGNOSIS — A4151 Sepsis due to Escherichia coli [E. coli]: Secondary | ICD-10-CM | POA: Diagnosis not present

## 2017-11-20 DIAGNOSIS — I4891 Unspecified atrial fibrillation: Secondary | ICD-10-CM | POA: Diagnosis not present

## 2017-11-20 DIAGNOSIS — F0390 Unspecified dementia without behavioral disturbance: Secondary | ICD-10-CM | POA: Diagnosis present

## 2017-11-20 DIAGNOSIS — R000 Tachycardia, unspecified: Secondary | ICD-10-CM | POA: Diagnosis not present

## 2017-11-20 DIAGNOSIS — E7800 Pure hypercholesterolemia, unspecified: Secondary | ICD-10-CM | POA: Diagnosis present

## 2017-11-20 DIAGNOSIS — I10 Essential (primary) hypertension: Secondary | ICD-10-CM | POA: Diagnosis not present

## 2017-11-20 DIAGNOSIS — R404 Transient alteration of awareness: Secondary | ICD-10-CM | POA: Diagnosis not present

## 2017-11-20 DIAGNOSIS — R41841 Cognitive communication deficit: Secondary | ICD-10-CM | POA: Diagnosis not present

## 2017-11-20 DIAGNOSIS — M810 Age-related osteoporosis without current pathological fracture: Secondary | ICD-10-CM | POA: Diagnosis present

## 2017-11-20 DIAGNOSIS — R21 Rash and other nonspecific skin eruption: Secondary | ICD-10-CM | POA: Diagnosis not present

## 2017-11-20 DIAGNOSIS — F29 Unspecified psychosis not due to a substance or known physiological condition: Secondary | ICD-10-CM | POA: Diagnosis not present

## 2017-11-20 DIAGNOSIS — A419 Sepsis, unspecified organism: Secondary | ICD-10-CM

## 2017-11-20 DIAGNOSIS — I499 Cardiac arrhythmia, unspecified: Secondary | ICD-10-CM | POA: Diagnosis not present

## 2017-11-20 DIAGNOSIS — F419 Anxiety disorder, unspecified: Secondary | ICD-10-CM | POA: Diagnosis not present

## 2017-11-20 DIAGNOSIS — R55 Syncope and collapse: Secondary | ICD-10-CM | POA: Diagnosis not present

## 2017-11-20 DIAGNOSIS — R278 Other lack of coordination: Secondary | ICD-10-CM | POA: Diagnosis not present

## 2017-11-20 DIAGNOSIS — Z87440 Personal history of urinary (tract) infections: Secondary | ICD-10-CM | POA: Diagnosis not present

## 2017-11-20 DIAGNOSIS — Z743 Need for continuous supervision: Secondary | ICD-10-CM | POA: Diagnosis not present

## 2017-11-20 DIAGNOSIS — M6281 Muscle weakness (generalized): Secondary | ICD-10-CM | POA: Diagnosis not present

## 2017-11-20 DIAGNOSIS — Z885 Allergy status to narcotic agent status: Secondary | ICD-10-CM | POA: Diagnosis not present

## 2017-11-20 DIAGNOSIS — I480 Paroxysmal atrial fibrillation: Secondary | ICD-10-CM | POA: Diagnosis not present

## 2017-11-20 DIAGNOSIS — A4150 Gram-negative sepsis, unspecified: Secondary | ICD-10-CM | POA: Diagnosis present

## 2017-11-20 DIAGNOSIS — B9620 Unspecified Escherichia coli [E. coli] as the cause of diseases classified elsewhere: Secondary | ICD-10-CM | POA: Diagnosis not present

## 2017-11-20 DIAGNOSIS — F332 Major depressive disorder, recurrent severe without psychotic features: Secondary | ICD-10-CM | POA: Diagnosis not present

## 2017-11-20 DIAGNOSIS — R4020 Unspecified coma: Secondary | ICD-10-CM | POA: Diagnosis not present

## 2017-11-20 DIAGNOSIS — M25521 Pain in right elbow: Secondary | ICD-10-CM | POA: Diagnosis not present

## 2017-11-20 DIAGNOSIS — N390 Urinary tract infection, site not specified: Secondary | ICD-10-CM | POA: Diagnosis not present

## 2017-11-20 DIAGNOSIS — R279 Unspecified lack of coordination: Secondary | ICD-10-CM | POA: Diagnosis not present

## 2017-11-20 DIAGNOSIS — R2689 Other abnormalities of gait and mobility: Secondary | ICD-10-CM | POA: Diagnosis not present

## 2017-11-20 DIAGNOSIS — W19XXXA Unspecified fall, initial encounter: Secondary | ICD-10-CM

## 2017-11-20 DIAGNOSIS — I482 Chronic atrial fibrillation: Secondary | ICD-10-CM | POA: Diagnosis not present

## 2017-11-20 HISTORY — DX: Unspecified dementia, unspecified severity, without behavioral disturbance, psychotic disturbance, mood disturbance, and anxiety: F03.90

## 2017-11-20 LAB — COMPREHENSIVE METABOLIC PANEL
ALT: 13 U/L (ref 0–44)
AST: 20 U/L (ref 15–41)
Albumin: 3.7 g/dL (ref 3.5–5.0)
Alkaline Phosphatase: 52 U/L (ref 38–126)
Anion gap: 13 (ref 5–15)
BUN: 32 mg/dL — AB (ref 8–23)
CALCIUM: 9.9 mg/dL (ref 8.9–10.3)
CO2: 19 mmol/L — ABNORMAL LOW (ref 22–32)
CREATININE: 1.3 mg/dL — AB (ref 0.44–1.00)
Chloride: 105 mmol/L (ref 98–111)
GFR calc Af Amer: 44 mL/min — ABNORMAL LOW (ref >60)
GFR, EST NON AFRICAN AMERICAN: 38 mL/min — AB (ref >60)
Glucose, Bld: 121 mg/dL — ABNORMAL HIGH (ref 70–99)
POTASSIUM: 4.4 mmol/L (ref 3.5–5.1)
Sodium: 137 mmol/L (ref 135–145)
TOTAL PROTEIN: 7.1 g/dL (ref 6.5–8.1)
Total Bilirubin: 1.3 mg/dL — ABNORMAL HIGH (ref 0.3–1.2)

## 2017-11-20 LAB — I-STAT CHEM 8, ED
BUN: 39 mg/dL — ABNORMAL HIGH (ref 8–23)
CREATININE: 1.2 mg/dL — AB (ref 0.44–1.00)
Calcium, Ion: 1.14 mmol/L — ABNORMAL LOW (ref 1.15–1.40)
Chloride: 105 mmol/L (ref 98–111)
Glucose, Bld: 125 mg/dL — ABNORMAL HIGH (ref 70–99)
HEMATOCRIT: 44 % (ref 36.0–46.0)
HEMOGLOBIN: 15 g/dL (ref 12.0–15.0)
Potassium: 4.4 mmol/L (ref 3.5–5.1)
SODIUM: 137 mmol/L (ref 135–145)
TCO2: 22 mmol/L (ref 22–32)

## 2017-11-20 LAB — URINALYSIS, ROUTINE W REFLEX MICROSCOPIC
BILIRUBIN URINE: NEGATIVE
Glucose, UA: NEGATIVE mg/dL
KETONES UR: NEGATIVE mg/dL
Nitrite: POSITIVE — AB
Protein, ur: NEGATIVE mg/dL
SPECIFIC GRAVITY, URINE: 1.017 (ref 1.005–1.030)
pH: 5 (ref 5.0–8.0)

## 2017-11-20 LAB — CBC WITH DIFFERENTIAL/PLATELET
Abs Immature Granulocytes: 0.1 10*3/uL (ref 0.0–0.1)
BASOS ABS: 0.1 10*3/uL (ref 0.0–0.1)
BASOS PCT: 1 %
EOS ABS: 0 10*3/uL (ref 0.0–0.7)
Eosinophils Relative: 0 %
HCT: 44.7 % (ref 36.0–46.0)
Hemoglobin: 14.2 g/dL (ref 12.0–15.0)
Immature Granulocytes: 1 %
Lymphocytes Relative: 20 %
Lymphs Abs: 2.1 10*3/uL (ref 0.7–4.0)
MCH: 29.6 pg (ref 26.0–34.0)
MCHC: 31.8 g/dL (ref 30.0–36.0)
MCV: 93.3 fL (ref 78.0–100.0)
Monocytes Absolute: 1.2 10*3/uL — ABNORMAL HIGH (ref 0.1–1.0)
Monocytes Relative: 12 %
Neutro Abs: 6.8 10*3/uL (ref 1.7–7.7)
Neutrophils Relative %: 66 %
PLATELETS: 199 10*3/uL (ref 150–400)
RBC: 4.79 MIL/uL (ref 3.87–5.11)
RDW: 13.4 % (ref 11.5–15.5)
WBC: 10.2 10*3/uL (ref 4.0–10.5)

## 2017-11-20 LAB — TROPONIN I: Troponin I: <0.03 ng/mL (ref <0.03)

## 2017-11-20 LAB — LACTIC ACID, PLASMA: LACTIC ACID, VENOUS: 1.2 mmol/L (ref 0.5–1.9)

## 2017-11-20 LAB — I-STAT CG4 LACTIC ACID, ED: Lactic Acid, Venous: 3.06 mmol/L (ref 0.5–1.9)

## 2017-11-20 LAB — CK: CK TOTAL: 49 U/L (ref 38–234)

## 2017-11-20 LAB — ETHANOL

## 2017-11-20 MED ORDER — POLYETHYLENE GLYCOL 3350 17 G PO PACK: 17.0000 g | PACK | Freq: Every day | ORAL | Status: DC | PRN

## 2017-11-20 MED ORDER — ACETAMINOPHEN 325 MG PO TABS
650.0000 mg | ORAL_TABLET | Freq: Four times a day (QID) | ORAL | Status: DC | PRN
  Administered 2017-11-20 – 2017-11-24 (×4): 650 mg via ORAL
  Filled 2017-11-20 (×4): qty 2

## 2017-11-20 MED ORDER — NYSTATIN 100000 UNIT/GM EX CREA
TOPICAL_CREAM | Freq: Two times a day (BID) | CUTANEOUS | Status: DC
  Administered 2017-11-20 – 2017-11-23 (×6): via TOPICAL
  Administered 2017-11-23: 1 via TOPICAL
  Administered 2017-11-24 – 2017-11-25 (×3): via TOPICAL
  Filled 2017-11-20: qty 15

## 2017-11-20 MED ORDER — SODIUM CHLORIDE 0.9 % IV SOLN
2.0000 g | INTRAVENOUS | Status: DC
  Administered 2017-11-20 – 2017-11-22 (×3): 2 g via INTRAVENOUS
  Filled 2017-11-20 (×4): qty 20

## 2017-11-20 MED ORDER — SERTRALINE HCL 50 MG PO TABS
50.0000 mg | ORAL_TABLET | Freq: Every day | ORAL | Status: DC
  Administered 2017-11-20 – 2017-11-24 (×5): 50 mg via ORAL
  Filled 2017-11-20 (×5): qty 1

## 2017-11-20 MED ORDER — SIMVASTATIN 10 MG PO TABS
10.0000 mg | ORAL_TABLET | Freq: Every day | ORAL | Status: DC
  Administered 2017-11-20 – 2017-11-24 (×5): 10 mg via ORAL
  Filled 2017-11-20 (×5): qty 1

## 2017-11-20 MED ORDER — APIXABAN 5 MG PO TABS
5.0000 mg | ORAL_TABLET | Freq: Two times a day (BID) | ORAL | Status: DC
  Administered 2017-11-20 – 2017-11-25 (×10): 5 mg via ORAL
  Filled 2017-11-20 (×10): qty 1

## 2017-11-20 MED ORDER — ACETAMINOPHEN 650 MG RE SUPP: 650.0000 mg | Freq: Four times a day (QID) | RECTAL | Status: DC | PRN

## 2017-11-20 MED ORDER — SODIUM CHLORIDE 0.9% FLUSH
3.0000 mL | Freq: Two times a day (BID) | INTRAVENOUS | Status: DC
  Administered 2017-11-20 – 2017-11-24 (×7): 3 mL via INTRAVENOUS

## 2017-11-20 MED ORDER — ONDANSETRON HCL 4 MG/2ML IJ SOLN: 4.0000 mg | Freq: Four times a day (QID) | INTRAMUSCULAR | Status: DC | PRN

## 2017-11-20 MED ORDER — ONDANSETRON HCL 4 MG PO TABS: 4.0000 mg | ORAL_TABLET | Freq: Four times a day (QID) | ORAL | Status: DC | PRN

## 2017-11-20 MED ORDER — METOPROLOL TARTRATE 25 MG PO TABS
12.5000 mg | ORAL_TABLET | Freq: Two times a day (BID) | ORAL | Status: DC
  Administered 2017-11-20 – 2017-11-25 (×9): 12.5 mg via ORAL
  Filled 2017-11-20 (×10): qty 1

## 2017-11-20 MED ORDER — SODIUM CHLORIDE 0.9 % IV BOLUS
1000.0000 mL | Freq: Once | INTRAVENOUS | Status: AC
  Administered 2017-11-20: 1000 mL via INTRAVENOUS

## 2017-11-20 MED ORDER — SODIUM CHLORIDE 0.9 % IV SOLN
INTRAVENOUS | Status: AC
  Administered 2017-11-20 – 2017-11-21 (×2): via INTRAVENOUS

## 2017-11-20 NOTE — ED Triage Notes (Signed)
PT here via [facility] after having been found face down, in the sun, for unknown down time - walker laying beside her.  Pt has hx of dementia and lives at home with her son, who was sleeping at the time. cbg 130, bp initially 118/72 that increased to 133/92 after 300 ml ns, O2 sats 92% that increased to 96% on 3L [place]. Skin tears to arms.

## 2017-11-20 NOTE — H&P (Signed)
Date: 11/20/2017               Patient Name:  Lisa Haynes MRN: [id number]  DOB: January 13, 1941 Age / Sex: 77 y.o., female   PCP: Eulah Pont, MD         Medical Service: Internal Medicine Teaching Service         Attending Physician: Dr. Earl Lagos, MD    First Contact: Dr. Teola Bradley, MD Pager: [phone]  Second Contact: Dr. Arnetha Courser, MD Pager: [phone]       After Hours (After 5p/  First Contact Pager: [phone]  weekends / holidays): Second Contact Pager: [phone]   Chief Complaint: Fall  History of Present Illness: Lisa Haynes is a 77 year old female with history of dementia and paroxysmal A. Fib (on Eliquis), presented with fall. according to a EMS staff, patient was found by neighbors outside of her home in the yard,.  We are not not sure about how long she was there but at last time, his son saw her last night going to bed.  When found in the yard, [name] was  face down on the ground in a hot day then able to walk back into her house. due to dementia and since no one is with her today in hospital, source of history is limited.  Contacting to son has been unsuccessful. She initially had A. fib with RVR however it came back to sinus rhythm at ED.  Past Medical History:  Diagnosis Date  . Dementia   . Depression   . GERD (gastroesophageal reflux disease)   . High cholesterol   . Hypertension   . Osteoarthritis    "legs" (11/11/2015)  . Osteoporosis   . Paroxysmal atrial fibrillation (HCC) 08/20/2014  . Urinary incontinence    Meds: No current facility-administered medications on file prior to encounter.    Current Outpatient Medications on File Prior to Encounter  Medication Sig Dispense Refill  . apixaban (ELIQUIS) 5 MG TABS tablet Take 1 tablet (5 mg total) by mouth 2 (two) times daily. 180 tablet 3  . alendronate (FOSAMAX) 70 MG tablet Take 1 tablet (70 mg total) by mouth every 7 (seven) days. Take with a full glass of water on an empty stomach. 4 tablet  11  . Calcium Carbonate-Vitamin D (CALCARB 600/D) 600-400 MG-UNIT tablet Take 1 tablet by mouth 2 (two) times daily with a meal. 30 tablet 3  . cholestyramine (QUESTRAN) 4 g packet MIX AND TAKE ONE PACKET BY MOUTH AS NEEDED 30 packet 6  . metoprolol tartrate (LOPRESSOR) 25 MG tablet Take 0.5 tablets (12.5 mg total) by mouth 2 (two) times daily. 90 tablet 2  . Multiple Vitamins-Minerals (MULTIVITAMIN WITH MINERALS) tablet Take 1 tablet by mouth daily.    . nitroGLYCERIN (NITROSTAT) 0.4 MG SL tablet DISSOLVE 1 TABLET UNDER THE TONGUE EVERY 5 MINUTES AS NEEDED FOR CHEST PAIN 30 tablet 0  . sertraline (ZOLOFT) 50 MG tablet TAKE 1 TABLET BY MOUTH AT BEDTIME (Patient taking differently: TAKE 1 TABLET (50mg ) BY MOUTH AT BEDTIME) 90 tablet 2  . simvastatin (ZOCOR) 10 MG tablet Take 1 tablet (10 mg total) by mouth daily. 90 tablet 11     Allergies: Allergies as of 11/20/2017 - Review Complete 11/20/2017  Allergen Reaction Noted  . Morphine and related Other (See Comments) 03/03/2016    Family History:    Problem Relation Age of Onset  . Heart disease Mother        onset 46s  .  Heart disease Brother [id number]    Social History Social History        Tobacco Use  . Smoking status: Never Smoker  . Smokeless tobacco: Never Used  Substance Use Topics  . Alcohol use: No    Alcohol/week: 0.0 oz  . Drug use: No   CMP Latest Ref Rng & Units 11/20/2017 11/20/2017 10/04/2017  Glucose 70 - 99 mg/dL 161(W125(H) 960(A121(H) 540(J106(H)  BUN 8 - 23 mg/dL 81(X39(H) 91(Y32(H) 18  Creatinine 0.44 - 1.00 mg/dL 7.82(N1.20(H) 5.62(Z1.30(H) 3.08(M1.08(H)  Sodium 135 - 145 mmol/L 137 137 140  Potassium 3.5 - 5.1 mmol/L 4.4 4.4 3.6  Chloride 98 - 111 mmol/L 105 105 109  CO2 22 - 32 mmol/L - 19(L) 23  Calcium 8.9 - 10.3 mg/dL - 9.9 9.3  Total Protein 6.5 - 8.1 g/dL - 7.1 6.6  Total Bilirubin 0.3 - 1.2 mg/dL - 1.3(H) 0.8  Alkaline Phos 38 - 126 U/L - 52 53  AST 15 - 41 U/L - 20 17  ALT 0 - 44 U/L - 13 10(L)   CBC Latest Ref Rng & Units  11/20/2017 11/20/2017 10/04/2017  WBC 4.0 - 10.5 K/uL - 10.2 10.4  Hemoglobin 12.0 - 15.0 g/dL 57.815.0 46.914.2 62.912.6  Hematocrit 36.0 - 46.0 % 44.0 44.7 40.3  Platelets 150 - 400 K/uL - 199 181    Ref Range & Units 14:16 (11/20/17)      Troponin I <0.03 ng/mL <0.03        CK: 49   Ref Range & Units 14:26 7680yr ago  Alcohol, Ethyl (B) <10 mg/dL <52<10     .  Ref Range & Units 14:27   Color, Urine YELLOW YELLOW    APPearance CLEAR HAZYAbnormal     Specific Gravity, Urine 1.005 - 1.030 1.017    pH 5.0 - 8.0 5.0    Glucose, UA NEGATIVE mg/dL NEGATIVE    Hgb urine dipstick NEGATIVE MODERATEAbnormal     Bilirubin Urine NEGATIVE NEGATIVE    Ketones, ur NEGATIVE mg/dL NEGATIVE    Protein, ur NEGATIVE mg/dL NEGATIVE    Nitrite NEGATIVE POSITIVEAbnormal     Leukocytes, UA NEGATIVE MODERATEAbnormal     RBC / HPF 0 - 5 RBC/hpf 0-5    WBC, UA 0 - 5 WBC/hpf 21-50    Bacteria, UA NONE SEEN MANYAbnormal     Squamous Epithelial / LPF 0 - 5 0-5    WBC Clumps  PRESENT    Mucus  PRESENT    Hyaline Casts, UA  PRESENT     Lactic Acid, Venous    Component Value Date/Time   LATICACIDVEN 3.06 (HH) 11/20/2017 1438    Review of Systems: A complete ROS was negative except as per HPI.  Physical Exam: Blood pressure (!) 113/59, pulse 93, temperature 97.8 F (36.6 C), temperature source Oral, resp. rate (!) 21, height 5\' 1"  (1.549 m), weight 178 lb (80.7 kg), SpO2 99 %.  Ph/E: Pleasant lady. Alert but oriented only to her name (Not DOB). In no acute distress. Lungs: Clear to auscultation Abdomen: Soft and non tenter. BS are present. There is large area of erythema/rash at her right groin and right lower abdomen.  Extremities: Abrasion with skin erruption and dried blood at Right elbow, forearm and toes. ROM are NL. No peripheral edema.   EKG: personally reviewed my interpretation is Afib with RVR CXR: personally reviewed my interpretation is, there is rotation and can not judge mediastinum well. How ewer, no  lung pulmonary abnormality,  pneumothorax is noted  CT head: No acute brain injury. Minimal chronic ischemic microvascular disease and mild age related atrophic change. No acute cervical spine injury. Mild spondylosis of the cervical spine with bilateral neural foraminal narrowing at the C3-4 and C4-5 levels. Fusion of the left facets of C3 and C4. XR of Upper extremities: No acute forearm and elbow  FX Pelvic Xray: No Acute pelvic FX  Assessment & Plan by Problem:  Possible syncope: Patient with Afib with RVR, may be triggered by her recent UTI, resulted in presumed syncopal episode. Converetd back to sinus rhythm while in ED.   -Continue Eliquis -Continue Metoprolol 12.5 BID -Cardiac monitoring  Urosepsis: Patient U/A appears dirty. Elevated lactic acid and Hx of pen sensitive Ecoli. (Feb 2019).  Currently afebrile with no leucocytosis. Can not provide Hx due to base line dementia. Started on Ceftriaxon. U/C and B/C are pending -Continue Ceftriaxon -Trend Lactic acid -Continue Ceftriaxon  Hx of frequent falls: There was no radiologic evidence of Fx or injury due to recent fall. According to recent PCP note, they are trying to put he at [facility] program. Son with full time job and unable to provide full assistant -PT/OT evaluation  Dementia and depression: Is only oriented to her name. Mentions his son's name but not more. -Continue home dose of Zolaft  HTN: Currently normotensive -Continue home dose of Metoprolol  Dispo: Admit patient to Inpatient with expected length of stay greater than 2 midnights.  SignedChevis Pretty, MD 11/20/2017, 5:15 PM  Pager: [phone]

## 2017-11-20 NOTE — ED Provider Notes (Signed)
[facility] EMERGENCY DEPARTMENT Provider Note   CSN: [id number] Arrival date & time: 11/20/17  1405   LEVEL 5 CAVEAT - DEMENTIA   History   Chief Complaint Chief Complaint  Patient presents with  . Fall  . Tachycardia    afib rvr    HPI Lisa Haynes is a 77 y.o. female.  HPI  77 year old female with a history of dementia and paroxysmal atrial fibrillation who is on Eliquis presents with fall.  History is very limited and taken from the EMS staff as the patient has dementia.  The report from EMS states that the patient was found by neighbors outside in her yard facedown in the yard in the hot day.  It is unclear how long she is been there but the son stated that he last saw her last night and she went to bed later than him but the time is unclear.  The patient was found in the yard but was able to be walked back into the house which is where EMS picked her up.  She is alert and oriented to self only which is typical for the patient.  EMS has noted her to be in atrial fibrillation with RVR.  No other significant history is available and no other family is available.  Past Medical History:  Diagnosis Date  . Dementia   . Depression   . GERD (gastroesophageal reflux disease)   . High cholesterol   . Hypertension   . Osteoarthritis    "legs" (11/11/2015)  . Osteoporosis   . Paroxysmal atrial fibrillation (HCC) 08/20/2014  . Urinary incontinence     Patient Active Problem List   Diagnosis Date Noted  . Leg pain 10/19/2017  . Acute cystitis without hematuria   . Altered mental status   . Chronic atrial fibrillation (HCC)   . 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 Dehydration   . Late onset Alzheimer's disease without behavioral disturbance   . Acute renal injury (HCC) 07/01/2017  . E. coli UTI (urinary tract infection) 06/30/2017  . Cough 12/28/2016  . Black stool 12/28/2016  . Pressure injury of skin 11/20/2016  . Inferior pubic  ramus fracture (HCC) 11/19/2016  . Hallucinations 09/12/2016  . Unsteady gait 05/29/2016  . Atrial flutter (HCC) 11/20/2015  . 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 Paroxysmal atrial fibrillation (HCC) 09/25/2015  . Diverticulosis 06/20/2014  . Preventative health care 10/15/2010  . Hyperlipidemia 01/29/2009  . GERD 04/15/2008  . Depression with anxiety 09/01/2007  . Essential hypertension 07/13/2006  . Osteoporosis 07/13/2006    Past Surgical History:  Procedure Laterality Date  . APPENDECTOMY    . CHOLECYSTECTOMY N/A 10/01/2014   Procedure: LAPAROSCOPIC CHOLECYSTECTOMY ;  Surgeon: Harriette Bouillon, MD;  Location: [facility] OR;  Service: General;  Laterality: N/A;  . DILATION AND CURETTAGE OF UTERUS     "after I had my boys"  . ERCP N/A 09/27/2014   Procedure: ENDOSCOPIC RETROGRADE CHOLANGIOPANCREATOGRAPHY (ERCP);  Surgeon: Jeani Hawking, MD;  Location: [facility] ENDOSCOPY;  Service: Endoscopy;  Laterality: N/A;  . VAGINAL HYSTERECTOMY     with unilateral oophorectomy     OB History   None      Home Medications    Prior to Admission medications   Medication Sig Start Date End Date Taking? Authorizing Provider  alendronate (FOSAMAX) 70 MG tablet Take 1 tablet (70 mg total) by mouth every 7 (seven) days. Take with a full glass of water on an empty stomach. 09/24/16  Thomasene Lot, MD  apixaban (ELIQUIS) 5 MG TABS tablet Take 1 tablet (5 mg total) by mouth 2 (two) times daily. 10/18/17   Eulah Pont, MD  Calcium Carbonate-Vitamin D (CALCARB 600/D) 600-400 MG-UNIT tablet Take 1 tablet by mouth 2 (two) times daily with a meal. 04/08/17   Nedrud, Jeanella Flattery, MD  cholestyramine (QUESTRAN) 4 g packet MIX AND TAKE ONE PACKET BY MOUTH AS NEEDED 08/23/17   Eulah Pont, MD  metoprolol tartrate (LOPRESSOR) 25 MG tablet Take 0.5 tablets (12.5 mg total) by mouth 2 (two) times daily. 04/08/17 07/07/17  Rozann Lesches, MD  Multiple Vitamins-Minerals (MULTIVITAMIN WITH MINERALS) tablet Take 1 tablet by mouth  daily.    [provider]  nitroGLYCERIN (NITROSTAT) 0.4 MG SL tablet DISSOLVE 1 TABLET UNDER THE TONGUE EVERY 5 MINUTES AS NEEDED FOR CHEST PAIN 12/14/16   Gust Rung, DO  sertraline (ZOLOFT) 50 MG tablet TAKE 1 TABLET BY MOUTH AT BEDTIME Patient taking differently: TAKE 1 TABLET (50mg ) BY MOUTH AT BEDTIME 05/12/17   Eulah Pont, MD  simvastatin (ZOCOR) 10 MG tablet Take 1 tablet (10 mg total) by mouth daily. 10/18/17   Eulah Pont, MD    Family History Family History  Problem Relation Age of Onset  . Heart disease Mother        onset 48s  . Heart disease Brother 39    Social History Social History   Tobacco Use  . Smoking status: Never Smoker  . Smokeless tobacco: Never Used  Substance Use Topics  . Alcohol use: No    Alcohol/week: 0.0 oz  . Drug use: No     Allergies   Morphine and related   Review of Systems Review of Systems  Unable to perform ROS: Dementia     Physical Exam Updated Vital Signs BP 129/81   Pulse 89   Temp 99.2 F (37.3 C) (Rectal)   Resp 18   Ht 5\' 1"  (1.549 m)   Wt 80.7 kg (178 lb)   SpO2 96%   BMI 33.63 kg/m   Physical Exam  Constitutional: She appears well-developed and well-nourished. No distress.  HENT:  Head: Normocephalic and atraumatic.  Right Ear: External ear normal.  Left Ear: External ear normal.  Nose: Nose normal.  Edentulous  Eyes: Right eye exhibits no discharge. Left eye exhibits no discharge.  Cardiovascular: Normal heart sounds. An irregular rhythm present. Tachycardia present.  Pulmonary/Chest: Effort normal and breath sounds normal. She has no rales.  Abdominal: Soft. She exhibits no distension. There is no tenderness.    Musculoskeletal:       Right elbow: She exhibits swelling and laceration (abrasions). She exhibits normal range of motion.       Right forearm: She exhibits laceration (abrasions).       Right foot: There is laceration. There is no tenderness and no swelling.       Left foot:  There is laceration. There is no tenderness.  Multiple small abrasions to bilateral feet but no swelling or tenderness.  Proximal forearm and elbow has swelling as well as multiple skin tears/abrasions  Neurological: She is alert.  Patient is awake but responds with nonsense when asked pointed questions.  She is disoriented.  However she does somewhat follow commands and moves all 4 extremities equally and appears to have equal strength.  Skin: Skin is warm and dry. She is not diaphoretic.  Nursing note and vitals reviewed.    ED Treatments / Results  Labs (all labs ordered are listed,  but only abnormal results are displayed) Labs Reviewed  COMPREHENSIVE METABOLIC PANEL - Abnormal; Notable for the following components:      Result Value   CO2 19 (*)    Glucose, Bld 121 (*)    BUN 32 (*)    Creatinine, Ser 1.30 (*)    Total Bilirubin 1.3 (*)    GFR calc non Af Amer 38 (*)    GFR calc Af Amer 44 (*)    All other components within normal limits  CBC WITH DIFFERENTIAL/PLATELET - Abnormal; Notable for the following components:   Monocytes Absolute 1.2 (*)    All other components within normal limits  URINALYSIS, ROUTINE W REFLEX MICROSCOPIC - Abnormal; Notable for the following components:   APPearance HAZY (*)    Hgb urine dipstick MODERATE (*)    Nitrite POSITIVE (*)    Leukocytes, UA MODERATE (*)    Bacteria, UA MANY (*)    All other components within normal limits  I-STAT CHEM 8, ED - Abnormal; Notable for the following components:   BUN 39 (*)    Creatinine, Ser 1.20 (*)    Glucose, Bld 125 (*)    Calcium, Ion 1.14 (*)    All other components within normal limits  I-STAT CG4 LACTIC ACID, ED - Abnormal; Notable for the following components:   Lactic Acid, Venous 3.06 (*)    All other components within normal limits  URINE CULTURE  CULTURE, BLOOD (ROUTINE X 2)  CULTURE, BLOOD (ROUTINE X 2)  TROPONIN I  CK  ETHANOL  CBG MONITORING, ED    EKG EKG  Interpretation  Date/Time:  Sunday November 20 2017 14:16:37 EDT Ventricular Rate:  154 PR Interval:    QRS Duration: 73 QT Interval:  284 QTC Calculation: 455 R Axis:   17 Text Interpretation:  Atrial fibrillation with rapid V-rate Low voltage, precordial leads rate faster, afib now present compared to Mar 2019 Confirmed by Pricilla Loveless [phone]) on 11/20/2017 2:25:13 PM   Radiology Dg Chest 1 View  Result Date: 11/20/2017 CLINICAL DATA:  Pain following fall EXAM: CHEST  1 VIEW COMPARISON:  June 30, 2017 FINDINGS: No edema or consolidation. Heart size and pulmonary vascularity are normal. No adenopathy. No pneumothorax. There is an old healed fracture of the right seventh rib posteriorly. IMPRESSION: No edema or consolidation.  Stable cardiac silhouette. Electronically Signed   By: Bretta Bang III M.D.   On: 11/20/2017 15:28   Dg Elbow Complete Right  Result Date: 11/20/2017 CLINICAL DATA:  Larey Seat today.  Right elbow pain. EXAM: RIGHT ELBOW - COMPLETE 3+ VIEW COMPARISON:  None. FINDINGS: Moderate elbow joint degenerative changes with joint space narrowing and spurring. No acute fracture, osteochondral lesion or joint effusion. IMPRESSION: No acute elbow fracture. Electronically Signed   By: Rudie Meyer M.D.   On: 11/20/2017 15:20   Dg Forearm Right  Result Date: 11/20/2017 CLINICAL DATA:  Larey Seat today. EXAM: RIGHT FOREARM - 2 VIEW COMPARISON:  None. FINDINGS: The wrist and elbow joints are maintained. Moderate degenerative changes. No acute forearm fracture. Advanced vascular calcifications are noted. IMPRESSION: No acute forearm fracture. Electronically Signed   By: Rudie Meyer M.D.   On: 11/20/2017 15:20   Ct Head Wo Contrast  Result Date: 11/20/2017 CLINICAL DATA:  Patient found face down for unknown amount of time. History of dementia. EXAM: CT HEAD WITHOUT CONTRAST CT CERVICAL SPINE WITHOUT CONTRAST TECHNIQUE: Multidetector CT imaging of the head and cervical spine was  performed following the standard protocol  without intravenous contrast. Multiplanar CT image reconstructions of the cervical spine were also generated. COMPARISON:  Head CT 06/30/2017 and head/cervical spine CT 09/18/2016. FINDINGS: CT HEAD FINDINGS Brain: Ventricles and cisterns are within normal. There is mild age related atrophic change in mild chronic ischemic microvascular disease. There is no mass, mass effect, shift of midline structures or acute hemorrhage. No evidence of acute infarction. Basal ganglia calcifications. Vascular: No hyperdense vessel or unexpected calcification. Skull: Normal. Negative for fracture or focal lesion. Sinuses/Orbits: No acute finding. Other: None. CT CERVICAL SPINE FINDINGS Alignment: Subtle degenerative stairstep subluxations anteriorly of C3 on C4 and C4 on C5. Skull base and vertebrae: Mild spondylosis throughout the cervical spine. Fragmentation adjacent the dens unchanged. Atlantoaxial articulation is otherwise unremarkable. There is moderate uncovertebral joint spurring and facet arthropathy. Fusion of the left facet joints of C3 and C4. No evidence of acute fracture. Bilateral neural foraminal narrowing at the C4-5 level and C3-4 level. Soft tissues and spinal canal: No prevertebral fluid or swelling. No visible canal hematoma. Disc levels:  No significant disc space narrowing. Upper chest: Negative. Other: Subtle stable anterior wedging of T2. IMPRESSION: No acute brain injury. Minimal chronic ischemic microvascular disease and mild age related atrophic change. No acute cervical spine injury. Mild spondylosis of the cervical spine with bilateral neural foraminal narrowing at the C3-4 and C4-5 levels. Fusion of the left facets of C3 and C4. Electronically Signed   By: Elberta Fortisaniel  Boyle M.D.   On: 11/20/2017 15:35   Ct Cervical Spine Wo Contrast  Result Date: 11/20/2017 CLINICAL DATA:  Patient found face down for unknown amount of time. History of dementia. EXAM: CT HEAD  WITHOUT CONTRAST CT CERVICAL SPINE WITHOUT CONTRAST TECHNIQUE: Multidetector CT imaging of the head and cervical spine was performed following the standard protocol without intravenous contrast. Multiplanar CT image reconstructions of the cervical spine were also generated. COMPARISON:  Head CT 06/30/2017 and head/cervical spine CT 09/18/2016. FINDINGS: CT HEAD FINDINGS Brain: Ventricles and cisterns are within normal. There is mild age related atrophic change in mild chronic ischemic microvascular disease. There is no mass, mass effect, shift of midline structures or acute hemorrhage. No evidence of acute infarction. Basal ganglia calcifications. Vascular: No hyperdense vessel or unexpected calcification. Skull: Normal. Negative for fracture or focal lesion. Sinuses/Orbits: No acute finding. Other: None. CT CERVICAL SPINE FINDINGS Alignment: Subtle degenerative stairstep subluxations anteriorly of C3 on C4 and C4 on C5. Skull base and vertebrae: Mild spondylosis throughout the cervical spine. Fragmentation adjacent the dens unchanged. Atlantoaxial articulation is otherwise unremarkable. There is moderate uncovertebral joint spurring and facet arthropathy. Fusion of the left facet joints of C3 and C4. No evidence of acute fracture. Bilateral neural foraminal narrowing at the C4-5 level and C3-4 level. Soft tissues and spinal canal: No prevertebral fluid or swelling. No visible canal hematoma. Disc levels:  No significant disc space narrowing. Upper chest: Negative. Other: Subtle stable anterior wedging of T2. IMPRESSION: No acute brain injury. Minimal chronic ischemic microvascular disease and mild age related atrophic change. No acute cervical spine injury. Mild spondylosis of the cervical spine with bilateral neural foraminal narrowing at the C3-4 and C4-5 levels. Fusion of the left facets of C3 and C4. Electronically Signed   By: Elberta Fortisaniel  Boyle M.D.   On: 11/20/2017 15:35   Dg Hips Bilat W Or Wo Pelvis 2  Views  Result Date: 11/20/2017 CLINICAL DATA:  Pain following fall EXAM: DG HIP (WITH OR WITHOUT PELVIS) 4+v BILAT COMPARISON:  CT pelvis  with bony reformats November 19, 2016 FINDINGS: Frontal pelvis as well as frontal and lateral views of each hip-total five views-obtained. There is a prior fracture of the right ischium with remodeling. There is a probable old fracture of the inferior sacral ala on the right with sclerosis in this area. No acute fracture is evident. No dislocation. There is mild symmetric narrowing of both hip joints. Bones are osteoporotic. There are multiple foci of arterial vascular calcification. IMPRESSION: No acute fracture or dislocation. Old fracture right ischium with remodeling. Suspect old fracture in the inferior right sacral ala with sclerosis seen in this area. Mild symmetric narrowing of both hip joints. Bones osteoporotic. There is extensive superficial and profunda femoral artery atherosclerotic calcification bilaterally. Electronically Signed   By: Bretta Bang III M.D.   On: 11/20/2017 15:27    Procedures .Critical Care Performed by: Pricilla Loveless, MD Authorized by: Pricilla Loveless, MD   Critical care provider statement:    Critical care time (minutes):  30   Critical care was necessary to treat or prevent imminent or life-threatening deterioration of the following conditions:  Shock   Critical care was time spent personally by me on the following activities:  Development of treatment plan with patient or surrogate, discussions with consultants, evaluation of patient's response to treatment, ordering and performing treatments and interventions, ordering and review of laboratory studies, ordering and review of radiographic studies, pulse oximetry, re-evaluation of patient's condition, review of old charts and obtaining history from patient or surrogate   (including critical care time)  Medications Ordered in ED Medications  cefTRIAXone (ROCEPHIN) 2 g in  sodium chloride 0.9 % 100 mL IVPB (has no administration in time range)     Initial Impression / Assessment and Plan / ED Course  I have reviewed the triage vital signs and the nursing notes.  Pertinent labs & imaging results that were available during my care of the patient were reviewed by me and considered in my medical decision making (see chart for details).     Patient is found to have A. fib with RVR but her blood pressure stable.  This actually converted on her own.  She is found to have a urinary tract infection with a lactate of 3.  It is unclear what caused her to be laying in the grass today but probably she had syncope from the A. fib with RVR.  However no family is here to corroborate.  I called the son on both numbers listed but no one answered.  She has been given IV Rocephin and will be given IV fluids.  Internal medicine will admit.  Final Clinical Impressions(s) / ED Diagnoses   Final diagnoses:  Severe sepsis [facility])  Acute UTI    ED Discharge Orders    None       Pricilla Loveless, MD 11/20/17 1606

## 2017-11-20 NOTE — ED Notes (Signed)
Lisa PippinDanny Haynes son [phone]

## 2017-11-21 DIAGNOSIS — Z7901 Long term (current) use of anticoagulants: Secondary | ICD-10-CM

## 2017-11-21 DIAGNOSIS — N390 Urinary tract infection, site not specified: Secondary | ICD-10-CM

## 2017-11-21 DIAGNOSIS — I10 Essential (primary) hypertension: Secondary | ICD-10-CM

## 2017-11-21 DIAGNOSIS — Z87440 Personal history of urinary (tract) infections: Secondary | ICD-10-CM

## 2017-11-21 DIAGNOSIS — I480 Paroxysmal atrial fibrillation: Secondary | ICD-10-CM

## 2017-11-21 DIAGNOSIS — N179 Acute kidney failure, unspecified: Secondary | ICD-10-CM

## 2017-11-21 DIAGNOSIS — R55 Syncope and collapse: Secondary | ICD-10-CM

## 2017-11-21 DIAGNOSIS — F0390 Unspecified dementia without behavioral disturbance: Secondary | ICD-10-CM

## 2017-11-21 DIAGNOSIS — Z9181 History of falling: Secondary | ICD-10-CM

## 2017-11-21 DIAGNOSIS — Z79899 Other long term (current) drug therapy: Secondary | ICD-10-CM

## 2017-11-21 DIAGNOSIS — B9620 Unspecified Escherichia coli [E. coli] as the cause of diseases classified elsewhere: Secondary | ICD-10-CM

## 2017-11-21 LAB — COMPREHENSIVE METABOLIC PANEL
ALBUMIN: 2.9 g/dL — AB (ref 3.5–5.0)
ALK PHOS: 44 U/L (ref 38–126)
ALT: 9 U/L (ref 0–44)
AST: 16 U/L (ref 15–41)
Anion gap: 8 (ref 5–15)
BILIRUBIN TOTAL: 1.1 mg/dL (ref 0.3–1.2)
BUN: 24 mg/dL — AB (ref 8–23)
CALCIUM: 8.2 mg/dL — AB (ref 8.9–10.3)
CO2: 22 mmol/L (ref 22–32)
CREATININE: 0.94 mg/dL (ref 0.44–1.00)
Chloride: 113 mmol/L — ABNORMAL HIGH (ref 98–111)
GFR calc Af Amer: >60 mL/min (ref >60)
GFR, EST NON AFRICAN AMERICAN: 56 mL/min — AB (ref >60)
GLUCOSE: 105 mg/dL — AB (ref 70–99)
Potassium: 3.4 mmol/L — ABNORMAL LOW (ref 3.5–5.1)
SODIUM: 143 mmol/L (ref 135–145)
TOTAL PROTEIN: 5.6 g/dL — AB (ref 6.5–8.1)

## 2017-11-21 LAB — CBC
HEMATOCRIT: 37.5 % (ref 36.0–46.0)
HEMOGLOBIN: 11.7 g/dL — AB (ref 12.0–15.0)
MCH: 30 pg (ref 26.0–34.0)
MCHC: 31.2 g/dL (ref 30.0–36.0)
MCV: 96.2 fL (ref 78.0–100.0)
Platelets: 146 10*3/uL — ABNORMAL LOW (ref 150–400)
RBC: 3.9 MIL/uL (ref 3.87–5.11)
RDW: 13.6 % (ref 11.5–15.5)
WBC: 8.7 10*3/uL (ref 4.0–10.5)

## 2017-11-21 LAB — GLUCOSE, CAPILLARY: Glucose-Capillary: 103 mg/dL — ABNORMAL HIGH (ref 70–99)

## 2017-11-21 NOTE — NC FL2 (Signed)
[place] MEDICAID FL2 LEVEL OF CARE SCREENING TOOL     IDENTIFICATION  Patient Name: Lisa Haynes Birthdate: 12-Aug-1940 Sex: female Admission Date (Current Location): 11/20/2017  [facility] and [place] Number:  Producer, television/film/video and Address:  [facility], [address] [place], [place] [address]      Provider Number: [id number]  Attending Physician Name and Address:  Earl Lagos, MD  Relative Name and Phone Number:  [place]anequa Kretz; son; [phone]    Current Level of Care: Hospital Recommended Level of Care: Skilled Nursing Facility Prior Approval Number:    Date Approved/Denied:   PASRR Number: [id number]  Discharge Plan: SNF    Current Diagnoses: Patient Active Problem List   Diagnosis Date Noted  . Sepsis due to gram-negative UTI (HCC) 11/20/2017  . Leg pain 10/19/2017  . Acute cystitis without hematuria   . Altered mental status   . Chronic atrial fibrillation (HCC)   . 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 Dehydration   . Late onset Alzheimer's disease without behavioral disturbance   . Acute renal injury (HCC) 07/01/2017  . E. coli UTI (urinary tract infection) 06/30/2017  . Cough 12/28/2016  . Black stool 12/28/2016  . Pressure injury of skin 11/20/2016  . Inferior pubic ramus fracture (HCC) 11/19/2016  . Hallucinations 09/12/2016  . Unsteady gait 05/29/2016  . Atrial flutter (HCC) 11/20/2015  . 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 Paroxysmal atrial fibrillation (HCC) 09/25/2015  . Diverticulosis 06/20/2014  . Preventative health care 10/15/2010  . Hyperlipidemia 01/29/2009  . GERD 04/15/2008  . Depression with anxiety 09/01/2007  . Essential hypertension 07/13/2006  . Osteoporosis 07/13/2006    Orientation RESPIRATION BLADDER Height & Weight     Self  Normal Incontinent Weight: 177 lb 7.5 oz (80.5 kg) Height:  5\' 1"  (154.9 cm)  BEHAVIORAL SYMPTOMS/MOOD NEUROLOGICAL BOWEL  NUTRITION STATUS      Incontinent Diet  AMBULATORY STATUS COMMUNICATION OF NEEDS Skin   Extensive Assist Verbally Skin abrasions(skin tear on right lower arm with gauze; MASD groin and abdomen (skin folds); abrasion on left and right foot)                       Personal Care Assistance Level of Assistance  Bathing, Feeding, Dressing Bathing Assistance: Maximum assistance Feeding assistance: Limited assistance Dressing Assistance: Maximum assistance     Functional Limitations Info  Sight, Hearing, Speech Sight Info: Adequate Hearing Info: Adequate Speech Info: Adequate    SPECIAL CARE FACTORS FREQUENCY  PT (By licensed PT), OT (By licensed OT)     PT Frequency: 5x week OT Frequency: 5x week            Contractures Contractures Info: Not present    Additional Factors Info  Code Status, Allergies, Psychotropic Code Status Info: Full Code Allergies Info: MORPHINE AND RELATED  Psychotropic Info: sertraline (ZOLOFT) tablet 50 mg daily at bedtime PO         Current Medications (11/21/2017):  This is the current hospital active medication list Current Facility-Administered Medications  Medication Dose Route Frequency Provider Last Rate Last Dose  . acetaminophen (TYLENOL) tablet 650 mg  650 mg Oral Q6H PRN Arnetha Courser, MD   650 mg at 11/20/17 2213   Or  . acetaminophen (TYLENOL) suppository 650 mg  650 mg Rectal Q6H PRN Arnetha Courser, MD      . apixaban (ELIQUIS) tablet 5 mg  5 mg Oral BID Arnetha Courser, MD  5 mg at 11/21/17 0952  . cefTRIAXone (ROCEPHIN) 2 g in sodium chloride 0.9 % 100 mL IVPB  2 g Intravenous Q24H Pricilla LovelessGoldston, Scott, MD 200 mL/hr at 11/21/17 1416 2 g at 11/21/17 1416  . metoprolol tartrate (LOPRESSOR) tablet 12.5 mg  12.5 mg Oral BID Arnetha CourserAmin, Sumayya, MD   12.5 mg at 11/21/17 0952  . nystatin cream (MYCOSTATIN)   Topical BID Arnetha CourserAmin, Sumayya, MD      . ondansetron (ZOFRAN) tablet 4 mg  4 mg Oral Q6H PRN Arnetha CourserAmin, Sumayya, MD       Or  . ondansetron (ZOFRAN)  injection 4 mg  4 mg Intravenous Q6H PRN Arnetha CourserAmin, Sumayya, MD      . polyethylene glycol (MIRALAX / GLYCOLAX) packet 17 g  17 g Oral Daily PRN Arnetha CourserAmin, Sumayya, MD      . sertraline (ZOLOFT) tablet 50 mg  50 mg Oral QHS Arnetha CourserAmin, Sumayya, MD   50 mg at 11/20/17 2216  . simvastatin (ZOCOR) tablet 10 mg  10 mg Oral QHS Arnetha CourserAmin, Sumayya, MD   10 mg at 11/20/17 2214  . sodium chloride flush (NS) 0.9 % injection 3 mL  3 mL Intravenous Q12H Arnetha CourserAmin, Sumayya, MD   3 mL at 11/20/17 2216     Discharge Medications: Please see discharge summary for a list of discharge medications.  Relevant Imaging Results:  Relevant Lab Results:   Additional Information SS#[id number]  Larence Thone H [place], [place]

## 2017-11-21 NOTE — Evaluation (Signed)
Physical Therapy Evaluation Patient Details Name: Lisa Haynes MRN: [id number] DOB: [birth year] Today's Date: 11/21/2017   History of Present Illness  Pt is a 77 y.o. female with history of dementia, depression, GERD, high cholesterol, hypertension, osteoarthritis, osteoporosis, paroxysmal atrial fibrillation, and urinary incontinence. Pt was found by neighbors face down in her yard. Admitted with possible syncope and UTI.    Clinical Impression  Pt admitted with above diagnosis. Pt currently with functional limitations due to the deficits listed below (see PT Problem List). On eval, pt required mod assist bed mobility and transfers. Mobility limited by pain, weakness and advanced dementia. Pt will benefit from skilled PT to increase their independence and safety with mobility to allow discharge to the venue listed below.       Follow Up Recommendations SNF    Equipment Recommendations  None recommended by PT    Recommendations for Other Services       Precautions / Restrictions Precautions Precautions: Fall Precaution Comments: Pt with dementia Restrictions Weight Bearing Restrictions: No      Mobility  Bed Mobility Overal bed mobility: Needs Assistance Bed Mobility: Supine to Sit     Supine to sit: Mod assist;HOB elevated     General bed mobility comments: continuous verbal cues for sequencing  Transfers Overall transfer level: Needs assistance Equipment used: Rolling walker (2 wheeled) Transfers: Sit to/from UGI CorporationStand;Stand Pivot Transfers Sit to Stand: Mod assist Stand pivot transfers: Mod assist       General transfer comment: cues for hand placement. Increased time and effort. continuous cues for sequencing.  Ambulation/Gait             General Gait Details: unable  Stairs            Wheelchair Mobility    Modified Rankin (Stroke Patients Only)       Balance Overall balance assessment: Needs assistance Sitting-balance support: No upper  extremity supported;Feet supported Sitting balance-Leahy Scale: Fair     Standing balance support: Bilateral upper extremity supported;During functional activity Standing balance-Leahy Scale: Poor Standing balance comment: reliant on RW                             Pertinent Vitals/Pain Pain Assessment: Faces Faces Pain Scale: Hurts little more Pain Location: generalized with mobility Pain Descriptors / Indicators: Grimacing;Guarding;Moaning Pain Intervention(s): Monitored during session;Limited activity within patient's tolerance    Home Living Family/patient expects to be discharged to:: Private residence Living Arrangements: Children(son) Available Help at Discharge: Family;Available PRN/intermittently(son works during the day) Type of Home: Apartment Home Access: Stairs to enter Entrance Stairs-Rails: None Entrance Stairs-Number of Steps: 3 Home Layout: One level Home Equipment: Walker - 4 wheels;Walker - 2 wheels Additional Comments: Information per chart and granddaughter. Granddaughter unsure of home set-up. Pt is a poor historian.    Prior Function Level of Independence: Needs assistance   Gait / Transfers Assistance Needed: Walks with rollator per family  ADL's / Homemaking Assistance Needed: Granddaughter unsure but reports pt likely requiring assist.   Comments: Per granddaughter, pt ambulates with rollator and likely requires assistance for ADL but she is unsure.      Hand Dominance   Dominant Hand: Right    Extremity/Trunk Assessment   Upper Extremity Assessment Upper Extremity Assessment: Defer to OT evaluation    Lower Extremity Assessment Lower Extremity Assessment: Generalized weakness    Cervical / Trunk Assessment Cervical / Trunk Assessment: Kyphotic  Communication   Communication:  No difficulties  Cognition Arousal/Alertness: Awake/alert Behavior During Therapy: Anxious Overall Cognitive Status: History of cognitive impairments  - at baseline                                 General Comments: Pt oriented to self only. Following one step commands with increased time.      General Comments General comments (skin integrity, edema, etc.): Granddaughter present at end of session.    Exercises     Assessment/Plan    PT Assessment Patient needs continued PT services  PT Problem List Decreased strength;Decreased mobility;Decreased safety awareness;Decreased knowledge of precautions;Decreased activity tolerance;Decreased cognition;Pain;Decreased balance       PT Treatment Interventions Therapeutic activities;Gait training;Therapeutic exercise;Patient/family education;Balance training;Functional mobility training    PT Goals (Current goals can be found in the Care Plan section)  Acute Rehab PT Goals Patient Stated Goal: not stated PT Goal Formulation: Patient unable to participate in goal setting Time For Goal Achievement: 12/05/17 Potential to Achieve Goals: Fair    Frequency Min 2X/week   Barriers to discharge        Co-evaluation               AM-PAC PT "6 Clicks" Daily Activity  Outcome Measure Difficulty turning over in bed (including adjusting bedclothes, sheets and blankets)?: A Lot Difficulty moving from lying on back to sitting on the side of the bed? : Unable Difficulty sitting down on and standing up from a chair with arms (e.g., wheelchair, bedside commode, etc,.)?: Unable Help needed moving to and from a bed to chair (including a wheelchair)?: A Lot Help needed walking in hospital room?: Total Help needed climbing 3-5 steps with a railing? : Total 6 Click Score: 8    End of Session Equipment Utilized During Treatment: Gait belt Activity Tolerance: Patient tolerated treatment well Patient left: in chair;with chair alarm set;with call bell/phone within reach;with family/visitor present   PT Visit Diagnosis: Other abnormalities of gait and mobility (R26.89);Difficulty in  walking, not elsewhere classified (R26.2);Muscle weakness (generalized) (M62.81)    Time: 1610-9604 PT Time Calculation (min) (ACUTE ONLY): 14 min   Charges:   PT Evaluation $PT Eval Moderate Complexity: 1 Mod     PT G Codes:        Aida Raider, PT  Office # [phone] Pager [phone]   Ilda Foil 11/21/2017, 1:43 PM

## 2017-11-21 NOTE — Progress Notes (Signed)
   Subjective: Patient has no complaint when seen this morning.  Patient is oriented to just to her name which is her baseline.  Objective:  Vital signs in last 24 hours: Vitals:   11/20/17 1700 11/20/17 1800 11/20/17 1935 11/21/17 0551  BP: 111/69  (!) 148/75 (!) 116/102  Pulse: 93 76 80 75  Resp: (!) 22 18 20 16   Temp:   98.2 F (36.8 C) (!) 97.5 F (36.4 C)  TempSrc:   Oral Oral  SpO2: 99% 98% 100% 93%  Weight:   177 lb 7.5 oz (80.5 kg)   Height:       General.  Well-developed pleasant elderly lady, oriented only to her name, in no acute distress. Lungs.  Clear bilaterally, normal work of breathing. CV.  Regular rate and rhythm. Abdomen.  Soft, nontender, bowel sounds positive.  Marked erythema around her pannus and groin area. Extremities.  No edema, pulses intact. Skin.  Multiple skin abrasions covered with bandage on upper extremities, multiple abrasions on dorsal surface of toes bilaterally.   Assessment/Plan:  77 year old lady with advanced dementia who was found on ground by a neighbor.  Found to have UTI.  Urosepsis: Remained afebrile with no leukocytosis.  Lactic acidosis resolved.  Urine culture is growing E. Coli, culture done in February appears similar to her previous 2019. Blood culture results are pending. -We will continue ceftriaxone while in hospital. -Antibiotics will be adjusted according to sensitivity results.  A. Fib.  Currently in sinus rhythm. -Continue Eliquis and home dose of metoprolol.  Advanced dementia with history of frequent falls.  Patient with very limited support because of his son's job.  She is unsafe to left alone and requires 24-hour assistance.  PCP tried to place her on a PACE program with no success. PT/OT evaluation pending. Talked with the social worker to find a safe discharge, according to her she can find a temporary placement but in order to get her to a permanent memory unit his son will need to fill out a whole bunch of  paperwork and that will take few weeks.  Social worker to contact her son and start the process, with the hope that it will be completed before her temporary SNF placement expires.  Hypertension.  Mostly normotensive. Continuing home dose of metoprolol.   Dispo: Anticipated discharge in approximately 1-2 day(s).  Depending on finding a safe discharge place.  Arnetha CourserAmin, Toretto Tingler, MD 11/21/2017, 12:31 PM Pager: [phone]

## 2017-11-21 NOTE — Progress Notes (Signed)
  Date: 11/21/2017  Patient name: Lisa Haynes  Medical record number: [id number]  Date of birth: Apr 14, 1941   I have seen and evaluated Lisa Haynes and discussed their care with the Residency Team.  In brief, patient is a 77 year old female with a past medical history of dementia and paroxysmal A. fib on anticoagulation who presented with a fall at home.   Patient is unable to provide a history at this time so history was obtained from the chart.  Per chart, patient's son saw her the night prior to admission and left for work the next day.  Yesterday the patient's neighbors were coming home and noted that the patient was face down in her yard and called EMS.  It was unclear how long the patient had been down.  EMS noted the patient to be in atrial fibrillation with RVR and brought her to the ED for further evaluation.  Patient denies any new complaints today and is oriented only to self which is her baseline.  PMHx, Fam Hx, and/or Soc Hx : As per resident admit note  Vitals:   11/20/17 1935 11/21/17 0551  BP: (!) 148/75 (!) 116/102  Pulse: 80 75  Resp: 20 16  Temp: 98.2 F (36.8 C) (!) 97.5 F (36.4 C)  SpO2: 100% 93%   General: Awake, alert, oriented x1, NAD CVS: Regular rate and rhythm, normal heart sounds Lungs: CTA bilaterally Abdomen: Soft, nontender, nondistended, normoactive bowel sounds Extremities: No edema noted, right forearm bandaged, bilateral feet with abrasions noted  Assessment and Plan: I have seen and evaluated the patient as outlined above. I agree with the formulated Assessment and Plan as detailed in the residents' note, with the following changes:   1.  Mechanical fall versus syncope: -Patient was found down outside her house by her neighbors and is unclear how long she had been there.  Patient is unable to provide a history secondary to her dementia and her fall was unwitnessed.  It is possible that patient was weak from an underlying urinary tract  infection and was outside in the heat and fell down.  Patient also was noted to be in A. fib with RVR and was dehydrated and may have passed out secondary to this. -Patient now in normal sinus rhythm -We will continue with her metoprolol and Eliquis -Telemetry reviewed.  No other arrhythmia noted -Patient was noted to have a positive UA with urine culture showing greater than 100,000 gram-negative rods.  We will follow-up culture and sensitivities -Continue ceftriaxone for now for likely UTI -AKI most likely prerenal in nature and is now resolved with IV fluids -Patient did have a mild lactic acidosis on admission which has since resolved -We will follow blood cultures -PT/OT to evaluate the patient.   -I am concerned that the patient cannot go back home as she is unable to care for herself at home while her son is at work.  She may need placement in a memory care facility.  We will obtain social work consult and discuss this with her son.  Earl LagosNarendra, Antawn Sison, MD 7/22/201911:30 AM

## 2017-11-21 NOTE — Discharge Instructions (Signed)

## 2017-11-21 NOTE — Evaluation (Signed)
Occupational Therapy Evaluation Patient Details Name: Lisa Haynes MRN: [id number] DOB: 02-01-1941 Today's Date: 11/21/2017    History of Present Illness Pt is a 77 y.o. female with history of dementia, depression, GERD, high cholesterol, hypertension, osteoarthritis, osteoporosis, paroxysmal atrial fibrillation, and urinary incontinence. Pt was found by neighbors face down in her yard. Admitted with possible syncope and UTI.   Clinical Impression   PTA, pt was utilizing rollator for mobility and living with her son. Pt's granddaughter present during session and states that she believes pt has been requiring assistance for ADL participation. Pt has a history of cognitive impairements at baseline and is oriented to self only. Granddaughter reports that pt is functioning at or near baseline. Pt currently requiring mod-max assist for all ADL participation and functional mobility. She was grimacing with sit<>stand initially but reports no pain. At current functional level, pt requires hands on assistance for all ADL participation to ensure safety. Currently recommend SNF post-acute D/C unless family is able to provide 24 hour hands on assistance. If family is able to provide this level of assistance, would recommend follow-up with home health OT.    Follow Up Recommendations  SNF;Supervision/Assistance - 24 hour(would benefit from memory care)    Equipment Recommendations  Other (comment)(TBD)    Recommendations for Other Services       Precautions / Restrictions Precautions Precautions: Fall Precaution Comments: Pt with dementia Restrictions Weight Bearing Restrictions: No      Mobility Bed Mobility               General bed mobility comments: OOB in recliner on my arrival.   Transfers Overall transfer level: Needs assistance Equipment used: Rolling walker (2 wheeled) Transfers: Sit to/from Stand Sit to Stand: Mod assist         General transfer comment: Two sit<>stand  transfers this session. Pt able to power up to standing position with mod assist initially but with grimacing and becoming tearful. Pt denies pain. Provided encouragement and pt able to stand with mod assist and proceed with mobility on second attempt.     Balance Overall balance assessment: Needs assistance Sitting-balance support: No upper extremity supported;Feet supported;Feet unsupported Sitting balance-Leahy Scale: Fair     Standing balance support: Bilateral upper extremity supported;No upper extremity supported;During functional activity Standing balance-Leahy Scale: Poor Standing balance comment: Relies on UE support                           ADL either performed or assessed with clinical judgement   ADL Overall ADL's : Needs assistance/impaired Eating/Feeding: Minimal assistance;Sitting   Grooming: Moderate assistance;Sitting   Upper Body Bathing: Moderate assistance;Sitting   Lower Body Bathing: Maximal assistance;Sit to/from stand   Upper Body Dressing : Moderate assistance;Sitting   Lower Body Dressing: Maximal assistance;Sit to/from stand   Toilet Transfer: Moderate assistance;Ambulation;RW Toilet Transfer Details (indicate cue type and reason): Very short distance ambulation.  Toileting- Clothing Manipulation and Hygiene: Maximal assistance;Sit to/from stand       Functional mobility during ADLs: Moderate assistance;Rolling walker(ambulating approximately 5 feet) General ADL Comments: Pt tearful on first attempt to stand and grimacing. Pt able to ambulate in room minimal distance with moderate assistance. She needs assistance hands on with all ADL.      Vision   Additional Comments: Appears in-tact. She makes eye contact and tracks therapist around room well.      Perception     Praxis  Pertinent Vitals/Pain Pain Assessment: Faces Faces Pain Scale: Hurts little more Pain Location: Pt grimacing/crying with standing but verbalizes no pain.   Pain Descriptors / Indicators: Crying;Grimacing Pain Intervention(s): Limited activity within patient's tolerance;Monitored during session;Repositioned     Hand Dominance Right   Extremity/Trunk Assessment Upper Extremity Assessment Upper Extremity Assessment: Generalized weakness   Lower Extremity Assessment Lower Extremity Assessment: Generalized weakness       Communication Communication Communication: No difficulties   Cognition Arousal/Alertness: Awake/alert Behavior During Therapy: Anxious Overall Cognitive Status: History of cognitive impairments - at baseline                                 General Comments: Per granddaughter, cognition is consistent with baseline. Pt oriented to self only. Reports that her granddaughter is her daughter and then marveling at that she was not able to remember that. Pt able to follow commands with increased eye contact and increased time.    General Comments  Granddaughter present and engaged in session.     Exercises     Shoulder Instructions      Home Living Family/patient expects to be discharged to:: Private residence Living Arrangements: Children Available Help at Discharge: Family;Available PRN/intermittently(per chart, son works during the day)               Bathroom Shower/Tub: Tub/shower unit(per granddaughter)         Home Equipment: Environmental consultant - 4 wheels   Additional Comments: Information per chart and granddaughter. Granddaughter unsure of home set-up.       Prior Functioning/Environment Level of Independence: Needs assistance  Gait / Transfers Assistance Needed: Walks with rollator per family ADL's / Homemaking Assistance Needed: Granddaughter unsure but reports pt likely requiring assist.    Comments: Per granddaughter, pt ambulates with rollator and likely requires assistance for ADL but she is unsure.         OT Problem List: Decreased strength;Decreased activity tolerance;Impaired  balance (sitting and/or standing);Decreased safety awareness;Decreased cognition;Decreased knowledge of use of DME or AE;Decreased knowledge of precautions;Pain      OT Treatment/Interventions: Self-care/ADL training;Therapeutic exercise;Energy conservation;DME and/or AE instruction;Therapeutic activities;Cognitive remediation/compensation;Patient/family education;Balance training    OT Goals(Current goals can be found in the care plan section) Acute Rehab OT Goals Patient Stated Goal: to rest  OT Goal Formulation: With patient/family Time For Goal Achievement: 12/05/17 Potential to Achieve Goals: Fair ADL Goals Pt Will Perform Grooming: with supervision;with caregiver independent in assisting;standing Pt Will Perform Lower Body Dressing: with min assist;sit to/from stand;with caregiver independent in assisting Pt Will Transfer to Toilet: ambulating;with supervision(with caregiver independent in assisting) Pt Will Perform Toileting - Clothing Manipulation and hygiene: with supervision;with caregiver independent in assisting;sit to/from stand  OT Frequency: Min 2X/week   Barriers to D/C: Decreased caregiver support  Son unable to provide 24/7 assistance       Co-evaluation              AM-PAC PT "6 Clicks" Daily Activity     Outcome Measure Help from another person eating meals?: A Little Help from another person taking care of personal grooming?: A Lot Help from another person toileting, which includes using toliet, bedpan, or urinal?: A Lot Help from another person bathing (including washing, rinsing, drying)?: A Lot Help from another person to put on and taking off regular upper body clothing?: A Lot Help from another person to put on and taking off regular lower body  clothing?: A Lot 6 Click Score: 13   End of Session Equipment Utilized During Treatment: Gait belt;Rolling walker  Activity Tolerance: Patient tolerated treatment well Patient left: in chair;with call  bell/phone within reach;with chair alarm set;with family/visitor present  OT Visit Diagnosis: Other abnormalities of gait and mobility (R26.89);Muscle weakness (generalized) (M62.81)                Time: 4098-1191 OT Time Calculation (min): 13 min Charges:  OT General Charges $OT Visit: 1 Visit OT Evaluation $OT Eval Moderate Complexity: 1 Mod G-Codes:     Doristine Section, MS OTR/L  Pager: [phone]   Lisa Haynes A Wendee Hata 11/21/2017, 1:36 PM

## 2017-11-22 DIAGNOSIS — I4891 Unspecified atrial fibrillation: Secondary | ICD-10-CM

## 2017-11-22 DIAGNOSIS — F329 Major depressive disorder, single episode, unspecified: Secondary | ICD-10-CM

## 2017-11-22 DIAGNOSIS — D649 Anemia, unspecified: Secondary | ICD-10-CM

## 2017-11-22 LAB — CBC
HEMATOCRIT: 39.9 % (ref 36.0–46.0)
HEMOGLOBIN: 12.2 g/dL (ref 12.0–15.0)
MCH: 29.9 pg (ref 26.0–34.0)
MCHC: 30.6 g/dL (ref 30.0–36.0)
MCV: 97.8 fL (ref 78.0–100.0)
Platelets: 142 10*3/uL — ABNORMAL LOW (ref 150–400)
RBC: 4.08 MIL/uL (ref 3.87–5.11)
RDW: 13.6 % (ref 11.5–15.5)
WBC: 8.5 10*3/uL (ref 4.0–10.5)

## 2017-11-22 LAB — URINE CULTURE: Culture: >=100000 — AB

## 2017-11-22 LAB — GLUCOSE, CAPILLARY: GLUCOSE-CAPILLARY: 88 mg/dL (ref 70–99)

## 2017-11-22 MED ORDER — POTASSIUM CHLORIDE CRYS ER 20 MEQ PO TBCR
40.0000 meq | EXTENDED_RELEASE_TABLET | Freq: Two times a day (BID) | ORAL | Status: DC
  Administered 2017-11-22 – 2017-11-25 (×7): 40 meq via ORAL
  Filled 2017-11-22 (×7): qty 2

## 2017-11-22 NOTE — Progress Notes (Signed)
Patient's son. Cleda Mccreedyddie Barkett called to check on the patient's status and left his phone number [phone]

## 2017-11-22 NOTE — Progress Notes (Signed)
Internal Medicine Attending Note:  I saw and examined the patient on the day of discharge. I reviewed and agree with the discharge summary written by the house staff.  Patient feels well today with no new complaints.  Patient is only oriented to person which appears to be her baseline.  Patient was initially admitted after being found on the ground by her neighbors and is found to have a UTI.  We will continue with IV ceftriaxone today and transition the patient to oral Keflex tomorrow. PT/OT evaluation appreciated. Patient will need placement in SNF.  She will eventually need to be placed in long-term memory care given her progressively worsening dementia.  No further work-up at this time.  Patient is stable for discharge to SNF once bed is available.

## 2017-11-22 NOTE — Social Work (Addendum)
Have attempted x2 to reach son [name] who pt lives with, home number does not work. Left a HIPPA compliant message at [phone].  Await return call. Pt has SNF offers, will require insurance auth once SNF has been selected.   1:42pm- Another attempt to call son, no answer. HIPPA compliant voice message left again. Attempted to call Drinda Buttsnnette who is listed as a niece on facesheet, she states that she does not have an updated number for [facility] and is unable to get in touch with him. CSW then called pt other son listed Harvie HeckRandy- Harvie HeckRandy is currently on a trip out of the state for work. Pt son states that he does not have contact with his brother [name]. He has attempted to stop by pt's home to visit her but is never able to get anyone to answer door.   Located alternate phone number in the chart for [name]- [phone]  CSW will attempt to call son [name] again, if no answer will dispatch [employer] for wellness check.   2:01pm- CSW spoke with pt son [name] at number listed above, pt son states he will be by to look at offers and will select a choice. Pt son understands that there needs to be a decision made relatively quickly give that pt is medically stable.   Will leave packet of offers for son to review, pt son knows to f/u with CSW. If no response by morning CSW will again f/u with son.   Doy HutchingIsabel H Nilda Keathley, LCSWA [facility] Clinical Social Work [phone]

## 2017-11-22 NOTE — Discharge Summary (Addendum)
Name: Lisa Haynes MRN: [id number] DOB: Jun 27, 1940 77 y.o. PCP: Lisa PontBlum, Nina, MD  Date of Admission: 11/20/2017  2:05 PM Date of Discharge: 11/25/2017 Attending Physician: Earl LagosNarendra, Nischal, MD  Discharge Diagnosis: 1. Syncope secondary to urosepsis and tachyarrhythmia  Discharge Medications: Allergies as of 11/25/2017      Reactions   Morphine And Related Other (See Comments)   Drives patient "out of" her "mind"      Medication List    TAKE these medications   alendronate 70 MG tablet Commonly known as:  FOSAMAX Take 1 tablet (70 mg total) by mouth every 7 (seven) days. Take with a full glass of water on an empty stomach.   apixaban 5 MG Tabs tablet Commonly known as:  ELIQUIS Take 1 tablet (5 mg total) by mouth 2 (two) times daily.   Calcium Carbonate-Vitamin D 600-400 MG-UNIT tablet Commonly known as:  CALCARB 600/D Take 1 tablet by mouth 2 (two) times daily with a meal.   cholestyramine 4 g packet Commonly known as:  QUESTRAN MIX AND TAKE ONE PACKET BY MOUTH AS NEEDED   metoprolol tartrate 25 MG tablet Commonly known as:  LOPRESSOR Take 0.5 tablets (12.5 mg total) by mouth 2 (two) times daily.   multivitamin with minerals tablet Take 1 tablet by mouth daily.   nitroGLYCERIN 0.4 MG SL tablet Commonly known as:  NITROSTAT DISSOLVE 1 TABLET UNDER THE TONGUE EVERY 5 MINUTES AS NEEDED FOR CHEST PAIN   nystatin cream Commonly known as:  MYCOSTATIN Apply topically 2 (two) times daily.   sertraline 50 MG tablet Commonly known as:  ZOLOFT TAKE 1 TABLET BY MOUTH AT BEDTIME What changed:    how much to take  how to take this  when to take this   simvastatin 10 MG tablet Commonly known as:  ZOCOR Take 1 tablet (10 mg total) by mouth daily.       Disposition and follow-up:   L[facility] Hay[facility] in  Stable condition.  At the hospital follow up visit please address:  1.  Fall work up did not reveal any brain  injury or fractures. How ever patient has dementia, with Hx of frequent falls. She is on Eliquis at home and need full time nursing, memory care and assisting with her meds. Will discharge to SNF and meanwhile his son find a long term nursing facility.  2- patient has Hx of Afib. Has been on Eliquis. Initially, found to be A-FIB with RVR when EMS arrived. How ever her rate has been controled with home dose of medications during admission. Please assist her for taking correct dose of Eliquis and Metoprolol daily and observe for any bleeding  3. Patient has some possible candida rash in her right groin and lower abdomen, that improved with Nystatin cream. Please assist her continue applying it on the area  2.  Labs / imaging needed at time of follow-up: U/A, CMP  3.  Pending labs/ test needing follow-up: None  Follow-up Appointments: Contact information for after-discharge care    Destination    [facility] Preferred SNF .   Service:  Skilled Nursing Contact information: [address] [place] [phone]              Hospital Course by problem list: 1. Lisa Haynes is a 77 year old female with severe dementia, paroxysmal A. fib (on Eliquis) presented to the emergency department after she was found facedown in the ER  by neighbors.  It was unclear how long the patient had been there.  She was able to go back to her home.  Neighbor called EMS.  Patient had A. fib with RVR initially and brought to the hospital for further evaluation.  Here, head CT scan did not show any brain injury.  Further trauma work-up, was negative for fracture or injuries.  She found to have elevated lactic acid and the UTI, treated with IV ceftriaxone. BC: came back negative and UC came back positive for Pen sensitive Ecoli. She has been afebrile with no leukocytosis lactic acid improved with IV fluid.  Patient has been on Eliquis for A. Fib, rate control by home  dose of metoprolol.  PT OT evaluation recommended SNF and memory care for 24 hours assisting.  She is asymptomatic and hemodynamically stable. We discharge her and she will be placed in SNF and the process for finding a permanent facility may be began by her son.    Discharge Vitals:   BP (!) 142/73 (BP Location: Right Leg)   Pulse (!) 51   Temp 98 F (36.7 C) (Oral)   Resp 20   Ht 5\' 1"  (1.549 m)   Wt 177 lb 7.5 oz (80.5 kg)   SpO2 99%   BMI 33.53 kg/m   Pertinent Labs, Studies, and Procedures:  CBC Latest Ref Rng & Units 11/24/2017 11/23/2017 11/22/2017  WBC 4.0 - 10.5 K/uL 9.0 7.8 8.5  Hemoglobin 12.0 - 15.0 g/dL 13.2 11.8(L) 12.2  Hematocrit 36.0 - 46.0 % 41.1 38.2 39.9  Platelets 150 - 400 K/uL 169 134(L) 142(L)   Lactic Acid, Venous    Component Value Date/Time   LATICACIDVEN 1.2 11/20/2017 1820    CMP Latest Ref Rng & Units 11/23/2017 11/21/2017 11/20/2017  Glucose 70 - 99 mg/dL 93 440(N) 027(O)  BUN 8 - 23 mg/dL 14 53(G) 64(Q)  Creatinine 0.44 - 1.00 mg/dL 0.34 7.42 5.95(G)  Sodium 135 - 145 mmol/L 140 143 137  Potassium 3.5 - 5.1 mmol/L 4.4 3.4(L) 4.4  Chloride 98 - 111 mmol/L 111 113(H) 105  CO2 22 - 32 mmol/L 24 22 -  Calcium 8.9 - 10.3 mg/dL 3.8(V) 5.6(E) -  Total Protein 6.5 - 8.1 g/dL - 5.6(L) -  Total Bilirubin 0.3 - 1.2 mg/dL - 1.1 -  Alkaline Phos 38 - 126 U/L - 44 -  AST 15 - 41 U/L - 16 -  ALT 0 - 44 U/L - 9 -   Discharge Instructions: Discharge Instructions    Call MD for:  persistant dizziness or light-headedness   Complete by:  As directed    Diet - low sodium heart healthy   Complete by:  As directed    Diet - low sodium heart healthy   Complete by:  As directed    Discharge instructions   Complete by:  As directed    We are glad that Lisa Haynes is doing better. We recommend nursing facility for her. Please assist her taking Keflex as instructed and follow up with PCP.   Discharge instructions   Complete by:  As directed    It was pleasure to  take care of Lisa Haynes at hospital and we are glad that she feels better. No change in medication applied at this time except adding Nystatin cream. Please let us know if you have any question.   Dr. Teola Bradley, MD   Increase activity slowly   Complete by:  As directed    Increase activity  slowly   Complete by:  As directed       Signed: Chevis Pretty, MD 11/25/2017, 1:44 PM   Pager: [phone]

## 2017-11-22 NOTE — Progress Notes (Signed)
   Subjective: [name] is doing good. She tries to answer our questions, how ever she is not clear and can not address our questions. Just oriented to herself and only mentions his son's name when we ask about that.   Objective:  Vital signs in last 24 hours: Vitals:   11/21/17 0551 11/21/17 1616 11/21/17 2200 11/22/17 0507  BP: (!) 116/102 (!) 145/86 (!) 143/70 (!) 127/56  Pulse: 75 64 68 64  Resp: 16 18 18 18   Temp: (!) 97.5 F (36.4 C) 98.2 F (36.8 C) 98.4 F (36.9 C) 98.3 F (36.8 C)  TempSrc: Oral Oral Oral Oral  SpO2: 93% 100% 98% 100%  Weight:      Height:       Ph/E: Pleasant lady, in no acute distress. Alert but not oriented to time, place and persons (except herself and his son's name) Lungs are clear bilaterally CV: Regular today, nl S1S2. No murmur Abdomen: soft and non tender. BS are normal Extremities: Rt forearm is wrapped with bandage. Nl ROM. Superficial wound at his left toes. No LE edema. Pulses are Nl bilaterally.  Assessment/Plan: 77 year old lady with advanced dementia who was found on ground by a neighbor. Found to have UTI.  -Urosepsis: Patient is stable. Still afebrile, no leukocytosis.  He is on IV ceftriaxone 2 g daily(started on 7/21). Urine culture came back positive for Pen sensitive E. Coli. Blood culture has shown no growth.  -Continue IV ceftriaxone 2 g today -Will discharge today and switch to ampicillin from tomorrow  -Anemia: Likely dilutional after IVF. CBC today came back normal with HB elevated to 12.2  -Afib: Tele shows sinus rhythm currently. -Continue Eliquis and home dose of metoprolol  -Dementia and depression: -Tinea Zoloft -PT OT evaluation is done and recommended SNF, and memory care.  -[facility]. Will talk to son about patient needs permanent nursing facility.  And he needs to start the process before SNF placement expires.  Dispo: Anticipated discharge in approximately  today.  Chevis PrettyMasoudi, Tayvin Preslar, MD 11/22/2017, 10:41 AM Pager: [phone]

## 2017-11-23 ENCOUNTER — Other Ambulatory Visit: Payer: Self-pay

## 2017-11-23 LAB — BASIC METABOLIC PANEL
Anion gap: 5 (ref 5–15)
BUN: 14 mg/dL (ref 8–23)
CALCIUM: 8.6 mg/dL — AB (ref 8.9–10.3)
CO2: 24 mmol/L (ref 22–32)
CREATININE: 0.85 mg/dL (ref 0.44–1.00)
Chloride: 111 mmol/L (ref 98–111)
GFR calc Af Amer: >60 mL/min (ref >60)
Glucose, Bld: 93 mg/dL (ref 70–99)
Potassium: 4.4 mmol/L (ref 3.5–5.1)
Sodium: 140 mmol/L (ref 135–145)

## 2017-11-23 LAB — CBC
HCT: 38.2 % (ref 36.0–46.0)
Hemoglobin: 11.8 g/dL — ABNORMAL LOW (ref 12.0–15.0)
MCH: 29.6 pg (ref 26.0–34.0)
MCHC: 30.9 g/dL (ref 30.0–36.0)
MCV: 95.7 fL (ref 78.0–100.0)
PLATELETS: 134 10*3/uL — AB (ref 150–400)
RBC: 3.99 MIL/uL (ref 3.87–5.11)
RDW: 13.5 % (ref 11.5–15.5)
WBC: 7.8 10*3/uL (ref 4.0–10.5)

## 2017-11-23 LAB — GLUCOSE, CAPILLARY: GLUCOSE-CAPILLARY: 91 mg/dL (ref 70–99)

## 2017-11-23 MED ORDER — CEPHALEXIN 500 MG PO CAPS
500.0000 mg | ORAL_CAPSULE | Freq: Two times a day (BID) | ORAL | Status: DC
  Administered 2017-11-23 – 2017-11-25 (×5): 500 mg via ORAL
  Filled 2017-11-23 (×5): qty 1

## 2017-11-23 NOTE — Social Work (Signed)
CSW still awaiting insurance authorization, [facility] is contacting pt son to complete admissions paperwork.  Hopeful for insurance auth and d/c tomorrow.   Lisa Haynes, LCSWA [facility] Clinical Social Work [phone]

## 2017-11-23 NOTE — Progress Notes (Signed)
Internal Medicine Attending:   I saw and examined the patient. I reviewed the resident's note and I agree with the resident's findings and plan as documented in the resident's note.  Patient feels well today with no new complaints.  She remains confused at baseline.  Patient was initially admitted with a fall from home and is found to have a urinary tract infection.  We will complete a 5-day course of antibiotics.  Will DC IV ceftriaxone today and transition her to Keflex to complete her course.  No further work-up at this time.  Patient awaiting placement in SNF.  Patient stable for discharge to SNF once bed is available.

## 2017-11-23 NOTE — Social Work (Addendum)
Attempt to reach pt son Lisa Haynes this morning unsuccessful.   CSW will attempt again, if unable other family members will have to be consult for SNF decision.  11:06am- CSW reached pt son Lisa Haynes, he states that he cannot come by the hospital. "I need my job it's the only thing I've got." CSW acknowledged importance of getting to work but that we were unable to hold any longer for a decision because of pt's medical stability and need for insurance authorization to begin. Pt son states "can she go to [facility]?" CSW states that it is an option, we will contact [facility] for availability and hopefully initiate insurance authorization.    Continuing to follow, have updated MD care team also, they are aware of challenges with placement choice and need for insurance auth.   12:22pm- Insurance Berkley Harveyauth has been started [facility]. Continuing to follow.    Doy HutchingIsabel H Sharlynn Haynes, LCSWA [facility] Clinical Social Work [phone]

## 2017-11-23 NOTE — Progress Notes (Addendum)
   Subjective: [name] was doing good when seen this morning.  Has walked around with assistant.  Does not have any complain.   Objective:  Vital signs in last 24 hours: Vitals:   11/22/17 1451 11/22/17 2259 11/23/17 0538 11/23/17 0900  BP: (!) 133/109 (!) 143/111 (!) 147/67   Pulse: (!) 52 60 (!) 55 (!) 54  Resp: 16 20 20    Temp: 97.6 F (36.4 C) 98.6 F (37 C) 97.6 F (36.4 C)   TempSrc: Oral Oral Oral   SpO2: 98% 99% 100%   Weight:      Height:       Ph/E: Pleasant lady, sitting on the chair in no acute distress.  Only oriented to self. CV: Regular today, S1-S2 are normal, no murmur Lungs are clear bilaterally Abdomen: Is soft and nontender with normal bowel sounds. Extremities right forearm is wrapped.  Range of motion is normal.  There are superficial wounds at left toes.  No sign of infection.  No active bleeding.  Pulses are normal bilaterally.  Assessment/Plan:  77 year old lady with advanced dementia who admitted due to fall. (She was found on ground by a neighbor).Found to have urosepsis in ED.  -Urosepsis: Patient is stable. Still afebrile, no leukocytosis.  Gaylyn RongHa got IV ceftriaxone 2 for 3 days (started on 7/21). Urine culture came back positive for Pen sensitive E. Coli. Blood culture has shown no growth. She can be discharged after insurance authorization and SNF placing.  -Stop IV ceftriaxone. Continue Tx with Keflex.  -Afib: Tele shows sinus rhythm currently. -Continue Eliquis and home dose of metoprolol -Stop Tele  -Dementia and depression: Patient needs memory care and placing in long term nursing  facility. Waiting for insurance authorization. Social worker is on board and has contacted son to since he needs to confirm and pick from available nursing facility.   -Continue Zoloft -Follow up for discharge to SNF     Dispo: Patient .   Chevis PrettyMasoudi, Jaeley Wiker, MD 11/23/2017, 11:53 AM Pager: [phone]

## 2017-11-23 NOTE — Care Management Important Message (Signed)
Important Message  Patient Details  Name: Lucilla Edinatsy J Ishikawa MRN: [id number] Date of Birth: 1940/10/08   Medicare Important Message Given:  No    Dorena Bodoris Lindie Roberson 11/23/2017, 4:30 PM  Patient requested that a unsigned copy be left

## 2017-11-23 NOTE — Progress Notes (Signed)
MEDICATION RELATED NOTE    Pharmacy Re:  Home Meds  Assessment: Pharmacy has attempted to complete this medication history for this patient.  We have been able to obtain the fill history for her but family can only confirm that she takes eliquis.  Noted plans for discharge to [facility].  Plan:  I have marked her history complete, please resume those medications you feel most appropriate for her care at discharge.   Medications:  Medications Prior to Admission  Medication Sig Dispense Refill Last Dose  . apixaban (ELIQUIS) 5 MG TABS tablet Take 1 tablet (5 mg total) by mouth 2 (two) times daily. 180 tablet 3 unk  . metoprolol tartrate (LOPRESSOR) 25 MG tablet Take 0.5 tablets (12.5 mg total) by mouth 2 (two) times daily. 90 tablet 2 06/30/2017 at Unknown time  . simvastatin (ZOCOR) 10 MG tablet Take 1 tablet (10 mg total) by mouth daily. 90 tablet 11   . alendronate (FOSAMAX) 70 MG tablet Take 1 tablet (70 mg total) by mouth every 7 (seven) days. Take with a full glass of water on an empty stomach. 4 tablet 11 Past Week at Unknown time  . Calcium Carbonate-Vitamin D (CALCARB 600/D) 600-400 MG-UNIT tablet Take 1 tablet by mouth 2 (two) times daily with a meal. 30 tablet 3 06/30/2017 at Unknown time  . cholestyramine (QUESTRAN) 4 g packet MIX AND TAKE ONE PACKET BY MOUTH AS NEEDED 30 packet 6   . Multiple Vitamins-Minerals (MULTIVITAMIN WITH MINERALS) tablet Take 1 tablet by mouth daily.   06/30/2017 at Unknown time  . nitroGLYCERIN (NITROSTAT) 0.4 MG SL tablet DISSOLVE 1 TABLET UNDER THE TONGUE EVERY 5 MINUTES AS NEEDED FOR CHEST PAIN 30 tablet 0 Taking  . sertraline (ZOLOFT) 50 MG tablet TAKE 1 TABLET BY MOUTH AT BEDTIME (Patient taking differently: TAKE 1 TABLET (50mg ) BY MOUTH AT BEDTIME) 90 tablet 2 06/29/2017 at Unknown time     Nadara MustardNita Reyanna Baley, PharmD., MS Clinical Pharmacist Pager:  [phone] Thank you for allowing pharmacy to be part of this patients care team. 11/23/2017,11:34  AM

## 2017-11-24 LAB — CBC
HEMATOCRIT: 41.1 % (ref 36.0–46.0)
Hemoglobin: 12.6 g/dL (ref 12.0–15.0)
MCH: 29.4 pg (ref 26.0–34.0)
MCHC: 30.7 g/dL (ref 30.0–36.0)
MCV: 95.8 fL (ref 78.0–100.0)
PLATELETS: 169 10*3/uL (ref 150–400)
RBC: 4.29 MIL/uL (ref 3.87–5.11)
RDW: 13.4 % (ref 11.5–15.5)
WBC: 9 10*3/uL (ref 4.0–10.5)

## 2017-11-24 LAB — GLUCOSE, CAPILLARY: Glucose-Capillary: 84 mg/dL (ref 70–99)

## 2017-11-24 NOTE — Social Work (Signed)
CSW has discussed case with attending MD and resident. Pt auth still pending. Will follow and when authorization received will facilitate discharge.  Doy HutchingIsabel H Lubna Stegeman, LCSWA [facility] Clinical Social Work [phone]

## 2017-11-24 NOTE — Plan of Care (Signed)
  Problem: Nutrition: Goal: Adequate nutrition will be maintained Outcome: Completed/Met

## 2017-11-24 NOTE — Progress Notes (Signed)
Internal Medicine Attending:   I saw and examined the patient. I reviewed the resident's note and I agree with the resident's findings and plan as documented in the resident's note.  Patient feels well today with no new complaints.  Patient was initially admitted to the hospital status post a fall and was found to have a urinary tract infection.  Blood cultures with no growth to date.  Urine culture was growing pansensitive E. coli.  She is status post IV ceftriaxone x3 days.  Will complete a 5-day total course of antibiotics with Keflex.  No further work-up at this time.  Patient is stable for discharge to SNF once bed is available.  I believe that she will need long-term placement in memory care in the near future.

## 2017-11-24 NOTE — Progress Notes (Signed)
Physical Therapy Treatment Patient Details Name: Lisa Haynes MRN: [id number] DOB: [birth year] Today's Date: 11/24/2017    History of Present Illness Pt is a 77 y.o. female with history of dementia, depression, GERD, high cholesterol, hypertension, osteoarthritis, osteoporosis, paroxysmal atrial fibrillation, and urinary incontinence. Pt was found by neighbors face down in her yard. Admitted with possible syncope and UTI.    PT Comments    Pt remains very limited secondary to generalized weakness and cognitive deficits. Pt also limited secondary to urinary incontinence this session. She continues to require mod A x2 for safety with transfers. Pt would continue to benefit from skilled physical therapy services at this time while admitted and after d/c to address the below listed limitations in order to improve overall safety and independence with functional mobility.   Follow Up Recommendations  SNF     Equipment Recommendations  None recommended by PT    Recommendations for Other Services       Precautions / Restrictions Precautions Precautions: Fall Restrictions Weight Bearing Restrictions: No    Mobility  Bed Mobility               General bed mobility comments: pt OOB in recliner chair upon arrival  Transfers Overall transfer level: Needs assistance Equipment used: Rolling walker (2 wheeled) Transfers: Sit to/from [employer] CorporationStand;Stand Pivot Transfers Sit to Stand: Mod assist;+2 safety/equipment Stand pivot transfers: Mod assist;+2 safety/equipment;+2 physical assistance       General transfer comment: pt required multimodal cueing throughout for technique and sequencing. pt with immediate incontinence of urine upon standing, then [facility] with mod A x2  Ambulation/Gait                 Stairs             Wheelchair Mobility    Modified Rankin (Stroke Patients Only)       Balance Overall balance assessment: Needs  assistance Sitting-balance support: No upper extremity supported;Feet supported Sitting balance-Leahy Scale: Fair     Standing balance support: Bilateral upper extremity supported;During functional activity;No upper extremity supported;Single extremity supported Standing balance-Leahy Scale: Poor                              Cognition Arousal/Alertness: Awake/alert Behavior During Therapy: Anxious;Impulsive Overall Cognitive Status: History of cognitive impairments - at baseline Area of Impairment: Orientation;Memory;Following commands;Safety/judgement;Problem solving                 Orientation Level: Disoriented to;Place;Time;Situation   Memory: Decreased short-term memory;Decreased recall of precautions Following Commands: Follows one step commands with increased time Safety/Judgement: Decreased awareness of deficits;Decreased awareness of safety   Problem Solving: Difficulty sequencing;Requires verbal cues;Requires tactile cues        Exercises      General Comments        Pertinent Vitals/Pain Pain Assessment: No/denies pain    Home Living                      Prior Function            PT Goals (current goals can now be found in the care plan section) Acute Rehab PT Goals PT Goal Formulation: Patient unable to participate in goal setting Time For Goal Achievement: 12/05/17 Potential to Achieve Goals: Fair Progress towards PT goals: Progressing toward goals    Frequency    Min 2X/week      PT Plan Current  plan remains appropriate    Co-evaluation              AM-PAC PT "6 Clicks" Daily Activity  Outcome Measure  Difficulty turning over in bed (including adjusting bedclothes, sheets and blankets)?: A Lot Difficulty moving from lying on back to sitting on the side of the bed? : Unable Difficulty sitting down on and standing up from a chair with arms (e.g., wheelchair, bedside commode, etc,.)?: Unable Help needed  moving to and from a bed to chair (including a wheelchair)?: A Lot Help needed walking in hospital room?: A Lot Help needed climbing 3-5 steps with a railing? : Total 6 Click Score: 9    End of Session Equipment Utilized During Treatment: Gait belt Activity Tolerance: Patient limited by fatigue;Other (comment)(limited secondary to cognitive deficits and safety) Patient left: in chair;with call bell/phone within reach;with chair alarm set Nurse Communication: Mobility status PT Visit Diagnosis: Other abnormalities of gait and mobility (R26.89);Difficulty in walking, not elsewhere classified (R26.2);Muscle weakness (generalized) (M62.81)     Time: 5409-8119 PT Time Calculation (min) (ACUTE ONLY): 21 min  Charges:  $Therapeutic Activity: 8-22 mins                     [place], [place], [place] [phone]    Lisa Haynes Lisa Haynes 11/24/2017, 12:25 PM

## 2017-11-24 NOTE — Progress Notes (Signed)
   Subjective: [name] was doing good when saw this AM. She denies any pain. We asked her if she has talked to her son?" I don't want to" she replied. When talked to her about discharging her, she started to cry, and stated that they have token my cloths. We showed her cloths and tried to calm her down. She felt better within 1-2 minute.  Objective:  Vital signs in last 24 hours: Vitals:   11/23/17 1508 11/23/17 2132 11/23/17 2203 11/24/17 0534  BP: (!) 131/58 129/62 (!) 102/59 (!) 109/59  Pulse: (!) 54 (!) 58 62 (!) 54  Resp: 16 19  19   Temp: 97.9 F (36.6 C) 98.2 F (36.8 C)  97.8 F (36.6 C)  TempSrc: Oral Oral  Oral  SpO2: 100% 99%  95%  Weight:      Height:       Ph/E Pleasant elderly sitting in no acute distress. Oriented to her self, not to time and plcae and other peple except her sons' name. CV: RRR, no murmur Lungs: CTA bilaterally Abdomen is soft and nontender. BS are present and normal Extremities: Left IV cath is displaced and exposed. No bleeding. Left toes wounds are dry and intact. Pulses are NL bilaterally.   Assessment/Plan:  77 year old lady with advanced dementia who admitted due to fall. (She was found on ground by a neighbor).Found to have urosepsis in ED.  -Urosepsis:Urine culture came back positive forPensensitiveE. Coli.Blood culture has shown no growth.   s/p IV ceftriaxone x3 and Kefle x1day Patient isstable. Still afebrile, no leukocytosis. CBC Latest Ref Rng & Units 11/24/2017 11/23/2017 11/22/2017  WBC 4.0 - 10.5 K/uL 9.0 7.8 8.5  Hemoglobin 12.0 - 15.0 g/dL 16.112.6 11.8(L) 12.2  Hematocrit 36.0 - 46.0 % 41.1 38.2 39.9  Platelets 150 - 400 K/uL 169 134(L) 142(L)   -Continue Tx with Keflex for today. (Day 5) -Waiting for insurance to accepting SNF placing.  -Afib: Stable -Continue Eliquis and home dose of metoprolol   -Dementiaand depression:  Patient needs memory care and placing in long term nursing  facility. She got  A bed at  SNF ([facility]) per his son preference. Insurance authorization process started yesterday and will be hopefully done today -Waiting for insurance authorization to discharge to SNF  Dispo: Anticipated discharge in approximately today   Lisa Haynes, Lisa Eiland, MD 11/24/2017, 11:30 AM Pager: [phone]

## 2017-11-25 DIAGNOSIS — R2681 Unsteadiness on feet: Secondary | ICD-10-CM | POA: Diagnosis not present

## 2017-11-25 DIAGNOSIS — R4020 Unspecified coma: Secondary | ICD-10-CM | POA: Diagnosis not present

## 2017-11-25 DIAGNOSIS — Z885 Allergy status to narcotic agent status: Secondary | ICD-10-CM

## 2017-11-25 DIAGNOSIS — Z9181 History of falling: Secondary | ICD-10-CM | POA: Diagnosis not present

## 2017-11-25 DIAGNOSIS — N390 Urinary tract infection, site not specified: Secondary | ICD-10-CM | POA: Diagnosis not present

## 2017-11-25 DIAGNOSIS — Z743 Need for continuous supervision: Secondary | ICD-10-CM | POA: Diagnosis not present

## 2017-11-25 DIAGNOSIS — R260 Ataxic gait: Secondary | ICD-10-CM | POA: Diagnosis not present

## 2017-11-25 DIAGNOSIS — R21 Rash and other nonspecific skin eruption: Secondary | ICD-10-CM | POA: Diagnosis not present

## 2017-11-25 DIAGNOSIS — R55 Syncope and collapse: Secondary | ICD-10-CM | POA: Diagnosis not present

## 2017-11-25 DIAGNOSIS — F329 Major depressive disorder, single episode, unspecified: Secondary | ICD-10-CM | POA: Diagnosis not present

## 2017-11-25 DIAGNOSIS — G301 Alzheimer's disease with late onset: Secondary | ICD-10-CM | POA: Diagnosis not present

## 2017-11-25 DIAGNOSIS — M6281 Muscle weakness (generalized): Secondary | ICD-10-CM | POA: Diagnosis not present

## 2017-11-25 DIAGNOSIS — I499 Cardiac arrhythmia, unspecified: Secondary | ICD-10-CM | POA: Diagnosis not present

## 2017-11-25 DIAGNOSIS — R41841 Cognitive communication deficit: Secondary | ICD-10-CM | POA: Diagnosis not present

## 2017-11-25 DIAGNOSIS — R279 Unspecified lack of coordination: Secondary | ICD-10-CM | POA: Diagnosis not present

## 2017-11-25 DIAGNOSIS — R2689 Other abnormalities of gait and mobility: Secondary | ICD-10-CM | POA: Diagnosis not present

## 2017-11-25 DIAGNOSIS — F419 Anxiety disorder, unspecified: Secondary | ICD-10-CM | POA: Diagnosis not present

## 2017-11-25 DIAGNOSIS — M818 Other osteoporosis without current pathological fracture: Secondary | ICD-10-CM | POA: Diagnosis not present

## 2017-11-25 DIAGNOSIS — F332 Major depressive disorder, recurrent severe without psychotic features: Secondary | ICD-10-CM | POA: Diagnosis not present

## 2017-11-25 DIAGNOSIS — I4891 Unspecified atrial fibrillation: Secondary | ICD-10-CM | POA: Diagnosis not present

## 2017-11-25 DIAGNOSIS — B9620 Unspecified Escherichia coli [E. coli] as the cause of diseases classified elsewhere: Secondary | ICD-10-CM | POA: Diagnosis not present

## 2017-11-25 DIAGNOSIS — R32 Unspecified urinary incontinence: Secondary | ICD-10-CM | POA: Diagnosis not present

## 2017-11-25 DIAGNOSIS — F0390 Unspecified dementia without behavioral disturbance: Secondary | ICD-10-CM | POA: Diagnosis not present

## 2017-11-25 DIAGNOSIS — Z7901 Long term (current) use of anticoagulants: Secondary | ICD-10-CM | POA: Diagnosis not present

## 2017-11-25 DIAGNOSIS — R000 Tachycardia, unspecified: Secondary | ICD-10-CM | POA: Diagnosis not present

## 2017-11-25 DIAGNOSIS — R269 Unspecified abnormalities of gait and mobility: Secondary | ICD-10-CM | POA: Diagnosis not present

## 2017-11-25 DIAGNOSIS — I10 Essential (primary) hypertension: Secondary | ICD-10-CM | POA: Diagnosis not present

## 2017-11-25 DIAGNOSIS — H109 Unspecified conjunctivitis: Secondary | ICD-10-CM | POA: Diagnosis not present

## 2017-11-25 DIAGNOSIS — R531 Weakness: Secondary | ICD-10-CM | POA: Diagnosis not present

## 2017-11-25 DIAGNOSIS — F29 Unspecified psychosis not due to a substance or known physiological condition: Secondary | ICD-10-CM | POA: Diagnosis not present

## 2017-11-25 DIAGNOSIS — R278 Other lack of coordination: Secondary | ICD-10-CM | POA: Diagnosis not present

## 2017-11-25 DIAGNOSIS — I482 Chronic atrial fibrillation: Secondary | ICD-10-CM | POA: Diagnosis not present

## 2017-11-25 LAB — CULTURE, BLOOD (ROUTINE X 2)
Culture: NO GROWTH
Culture: NO GROWTH

## 2017-11-25 LAB — GLUCOSE, CAPILLARY
Glucose-Capillary: 90 mg/dL (ref 70–99)
Glucose-Capillary: 86 mg/dL (ref 70–99)

## 2017-11-25 MED ORDER — NYSTATIN 100000 UNIT/GM EX CREA: TOPICAL_CREAM | Freq: Two times a day (BID) | CUTANEOUS | 0 refills | Status: AC

## 2017-11-25 NOTE — Progress Notes (Signed)
   Subjective: Lisa Haynes was resting when seen today. She was doing ok. Did not have any complaint or pain today.   Objective:  Vital signs in last 24 hours: Vitals:   11/23/17 2203 11/24/17 0534 11/24/17 1506 11/25/17 0620  BP: (!) 102/59 (!) 109/59 (!) 148/61 (!) 142/73  Pulse: 62 (!) 54 60 (!) 51  Resp:  19 18 20   Temp:  97.8 F (36.6 C) 98 F (36.7 C)   TempSrc:  Oral Oral   SpO2:  95% 98% 99%  Weight:      Height:        Ph/E Pleasant elderly lady laying on the bed with no acute distress. Oriented to her self CV: RRR, no murmur Lungs: CTA bilaterally Abdomen is soft and nontender. BS are present and normal Extremities: No LE edema. Pulses are NL bilaterally.     Assessment/Plan:  77 year old lady with advanced dementia whoadmitted due to fall. Orpah Greek(Shewas found on ground by a neighbor).Found to haveurosepsis in ED.  -Urosepsis:Patient presented withPensensitiveE. Coli.Blood culture has shown no growth.  s/p IV ceftriaxone x3 and Kefle x2 day  Patient has beenstable. Still afebrile, no leukocytosis.  -Discharge to SNF with memory care.  CBC Latest Ref Rng & Units 11/24/2017 11/23/2017 11/22/2017  WBC 4.0 - 10.5 K/uL 9.0 7.8 8.5  Hemoglobin 12.0 - 15.0 g/dL 16.112.6 11.8(L) 12.2  Hematocrit 36.0 - 46.0 % 41.1 38.2 39.9  Platelets 150 - 400 K/uL 169 134(L) 142(L)   -Afib: Stable -Continue Eliquis and metoprolol at home   -Dementiaand depression: Patient needs memory care and placing in long term nursingfacility. We discharge him to SNF ([facility]) per his son preference till he plans long term nursing facility for her.  -Discharge to SNF and memory care     Dispo: Anticipated discharge in approximately today  Chevis PrettyMasoudi, Monae Topping, MD 11/25/2017, 12:59 PM Pager: [phone]

## 2017-11-25 NOTE — Progress Notes (Signed)
Internal Medicine Attending:   I saw and examined the patient. I reviewed the resident's note and I agree with the resident's findings and plan as documented in the resident's note.  Patient feels well today with no new complaints.  Patient has a history of dementia and was initially admitted to the hospital with a fall and was found to have a UTI.  Blood cultures have been negative.  Urine cultures grew pansensitive E. coli.  Patient is status post a 5-day course of antibiotics.  No further work-up at this time.  Patient stable for discharge to SNF once bed is available.  She would likely need long-term placement in memory care.

## 2017-11-25 NOTE — Progress Notes (Signed)
Patient will discharge to [facility] Anticipated discharge date: 7/26 Family notified:pt son Transportation by [facility]- scheduled for 2pm Report #: [id number]  CSW signing off.  Burna SisJenna H. Paloma Grange, LCSW Clinical Social Worker [phone]

## 2017-11-25 NOTE — Social Work (Addendum)
Pt [facility] still [facility], pt will discharge on 5 day LOG approved by CSW dept leadership. MD team aware.   When pt discharge summary and information received CSW will discharge pt.    Doy HutchingIsabel H Avyay Coger, LCSWA [facility] Clinical Social Work [phone]

## 2017-11-25 NOTE — Progress Notes (Signed)
[facility] report given to [facility] Smith LPN.

## 2017-11-28 DIAGNOSIS — F329 Major depressive disorder, single episode, unspecified: Secondary | ICD-10-CM | POA: Diagnosis not present

## 2017-11-28 DIAGNOSIS — G301 Alzheimer's disease with late onset: Secondary | ICD-10-CM | POA: Diagnosis not present

## 2017-11-28 DIAGNOSIS — N390 Urinary tract infection, site not specified: Secondary | ICD-10-CM | POA: Diagnosis not present

## 2017-11-28 DIAGNOSIS — I4891 Unspecified atrial fibrillation: Secondary | ICD-10-CM | POA: Diagnosis not present

## 2017-11-29 DIAGNOSIS — I10 Essential (primary) hypertension: Secondary | ICD-10-CM | POA: Diagnosis not present

## 2017-11-29 DIAGNOSIS — R531 Weakness: Secondary | ICD-10-CM | POA: Diagnosis not present

## 2017-11-29 DIAGNOSIS — G301 Alzheimer's disease with late onset: Secondary | ICD-10-CM | POA: Diagnosis not present

## 2017-11-29 DIAGNOSIS — N390 Urinary tract infection, site not specified: Secondary | ICD-10-CM | POA: Diagnosis not present

## 2017-11-30 ENCOUNTER — Telehealth: Payer: Self-pay | Admitting: Internal Medicine

## 2017-11-30 DIAGNOSIS — N390 Urinary tract infection, site not specified: Secondary | ICD-10-CM | POA: Diagnosis not present

## 2017-11-30 DIAGNOSIS — I4891 Unspecified atrial fibrillation: Secondary | ICD-10-CM | POA: Diagnosis not present

## 2017-11-30 DIAGNOSIS — F0390 Unspecified dementia without behavioral disturbance: Secondary | ICD-10-CM | POA: Diagnosis not present

## 2017-11-30 NOTE — Social Work (Addendum)
CSW received call from Sander RadonHeather Hicks with [facility] at [phone].   [facility] for the pt on 7/23. They were updated with where pt was discharged, but all other information will need to be requested from medical records.   APS worker aware. CSW will call and inform pt's PCP office.   Doy HutchingIsabel H Laynee Lockamy, LCSWA [facility] Clinical Social Work [phone]

## 2017-12-06 ENCOUNTER — Ambulatory Visit: Payer: Medicare HMO

## 2017-12-08 DIAGNOSIS — I4891 Unspecified atrial fibrillation: Secondary | ICD-10-CM | POA: Diagnosis not present

## 2017-12-08 DIAGNOSIS — N390 Urinary tract infection, site not specified: Secondary | ICD-10-CM | POA: Diagnosis not present

## 2017-12-08 DIAGNOSIS — R531 Weakness: Secondary | ICD-10-CM | POA: Diagnosis not present

## 2017-12-08 DIAGNOSIS — I10 Essential (primary) hypertension: Secondary | ICD-10-CM | POA: Diagnosis not present

## 2017-12-23 DIAGNOSIS — G301 Alzheimer's disease with late onset: Secondary | ICD-10-CM | POA: Diagnosis not present

## 2017-12-23 DIAGNOSIS — I4891 Unspecified atrial fibrillation: Secondary | ICD-10-CM | POA: Diagnosis not present

## 2017-12-23 DIAGNOSIS — E785 Hyperlipidemia, unspecified: Secondary | ICD-10-CM | POA: Diagnosis not present

## 2017-12-23 DIAGNOSIS — F419 Anxiety disorder, unspecified: Secondary | ICD-10-CM | POA: Diagnosis not present

## 2017-12-28 DIAGNOSIS — I482 Chronic atrial fibrillation: Secondary | ICD-10-CM | POA: Diagnosis not present

## 2017-12-28 DIAGNOSIS — F0390 Unspecified dementia without behavioral disturbance: Secondary | ICD-10-CM | POA: Diagnosis not present

## 2017-12-28 DIAGNOSIS — R41841 Cognitive communication deficit: Secondary | ICD-10-CM | POA: Diagnosis not present

## 2017-12-28 DIAGNOSIS — Z7901 Long term (current) use of anticoagulants: Secondary | ICD-10-CM | POA: Diagnosis not present

## 2017-12-28 DIAGNOSIS — F419 Anxiety disorder, unspecified: Secondary | ICD-10-CM | POA: Diagnosis not present

## 2017-12-28 DIAGNOSIS — R278 Other lack of coordination: Secondary | ICD-10-CM | POA: Diagnosis not present

## 2017-12-28 DIAGNOSIS — F332 Major depressive disorder, recurrent severe without psychotic features: Secondary | ICD-10-CM | POA: Diagnosis not present

## 2017-12-28 DIAGNOSIS — M6281 Muscle weakness (generalized): Secondary | ICD-10-CM | POA: Diagnosis not present

## 2017-12-28 DIAGNOSIS — R2689 Other abnormalities of gait and mobility: Secondary | ICD-10-CM | POA: Diagnosis not present

## 2017-12-29 DIAGNOSIS — R2689 Other abnormalities of gait and mobility: Secondary | ICD-10-CM | POA: Diagnosis not present

## 2017-12-29 DIAGNOSIS — F332 Major depressive disorder, recurrent severe without psychotic features: Secondary | ICD-10-CM | POA: Diagnosis not present

## 2017-12-29 DIAGNOSIS — R278 Other lack of coordination: Secondary | ICD-10-CM | POA: Diagnosis not present

## 2017-12-29 DIAGNOSIS — F419 Anxiety disorder, unspecified: Secondary | ICD-10-CM | POA: Diagnosis not present

## 2017-12-29 DIAGNOSIS — Z7901 Long term (current) use of anticoagulants: Secondary | ICD-10-CM | POA: Diagnosis not present

## 2017-12-29 DIAGNOSIS — I482 Chronic atrial fibrillation: Secondary | ICD-10-CM | POA: Diagnosis not present

## 2017-12-29 DIAGNOSIS — F0390 Unspecified dementia without behavioral disturbance: Secondary | ICD-10-CM | POA: Diagnosis not present

## 2017-12-29 DIAGNOSIS — M6281 Muscle weakness (generalized): Secondary | ICD-10-CM | POA: Diagnosis not present

## 2017-12-29 DIAGNOSIS — R41841 Cognitive communication deficit: Secondary | ICD-10-CM | POA: Diagnosis not present

## 2017-12-30 DIAGNOSIS — Z7901 Long term (current) use of anticoagulants: Secondary | ICD-10-CM | POA: Diagnosis not present

## 2017-12-30 DIAGNOSIS — I482 Chronic atrial fibrillation: Secondary | ICD-10-CM | POA: Diagnosis not present

## 2017-12-30 DIAGNOSIS — F0390 Unspecified dementia without behavioral disturbance: Secondary | ICD-10-CM | POA: Diagnosis not present

## 2017-12-30 DIAGNOSIS — R2689 Other abnormalities of gait and mobility: Secondary | ICD-10-CM | POA: Diagnosis not present

## 2017-12-30 DIAGNOSIS — F419 Anxiety disorder, unspecified: Secondary | ICD-10-CM | POA: Diagnosis not present

## 2017-12-30 DIAGNOSIS — F332 Major depressive disorder, recurrent severe without psychotic features: Secondary | ICD-10-CM | POA: Diagnosis not present

## 2017-12-30 DIAGNOSIS — M6281 Muscle weakness (generalized): Secondary | ICD-10-CM | POA: Diagnosis not present

## 2017-12-30 DIAGNOSIS — R278 Other lack of coordination: Secondary | ICD-10-CM | POA: Diagnosis not present

## 2017-12-30 DIAGNOSIS — R41841 Cognitive communication deficit: Secondary | ICD-10-CM | POA: Diagnosis not present

## 2018-01-02 DIAGNOSIS — F419 Anxiety disorder, unspecified: Secondary | ICD-10-CM | POA: Diagnosis not present

## 2018-01-02 DIAGNOSIS — M6281 Muscle weakness (generalized): Secondary | ICD-10-CM | POA: Diagnosis not present

## 2018-01-02 DIAGNOSIS — F332 Major depressive disorder, recurrent severe without psychotic features: Secondary | ICD-10-CM | POA: Diagnosis not present

## 2018-01-02 DIAGNOSIS — R2689 Other abnormalities of gait and mobility: Secondary | ICD-10-CM | POA: Diagnosis not present

## 2018-01-02 DIAGNOSIS — F0390 Unspecified dementia without behavioral disturbance: Secondary | ICD-10-CM | POA: Diagnosis not present

## 2018-01-02 DIAGNOSIS — Z7901 Long term (current) use of anticoagulants: Secondary | ICD-10-CM | POA: Diagnosis not present

## 2018-01-02 DIAGNOSIS — R41841 Cognitive communication deficit: Secondary | ICD-10-CM | POA: Diagnosis not present

## 2018-01-02 DIAGNOSIS — R278 Other lack of coordination: Secondary | ICD-10-CM | POA: Diagnosis not present

## 2018-01-02 DIAGNOSIS — I480 Paroxysmal atrial fibrillation: Secondary | ICD-10-CM | POA: Diagnosis not present

## 2018-01-03 DIAGNOSIS — I480 Paroxysmal atrial fibrillation: Secondary | ICD-10-CM | POA: Diagnosis not present

## 2018-01-03 DIAGNOSIS — F332 Major depressive disorder, recurrent severe without psychotic features: Secondary | ICD-10-CM | POA: Diagnosis not present

## 2018-01-03 DIAGNOSIS — F419 Anxiety disorder, unspecified: Secondary | ICD-10-CM | POA: Diagnosis not present

## 2018-01-03 DIAGNOSIS — R2689 Other abnormalities of gait and mobility: Secondary | ICD-10-CM | POA: Diagnosis not present

## 2018-01-03 DIAGNOSIS — Z7901 Long term (current) use of anticoagulants: Secondary | ICD-10-CM | POA: Diagnosis not present

## 2018-01-03 DIAGNOSIS — R41841 Cognitive communication deficit: Secondary | ICD-10-CM | POA: Diagnosis not present

## 2018-01-03 DIAGNOSIS — M6281 Muscle weakness (generalized): Secondary | ICD-10-CM | POA: Diagnosis not present

## 2018-01-03 DIAGNOSIS — F0390 Unspecified dementia without behavioral disturbance: Secondary | ICD-10-CM | POA: Diagnosis not present

## 2018-01-03 DIAGNOSIS — R278 Other lack of coordination: Secondary | ICD-10-CM | POA: Diagnosis not present

## 2018-01-04 DIAGNOSIS — F419 Anxiety disorder, unspecified: Secondary | ICD-10-CM | POA: Diagnosis not present

## 2018-01-04 DIAGNOSIS — R278 Other lack of coordination: Secondary | ICD-10-CM | POA: Diagnosis not present

## 2018-01-04 DIAGNOSIS — F0390 Unspecified dementia without behavioral disturbance: Secondary | ICD-10-CM | POA: Diagnosis not present

## 2018-01-04 DIAGNOSIS — M6281 Muscle weakness (generalized): Secondary | ICD-10-CM | POA: Diagnosis not present

## 2018-01-04 DIAGNOSIS — I480 Paroxysmal atrial fibrillation: Secondary | ICD-10-CM | POA: Diagnosis not present

## 2018-01-04 DIAGNOSIS — F332 Major depressive disorder, recurrent severe without psychotic features: Secondary | ICD-10-CM | POA: Diagnosis not present

## 2018-01-04 DIAGNOSIS — Z7901 Long term (current) use of anticoagulants: Secondary | ICD-10-CM | POA: Diagnosis not present

## 2018-01-04 DIAGNOSIS — R41841 Cognitive communication deficit: Secondary | ICD-10-CM | POA: Diagnosis not present

## 2018-01-04 DIAGNOSIS — R2689 Other abnormalities of gait and mobility: Secondary | ICD-10-CM | POA: Diagnosis not present

## 2018-01-05 DIAGNOSIS — I480 Paroxysmal atrial fibrillation: Secondary | ICD-10-CM | POA: Diagnosis not present

## 2018-01-05 DIAGNOSIS — R41841 Cognitive communication deficit: Secondary | ICD-10-CM | POA: Diagnosis not present

## 2018-01-05 DIAGNOSIS — F332 Major depressive disorder, recurrent severe without psychotic features: Secondary | ICD-10-CM | POA: Diagnosis not present

## 2018-01-05 DIAGNOSIS — Z7901 Long term (current) use of anticoagulants: Secondary | ICD-10-CM | POA: Diagnosis not present

## 2018-01-05 DIAGNOSIS — F0390 Unspecified dementia without behavioral disturbance: Secondary | ICD-10-CM | POA: Diagnosis not present

## 2018-01-05 DIAGNOSIS — F419 Anxiety disorder, unspecified: Secondary | ICD-10-CM | POA: Diagnosis not present

## 2018-01-05 DIAGNOSIS — M6281 Muscle weakness (generalized): Secondary | ICD-10-CM | POA: Diagnosis not present

## 2018-01-05 DIAGNOSIS — R278 Other lack of coordination: Secondary | ICD-10-CM | POA: Diagnosis not present

## 2018-01-05 DIAGNOSIS — R2689 Other abnormalities of gait and mobility: Secondary | ICD-10-CM | POA: Diagnosis not present

## 2018-01-06 DIAGNOSIS — I480 Paroxysmal atrial fibrillation: Secondary | ICD-10-CM | POA: Diagnosis not present

## 2018-01-06 DIAGNOSIS — M6281 Muscle weakness (generalized): Secondary | ICD-10-CM | POA: Diagnosis not present

## 2018-01-06 DIAGNOSIS — R2689 Other abnormalities of gait and mobility: Secondary | ICD-10-CM | POA: Diagnosis not present

## 2018-01-06 DIAGNOSIS — F419 Anxiety disorder, unspecified: Secondary | ICD-10-CM | POA: Diagnosis not present

## 2018-01-06 DIAGNOSIS — R41841 Cognitive communication deficit: Secondary | ICD-10-CM | POA: Diagnosis not present

## 2018-01-06 DIAGNOSIS — Z7901 Long term (current) use of anticoagulants: Secondary | ICD-10-CM | POA: Diagnosis not present

## 2018-01-06 DIAGNOSIS — R278 Other lack of coordination: Secondary | ICD-10-CM | POA: Diagnosis not present

## 2018-01-06 DIAGNOSIS — F332 Major depressive disorder, recurrent severe without psychotic features: Secondary | ICD-10-CM | POA: Diagnosis not present

## 2018-01-06 DIAGNOSIS — F0390 Unspecified dementia without behavioral disturbance: Secondary | ICD-10-CM | POA: Diagnosis not present

## 2018-01-09 DIAGNOSIS — R41841 Cognitive communication deficit: Secondary | ICD-10-CM | POA: Diagnosis not present

## 2018-01-09 DIAGNOSIS — Z7901 Long term (current) use of anticoagulants: Secondary | ICD-10-CM | POA: Diagnosis not present

## 2018-01-09 DIAGNOSIS — M6281 Muscle weakness (generalized): Secondary | ICD-10-CM | POA: Diagnosis not present

## 2018-01-09 DIAGNOSIS — F419 Anxiety disorder, unspecified: Secondary | ICD-10-CM | POA: Diagnosis not present

## 2018-01-09 DIAGNOSIS — R2689 Other abnormalities of gait and mobility: Secondary | ICD-10-CM | POA: Diagnosis not present

## 2018-01-09 DIAGNOSIS — F0390 Unspecified dementia without behavioral disturbance: Secondary | ICD-10-CM | POA: Diagnosis not present

## 2018-01-09 DIAGNOSIS — F332 Major depressive disorder, recurrent severe without psychotic features: Secondary | ICD-10-CM | POA: Diagnosis not present

## 2018-01-09 DIAGNOSIS — I480 Paroxysmal atrial fibrillation: Secondary | ICD-10-CM | POA: Diagnosis not present

## 2018-01-09 DIAGNOSIS — R278 Other lack of coordination: Secondary | ICD-10-CM | POA: Diagnosis not present

## 2018-01-10 DIAGNOSIS — R41841 Cognitive communication deficit: Secondary | ICD-10-CM | POA: Diagnosis not present

## 2018-01-10 DIAGNOSIS — Z7901 Long term (current) use of anticoagulants: Secondary | ICD-10-CM | POA: Diagnosis not present

## 2018-01-10 DIAGNOSIS — M6281 Muscle weakness (generalized): Secondary | ICD-10-CM | POA: Diagnosis not present

## 2018-01-10 DIAGNOSIS — F0390 Unspecified dementia without behavioral disturbance: Secondary | ICD-10-CM | POA: Diagnosis not present

## 2018-01-10 DIAGNOSIS — R2689 Other abnormalities of gait and mobility: Secondary | ICD-10-CM | POA: Diagnosis not present

## 2018-01-10 DIAGNOSIS — F332 Major depressive disorder, recurrent severe without psychotic features: Secondary | ICD-10-CM | POA: Diagnosis not present

## 2018-01-10 DIAGNOSIS — I480 Paroxysmal atrial fibrillation: Secondary | ICD-10-CM | POA: Diagnosis not present

## 2018-01-10 DIAGNOSIS — R278 Other lack of coordination: Secondary | ICD-10-CM | POA: Diagnosis not present

## 2018-01-10 DIAGNOSIS — F419 Anxiety disorder, unspecified: Secondary | ICD-10-CM | POA: Diagnosis not present

## 2018-01-11 DIAGNOSIS — Z7901 Long term (current) use of anticoagulants: Secondary | ICD-10-CM | POA: Diagnosis not present

## 2018-01-11 DIAGNOSIS — M6281 Muscle weakness (generalized): Secondary | ICD-10-CM | POA: Diagnosis not present

## 2018-01-11 DIAGNOSIS — F419 Anxiety disorder, unspecified: Secondary | ICD-10-CM | POA: Diagnosis not present

## 2018-01-11 DIAGNOSIS — F0390 Unspecified dementia without behavioral disturbance: Secondary | ICD-10-CM | POA: Diagnosis not present

## 2018-01-11 DIAGNOSIS — R278 Other lack of coordination: Secondary | ICD-10-CM | POA: Diagnosis not present

## 2018-01-11 DIAGNOSIS — R41841 Cognitive communication deficit: Secondary | ICD-10-CM | POA: Diagnosis not present

## 2018-01-11 DIAGNOSIS — F332 Major depressive disorder, recurrent severe without psychotic features: Secondary | ICD-10-CM | POA: Diagnosis not present

## 2018-01-11 DIAGNOSIS — I480 Paroxysmal atrial fibrillation: Secondary | ICD-10-CM | POA: Diagnosis not present

## 2018-01-11 DIAGNOSIS — R2689 Other abnormalities of gait and mobility: Secondary | ICD-10-CM | POA: Diagnosis not present

## 2018-01-13 DIAGNOSIS — R278 Other lack of coordination: Secondary | ICD-10-CM | POA: Diagnosis not present

## 2018-01-13 DIAGNOSIS — M6281 Muscle weakness (generalized): Secondary | ICD-10-CM | POA: Diagnosis not present

## 2018-01-13 DIAGNOSIS — F0390 Unspecified dementia without behavioral disturbance: Secondary | ICD-10-CM | POA: Diagnosis not present

## 2018-01-13 DIAGNOSIS — Z7901 Long term (current) use of anticoagulants: Secondary | ICD-10-CM | POA: Diagnosis not present

## 2018-01-13 DIAGNOSIS — R2689 Other abnormalities of gait and mobility: Secondary | ICD-10-CM | POA: Diagnosis not present

## 2018-01-13 DIAGNOSIS — F419 Anxiety disorder, unspecified: Secondary | ICD-10-CM | POA: Diagnosis not present

## 2018-01-13 DIAGNOSIS — I480 Paroxysmal atrial fibrillation: Secondary | ICD-10-CM | POA: Diagnosis not present

## 2018-01-13 DIAGNOSIS — R41841 Cognitive communication deficit: Secondary | ICD-10-CM | POA: Diagnosis not present

## 2018-01-13 DIAGNOSIS — F332 Major depressive disorder, recurrent severe without psychotic features: Secondary | ICD-10-CM | POA: Diagnosis not present

## 2018-01-14 DIAGNOSIS — R278 Other lack of coordination: Secondary | ICD-10-CM | POA: Diagnosis not present

## 2018-01-14 DIAGNOSIS — I480 Paroxysmal atrial fibrillation: Secondary | ICD-10-CM | POA: Diagnosis not present

## 2018-01-14 DIAGNOSIS — F0390 Unspecified dementia without behavioral disturbance: Secondary | ICD-10-CM | POA: Diagnosis not present

## 2018-01-14 DIAGNOSIS — F419 Anxiety disorder, unspecified: Secondary | ICD-10-CM | POA: Diagnosis not present

## 2018-01-14 DIAGNOSIS — R41841 Cognitive communication deficit: Secondary | ICD-10-CM | POA: Diagnosis not present

## 2018-01-14 DIAGNOSIS — R2689 Other abnormalities of gait and mobility: Secondary | ICD-10-CM | POA: Diagnosis not present

## 2018-01-14 DIAGNOSIS — M6281 Muscle weakness (generalized): Secondary | ICD-10-CM | POA: Diagnosis not present

## 2018-01-14 DIAGNOSIS — F332 Major depressive disorder, recurrent severe without psychotic features: Secondary | ICD-10-CM | POA: Diagnosis not present

## 2018-01-14 DIAGNOSIS — Z7901 Long term (current) use of anticoagulants: Secondary | ICD-10-CM | POA: Diagnosis not present

## 2018-01-16 DIAGNOSIS — R278 Other lack of coordination: Secondary | ICD-10-CM | POA: Diagnosis not present

## 2018-01-16 DIAGNOSIS — Z7901 Long term (current) use of anticoagulants: Secondary | ICD-10-CM | POA: Diagnosis not present

## 2018-01-16 DIAGNOSIS — R2689 Other abnormalities of gait and mobility: Secondary | ICD-10-CM | POA: Diagnosis not present

## 2018-01-16 DIAGNOSIS — F0390 Unspecified dementia without behavioral disturbance: Secondary | ICD-10-CM | POA: Diagnosis not present

## 2018-01-16 DIAGNOSIS — F419 Anxiety disorder, unspecified: Secondary | ICD-10-CM | POA: Diagnosis not present

## 2018-01-16 DIAGNOSIS — I480 Paroxysmal atrial fibrillation: Secondary | ICD-10-CM | POA: Diagnosis not present

## 2018-01-16 DIAGNOSIS — F332 Major depressive disorder, recurrent severe without psychotic features: Secondary | ICD-10-CM | POA: Diagnosis not present

## 2018-01-16 DIAGNOSIS — R41841 Cognitive communication deficit: Secondary | ICD-10-CM | POA: Diagnosis not present

## 2018-01-16 DIAGNOSIS — M6281 Muscle weakness (generalized): Secondary | ICD-10-CM | POA: Diagnosis not present

## 2018-01-17 DIAGNOSIS — I480 Paroxysmal atrial fibrillation: Secondary | ICD-10-CM | POA: Diagnosis not present

## 2018-01-17 DIAGNOSIS — Z7901 Long term (current) use of anticoagulants: Secondary | ICD-10-CM | POA: Diagnosis not present

## 2018-01-17 DIAGNOSIS — F332 Major depressive disorder, recurrent severe without psychotic features: Secondary | ICD-10-CM | POA: Diagnosis not present

## 2018-01-17 DIAGNOSIS — R2689 Other abnormalities of gait and mobility: Secondary | ICD-10-CM | POA: Diagnosis not present

## 2018-01-17 DIAGNOSIS — R278 Other lack of coordination: Secondary | ICD-10-CM | POA: Diagnosis not present

## 2018-01-17 DIAGNOSIS — M6281 Muscle weakness (generalized): Secondary | ICD-10-CM | POA: Diagnosis not present

## 2018-01-17 DIAGNOSIS — F419 Anxiety disorder, unspecified: Secondary | ICD-10-CM | POA: Diagnosis not present

## 2018-01-17 DIAGNOSIS — F0390 Unspecified dementia without behavioral disturbance: Secondary | ICD-10-CM | POA: Diagnosis not present

## 2018-01-17 DIAGNOSIS — R41841 Cognitive communication deficit: Secondary | ICD-10-CM | POA: Diagnosis not present

## 2018-01-18 DIAGNOSIS — F0390 Unspecified dementia without behavioral disturbance: Secondary | ICD-10-CM | POA: Diagnosis not present

## 2018-01-18 DIAGNOSIS — M6281 Muscle weakness (generalized): Secondary | ICD-10-CM | POA: Diagnosis not present

## 2018-01-18 DIAGNOSIS — F332 Major depressive disorder, recurrent severe without psychotic features: Secondary | ICD-10-CM | POA: Diagnosis not present

## 2018-01-18 DIAGNOSIS — Z7901 Long term (current) use of anticoagulants: Secondary | ICD-10-CM | POA: Diagnosis not present

## 2018-01-18 DIAGNOSIS — I480 Paroxysmal atrial fibrillation: Secondary | ICD-10-CM | POA: Diagnosis not present

## 2018-01-18 DIAGNOSIS — F419 Anxiety disorder, unspecified: Secondary | ICD-10-CM | POA: Diagnosis not present

## 2018-01-18 DIAGNOSIS — R41841 Cognitive communication deficit: Secondary | ICD-10-CM | POA: Diagnosis not present

## 2018-01-18 DIAGNOSIS — R2689 Other abnormalities of gait and mobility: Secondary | ICD-10-CM | POA: Diagnosis not present

## 2018-01-18 DIAGNOSIS — R278 Other lack of coordination: Secondary | ICD-10-CM | POA: Diagnosis not present

## 2018-01-20 DIAGNOSIS — M6281 Muscle weakness (generalized): Secondary | ICD-10-CM | POA: Diagnosis not present

## 2018-01-20 DIAGNOSIS — F0390 Unspecified dementia without behavioral disturbance: Secondary | ICD-10-CM | POA: Diagnosis not present

## 2018-01-20 DIAGNOSIS — R2689 Other abnormalities of gait and mobility: Secondary | ICD-10-CM | POA: Diagnosis not present

## 2018-01-20 DIAGNOSIS — Z7901 Long term (current) use of anticoagulants: Secondary | ICD-10-CM | POA: Diagnosis not present

## 2018-01-20 DIAGNOSIS — F332 Major depressive disorder, recurrent severe without psychotic features: Secondary | ICD-10-CM | POA: Diagnosis not present

## 2018-01-20 DIAGNOSIS — F419 Anxiety disorder, unspecified: Secondary | ICD-10-CM | POA: Diagnosis not present

## 2018-01-20 DIAGNOSIS — R278 Other lack of coordination: Secondary | ICD-10-CM | POA: Diagnosis not present

## 2018-01-20 DIAGNOSIS — I480 Paroxysmal atrial fibrillation: Secondary | ICD-10-CM | POA: Diagnosis not present

## 2018-01-20 DIAGNOSIS — R41841 Cognitive communication deficit: Secondary | ICD-10-CM | POA: Diagnosis not present

## 2018-01-21 DIAGNOSIS — F332 Major depressive disorder, recurrent severe without psychotic features: Secondary | ICD-10-CM | POA: Diagnosis not present

## 2018-01-21 DIAGNOSIS — F419 Anxiety disorder, unspecified: Secondary | ICD-10-CM | POA: Diagnosis not present

## 2018-01-21 DIAGNOSIS — R278 Other lack of coordination: Secondary | ICD-10-CM | POA: Diagnosis not present

## 2018-01-21 DIAGNOSIS — I480 Paroxysmal atrial fibrillation: Secondary | ICD-10-CM | POA: Diagnosis not present

## 2018-01-21 DIAGNOSIS — M6281 Muscle weakness (generalized): Secondary | ICD-10-CM | POA: Diagnosis not present

## 2018-01-21 DIAGNOSIS — F0390 Unspecified dementia without behavioral disturbance: Secondary | ICD-10-CM | POA: Diagnosis not present

## 2018-01-21 DIAGNOSIS — R2689 Other abnormalities of gait and mobility: Secondary | ICD-10-CM | POA: Diagnosis not present

## 2018-01-21 DIAGNOSIS — Z7901 Long term (current) use of anticoagulants: Secondary | ICD-10-CM | POA: Diagnosis not present

## 2018-01-21 DIAGNOSIS — R41841 Cognitive communication deficit: Secondary | ICD-10-CM | POA: Diagnosis not present

## 2018-01-23 DIAGNOSIS — M6281 Muscle weakness (generalized): Secondary | ICD-10-CM | POA: Diagnosis not present

## 2018-01-23 DIAGNOSIS — F419 Anxiety disorder, unspecified: Secondary | ICD-10-CM | POA: Diagnosis not present

## 2018-01-23 DIAGNOSIS — Z7901 Long term (current) use of anticoagulants: Secondary | ICD-10-CM | POA: Diagnosis not present

## 2018-01-23 DIAGNOSIS — F0390 Unspecified dementia without behavioral disturbance: Secondary | ICD-10-CM | POA: Diagnosis not present

## 2018-01-23 DIAGNOSIS — F332 Major depressive disorder, recurrent severe without psychotic features: Secondary | ICD-10-CM | POA: Diagnosis not present

## 2018-01-23 DIAGNOSIS — R41841 Cognitive communication deficit: Secondary | ICD-10-CM | POA: Diagnosis not present

## 2018-01-23 DIAGNOSIS — I480 Paroxysmal atrial fibrillation: Secondary | ICD-10-CM | POA: Diagnosis not present

## 2018-01-23 DIAGNOSIS — R2689 Other abnormalities of gait and mobility: Secondary | ICD-10-CM | POA: Diagnosis not present

## 2018-01-23 DIAGNOSIS — R278 Other lack of coordination: Secondary | ICD-10-CM | POA: Diagnosis not present

## 2018-02-01 DIAGNOSIS — I10 Essential (primary) hypertension: Secondary | ICD-10-CM | POA: Diagnosis not present

## 2018-02-01 DIAGNOSIS — F0390 Unspecified dementia without behavioral disturbance: Secondary | ICD-10-CM | POA: Diagnosis not present

## 2018-02-01 DIAGNOSIS — I4891 Unspecified atrial fibrillation: Secondary | ICD-10-CM | POA: Diagnosis not present

## 2018-02-01 DIAGNOSIS — E785 Hyperlipidemia, unspecified: Secondary | ICD-10-CM | POA: Diagnosis not present

## 2018-02-03 DIAGNOSIS — R4701 Aphasia: Secondary | ICD-10-CM | POA: Diagnosis not present

## 2018-02-03 DIAGNOSIS — R278 Other lack of coordination: Secondary | ICD-10-CM | POA: Diagnosis not present

## 2018-02-03 DIAGNOSIS — F332 Major depressive disorder, recurrent severe without psychotic features: Secondary | ICD-10-CM | POA: Diagnosis not present

## 2018-02-03 DIAGNOSIS — R41841 Cognitive communication deficit: Secondary | ICD-10-CM | POA: Diagnosis not present

## 2018-02-03 DIAGNOSIS — Z7901 Long term (current) use of anticoagulants: Secondary | ICD-10-CM | POA: Diagnosis not present

## 2018-02-03 DIAGNOSIS — F0390 Unspecified dementia without behavioral disturbance: Secondary | ICD-10-CM | POA: Diagnosis not present

## 2018-02-03 DIAGNOSIS — M6281 Muscle weakness (generalized): Secondary | ICD-10-CM | POA: Diagnosis not present

## 2018-02-03 DIAGNOSIS — I480 Paroxysmal atrial fibrillation: Secondary | ICD-10-CM | POA: Diagnosis not present

## 2018-02-03 DIAGNOSIS — F419 Anxiety disorder, unspecified: Secondary | ICD-10-CM | POA: Diagnosis not present

## 2018-02-06 DIAGNOSIS — F0390 Unspecified dementia without behavioral disturbance: Secondary | ICD-10-CM | POA: Diagnosis not present

## 2018-02-06 DIAGNOSIS — F332 Major depressive disorder, recurrent severe without psychotic features: Secondary | ICD-10-CM | POA: Diagnosis not present

## 2018-02-06 DIAGNOSIS — I480 Paroxysmal atrial fibrillation: Secondary | ICD-10-CM | POA: Diagnosis not present

## 2018-02-06 DIAGNOSIS — M6281 Muscle weakness (generalized): Secondary | ICD-10-CM | POA: Diagnosis not present

## 2018-02-06 DIAGNOSIS — R4701 Aphasia: Secondary | ICD-10-CM | POA: Diagnosis not present

## 2018-02-06 DIAGNOSIS — Z7901 Long term (current) use of anticoagulants: Secondary | ICD-10-CM | POA: Diagnosis not present

## 2018-02-06 DIAGNOSIS — F419 Anxiety disorder, unspecified: Secondary | ICD-10-CM | POA: Diagnosis not present

## 2018-02-06 DIAGNOSIS — R278 Other lack of coordination: Secondary | ICD-10-CM | POA: Diagnosis not present

## 2018-02-06 DIAGNOSIS — R41841 Cognitive communication deficit: Secondary | ICD-10-CM | POA: Diagnosis not present

## 2018-02-07 DIAGNOSIS — R278 Other lack of coordination: Secondary | ICD-10-CM | POA: Diagnosis not present

## 2018-02-07 DIAGNOSIS — M6281 Muscle weakness (generalized): Secondary | ICD-10-CM | POA: Diagnosis not present

## 2018-02-07 DIAGNOSIS — I480 Paroxysmal atrial fibrillation: Secondary | ICD-10-CM | POA: Diagnosis not present

## 2018-02-07 DIAGNOSIS — F0390 Unspecified dementia without behavioral disturbance: Secondary | ICD-10-CM | POA: Diagnosis not present

## 2018-02-07 DIAGNOSIS — R41841 Cognitive communication deficit: Secondary | ICD-10-CM | POA: Diagnosis not present

## 2018-02-07 DIAGNOSIS — R4701 Aphasia: Secondary | ICD-10-CM | POA: Diagnosis not present

## 2018-02-07 DIAGNOSIS — Z7901 Long term (current) use of anticoagulants: Secondary | ICD-10-CM | POA: Diagnosis not present

## 2018-02-07 DIAGNOSIS — F332 Major depressive disorder, recurrent severe without psychotic features: Secondary | ICD-10-CM | POA: Diagnosis not present

## 2018-02-07 DIAGNOSIS — F419 Anxiety disorder, unspecified: Secondary | ICD-10-CM | POA: Diagnosis not present

## 2018-02-08 DIAGNOSIS — I480 Paroxysmal atrial fibrillation: Secondary | ICD-10-CM | POA: Diagnosis not present

## 2018-02-08 DIAGNOSIS — F332 Major depressive disorder, recurrent severe without psychotic features: Secondary | ICD-10-CM | POA: Diagnosis not present

## 2018-02-08 DIAGNOSIS — R4701 Aphasia: Secondary | ICD-10-CM | POA: Diagnosis not present

## 2018-02-08 DIAGNOSIS — F419 Anxiety disorder, unspecified: Secondary | ICD-10-CM | POA: Diagnosis not present

## 2018-02-08 DIAGNOSIS — R41841 Cognitive communication deficit: Secondary | ICD-10-CM | POA: Diagnosis not present

## 2018-02-08 DIAGNOSIS — Z7901 Long term (current) use of anticoagulants: Secondary | ICD-10-CM | POA: Diagnosis not present

## 2018-02-08 DIAGNOSIS — R278 Other lack of coordination: Secondary | ICD-10-CM | POA: Diagnosis not present

## 2018-02-08 DIAGNOSIS — F0390 Unspecified dementia without behavioral disturbance: Secondary | ICD-10-CM | POA: Diagnosis not present

## 2018-02-08 DIAGNOSIS — M6281 Muscle weakness (generalized): Secondary | ICD-10-CM | POA: Diagnosis not present

## 2018-02-09 DIAGNOSIS — R41841 Cognitive communication deficit: Secondary | ICD-10-CM | POA: Diagnosis not present

## 2018-02-09 DIAGNOSIS — F332 Major depressive disorder, recurrent severe without psychotic features: Secondary | ICD-10-CM | POA: Diagnosis not present

## 2018-02-09 DIAGNOSIS — M6281 Muscle weakness (generalized): Secondary | ICD-10-CM | POA: Diagnosis not present

## 2018-02-09 DIAGNOSIS — Z7901 Long term (current) use of anticoagulants: Secondary | ICD-10-CM | POA: Diagnosis not present

## 2018-02-09 DIAGNOSIS — I480 Paroxysmal atrial fibrillation: Secondary | ICD-10-CM | POA: Diagnosis not present

## 2018-02-09 DIAGNOSIS — R278 Other lack of coordination: Secondary | ICD-10-CM | POA: Diagnosis not present

## 2018-02-09 DIAGNOSIS — R4701 Aphasia: Secondary | ICD-10-CM | POA: Diagnosis not present

## 2018-02-09 DIAGNOSIS — F0390 Unspecified dementia without behavioral disturbance: Secondary | ICD-10-CM | POA: Diagnosis not present

## 2018-02-09 DIAGNOSIS — F419 Anxiety disorder, unspecified: Secondary | ICD-10-CM | POA: Diagnosis not present

## 2018-02-10 DIAGNOSIS — R41841 Cognitive communication deficit: Secondary | ICD-10-CM | POA: Diagnosis not present

## 2018-02-10 DIAGNOSIS — F419 Anxiety disorder, unspecified: Secondary | ICD-10-CM | POA: Diagnosis not present

## 2018-02-10 DIAGNOSIS — R278 Other lack of coordination: Secondary | ICD-10-CM | POA: Diagnosis not present

## 2018-02-10 DIAGNOSIS — F0390 Unspecified dementia without behavioral disturbance: Secondary | ICD-10-CM | POA: Diagnosis not present

## 2018-02-10 DIAGNOSIS — R4701 Aphasia: Secondary | ICD-10-CM | POA: Diagnosis not present

## 2018-02-10 DIAGNOSIS — M6281 Muscle weakness (generalized): Secondary | ICD-10-CM | POA: Diagnosis not present

## 2018-02-10 DIAGNOSIS — F332 Major depressive disorder, recurrent severe without psychotic features: Secondary | ICD-10-CM | POA: Diagnosis not present

## 2018-02-10 DIAGNOSIS — Z7901 Long term (current) use of anticoagulants: Secondary | ICD-10-CM | POA: Diagnosis not present

## 2018-02-10 DIAGNOSIS — I480 Paroxysmal atrial fibrillation: Secondary | ICD-10-CM | POA: Diagnosis not present

## 2018-02-13 DIAGNOSIS — Z7901 Long term (current) use of anticoagulants: Secondary | ICD-10-CM | POA: Diagnosis not present

## 2018-02-13 DIAGNOSIS — F419 Anxiety disorder, unspecified: Secondary | ICD-10-CM | POA: Diagnosis not present

## 2018-02-13 DIAGNOSIS — R4701 Aphasia: Secondary | ICD-10-CM | POA: Diagnosis not present

## 2018-02-13 DIAGNOSIS — F0390 Unspecified dementia without behavioral disturbance: Secondary | ICD-10-CM | POA: Diagnosis not present

## 2018-02-13 DIAGNOSIS — R41841 Cognitive communication deficit: Secondary | ICD-10-CM | POA: Diagnosis not present

## 2018-02-13 DIAGNOSIS — F332 Major depressive disorder, recurrent severe without psychotic features: Secondary | ICD-10-CM | POA: Diagnosis not present

## 2018-02-13 DIAGNOSIS — I480 Paroxysmal atrial fibrillation: Secondary | ICD-10-CM | POA: Diagnosis not present

## 2018-02-13 DIAGNOSIS — M6281 Muscle weakness (generalized): Secondary | ICD-10-CM | POA: Diagnosis not present

## 2018-02-13 DIAGNOSIS — R278 Other lack of coordination: Secondary | ICD-10-CM | POA: Diagnosis not present

## 2018-02-14 DIAGNOSIS — Z7901 Long term (current) use of anticoagulants: Secondary | ICD-10-CM | POA: Diagnosis not present

## 2018-02-14 DIAGNOSIS — M6281 Muscle weakness (generalized): Secondary | ICD-10-CM | POA: Diagnosis not present

## 2018-02-14 DIAGNOSIS — R278 Other lack of coordination: Secondary | ICD-10-CM | POA: Diagnosis not present

## 2018-02-14 DIAGNOSIS — F0390 Unspecified dementia without behavioral disturbance: Secondary | ICD-10-CM | POA: Diagnosis not present

## 2018-02-14 DIAGNOSIS — R41841 Cognitive communication deficit: Secondary | ICD-10-CM | POA: Diagnosis not present

## 2018-02-14 DIAGNOSIS — R4701 Aphasia: Secondary | ICD-10-CM | POA: Diagnosis not present

## 2018-02-14 DIAGNOSIS — F332 Major depressive disorder, recurrent severe without psychotic features: Secondary | ICD-10-CM | POA: Diagnosis not present

## 2018-02-14 DIAGNOSIS — F419 Anxiety disorder, unspecified: Secondary | ICD-10-CM | POA: Diagnosis not present

## 2018-02-14 DIAGNOSIS — I480 Paroxysmal atrial fibrillation: Secondary | ICD-10-CM | POA: Diagnosis not present

## 2018-02-15 DIAGNOSIS — Z7901 Long term (current) use of anticoagulants: Secondary | ICD-10-CM | POA: Diagnosis not present

## 2018-02-15 DIAGNOSIS — R278 Other lack of coordination: Secondary | ICD-10-CM | POA: Diagnosis not present

## 2018-02-15 DIAGNOSIS — F332 Major depressive disorder, recurrent severe without psychotic features: Secondary | ICD-10-CM | POA: Diagnosis not present

## 2018-02-15 DIAGNOSIS — F0390 Unspecified dementia without behavioral disturbance: Secondary | ICD-10-CM | POA: Diagnosis not present

## 2018-02-15 DIAGNOSIS — R4701 Aphasia: Secondary | ICD-10-CM | POA: Diagnosis not present

## 2018-02-15 DIAGNOSIS — F419 Anxiety disorder, unspecified: Secondary | ICD-10-CM | POA: Diagnosis not present

## 2018-02-15 DIAGNOSIS — M6281 Muscle weakness (generalized): Secondary | ICD-10-CM | POA: Diagnosis not present

## 2018-02-15 DIAGNOSIS — I480 Paroxysmal atrial fibrillation: Secondary | ICD-10-CM | POA: Diagnosis not present

## 2018-02-15 DIAGNOSIS — R41841 Cognitive communication deficit: Secondary | ICD-10-CM | POA: Diagnosis not present

## 2018-02-16 DIAGNOSIS — M6281 Muscle weakness (generalized): Secondary | ICD-10-CM | POA: Diagnosis not present

## 2018-02-16 DIAGNOSIS — R278 Other lack of coordination: Secondary | ICD-10-CM | POA: Diagnosis not present

## 2018-02-16 DIAGNOSIS — F332 Major depressive disorder, recurrent severe without psychotic features: Secondary | ICD-10-CM | POA: Diagnosis not present

## 2018-02-16 DIAGNOSIS — F419 Anxiety disorder, unspecified: Secondary | ICD-10-CM | POA: Diagnosis not present

## 2018-02-16 DIAGNOSIS — I480 Paroxysmal atrial fibrillation: Secondary | ICD-10-CM | POA: Diagnosis not present

## 2018-02-16 DIAGNOSIS — F0390 Unspecified dementia without behavioral disturbance: Secondary | ICD-10-CM | POA: Diagnosis not present

## 2018-02-16 DIAGNOSIS — R41841 Cognitive communication deficit: Secondary | ICD-10-CM | POA: Diagnosis not present

## 2018-02-16 DIAGNOSIS — Z7901 Long term (current) use of anticoagulants: Secondary | ICD-10-CM | POA: Diagnosis not present

## 2018-02-16 DIAGNOSIS — R4701 Aphasia: Secondary | ICD-10-CM | POA: Diagnosis not present

## 2018-02-17 ENCOUNTER — Ambulatory Visit (HOSPITAL_COMMUNITY): Payer: Medicare HMO | Admitting: Psychiatry

## 2018-02-17 DIAGNOSIS — I480 Paroxysmal atrial fibrillation: Secondary | ICD-10-CM | POA: Diagnosis not present

## 2018-02-17 DIAGNOSIS — F332 Major depressive disorder, recurrent severe without psychotic features: Secondary | ICD-10-CM | POA: Diagnosis not present

## 2018-02-17 DIAGNOSIS — R4701 Aphasia: Secondary | ICD-10-CM | POA: Diagnosis not present

## 2018-02-17 DIAGNOSIS — R278 Other lack of coordination: Secondary | ICD-10-CM | POA: Diagnosis not present

## 2018-02-17 DIAGNOSIS — M6281 Muscle weakness (generalized): Secondary | ICD-10-CM | POA: Diagnosis not present

## 2018-02-17 DIAGNOSIS — Z7901 Long term (current) use of anticoagulants: Secondary | ICD-10-CM | POA: Diagnosis not present

## 2018-02-17 DIAGNOSIS — F419 Anxiety disorder, unspecified: Secondary | ICD-10-CM | POA: Diagnosis not present

## 2018-02-17 DIAGNOSIS — R41841 Cognitive communication deficit: Secondary | ICD-10-CM | POA: Diagnosis not present

## 2018-02-17 DIAGNOSIS — F0390 Unspecified dementia without behavioral disturbance: Secondary | ICD-10-CM | POA: Diagnosis not present

## 2018-02-20 DIAGNOSIS — F0390 Unspecified dementia without behavioral disturbance: Secondary | ICD-10-CM | POA: Diagnosis not present

## 2018-02-20 DIAGNOSIS — I480 Paroxysmal atrial fibrillation: Secondary | ICD-10-CM | POA: Diagnosis not present

## 2018-02-20 DIAGNOSIS — Z7901 Long term (current) use of anticoagulants: Secondary | ICD-10-CM | POA: Diagnosis not present

## 2018-02-20 DIAGNOSIS — M6281 Muscle weakness (generalized): Secondary | ICD-10-CM | POA: Diagnosis not present

## 2018-02-20 DIAGNOSIS — R278 Other lack of coordination: Secondary | ICD-10-CM | POA: Diagnosis not present

## 2018-02-20 DIAGNOSIS — R41841 Cognitive communication deficit: Secondary | ICD-10-CM | POA: Diagnosis not present

## 2018-02-20 DIAGNOSIS — R4701 Aphasia: Secondary | ICD-10-CM | POA: Diagnosis not present

## 2018-02-20 DIAGNOSIS — F332 Major depressive disorder, recurrent severe without psychotic features: Secondary | ICD-10-CM | POA: Diagnosis not present

## 2018-02-20 DIAGNOSIS — F419 Anxiety disorder, unspecified: Secondary | ICD-10-CM | POA: Diagnosis not present

## 2018-02-21 DIAGNOSIS — R278 Other lack of coordination: Secondary | ICD-10-CM | POA: Diagnosis not present

## 2018-02-21 DIAGNOSIS — R41841 Cognitive communication deficit: Secondary | ICD-10-CM | POA: Diagnosis not present

## 2018-02-21 DIAGNOSIS — F0390 Unspecified dementia without behavioral disturbance: Secondary | ICD-10-CM | POA: Diagnosis not present

## 2018-02-21 DIAGNOSIS — Z7901 Long term (current) use of anticoagulants: Secondary | ICD-10-CM | POA: Diagnosis not present

## 2018-02-21 DIAGNOSIS — F332 Major depressive disorder, recurrent severe without psychotic features: Secondary | ICD-10-CM | POA: Diagnosis not present

## 2018-02-21 DIAGNOSIS — R4701 Aphasia: Secondary | ICD-10-CM | POA: Diagnosis not present

## 2018-02-21 DIAGNOSIS — F419 Anxiety disorder, unspecified: Secondary | ICD-10-CM | POA: Diagnosis not present

## 2018-02-21 DIAGNOSIS — I480 Paroxysmal atrial fibrillation: Secondary | ICD-10-CM | POA: Diagnosis not present

## 2018-02-21 DIAGNOSIS — M6281 Muscle weakness (generalized): Secondary | ICD-10-CM | POA: Diagnosis not present

## 2018-02-22 DIAGNOSIS — R278 Other lack of coordination: Secondary | ICD-10-CM | POA: Diagnosis not present

## 2018-02-22 DIAGNOSIS — R41841 Cognitive communication deficit: Secondary | ICD-10-CM | POA: Diagnosis not present

## 2018-02-22 DIAGNOSIS — F419 Anxiety disorder, unspecified: Secondary | ICD-10-CM | POA: Diagnosis not present

## 2018-02-22 DIAGNOSIS — F332 Major depressive disorder, recurrent severe without psychotic features: Secondary | ICD-10-CM | POA: Diagnosis not present

## 2018-02-22 DIAGNOSIS — R4701 Aphasia: Secondary | ICD-10-CM | POA: Diagnosis not present

## 2018-02-22 DIAGNOSIS — M6281 Muscle weakness (generalized): Secondary | ICD-10-CM | POA: Diagnosis not present

## 2018-02-22 DIAGNOSIS — Z7901 Long term (current) use of anticoagulants: Secondary | ICD-10-CM | POA: Diagnosis not present

## 2018-02-22 DIAGNOSIS — F0390 Unspecified dementia without behavioral disturbance: Secondary | ICD-10-CM | POA: Diagnosis not present

## 2018-02-22 DIAGNOSIS — I480 Paroxysmal atrial fibrillation: Secondary | ICD-10-CM | POA: Diagnosis not present

## 2018-02-22 NOTE — Addendum Note (Signed)
Addended by: Dorie Rank E on: 02/22/2018 05:30 PM   Modules accepted: Orders

## 2018-02-23 DIAGNOSIS — Z7901 Long term (current) use of anticoagulants: Secondary | ICD-10-CM | POA: Diagnosis not present

## 2018-02-23 DIAGNOSIS — F0390 Unspecified dementia without behavioral disturbance: Secondary | ICD-10-CM | POA: Diagnosis not present

## 2018-02-23 DIAGNOSIS — I480 Paroxysmal atrial fibrillation: Secondary | ICD-10-CM | POA: Diagnosis not present

## 2018-02-23 DIAGNOSIS — F332 Major depressive disorder, recurrent severe without psychotic features: Secondary | ICD-10-CM | POA: Diagnosis not present

## 2018-02-23 DIAGNOSIS — R278 Other lack of coordination: Secondary | ICD-10-CM | POA: Diagnosis not present

## 2018-02-23 DIAGNOSIS — F419 Anxiety disorder, unspecified: Secondary | ICD-10-CM | POA: Diagnosis not present

## 2018-02-23 DIAGNOSIS — R4701 Aphasia: Secondary | ICD-10-CM | POA: Diagnosis not present

## 2018-02-23 DIAGNOSIS — R41841 Cognitive communication deficit: Secondary | ICD-10-CM | POA: Diagnosis not present

## 2018-02-23 DIAGNOSIS — M6281 Muscle weakness (generalized): Secondary | ICD-10-CM | POA: Diagnosis not present

## 2018-02-24 DIAGNOSIS — Z7901 Long term (current) use of anticoagulants: Secondary | ICD-10-CM | POA: Diagnosis not present

## 2018-02-24 DIAGNOSIS — F0390 Unspecified dementia without behavioral disturbance: Secondary | ICD-10-CM | POA: Diagnosis not present

## 2018-02-24 DIAGNOSIS — M6281 Muscle weakness (generalized): Secondary | ICD-10-CM | POA: Diagnosis not present

## 2018-02-24 DIAGNOSIS — R278 Other lack of coordination: Secondary | ICD-10-CM | POA: Diagnosis not present

## 2018-02-24 DIAGNOSIS — F332 Major depressive disorder, recurrent severe without psychotic features: Secondary | ICD-10-CM | POA: Diagnosis not present

## 2018-02-24 DIAGNOSIS — R41841 Cognitive communication deficit: Secondary | ICD-10-CM | POA: Diagnosis not present

## 2018-02-24 DIAGNOSIS — I480 Paroxysmal atrial fibrillation: Secondary | ICD-10-CM | POA: Diagnosis not present

## 2018-02-24 DIAGNOSIS — F419 Anxiety disorder, unspecified: Secondary | ICD-10-CM | POA: Diagnosis not present

## 2018-02-24 DIAGNOSIS — R4701 Aphasia: Secondary | ICD-10-CM | POA: Diagnosis not present

## 2018-02-27 DIAGNOSIS — F0390 Unspecified dementia without behavioral disturbance: Secondary | ICD-10-CM | POA: Diagnosis not present

## 2018-02-27 DIAGNOSIS — I480 Paroxysmal atrial fibrillation: Secondary | ICD-10-CM | POA: Diagnosis not present

## 2018-02-27 DIAGNOSIS — F332 Major depressive disorder, recurrent severe without psychotic features: Secondary | ICD-10-CM | POA: Diagnosis not present

## 2018-02-27 DIAGNOSIS — Z7901 Long term (current) use of anticoagulants: Secondary | ICD-10-CM | POA: Diagnosis not present

## 2018-02-27 DIAGNOSIS — M6281 Muscle weakness (generalized): Secondary | ICD-10-CM | POA: Diagnosis not present

## 2018-02-27 DIAGNOSIS — R4701 Aphasia: Secondary | ICD-10-CM | POA: Diagnosis not present

## 2018-02-27 DIAGNOSIS — R41841 Cognitive communication deficit: Secondary | ICD-10-CM | POA: Diagnosis not present

## 2018-02-27 DIAGNOSIS — F419 Anxiety disorder, unspecified: Secondary | ICD-10-CM | POA: Diagnosis not present

## 2018-02-27 DIAGNOSIS — R278 Other lack of coordination: Secondary | ICD-10-CM | POA: Diagnosis not present

## 2018-02-28 DIAGNOSIS — M6281 Muscle weakness (generalized): Secondary | ICD-10-CM | POA: Diagnosis not present

## 2018-02-28 DIAGNOSIS — R278 Other lack of coordination: Secondary | ICD-10-CM | POA: Diagnosis not present

## 2018-02-28 DIAGNOSIS — I480 Paroxysmal atrial fibrillation: Secondary | ICD-10-CM | POA: Diagnosis not present

## 2018-02-28 DIAGNOSIS — F332 Major depressive disorder, recurrent severe without psychotic features: Secondary | ICD-10-CM | POA: Diagnosis not present

## 2018-02-28 DIAGNOSIS — F0390 Unspecified dementia without behavioral disturbance: Secondary | ICD-10-CM | POA: Diagnosis not present

## 2018-02-28 DIAGNOSIS — F419 Anxiety disorder, unspecified: Secondary | ICD-10-CM | POA: Diagnosis not present

## 2018-02-28 DIAGNOSIS — Z7901 Long term (current) use of anticoagulants: Secondary | ICD-10-CM | POA: Diagnosis not present

## 2018-02-28 DIAGNOSIS — R41841 Cognitive communication deficit: Secondary | ICD-10-CM | POA: Diagnosis not present

## 2018-02-28 DIAGNOSIS — R4701 Aphasia: Secondary | ICD-10-CM | POA: Diagnosis not present

## 2018-03-01 DIAGNOSIS — F0390 Unspecified dementia without behavioral disturbance: Secondary | ICD-10-CM | POA: Diagnosis not present

## 2018-03-01 DIAGNOSIS — Z7901 Long term (current) use of anticoagulants: Secondary | ICD-10-CM | POA: Diagnosis not present

## 2018-03-01 DIAGNOSIS — R41841 Cognitive communication deficit: Secondary | ICD-10-CM | POA: Diagnosis not present

## 2018-03-01 DIAGNOSIS — F419 Anxiety disorder, unspecified: Secondary | ICD-10-CM | POA: Diagnosis not present

## 2018-03-01 DIAGNOSIS — M6281 Muscle weakness (generalized): Secondary | ICD-10-CM | POA: Diagnosis not present

## 2018-03-01 DIAGNOSIS — F332 Major depressive disorder, recurrent severe without psychotic features: Secondary | ICD-10-CM | POA: Diagnosis not present

## 2018-03-01 DIAGNOSIS — R278 Other lack of coordination: Secondary | ICD-10-CM | POA: Diagnosis not present

## 2018-03-01 DIAGNOSIS — I480 Paroxysmal atrial fibrillation: Secondary | ICD-10-CM | POA: Diagnosis not present

## 2018-03-01 DIAGNOSIS — R4701 Aphasia: Secondary | ICD-10-CM | POA: Diagnosis not present

## 2018-03-02 DIAGNOSIS — Z7901 Long term (current) use of anticoagulants: Secondary | ICD-10-CM | POA: Diagnosis not present

## 2018-03-02 DIAGNOSIS — I480 Paroxysmal atrial fibrillation: Secondary | ICD-10-CM | POA: Diagnosis not present

## 2018-03-02 DIAGNOSIS — F0390 Unspecified dementia without behavioral disturbance: Secondary | ICD-10-CM | POA: Diagnosis not present

## 2018-03-02 DIAGNOSIS — R41841 Cognitive communication deficit: Secondary | ICD-10-CM | POA: Diagnosis not present

## 2018-03-02 DIAGNOSIS — R278 Other lack of coordination: Secondary | ICD-10-CM | POA: Diagnosis not present

## 2018-03-02 DIAGNOSIS — F332 Major depressive disorder, recurrent severe without psychotic features: Secondary | ICD-10-CM | POA: Diagnosis not present

## 2018-03-02 DIAGNOSIS — F419 Anxiety disorder, unspecified: Secondary | ICD-10-CM | POA: Diagnosis not present

## 2018-03-02 DIAGNOSIS — M6281 Muscle weakness (generalized): Secondary | ICD-10-CM | POA: Diagnosis not present

## 2018-03-02 DIAGNOSIS — R4701 Aphasia: Secondary | ICD-10-CM | POA: Diagnosis not present

## 2018-03-03 DIAGNOSIS — F419 Anxiety disorder, unspecified: Secondary | ICD-10-CM | POA: Diagnosis not present

## 2018-03-03 DIAGNOSIS — R278 Other lack of coordination: Secondary | ICD-10-CM | POA: Diagnosis not present

## 2018-03-03 DIAGNOSIS — F0390 Unspecified dementia without behavioral disturbance: Secondary | ICD-10-CM | POA: Diagnosis not present

## 2018-03-03 DIAGNOSIS — G9340 Encephalopathy, unspecified: Secondary | ICD-10-CM | POA: Diagnosis not present

## 2018-03-03 DIAGNOSIS — R4701 Aphasia: Secondary | ICD-10-CM | POA: Diagnosis not present

## 2018-03-03 DIAGNOSIS — F332 Major depressive disorder, recurrent severe without psychotic features: Secondary | ICD-10-CM | POA: Diagnosis not present

## 2018-03-03 DIAGNOSIS — M6281 Muscle weakness (generalized): Secondary | ICD-10-CM | POA: Diagnosis not present

## 2018-03-03 DIAGNOSIS — Z7901 Long term (current) use of anticoagulants: Secondary | ICD-10-CM | POA: Diagnosis not present

## 2018-03-03 DIAGNOSIS — R41841 Cognitive communication deficit: Secondary | ICD-10-CM | POA: Diagnosis not present

## 2018-03-06 DIAGNOSIS — M6281 Muscle weakness (generalized): Secondary | ICD-10-CM | POA: Diagnosis not present

## 2018-03-06 DIAGNOSIS — F419 Anxiety disorder, unspecified: Secondary | ICD-10-CM | POA: Diagnosis not present

## 2018-03-06 DIAGNOSIS — F332 Major depressive disorder, recurrent severe without psychotic features: Secondary | ICD-10-CM | POA: Diagnosis not present

## 2018-03-06 DIAGNOSIS — R4701 Aphasia: Secondary | ICD-10-CM | POA: Diagnosis not present

## 2018-03-06 DIAGNOSIS — Z7901 Long term (current) use of anticoagulants: Secondary | ICD-10-CM | POA: Diagnosis not present

## 2018-03-06 DIAGNOSIS — F0390 Unspecified dementia without behavioral disturbance: Secondary | ICD-10-CM | POA: Diagnosis not present

## 2018-03-06 DIAGNOSIS — R278 Other lack of coordination: Secondary | ICD-10-CM | POA: Diagnosis not present

## 2018-03-06 DIAGNOSIS — G9340 Encephalopathy, unspecified: Secondary | ICD-10-CM | POA: Diagnosis not present

## 2018-03-06 DIAGNOSIS — R41841 Cognitive communication deficit: Secondary | ICD-10-CM | POA: Diagnosis not present

## 2018-03-07 DIAGNOSIS — M6281 Muscle weakness (generalized): Secondary | ICD-10-CM | POA: Diagnosis not present

## 2018-03-07 DIAGNOSIS — F0390 Unspecified dementia without behavioral disturbance: Secondary | ICD-10-CM | POA: Diagnosis not present

## 2018-03-07 DIAGNOSIS — F419 Anxiety disorder, unspecified: Secondary | ICD-10-CM | POA: Diagnosis not present

## 2018-03-07 DIAGNOSIS — Z7901 Long term (current) use of anticoagulants: Secondary | ICD-10-CM | POA: Diagnosis not present

## 2018-03-07 DIAGNOSIS — G9340 Encephalopathy, unspecified: Secondary | ICD-10-CM | POA: Diagnosis not present

## 2018-03-07 DIAGNOSIS — R41841 Cognitive communication deficit: Secondary | ICD-10-CM | POA: Diagnosis not present

## 2018-03-07 DIAGNOSIS — F332 Major depressive disorder, recurrent severe without psychotic features: Secondary | ICD-10-CM | POA: Diagnosis not present

## 2018-03-07 DIAGNOSIS — R278 Other lack of coordination: Secondary | ICD-10-CM | POA: Diagnosis not present

## 2018-03-07 DIAGNOSIS — R4701 Aphasia: Secondary | ICD-10-CM | POA: Diagnosis not present

## 2018-03-08 DIAGNOSIS — M6281 Muscle weakness (generalized): Secondary | ICD-10-CM | POA: Diagnosis not present

## 2018-03-08 DIAGNOSIS — R278 Other lack of coordination: Secondary | ICD-10-CM | POA: Diagnosis not present

## 2018-03-08 DIAGNOSIS — R4701 Aphasia: Secondary | ICD-10-CM | POA: Diagnosis not present

## 2018-03-08 DIAGNOSIS — Z7901 Long term (current) use of anticoagulants: Secondary | ICD-10-CM | POA: Diagnosis not present

## 2018-03-08 DIAGNOSIS — G9340 Encephalopathy, unspecified: Secondary | ICD-10-CM | POA: Diagnosis not present

## 2018-03-08 DIAGNOSIS — R41841 Cognitive communication deficit: Secondary | ICD-10-CM | POA: Diagnosis not present

## 2018-03-08 DIAGNOSIS — F419 Anxiety disorder, unspecified: Secondary | ICD-10-CM | POA: Diagnosis not present

## 2018-03-08 DIAGNOSIS — F332 Major depressive disorder, recurrent severe without psychotic features: Secondary | ICD-10-CM | POA: Diagnosis not present

## 2018-03-08 DIAGNOSIS — F0390 Unspecified dementia without behavioral disturbance: Secondary | ICD-10-CM | POA: Diagnosis not present

## 2018-03-09 DIAGNOSIS — F332 Major depressive disorder, recurrent severe without psychotic features: Secondary | ICD-10-CM | POA: Diagnosis not present

## 2018-03-09 DIAGNOSIS — M6281 Muscle weakness (generalized): Secondary | ICD-10-CM | POA: Diagnosis not present

## 2018-03-09 DIAGNOSIS — R278 Other lack of coordination: Secondary | ICD-10-CM | POA: Diagnosis not present

## 2018-03-09 DIAGNOSIS — F0390 Unspecified dementia without behavioral disturbance: Secondary | ICD-10-CM | POA: Diagnosis not present

## 2018-03-09 DIAGNOSIS — F419 Anxiety disorder, unspecified: Secondary | ICD-10-CM | POA: Diagnosis not present

## 2018-03-09 DIAGNOSIS — R4701 Aphasia: Secondary | ICD-10-CM | POA: Diagnosis not present

## 2018-03-09 DIAGNOSIS — G9340 Encephalopathy, unspecified: Secondary | ICD-10-CM | POA: Diagnosis not present

## 2018-03-09 DIAGNOSIS — Z7901 Long term (current) use of anticoagulants: Secondary | ICD-10-CM | POA: Diagnosis not present

## 2018-03-09 DIAGNOSIS — R41841 Cognitive communication deficit: Secondary | ICD-10-CM | POA: Diagnosis not present

## 2018-03-10 DIAGNOSIS — R278 Other lack of coordination: Secondary | ICD-10-CM | POA: Diagnosis not present

## 2018-03-10 DIAGNOSIS — Z7901 Long term (current) use of anticoagulants: Secondary | ICD-10-CM | POA: Diagnosis not present

## 2018-03-10 DIAGNOSIS — G9340 Encephalopathy, unspecified: Secondary | ICD-10-CM | POA: Diagnosis not present

## 2018-03-10 DIAGNOSIS — F0390 Unspecified dementia without behavioral disturbance: Secondary | ICD-10-CM | POA: Diagnosis not present

## 2018-03-10 DIAGNOSIS — F419 Anxiety disorder, unspecified: Secondary | ICD-10-CM | POA: Diagnosis not present

## 2018-03-10 DIAGNOSIS — R41841 Cognitive communication deficit: Secondary | ICD-10-CM | POA: Diagnosis not present

## 2018-03-10 DIAGNOSIS — F332 Major depressive disorder, recurrent severe without psychotic features: Secondary | ICD-10-CM | POA: Diagnosis not present

## 2018-03-10 DIAGNOSIS — M6281 Muscle weakness (generalized): Secondary | ICD-10-CM | POA: Diagnosis not present

## 2018-03-10 DIAGNOSIS — R4701 Aphasia: Secondary | ICD-10-CM | POA: Diagnosis not present

## 2018-03-13 DIAGNOSIS — F0390 Unspecified dementia without behavioral disturbance: Secondary | ICD-10-CM | POA: Diagnosis not present

## 2018-03-13 DIAGNOSIS — M6281 Muscle weakness (generalized): Secondary | ICD-10-CM | POA: Diagnosis not present

## 2018-03-13 DIAGNOSIS — R278 Other lack of coordination: Secondary | ICD-10-CM | POA: Diagnosis not present

## 2018-03-13 DIAGNOSIS — R41841 Cognitive communication deficit: Secondary | ICD-10-CM | POA: Diagnosis not present

## 2018-03-13 DIAGNOSIS — F419 Anxiety disorder, unspecified: Secondary | ICD-10-CM | POA: Diagnosis not present

## 2018-03-13 DIAGNOSIS — G9340 Encephalopathy, unspecified: Secondary | ICD-10-CM | POA: Diagnosis not present

## 2018-03-13 DIAGNOSIS — R4701 Aphasia: Secondary | ICD-10-CM | POA: Diagnosis not present

## 2018-03-13 DIAGNOSIS — F332 Major depressive disorder, recurrent severe without psychotic features: Secondary | ICD-10-CM | POA: Diagnosis not present

## 2018-03-13 DIAGNOSIS — Z7901 Long term (current) use of anticoagulants: Secondary | ICD-10-CM | POA: Diagnosis not present

## 2018-03-14 DIAGNOSIS — R41841 Cognitive communication deficit: Secondary | ICD-10-CM | POA: Diagnosis not present

## 2018-03-14 DIAGNOSIS — F0390 Unspecified dementia without behavioral disturbance: Secondary | ICD-10-CM | POA: Diagnosis not present

## 2018-03-14 DIAGNOSIS — R4701 Aphasia: Secondary | ICD-10-CM | POA: Diagnosis not present

## 2018-03-14 DIAGNOSIS — Z7901 Long term (current) use of anticoagulants: Secondary | ICD-10-CM | POA: Diagnosis not present

## 2018-03-14 DIAGNOSIS — G9340 Encephalopathy, unspecified: Secondary | ICD-10-CM | POA: Diagnosis not present

## 2018-03-14 DIAGNOSIS — R278 Other lack of coordination: Secondary | ICD-10-CM | POA: Diagnosis not present

## 2018-03-14 DIAGNOSIS — F419 Anxiety disorder, unspecified: Secondary | ICD-10-CM | POA: Diagnosis not present

## 2018-03-14 DIAGNOSIS — M6281 Muscle weakness (generalized): Secondary | ICD-10-CM | POA: Diagnosis not present

## 2018-03-14 DIAGNOSIS — F332 Major depressive disorder, recurrent severe without psychotic features: Secondary | ICD-10-CM | POA: Diagnosis not present

## 2018-03-15 DIAGNOSIS — R278 Other lack of coordination: Secondary | ICD-10-CM | POA: Diagnosis not present

## 2018-03-15 DIAGNOSIS — R4701 Aphasia: Secondary | ICD-10-CM | POA: Diagnosis not present

## 2018-03-15 DIAGNOSIS — G9340 Encephalopathy, unspecified: Secondary | ICD-10-CM | POA: Diagnosis not present

## 2018-03-15 DIAGNOSIS — M6281 Muscle weakness (generalized): Secondary | ICD-10-CM | POA: Diagnosis not present

## 2018-03-15 DIAGNOSIS — F0390 Unspecified dementia without behavioral disturbance: Secondary | ICD-10-CM | POA: Diagnosis not present

## 2018-03-15 DIAGNOSIS — R41841 Cognitive communication deficit: Secondary | ICD-10-CM | POA: Diagnosis not present

## 2018-03-15 DIAGNOSIS — F419 Anxiety disorder, unspecified: Secondary | ICD-10-CM | POA: Diagnosis not present

## 2018-03-15 DIAGNOSIS — Z7901 Long term (current) use of anticoagulants: Secondary | ICD-10-CM | POA: Diagnosis not present

## 2018-03-15 DIAGNOSIS — F332 Major depressive disorder, recurrent severe without psychotic features: Secondary | ICD-10-CM | POA: Diagnosis not present

## 2018-03-16 DIAGNOSIS — R4701 Aphasia: Secondary | ICD-10-CM | POA: Diagnosis not present

## 2018-03-16 DIAGNOSIS — R278 Other lack of coordination: Secondary | ICD-10-CM | POA: Diagnosis not present

## 2018-03-16 DIAGNOSIS — F419 Anxiety disorder, unspecified: Secondary | ICD-10-CM | POA: Diagnosis not present

## 2018-03-16 DIAGNOSIS — Z7901 Long term (current) use of anticoagulants: Secondary | ICD-10-CM | POA: Diagnosis not present

## 2018-03-16 DIAGNOSIS — F332 Major depressive disorder, recurrent severe without psychotic features: Secondary | ICD-10-CM | POA: Diagnosis not present

## 2018-03-16 DIAGNOSIS — M6281 Muscle weakness (generalized): Secondary | ICD-10-CM | POA: Diagnosis not present

## 2018-03-16 DIAGNOSIS — G9340 Encephalopathy, unspecified: Secondary | ICD-10-CM | POA: Diagnosis not present

## 2018-03-16 DIAGNOSIS — R41841 Cognitive communication deficit: Secondary | ICD-10-CM | POA: Diagnosis not present

## 2018-03-16 DIAGNOSIS — F0390 Unspecified dementia without behavioral disturbance: Secondary | ICD-10-CM | POA: Diagnosis not present

## 2018-03-17 DIAGNOSIS — F0390 Unspecified dementia without behavioral disturbance: Secondary | ICD-10-CM | POA: Diagnosis not present

## 2018-03-17 DIAGNOSIS — R278 Other lack of coordination: Secondary | ICD-10-CM | POA: Diagnosis not present

## 2018-03-17 DIAGNOSIS — F419 Anxiety disorder, unspecified: Secondary | ICD-10-CM | POA: Diagnosis not present

## 2018-03-17 DIAGNOSIS — F332 Major depressive disorder, recurrent severe without psychotic features: Secondary | ICD-10-CM | POA: Diagnosis not present

## 2018-03-17 DIAGNOSIS — R41841 Cognitive communication deficit: Secondary | ICD-10-CM | POA: Diagnosis not present

## 2018-03-17 DIAGNOSIS — M6281 Muscle weakness (generalized): Secondary | ICD-10-CM | POA: Diagnosis not present

## 2018-03-17 DIAGNOSIS — G9340 Encephalopathy, unspecified: Secondary | ICD-10-CM | POA: Diagnosis not present

## 2018-03-17 DIAGNOSIS — Z7901 Long term (current) use of anticoagulants: Secondary | ICD-10-CM | POA: Diagnosis not present

## 2018-03-17 DIAGNOSIS — R4701 Aphasia: Secondary | ICD-10-CM | POA: Diagnosis not present

## 2018-03-20 DIAGNOSIS — F0390 Unspecified dementia without behavioral disturbance: Secondary | ICD-10-CM | POA: Diagnosis not present

## 2018-03-20 DIAGNOSIS — M6281 Muscle weakness (generalized): Secondary | ICD-10-CM | POA: Diagnosis not present

## 2018-03-20 DIAGNOSIS — R4701 Aphasia: Secondary | ICD-10-CM | POA: Diagnosis not present

## 2018-03-20 DIAGNOSIS — Z7901 Long term (current) use of anticoagulants: Secondary | ICD-10-CM | POA: Diagnosis not present

## 2018-03-20 DIAGNOSIS — R41841 Cognitive communication deficit: Secondary | ICD-10-CM | POA: Diagnosis not present

## 2018-03-20 DIAGNOSIS — F419 Anxiety disorder, unspecified: Secondary | ICD-10-CM | POA: Diagnosis not present

## 2018-03-20 DIAGNOSIS — R278 Other lack of coordination: Secondary | ICD-10-CM | POA: Diagnosis not present

## 2018-03-20 DIAGNOSIS — G9340 Encephalopathy, unspecified: Secondary | ICD-10-CM | POA: Diagnosis not present

## 2018-03-20 DIAGNOSIS — F332 Major depressive disorder, recurrent severe without psychotic features: Secondary | ICD-10-CM | POA: Diagnosis not present

## 2018-03-21 DIAGNOSIS — R278 Other lack of coordination: Secondary | ICD-10-CM | POA: Diagnosis not present

## 2018-03-21 DIAGNOSIS — F419 Anxiety disorder, unspecified: Secondary | ICD-10-CM | POA: Diagnosis not present

## 2018-03-21 DIAGNOSIS — F0390 Unspecified dementia without behavioral disturbance: Secondary | ICD-10-CM | POA: Diagnosis not present

## 2018-03-21 DIAGNOSIS — Z7901 Long term (current) use of anticoagulants: Secondary | ICD-10-CM | POA: Diagnosis not present

## 2018-03-21 DIAGNOSIS — R41841 Cognitive communication deficit: Secondary | ICD-10-CM | POA: Diagnosis not present

## 2018-03-21 DIAGNOSIS — G9340 Encephalopathy, unspecified: Secondary | ICD-10-CM | POA: Diagnosis not present

## 2018-03-21 DIAGNOSIS — M6281 Muscle weakness (generalized): Secondary | ICD-10-CM | POA: Diagnosis not present

## 2018-03-21 DIAGNOSIS — R4701 Aphasia: Secondary | ICD-10-CM | POA: Diagnosis not present

## 2018-03-21 DIAGNOSIS — F332 Major depressive disorder, recurrent severe without psychotic features: Secondary | ICD-10-CM | POA: Diagnosis not present

## 2018-03-28 DIAGNOSIS — Z79899 Other long term (current) drug therapy: Secondary | ICD-10-CM | POA: Diagnosis not present

## 2018-03-28 DIAGNOSIS — D649 Anemia, unspecified: Secondary | ICD-10-CM | POA: Diagnosis not present

## 2018-03-28 DIAGNOSIS — N390 Urinary tract infection, site not specified: Secondary | ICD-10-CM | POA: Diagnosis not present

## 2018-03-31 DIAGNOSIS — F419 Anxiety disorder, unspecified: Secondary | ICD-10-CM | POA: Diagnosis not present

## 2018-03-31 DIAGNOSIS — G301 Alzheimer's disease with late onset: Secondary | ICD-10-CM | POA: Diagnosis not present

## 2018-03-31 DIAGNOSIS — I10 Essential (primary) hypertension: Secondary | ICD-10-CM | POA: Diagnosis not present

## 2018-03-31 DIAGNOSIS — N390 Urinary tract infection, site not specified: Secondary | ICD-10-CM | POA: Diagnosis not present

## 2018-04-05 DIAGNOSIS — I4891 Unspecified atrial fibrillation: Secondary | ICD-10-CM | POA: Diagnosis not present

## 2018-04-05 DIAGNOSIS — I10 Essential (primary) hypertension: Secondary | ICD-10-CM | POA: Diagnosis not present

## 2018-04-05 DIAGNOSIS — M810 Age-related osteoporosis without current pathological fracture: Secondary | ICD-10-CM | POA: Diagnosis not present

## 2018-04-05 DIAGNOSIS — F0390 Unspecified dementia without behavioral disturbance: Secondary | ICD-10-CM | POA: Diagnosis not present

## 2018-04-05 DIAGNOSIS — E785 Hyperlipidemia, unspecified: Secondary | ICD-10-CM | POA: Diagnosis not present

## 2018-05-15 DIAGNOSIS — I10 Essential (primary) hypertension: Secondary | ICD-10-CM | POA: Diagnosis not present

## 2018-05-15 DIAGNOSIS — D649 Anemia, unspecified: Secondary | ICD-10-CM | POA: Diagnosis not present

## 2018-05-15 DIAGNOSIS — E785 Hyperlipidemia, unspecified: Secondary | ICD-10-CM | POA: Diagnosis not present

## 2018-05-24 DIAGNOSIS — E785 Hyperlipidemia, unspecified: Secondary | ICD-10-CM | POA: Diagnosis not present

## 2018-05-24 DIAGNOSIS — I4891 Unspecified atrial fibrillation: Secondary | ICD-10-CM | POA: Diagnosis not present

## 2018-05-24 DIAGNOSIS — M810 Age-related osteoporosis without current pathological fracture: Secondary | ICD-10-CM | POA: Diagnosis not present

## 2018-05-24 DIAGNOSIS — F0390 Unspecified dementia without behavioral disturbance: Secondary | ICD-10-CM | POA: Diagnosis not present

## 2018-06-07 DIAGNOSIS — F0280 Dementia in other diseases classified elsewhere without behavioral disturbance: Secondary | ICD-10-CM | POA: Diagnosis not present

## 2018-06-07 DIAGNOSIS — G301 Alzheimer's disease with late onset: Secondary | ICD-10-CM | POA: Diagnosis not present

## 2018-06-07 DIAGNOSIS — F419 Anxiety disorder, unspecified: Secondary | ICD-10-CM | POA: Diagnosis not present

## 2018-06-07 DIAGNOSIS — F0390 Unspecified dementia without behavioral disturbance: Secondary | ICD-10-CM | POA: Diagnosis not present

## 2018-06-07 DIAGNOSIS — F332 Major depressive disorder, recurrent severe without psychotic features: Secondary | ICD-10-CM | POA: Diagnosis not present

## 2018-06-08 DIAGNOSIS — N390 Urinary tract infection, site not specified: Secondary | ICD-10-CM | POA: Diagnosis not present

## 2018-06-08 DIAGNOSIS — R319 Hematuria, unspecified: Secondary | ICD-10-CM | POA: Diagnosis not present

## 2018-06-08 DIAGNOSIS — I10 Essential (primary) hypertension: Secondary | ICD-10-CM | POA: Diagnosis not present

## 2018-06-08 DIAGNOSIS — Z79899 Other long term (current) drug therapy: Secondary | ICD-10-CM | POA: Diagnosis not present

## 2018-06-08 DIAGNOSIS — D649 Anemia, unspecified: Secondary | ICD-10-CM | POA: Diagnosis not present

## 2018-06-14 DIAGNOSIS — F332 Major depressive disorder, recurrent severe without psychotic features: Secondary | ICD-10-CM | POA: Diagnosis not present

## 2018-06-14 DIAGNOSIS — F0280 Dementia in other diseases classified elsewhere without behavioral disturbance: Secondary | ICD-10-CM | POA: Diagnosis not present

## 2018-06-14 DIAGNOSIS — G301 Alzheimer's disease with late onset: Secondary | ICD-10-CM | POA: Diagnosis not present

## 2018-06-20 DIAGNOSIS — F332 Major depressive disorder, recurrent severe without psychotic features: Secondary | ICD-10-CM | POA: Diagnosis not present

## 2018-06-20 DIAGNOSIS — F419 Anxiety disorder, unspecified: Secondary | ICD-10-CM | POA: Diagnosis not present

## 2018-06-20 DIAGNOSIS — M6281 Muscle weakness (generalized): Secondary | ICD-10-CM | POA: Diagnosis not present

## 2018-06-20 DIAGNOSIS — F0390 Unspecified dementia without behavioral disturbance: Secondary | ICD-10-CM | POA: Diagnosis not present

## 2018-06-20 DIAGNOSIS — Z7901 Long term (current) use of anticoagulants: Secondary | ICD-10-CM | POA: Diagnosis not present

## 2018-06-20 DIAGNOSIS — R278 Other lack of coordination: Secondary | ICD-10-CM | POA: Diagnosis not present

## 2018-06-20 DIAGNOSIS — G9340 Encephalopathy, unspecified: Secondary | ICD-10-CM | POA: Diagnosis not present

## 2018-06-20 DIAGNOSIS — R4701 Aphasia: Secondary | ICD-10-CM | POA: Diagnosis not present

## 2018-06-20 DIAGNOSIS — R41841 Cognitive communication deficit: Secondary | ICD-10-CM | POA: Diagnosis not present

## 2018-06-21 DIAGNOSIS — F332 Major depressive disorder, recurrent severe without psychotic features: Secondary | ICD-10-CM | POA: Diagnosis not present

## 2018-06-21 DIAGNOSIS — F0280 Dementia in other diseases classified elsewhere without behavioral disturbance: Secondary | ICD-10-CM | POA: Diagnosis not present

## 2018-06-21 DIAGNOSIS — F0390 Unspecified dementia without behavioral disturbance: Secondary | ICD-10-CM | POA: Diagnosis not present

## 2018-06-21 DIAGNOSIS — Z7901 Long term (current) use of anticoagulants: Secondary | ICD-10-CM | POA: Diagnosis not present

## 2018-06-21 DIAGNOSIS — R4701 Aphasia: Secondary | ICD-10-CM | POA: Diagnosis not present

## 2018-06-21 DIAGNOSIS — R278 Other lack of coordination: Secondary | ICD-10-CM | POA: Diagnosis not present

## 2018-06-21 DIAGNOSIS — F419 Anxiety disorder, unspecified: Secondary | ICD-10-CM | POA: Diagnosis not present

## 2018-06-21 DIAGNOSIS — R41841 Cognitive communication deficit: Secondary | ICD-10-CM | POA: Diagnosis not present

## 2018-06-21 DIAGNOSIS — M6281 Muscle weakness (generalized): Secondary | ICD-10-CM | POA: Diagnosis not present

## 2018-06-21 DIAGNOSIS — G301 Alzheimer's disease with late onset: Secondary | ICD-10-CM | POA: Diagnosis not present

## 2018-06-21 DIAGNOSIS — G9340 Encephalopathy, unspecified: Secondary | ICD-10-CM | POA: Diagnosis not present

## 2018-06-22 DIAGNOSIS — F332 Major depressive disorder, recurrent severe without psychotic features: Secondary | ICD-10-CM | POA: Diagnosis not present

## 2018-06-22 DIAGNOSIS — M6281 Muscle weakness (generalized): Secondary | ICD-10-CM | POA: Diagnosis not present

## 2018-06-22 DIAGNOSIS — R41841 Cognitive communication deficit: Secondary | ICD-10-CM | POA: Diagnosis not present

## 2018-06-22 DIAGNOSIS — F419 Anxiety disorder, unspecified: Secondary | ICD-10-CM | POA: Diagnosis not present

## 2018-06-22 DIAGNOSIS — F0390 Unspecified dementia without behavioral disturbance: Secondary | ICD-10-CM | POA: Diagnosis not present

## 2018-06-22 DIAGNOSIS — G9340 Encephalopathy, unspecified: Secondary | ICD-10-CM | POA: Diagnosis not present

## 2018-06-22 DIAGNOSIS — R4701 Aphasia: Secondary | ICD-10-CM | POA: Diagnosis not present

## 2018-06-22 DIAGNOSIS — Z7901 Long term (current) use of anticoagulants: Secondary | ICD-10-CM | POA: Diagnosis not present

## 2018-06-22 DIAGNOSIS — R278 Other lack of coordination: Secondary | ICD-10-CM | POA: Diagnosis not present

## 2018-06-23 DIAGNOSIS — G9340 Encephalopathy, unspecified: Secondary | ICD-10-CM | POA: Diagnosis not present

## 2018-06-23 DIAGNOSIS — M6281 Muscle weakness (generalized): Secondary | ICD-10-CM | POA: Diagnosis not present

## 2018-06-23 DIAGNOSIS — R41841 Cognitive communication deficit: Secondary | ICD-10-CM | POA: Diagnosis not present

## 2018-06-23 DIAGNOSIS — R4701 Aphasia: Secondary | ICD-10-CM | POA: Diagnosis not present

## 2018-06-23 DIAGNOSIS — Z7901 Long term (current) use of anticoagulants: Secondary | ICD-10-CM | POA: Diagnosis not present

## 2018-06-23 DIAGNOSIS — R278 Other lack of coordination: Secondary | ICD-10-CM | POA: Diagnosis not present

## 2018-06-23 DIAGNOSIS — F332 Major depressive disorder, recurrent severe without psychotic features: Secondary | ICD-10-CM | POA: Diagnosis not present

## 2018-06-23 DIAGNOSIS — F419 Anxiety disorder, unspecified: Secondary | ICD-10-CM | POA: Diagnosis not present

## 2018-06-23 DIAGNOSIS — F0390 Unspecified dementia without behavioral disturbance: Secondary | ICD-10-CM | POA: Diagnosis not present

## 2018-06-26 DIAGNOSIS — F332 Major depressive disorder, recurrent severe without psychotic features: Secondary | ICD-10-CM | POA: Diagnosis not present

## 2018-06-26 DIAGNOSIS — F0390 Unspecified dementia without behavioral disturbance: Secondary | ICD-10-CM | POA: Diagnosis not present

## 2018-06-26 DIAGNOSIS — F419 Anxiety disorder, unspecified: Secondary | ICD-10-CM | POA: Diagnosis not present

## 2018-06-26 DIAGNOSIS — R4701 Aphasia: Secondary | ICD-10-CM | POA: Diagnosis not present

## 2018-06-26 DIAGNOSIS — R278 Other lack of coordination: Secondary | ICD-10-CM | POA: Diagnosis not present

## 2018-06-26 DIAGNOSIS — R41841 Cognitive communication deficit: Secondary | ICD-10-CM | POA: Diagnosis not present

## 2018-06-26 DIAGNOSIS — Z7901 Long term (current) use of anticoagulants: Secondary | ICD-10-CM | POA: Diagnosis not present

## 2018-06-26 DIAGNOSIS — M6281 Muscle weakness (generalized): Secondary | ICD-10-CM | POA: Diagnosis not present

## 2018-06-26 DIAGNOSIS — G9340 Encephalopathy, unspecified: Secondary | ICD-10-CM | POA: Diagnosis not present

## 2018-06-27 DIAGNOSIS — G9340 Encephalopathy, unspecified: Secondary | ICD-10-CM | POA: Diagnosis not present

## 2018-06-27 DIAGNOSIS — R4701 Aphasia: Secondary | ICD-10-CM | POA: Diagnosis not present

## 2018-06-27 DIAGNOSIS — Z7901 Long term (current) use of anticoagulants: Secondary | ICD-10-CM | POA: Diagnosis not present

## 2018-06-27 DIAGNOSIS — F0390 Unspecified dementia without behavioral disturbance: Secondary | ICD-10-CM | POA: Diagnosis not present

## 2018-06-27 DIAGNOSIS — M6281 Muscle weakness (generalized): Secondary | ICD-10-CM | POA: Diagnosis not present

## 2018-06-27 DIAGNOSIS — R278 Other lack of coordination: Secondary | ICD-10-CM | POA: Diagnosis not present

## 2018-06-27 DIAGNOSIS — R41841 Cognitive communication deficit: Secondary | ICD-10-CM | POA: Diagnosis not present

## 2018-06-27 DIAGNOSIS — F332 Major depressive disorder, recurrent severe without psychotic features: Secondary | ICD-10-CM | POA: Diagnosis not present

## 2018-06-27 DIAGNOSIS — F419 Anxiety disorder, unspecified: Secondary | ICD-10-CM | POA: Diagnosis not present

## 2018-06-28 DIAGNOSIS — R41841 Cognitive communication deficit: Secondary | ICD-10-CM | POA: Diagnosis not present

## 2018-06-28 DIAGNOSIS — F0390 Unspecified dementia without behavioral disturbance: Secondary | ICD-10-CM | POA: Diagnosis not present

## 2018-06-28 DIAGNOSIS — R4701 Aphasia: Secondary | ICD-10-CM | POA: Diagnosis not present

## 2018-06-28 DIAGNOSIS — F332 Major depressive disorder, recurrent severe without psychotic features: Secondary | ICD-10-CM | POA: Diagnosis not present

## 2018-06-28 DIAGNOSIS — R278 Other lack of coordination: Secondary | ICD-10-CM | POA: Diagnosis not present

## 2018-06-28 DIAGNOSIS — M6281 Muscle weakness (generalized): Secondary | ICD-10-CM | POA: Diagnosis not present

## 2018-06-28 DIAGNOSIS — G9340 Encephalopathy, unspecified: Secondary | ICD-10-CM | POA: Diagnosis not present

## 2018-06-28 DIAGNOSIS — F419 Anxiety disorder, unspecified: Secondary | ICD-10-CM | POA: Diagnosis not present

## 2018-06-28 DIAGNOSIS — Z7901 Long term (current) use of anticoagulants: Secondary | ICD-10-CM | POA: Diagnosis not present

## 2018-06-29 DIAGNOSIS — Z7901 Long term (current) use of anticoagulants: Secondary | ICD-10-CM | POA: Diagnosis not present

## 2018-06-29 DIAGNOSIS — M6281 Muscle weakness (generalized): Secondary | ICD-10-CM | POA: Diagnosis not present

## 2018-06-29 DIAGNOSIS — R4701 Aphasia: Secondary | ICD-10-CM | POA: Diagnosis not present

## 2018-06-29 DIAGNOSIS — R41841 Cognitive communication deficit: Secondary | ICD-10-CM | POA: Diagnosis not present

## 2018-06-29 DIAGNOSIS — F0390 Unspecified dementia without behavioral disturbance: Secondary | ICD-10-CM | POA: Diagnosis not present

## 2018-06-29 DIAGNOSIS — G9340 Encephalopathy, unspecified: Secondary | ICD-10-CM | POA: Diagnosis not present

## 2018-06-29 DIAGNOSIS — F332 Major depressive disorder, recurrent severe without psychotic features: Secondary | ICD-10-CM | POA: Diagnosis not present

## 2018-06-29 DIAGNOSIS — F419 Anxiety disorder, unspecified: Secondary | ICD-10-CM | POA: Diagnosis not present

## 2018-06-29 DIAGNOSIS — R278 Other lack of coordination: Secondary | ICD-10-CM | POA: Diagnosis not present

## 2018-06-30 DIAGNOSIS — F419 Anxiety disorder, unspecified: Secondary | ICD-10-CM | POA: Diagnosis not present

## 2018-06-30 DIAGNOSIS — G9340 Encephalopathy, unspecified: Secondary | ICD-10-CM | POA: Diagnosis not present

## 2018-06-30 DIAGNOSIS — F0390 Unspecified dementia without behavioral disturbance: Secondary | ICD-10-CM | POA: Diagnosis not present

## 2018-06-30 DIAGNOSIS — M6281 Muscle weakness (generalized): Secondary | ICD-10-CM | POA: Diagnosis not present

## 2018-06-30 DIAGNOSIS — Z7901 Long term (current) use of anticoagulants: Secondary | ICD-10-CM | POA: Diagnosis not present

## 2018-06-30 DIAGNOSIS — F332 Major depressive disorder, recurrent severe without psychotic features: Secondary | ICD-10-CM | POA: Diagnosis not present

## 2018-06-30 DIAGNOSIS — Z79899 Other long term (current) drug therapy: Secondary | ICD-10-CM | POA: Diagnosis not present

## 2018-06-30 DIAGNOSIS — R278 Other lack of coordination: Secondary | ICD-10-CM | POA: Diagnosis not present

## 2018-06-30 DIAGNOSIS — R4701 Aphasia: Secondary | ICD-10-CM | POA: Diagnosis not present

## 2018-06-30 DIAGNOSIS — R569 Unspecified convulsions: Secondary | ICD-10-CM | POA: Diagnosis not present

## 2018-06-30 DIAGNOSIS — R41841 Cognitive communication deficit: Secondary | ICD-10-CM | POA: Diagnosis not present

## 2018-07-03 DIAGNOSIS — R4701 Aphasia: Secondary | ICD-10-CM | POA: Diagnosis not present

## 2018-07-03 DIAGNOSIS — G9340 Encephalopathy, unspecified: Secondary | ICD-10-CM | POA: Diagnosis not present

## 2018-07-03 DIAGNOSIS — F0390 Unspecified dementia without behavioral disturbance: Secondary | ICD-10-CM | POA: Diagnosis not present

## 2018-07-03 DIAGNOSIS — F419 Anxiety disorder, unspecified: Secondary | ICD-10-CM | POA: Diagnosis not present

## 2018-07-03 DIAGNOSIS — R278 Other lack of coordination: Secondary | ICD-10-CM | POA: Diagnosis not present

## 2018-07-03 DIAGNOSIS — Z7901 Long term (current) use of anticoagulants: Secondary | ICD-10-CM | POA: Diagnosis not present

## 2018-07-03 DIAGNOSIS — F332 Major depressive disorder, recurrent severe without psychotic features: Secondary | ICD-10-CM | POA: Diagnosis not present

## 2018-07-03 DIAGNOSIS — M6281 Muscle weakness (generalized): Secondary | ICD-10-CM | POA: Diagnosis not present

## 2018-07-03 DIAGNOSIS — R41841 Cognitive communication deficit: Secondary | ICD-10-CM | POA: Diagnosis not present

## 2018-07-04 DIAGNOSIS — G9340 Encephalopathy, unspecified: Secondary | ICD-10-CM | POA: Diagnosis not present

## 2018-07-04 DIAGNOSIS — F0390 Unspecified dementia without behavioral disturbance: Secondary | ICD-10-CM | POA: Diagnosis not present

## 2018-07-04 DIAGNOSIS — R4701 Aphasia: Secondary | ICD-10-CM | POA: Diagnosis not present

## 2018-07-04 DIAGNOSIS — R278 Other lack of coordination: Secondary | ICD-10-CM | POA: Diagnosis not present

## 2018-07-04 DIAGNOSIS — M6281 Muscle weakness (generalized): Secondary | ICD-10-CM | POA: Diagnosis not present

## 2018-07-04 DIAGNOSIS — R41841 Cognitive communication deficit: Secondary | ICD-10-CM | POA: Diagnosis not present

## 2018-07-04 DIAGNOSIS — F419 Anxiety disorder, unspecified: Secondary | ICD-10-CM | POA: Diagnosis not present

## 2018-07-04 DIAGNOSIS — Z7901 Long term (current) use of anticoagulants: Secondary | ICD-10-CM | POA: Diagnosis not present

## 2018-07-04 DIAGNOSIS — F332 Major depressive disorder, recurrent severe without psychotic features: Secondary | ICD-10-CM | POA: Diagnosis not present

## 2018-07-05 DIAGNOSIS — Z7901 Long term (current) use of anticoagulants: Secondary | ICD-10-CM | POA: Diagnosis not present

## 2018-07-05 DIAGNOSIS — F0280 Dementia in other diseases classified elsewhere without behavioral disturbance: Secondary | ICD-10-CM | POA: Diagnosis not present

## 2018-07-05 DIAGNOSIS — R41841 Cognitive communication deficit: Secondary | ICD-10-CM | POA: Diagnosis not present

## 2018-07-05 DIAGNOSIS — G301 Alzheimer's disease with late onset: Secondary | ICD-10-CM | POA: Diagnosis not present

## 2018-07-05 DIAGNOSIS — F0390 Unspecified dementia without behavioral disturbance: Secondary | ICD-10-CM | POA: Diagnosis not present

## 2018-07-05 DIAGNOSIS — G9340 Encephalopathy, unspecified: Secondary | ICD-10-CM | POA: Diagnosis not present

## 2018-07-05 DIAGNOSIS — F419 Anxiety disorder, unspecified: Secondary | ICD-10-CM | POA: Diagnosis not present

## 2018-07-05 DIAGNOSIS — M6281 Muscle weakness (generalized): Secondary | ICD-10-CM | POA: Diagnosis not present

## 2018-07-05 DIAGNOSIS — R4701 Aphasia: Secondary | ICD-10-CM | POA: Diagnosis not present

## 2018-07-05 DIAGNOSIS — F332 Major depressive disorder, recurrent severe without psychotic features: Secondary | ICD-10-CM | POA: Diagnosis not present

## 2018-07-05 DIAGNOSIS — R278 Other lack of coordination: Secondary | ICD-10-CM | POA: Diagnosis not present

## 2018-07-06 DIAGNOSIS — F419 Anxiety disorder, unspecified: Secondary | ICD-10-CM | POA: Diagnosis not present

## 2018-07-06 DIAGNOSIS — R4701 Aphasia: Secondary | ICD-10-CM | POA: Diagnosis not present

## 2018-07-06 DIAGNOSIS — R41841 Cognitive communication deficit: Secondary | ICD-10-CM | POA: Diagnosis not present

## 2018-07-06 DIAGNOSIS — F0390 Unspecified dementia without behavioral disturbance: Secondary | ICD-10-CM | POA: Diagnosis not present

## 2018-07-06 DIAGNOSIS — G9340 Encephalopathy, unspecified: Secondary | ICD-10-CM | POA: Diagnosis not present

## 2018-07-06 DIAGNOSIS — F332 Major depressive disorder, recurrent severe without psychotic features: Secondary | ICD-10-CM | POA: Diagnosis not present

## 2018-07-06 DIAGNOSIS — Z7901 Long term (current) use of anticoagulants: Secondary | ICD-10-CM | POA: Diagnosis not present

## 2018-07-06 DIAGNOSIS — M6281 Muscle weakness (generalized): Secondary | ICD-10-CM | POA: Diagnosis not present

## 2018-07-06 DIAGNOSIS — R278 Other lack of coordination: Secondary | ICD-10-CM | POA: Diagnosis not present

## 2018-07-07 DIAGNOSIS — F0390 Unspecified dementia without behavioral disturbance: Secondary | ICD-10-CM | POA: Diagnosis not present

## 2018-07-07 DIAGNOSIS — F332 Major depressive disorder, recurrent severe without psychotic features: Secondary | ICD-10-CM | POA: Diagnosis not present

## 2018-07-07 DIAGNOSIS — R4701 Aphasia: Secondary | ICD-10-CM | POA: Diagnosis not present

## 2018-07-07 DIAGNOSIS — R278 Other lack of coordination: Secondary | ICD-10-CM | POA: Diagnosis not present

## 2018-07-07 DIAGNOSIS — R41841 Cognitive communication deficit: Secondary | ICD-10-CM | POA: Diagnosis not present

## 2018-07-07 DIAGNOSIS — Z7901 Long term (current) use of anticoagulants: Secondary | ICD-10-CM | POA: Diagnosis not present

## 2018-07-07 DIAGNOSIS — F419 Anxiety disorder, unspecified: Secondary | ICD-10-CM | POA: Diagnosis not present

## 2018-07-07 DIAGNOSIS — M6281 Muscle weakness (generalized): Secondary | ICD-10-CM | POA: Diagnosis not present

## 2018-07-07 DIAGNOSIS — G9340 Encephalopathy, unspecified: Secondary | ICD-10-CM | POA: Diagnosis not present

## 2018-07-10 DIAGNOSIS — M6281 Muscle weakness (generalized): Secondary | ICD-10-CM | POA: Diagnosis not present

## 2018-07-10 DIAGNOSIS — F419 Anxiety disorder, unspecified: Secondary | ICD-10-CM | POA: Diagnosis not present

## 2018-07-10 DIAGNOSIS — R41841 Cognitive communication deficit: Secondary | ICD-10-CM | POA: Diagnosis not present

## 2018-07-10 DIAGNOSIS — F332 Major depressive disorder, recurrent severe without psychotic features: Secondary | ICD-10-CM | POA: Diagnosis not present

## 2018-07-10 DIAGNOSIS — F0390 Unspecified dementia without behavioral disturbance: Secondary | ICD-10-CM | POA: Diagnosis not present

## 2018-07-10 DIAGNOSIS — G9340 Encephalopathy, unspecified: Secondary | ICD-10-CM | POA: Diagnosis not present

## 2018-07-10 DIAGNOSIS — R4701 Aphasia: Secondary | ICD-10-CM | POA: Diagnosis not present

## 2018-07-10 DIAGNOSIS — Z7901 Long term (current) use of anticoagulants: Secondary | ICD-10-CM | POA: Diagnosis not present

## 2018-07-10 DIAGNOSIS — R278 Other lack of coordination: Secondary | ICD-10-CM | POA: Diagnosis not present

## 2018-07-11 DIAGNOSIS — F0390 Unspecified dementia without behavioral disturbance: Secondary | ICD-10-CM | POA: Diagnosis not present

## 2018-07-11 DIAGNOSIS — Z7901 Long term (current) use of anticoagulants: Secondary | ICD-10-CM | POA: Diagnosis not present

## 2018-07-11 DIAGNOSIS — R278 Other lack of coordination: Secondary | ICD-10-CM | POA: Diagnosis not present

## 2018-07-11 DIAGNOSIS — F332 Major depressive disorder, recurrent severe without psychotic features: Secondary | ICD-10-CM | POA: Diagnosis not present

## 2018-07-11 DIAGNOSIS — R41841 Cognitive communication deficit: Secondary | ICD-10-CM | POA: Diagnosis not present

## 2018-07-11 DIAGNOSIS — F419 Anxiety disorder, unspecified: Secondary | ICD-10-CM | POA: Diagnosis not present

## 2018-07-11 DIAGNOSIS — M6281 Muscle weakness (generalized): Secondary | ICD-10-CM | POA: Diagnosis not present

## 2018-07-11 DIAGNOSIS — G9340 Encephalopathy, unspecified: Secondary | ICD-10-CM | POA: Diagnosis not present

## 2018-07-11 DIAGNOSIS — R4701 Aphasia: Secondary | ICD-10-CM | POA: Diagnosis not present

## 2018-07-12 DIAGNOSIS — R278 Other lack of coordination: Secondary | ICD-10-CM | POA: Diagnosis not present

## 2018-07-12 DIAGNOSIS — Z7901 Long term (current) use of anticoagulants: Secondary | ICD-10-CM | POA: Diagnosis not present

## 2018-07-12 DIAGNOSIS — R41841 Cognitive communication deficit: Secondary | ICD-10-CM | POA: Diagnosis not present

## 2018-07-12 DIAGNOSIS — R4701 Aphasia: Secondary | ICD-10-CM | POA: Diagnosis not present

## 2018-07-12 DIAGNOSIS — G9340 Encephalopathy, unspecified: Secondary | ICD-10-CM | POA: Diagnosis not present

## 2018-07-12 DIAGNOSIS — M6281 Muscle weakness (generalized): Secondary | ICD-10-CM | POA: Diagnosis not present

## 2018-07-12 DIAGNOSIS — F419 Anxiety disorder, unspecified: Secondary | ICD-10-CM | POA: Diagnosis not present

## 2018-07-12 DIAGNOSIS — F0390 Unspecified dementia without behavioral disturbance: Secondary | ICD-10-CM | POA: Diagnosis not present

## 2018-07-12 DIAGNOSIS — F332 Major depressive disorder, recurrent severe without psychotic features: Secondary | ICD-10-CM | POA: Diagnosis not present

## 2018-07-13 DIAGNOSIS — F332 Major depressive disorder, recurrent severe without psychotic features: Secondary | ICD-10-CM | POA: Diagnosis not present

## 2018-07-13 DIAGNOSIS — F419 Anxiety disorder, unspecified: Secondary | ICD-10-CM | POA: Diagnosis not present

## 2018-07-13 DIAGNOSIS — F0390 Unspecified dementia without behavioral disturbance: Secondary | ICD-10-CM | POA: Diagnosis not present

## 2018-07-13 DIAGNOSIS — G9340 Encephalopathy, unspecified: Secondary | ICD-10-CM | POA: Diagnosis not present

## 2018-07-13 DIAGNOSIS — R278 Other lack of coordination: Secondary | ICD-10-CM | POA: Diagnosis not present

## 2018-07-13 DIAGNOSIS — R41841 Cognitive communication deficit: Secondary | ICD-10-CM | POA: Diagnosis not present

## 2018-07-13 DIAGNOSIS — R4701 Aphasia: Secondary | ICD-10-CM | POA: Diagnosis not present

## 2018-07-13 DIAGNOSIS — M6281 Muscle weakness (generalized): Secondary | ICD-10-CM | POA: Diagnosis not present

## 2018-07-13 DIAGNOSIS — Z7901 Long term (current) use of anticoagulants: Secondary | ICD-10-CM | POA: Diagnosis not present

## 2018-07-14 DIAGNOSIS — F332 Major depressive disorder, recurrent severe without psychotic features: Secondary | ICD-10-CM | POA: Diagnosis not present

## 2018-07-14 DIAGNOSIS — F0390 Unspecified dementia without behavioral disturbance: Secondary | ICD-10-CM | POA: Diagnosis not present

## 2018-07-14 DIAGNOSIS — Z7901 Long term (current) use of anticoagulants: Secondary | ICD-10-CM | POA: Diagnosis not present

## 2018-07-14 DIAGNOSIS — R278 Other lack of coordination: Secondary | ICD-10-CM | POA: Diagnosis not present

## 2018-07-14 DIAGNOSIS — R41841 Cognitive communication deficit: Secondary | ICD-10-CM | POA: Diagnosis not present

## 2018-07-14 DIAGNOSIS — R4701 Aphasia: Secondary | ICD-10-CM | POA: Diagnosis not present

## 2018-07-14 DIAGNOSIS — G9340 Encephalopathy, unspecified: Secondary | ICD-10-CM | POA: Diagnosis not present

## 2018-07-14 DIAGNOSIS — M6281 Muscle weakness (generalized): Secondary | ICD-10-CM | POA: Diagnosis not present

## 2018-07-14 DIAGNOSIS — F419 Anxiety disorder, unspecified: Secondary | ICD-10-CM | POA: Diagnosis not present

## 2018-07-19 DIAGNOSIS — F0391 Unspecified dementia with behavioral disturbance: Secondary | ICD-10-CM | POA: Diagnosis not present

## 2018-07-19 DIAGNOSIS — F329 Major depressive disorder, single episode, unspecified: Secondary | ICD-10-CM | POA: Diagnosis not present

## 2018-07-19 DIAGNOSIS — M810 Age-related osteoporosis without current pathological fracture: Secondary | ICD-10-CM | POA: Diagnosis not present

## 2018-07-19 DIAGNOSIS — I10 Essential (primary) hypertension: Secondary | ICD-10-CM | POA: Diagnosis not present

## 2018-07-19 DIAGNOSIS — E785 Hyperlipidemia, unspecified: Secondary | ICD-10-CM | POA: Diagnosis not present

## 2018-07-19 DIAGNOSIS — I4891 Unspecified atrial fibrillation: Secondary | ICD-10-CM | POA: Diagnosis not present

## 2018-07-24 DIAGNOSIS — B351 Tinea unguium: Secondary | ICD-10-CM | POA: Diagnosis not present

## 2018-07-24 DIAGNOSIS — G301 Alzheimer's disease with late onset: Secondary | ICD-10-CM | POA: Diagnosis not present

## 2018-07-24 DIAGNOSIS — I10 Essential (primary) hypertension: Secondary | ICD-10-CM | POA: Diagnosis not present

## 2018-07-24 DIAGNOSIS — M79675 Pain in left toe(s): Secondary | ICD-10-CM | POA: Diagnosis not present

## 2018-07-26 DIAGNOSIS — F332 Major depressive disorder, recurrent severe without psychotic features: Secondary | ICD-10-CM | POA: Diagnosis not present

## 2018-07-26 DIAGNOSIS — F0390 Unspecified dementia without behavioral disturbance: Secondary | ICD-10-CM | POA: Diagnosis not present

## 2018-07-26 DIAGNOSIS — F419 Anxiety disorder, unspecified: Secondary | ICD-10-CM | POA: Diagnosis not present

## 2018-07-26 DIAGNOSIS — R41841 Cognitive communication deficit: Secondary | ICD-10-CM | POA: Diagnosis not present

## 2018-07-26 DIAGNOSIS — Z7901 Long term (current) use of anticoagulants: Secondary | ICD-10-CM | POA: Diagnosis not present

## 2018-07-26 DIAGNOSIS — M6281 Muscle weakness (generalized): Secondary | ICD-10-CM | POA: Diagnosis not present

## 2018-07-26 DIAGNOSIS — G9340 Encephalopathy, unspecified: Secondary | ICD-10-CM | POA: Diagnosis not present

## 2018-07-26 DIAGNOSIS — R278 Other lack of coordination: Secondary | ICD-10-CM | POA: Diagnosis not present

## 2018-07-26 DIAGNOSIS — R4701 Aphasia: Secondary | ICD-10-CM | POA: Diagnosis not present

## 2018-07-27 DIAGNOSIS — M6281 Muscle weakness (generalized): Secondary | ICD-10-CM | POA: Diagnosis not present

## 2018-07-27 DIAGNOSIS — R4701 Aphasia: Secondary | ICD-10-CM | POA: Diagnosis not present

## 2018-07-27 DIAGNOSIS — F332 Major depressive disorder, recurrent severe without psychotic features: Secondary | ICD-10-CM | POA: Diagnosis not present

## 2018-07-27 DIAGNOSIS — F419 Anxiety disorder, unspecified: Secondary | ICD-10-CM | POA: Diagnosis not present

## 2018-07-27 DIAGNOSIS — R41841 Cognitive communication deficit: Secondary | ICD-10-CM | POA: Diagnosis not present

## 2018-07-27 DIAGNOSIS — G9340 Encephalopathy, unspecified: Secondary | ICD-10-CM | POA: Diagnosis not present

## 2018-07-27 DIAGNOSIS — Z7901 Long term (current) use of anticoagulants: Secondary | ICD-10-CM | POA: Diagnosis not present

## 2018-07-27 DIAGNOSIS — R278 Other lack of coordination: Secondary | ICD-10-CM | POA: Diagnosis not present

## 2018-07-27 DIAGNOSIS — F0390 Unspecified dementia without behavioral disturbance: Secondary | ICD-10-CM | POA: Diagnosis not present

## 2018-07-28 DIAGNOSIS — R4701 Aphasia: Secondary | ICD-10-CM | POA: Diagnosis not present

## 2018-07-28 DIAGNOSIS — F0390 Unspecified dementia without behavioral disturbance: Secondary | ICD-10-CM | POA: Diagnosis not present

## 2018-07-28 DIAGNOSIS — R41841 Cognitive communication deficit: Secondary | ICD-10-CM | POA: Diagnosis not present

## 2018-07-28 DIAGNOSIS — F332 Major depressive disorder, recurrent severe without psychotic features: Secondary | ICD-10-CM | POA: Diagnosis not present

## 2018-07-28 DIAGNOSIS — G9340 Encephalopathy, unspecified: Secondary | ICD-10-CM | POA: Diagnosis not present

## 2018-07-28 DIAGNOSIS — Z7901 Long term (current) use of anticoagulants: Secondary | ICD-10-CM | POA: Diagnosis not present

## 2018-07-28 DIAGNOSIS — F419 Anxiety disorder, unspecified: Secondary | ICD-10-CM | POA: Diagnosis not present

## 2018-07-28 DIAGNOSIS — M6281 Muscle weakness (generalized): Secondary | ICD-10-CM | POA: Diagnosis not present

## 2018-07-28 DIAGNOSIS — R278 Other lack of coordination: Secondary | ICD-10-CM | POA: Diagnosis not present

## 2018-07-31 DIAGNOSIS — F419 Anxiety disorder, unspecified: Secondary | ICD-10-CM | POA: Diagnosis not present

## 2018-07-31 DIAGNOSIS — G9340 Encephalopathy, unspecified: Secondary | ICD-10-CM | POA: Diagnosis not present

## 2018-07-31 DIAGNOSIS — F332 Major depressive disorder, recurrent severe without psychotic features: Secondary | ICD-10-CM | POA: Diagnosis not present

## 2018-07-31 DIAGNOSIS — M6281 Muscle weakness (generalized): Secondary | ICD-10-CM | POA: Diagnosis not present

## 2018-07-31 DIAGNOSIS — R278 Other lack of coordination: Secondary | ICD-10-CM | POA: Diagnosis not present

## 2018-07-31 DIAGNOSIS — R41841 Cognitive communication deficit: Secondary | ICD-10-CM | POA: Diagnosis not present

## 2018-07-31 DIAGNOSIS — R4701 Aphasia: Secondary | ICD-10-CM | POA: Diagnosis not present

## 2018-07-31 DIAGNOSIS — F0390 Unspecified dementia without behavioral disturbance: Secondary | ICD-10-CM | POA: Diagnosis not present

## 2018-07-31 DIAGNOSIS — Z7901 Long term (current) use of anticoagulants: Secondary | ICD-10-CM | POA: Diagnosis not present

## 2018-08-01 DIAGNOSIS — M6281 Muscle weakness (generalized): Secondary | ICD-10-CM | POA: Diagnosis not present

## 2018-08-01 DIAGNOSIS — F332 Major depressive disorder, recurrent severe without psychotic features: Secondary | ICD-10-CM | POA: Diagnosis not present

## 2018-08-01 DIAGNOSIS — G9340 Encephalopathy, unspecified: Secondary | ICD-10-CM | POA: Diagnosis not present

## 2018-08-01 DIAGNOSIS — Z7901 Long term (current) use of anticoagulants: Secondary | ICD-10-CM | POA: Diagnosis not present

## 2018-08-01 DIAGNOSIS — R278 Other lack of coordination: Secondary | ICD-10-CM | POA: Diagnosis not present

## 2018-08-01 DIAGNOSIS — F0390 Unspecified dementia without behavioral disturbance: Secondary | ICD-10-CM | POA: Diagnosis not present

## 2018-08-01 DIAGNOSIS — F419 Anxiety disorder, unspecified: Secondary | ICD-10-CM | POA: Diagnosis not present

## 2018-08-01 DIAGNOSIS — R4701 Aphasia: Secondary | ICD-10-CM | POA: Diagnosis not present

## 2018-08-01 DIAGNOSIS — R41841 Cognitive communication deficit: Secondary | ICD-10-CM | POA: Diagnosis not present

## 2018-08-02 DIAGNOSIS — G9340 Encephalopathy, unspecified: Secondary | ICD-10-CM | POA: Diagnosis not present

## 2018-08-02 DIAGNOSIS — F0280 Dementia in other diseases classified elsewhere without behavioral disturbance: Secondary | ICD-10-CM | POA: Diagnosis not present

## 2018-08-02 DIAGNOSIS — R278 Other lack of coordination: Secondary | ICD-10-CM | POA: Diagnosis not present

## 2018-08-02 DIAGNOSIS — R4701 Aphasia: Secondary | ICD-10-CM | POA: Diagnosis not present

## 2018-08-02 DIAGNOSIS — Z7901 Long term (current) use of anticoagulants: Secondary | ICD-10-CM | POA: Diagnosis not present

## 2018-08-02 DIAGNOSIS — F332 Major depressive disorder, recurrent severe without psychotic features: Secondary | ICD-10-CM | POA: Diagnosis not present

## 2018-08-02 DIAGNOSIS — R41841 Cognitive communication deficit: Secondary | ICD-10-CM | POA: Diagnosis not present

## 2018-08-02 DIAGNOSIS — F0632 Mood disorder due to known physiological condition with major depressive-like episode: Secondary | ICD-10-CM | POA: Diagnosis not present

## 2018-08-02 DIAGNOSIS — F0390 Unspecified dementia without behavioral disturbance: Secondary | ICD-10-CM | POA: Diagnosis not present

## 2018-08-02 DIAGNOSIS — G301 Alzheimer's disease with late onset: Secondary | ICD-10-CM | POA: Diagnosis not present

## 2018-08-02 DIAGNOSIS — M6281 Muscle weakness (generalized): Secondary | ICD-10-CM | POA: Diagnosis not present

## 2018-08-02 DIAGNOSIS — F419 Anxiety disorder, unspecified: Secondary | ICD-10-CM | POA: Diagnosis not present

## 2018-08-03 DIAGNOSIS — G9340 Encephalopathy, unspecified: Secondary | ICD-10-CM | POA: Diagnosis not present

## 2018-08-03 DIAGNOSIS — F419 Anxiety disorder, unspecified: Secondary | ICD-10-CM | POA: Diagnosis not present

## 2018-08-03 DIAGNOSIS — M6281 Muscle weakness (generalized): Secondary | ICD-10-CM | POA: Diagnosis not present

## 2018-08-03 DIAGNOSIS — Z7901 Long term (current) use of anticoagulants: Secondary | ICD-10-CM | POA: Diagnosis not present

## 2018-08-03 DIAGNOSIS — F0390 Unspecified dementia without behavioral disturbance: Secondary | ICD-10-CM | POA: Diagnosis not present

## 2018-08-03 DIAGNOSIS — R4701 Aphasia: Secondary | ICD-10-CM | POA: Diagnosis not present

## 2018-08-03 DIAGNOSIS — F332 Major depressive disorder, recurrent severe without psychotic features: Secondary | ICD-10-CM | POA: Diagnosis not present

## 2018-08-03 DIAGNOSIS — R278 Other lack of coordination: Secondary | ICD-10-CM | POA: Diagnosis not present

## 2018-08-03 DIAGNOSIS — R41841 Cognitive communication deficit: Secondary | ICD-10-CM | POA: Diagnosis not present

## 2018-08-04 DIAGNOSIS — F0390 Unspecified dementia without behavioral disturbance: Secondary | ICD-10-CM | POA: Diagnosis not present

## 2018-08-04 DIAGNOSIS — R4701 Aphasia: Secondary | ICD-10-CM | POA: Diagnosis not present

## 2018-08-04 DIAGNOSIS — F332 Major depressive disorder, recurrent severe without psychotic features: Secondary | ICD-10-CM | POA: Diagnosis not present

## 2018-08-04 DIAGNOSIS — R41841 Cognitive communication deficit: Secondary | ICD-10-CM | POA: Diagnosis not present

## 2018-08-04 DIAGNOSIS — G9340 Encephalopathy, unspecified: Secondary | ICD-10-CM | POA: Diagnosis not present

## 2018-08-04 DIAGNOSIS — Z7901 Long term (current) use of anticoagulants: Secondary | ICD-10-CM | POA: Diagnosis not present

## 2018-08-04 DIAGNOSIS — M6281 Muscle weakness (generalized): Secondary | ICD-10-CM | POA: Diagnosis not present

## 2018-08-04 DIAGNOSIS — F419 Anxiety disorder, unspecified: Secondary | ICD-10-CM | POA: Diagnosis not present

## 2018-08-04 DIAGNOSIS — R278 Other lack of coordination: Secondary | ICD-10-CM | POA: Diagnosis not present

## 2018-08-05 DIAGNOSIS — M6281 Muscle weakness (generalized): Secondary | ICD-10-CM | POA: Diagnosis not present

## 2018-08-05 DIAGNOSIS — G9340 Encephalopathy, unspecified: Secondary | ICD-10-CM | POA: Diagnosis not present

## 2018-08-05 DIAGNOSIS — F419 Anxiety disorder, unspecified: Secondary | ICD-10-CM | POA: Diagnosis not present

## 2018-08-05 DIAGNOSIS — R278 Other lack of coordination: Secondary | ICD-10-CM | POA: Diagnosis not present

## 2018-08-05 DIAGNOSIS — R41841 Cognitive communication deficit: Secondary | ICD-10-CM | POA: Diagnosis not present

## 2018-08-05 DIAGNOSIS — F332 Major depressive disorder, recurrent severe without psychotic features: Secondary | ICD-10-CM | POA: Diagnosis not present

## 2018-08-05 DIAGNOSIS — R4701 Aphasia: Secondary | ICD-10-CM | POA: Diagnosis not present

## 2018-08-05 DIAGNOSIS — F0390 Unspecified dementia without behavioral disturbance: Secondary | ICD-10-CM | POA: Diagnosis not present

## 2018-08-05 DIAGNOSIS — Z7901 Long term (current) use of anticoagulants: Secondary | ICD-10-CM | POA: Diagnosis not present

## 2018-08-07 DIAGNOSIS — R41841 Cognitive communication deficit: Secondary | ICD-10-CM | POA: Diagnosis not present

## 2018-08-07 DIAGNOSIS — R4701 Aphasia: Secondary | ICD-10-CM | POA: Diagnosis not present

## 2018-08-07 DIAGNOSIS — F419 Anxiety disorder, unspecified: Secondary | ICD-10-CM | POA: Diagnosis not present

## 2018-08-07 DIAGNOSIS — G9340 Encephalopathy, unspecified: Secondary | ICD-10-CM | POA: Diagnosis not present

## 2018-08-07 DIAGNOSIS — Z7901 Long term (current) use of anticoagulants: Secondary | ICD-10-CM | POA: Diagnosis not present

## 2018-08-07 DIAGNOSIS — F0390 Unspecified dementia without behavioral disturbance: Secondary | ICD-10-CM | POA: Diagnosis not present

## 2018-08-07 DIAGNOSIS — F332 Major depressive disorder, recurrent severe without psychotic features: Secondary | ICD-10-CM | POA: Diagnosis not present

## 2018-08-07 DIAGNOSIS — R278 Other lack of coordination: Secondary | ICD-10-CM | POA: Diagnosis not present

## 2018-08-07 DIAGNOSIS — M6281 Muscle weakness (generalized): Secondary | ICD-10-CM | POA: Diagnosis not present

## 2018-08-09 DIAGNOSIS — F0390 Unspecified dementia without behavioral disturbance: Secondary | ICD-10-CM | POA: Diagnosis not present

## 2018-08-09 DIAGNOSIS — Z7901 Long term (current) use of anticoagulants: Secondary | ICD-10-CM | POA: Diagnosis not present

## 2018-08-09 DIAGNOSIS — R451 Restlessness and agitation: Secondary | ICD-10-CM | POA: Diagnosis not present

## 2018-08-09 DIAGNOSIS — R41841 Cognitive communication deficit: Secondary | ICD-10-CM | POA: Diagnosis not present

## 2018-08-09 DIAGNOSIS — G9340 Encephalopathy, unspecified: Secondary | ICD-10-CM | POA: Diagnosis not present

## 2018-08-09 DIAGNOSIS — F329 Major depressive disorder, single episode, unspecified: Secondary | ICD-10-CM | POA: Diagnosis not present

## 2018-08-09 DIAGNOSIS — R531 Weakness: Secondary | ICD-10-CM | POA: Diagnosis not present

## 2018-08-09 DIAGNOSIS — F332 Major depressive disorder, recurrent severe without psychotic features: Secondary | ICD-10-CM | POA: Diagnosis not present

## 2018-08-09 DIAGNOSIS — F419 Anxiety disorder, unspecified: Secondary | ICD-10-CM | POA: Diagnosis not present

## 2018-08-09 DIAGNOSIS — G301 Alzheimer's disease with late onset: Secondary | ICD-10-CM | POA: Diagnosis not present

## 2018-08-09 DIAGNOSIS — R4701 Aphasia: Secondary | ICD-10-CM | POA: Diagnosis not present

## 2018-08-09 DIAGNOSIS — M6281 Muscle weakness (generalized): Secondary | ICD-10-CM | POA: Diagnosis not present

## 2018-08-09 DIAGNOSIS — R278 Other lack of coordination: Secondary | ICD-10-CM | POA: Diagnosis not present

## 2018-08-10 DIAGNOSIS — N390 Urinary tract infection, site not specified: Secondary | ICD-10-CM | POA: Diagnosis not present

## 2018-08-10 DIAGNOSIS — Z79899 Other long term (current) drug therapy: Secondary | ICD-10-CM | POA: Diagnosis not present

## 2018-08-10 DIAGNOSIS — R278 Other lack of coordination: Secondary | ICD-10-CM | POA: Diagnosis not present

## 2018-08-10 DIAGNOSIS — F332 Major depressive disorder, recurrent severe without psychotic features: Secondary | ICD-10-CM | POA: Diagnosis not present

## 2018-08-10 DIAGNOSIS — R319 Hematuria, unspecified: Secondary | ICD-10-CM | POA: Diagnosis not present

## 2018-08-10 DIAGNOSIS — Z7901 Long term (current) use of anticoagulants: Secondary | ICD-10-CM | POA: Diagnosis not present

## 2018-08-10 DIAGNOSIS — F0390 Unspecified dementia without behavioral disturbance: Secondary | ICD-10-CM | POA: Diagnosis not present

## 2018-08-10 DIAGNOSIS — G9340 Encephalopathy, unspecified: Secondary | ICD-10-CM | POA: Diagnosis not present

## 2018-08-10 DIAGNOSIS — R4701 Aphasia: Secondary | ICD-10-CM | POA: Diagnosis not present

## 2018-08-10 DIAGNOSIS — R41841 Cognitive communication deficit: Secondary | ICD-10-CM | POA: Diagnosis not present

## 2018-08-10 DIAGNOSIS — M6281 Muscle weakness (generalized): Secondary | ICD-10-CM | POA: Diagnosis not present

## 2018-08-10 DIAGNOSIS — F419 Anxiety disorder, unspecified: Secondary | ICD-10-CM | POA: Diagnosis not present

## 2018-08-11 DIAGNOSIS — Z7901 Long term (current) use of anticoagulants: Secondary | ICD-10-CM | POA: Diagnosis not present

## 2018-08-11 DIAGNOSIS — R41841 Cognitive communication deficit: Secondary | ICD-10-CM | POA: Diagnosis not present

## 2018-08-11 DIAGNOSIS — F419 Anxiety disorder, unspecified: Secondary | ICD-10-CM | POA: Diagnosis not present

## 2018-08-11 DIAGNOSIS — G9340 Encephalopathy, unspecified: Secondary | ICD-10-CM | POA: Diagnosis not present

## 2018-08-11 DIAGNOSIS — F0390 Unspecified dementia without behavioral disturbance: Secondary | ICD-10-CM | POA: Diagnosis not present

## 2018-08-11 DIAGNOSIS — M6281 Muscle weakness (generalized): Secondary | ICD-10-CM | POA: Diagnosis not present

## 2018-08-11 DIAGNOSIS — R4701 Aphasia: Secondary | ICD-10-CM | POA: Diagnosis not present

## 2018-08-11 DIAGNOSIS — R278 Other lack of coordination: Secondary | ICD-10-CM | POA: Diagnosis not present

## 2018-08-11 DIAGNOSIS — F332 Major depressive disorder, recurrent severe without psychotic features: Secondary | ICD-10-CM | POA: Diagnosis not present

## 2018-08-12 DIAGNOSIS — F419 Anxiety disorder, unspecified: Secondary | ICD-10-CM | POA: Diagnosis not present

## 2018-08-12 DIAGNOSIS — G9340 Encephalopathy, unspecified: Secondary | ICD-10-CM | POA: Diagnosis not present

## 2018-08-12 DIAGNOSIS — M6281 Muscle weakness (generalized): Secondary | ICD-10-CM | POA: Diagnosis not present

## 2018-08-12 DIAGNOSIS — R4701 Aphasia: Secondary | ICD-10-CM | POA: Diagnosis not present

## 2018-08-12 DIAGNOSIS — Z7901 Long term (current) use of anticoagulants: Secondary | ICD-10-CM | POA: Diagnosis not present

## 2018-08-12 DIAGNOSIS — R41841 Cognitive communication deficit: Secondary | ICD-10-CM | POA: Diagnosis not present

## 2018-08-12 DIAGNOSIS — F332 Major depressive disorder, recurrent severe without psychotic features: Secondary | ICD-10-CM | POA: Diagnosis not present

## 2018-08-12 DIAGNOSIS — F0390 Unspecified dementia without behavioral disturbance: Secondary | ICD-10-CM | POA: Diagnosis not present

## 2018-08-12 DIAGNOSIS — R278 Other lack of coordination: Secondary | ICD-10-CM | POA: Diagnosis not present

## 2018-08-14 DIAGNOSIS — R278 Other lack of coordination: Secondary | ICD-10-CM | POA: Diagnosis not present

## 2018-08-14 DIAGNOSIS — R4701 Aphasia: Secondary | ICD-10-CM | POA: Diagnosis not present

## 2018-08-14 DIAGNOSIS — Z7901 Long term (current) use of anticoagulants: Secondary | ICD-10-CM | POA: Diagnosis not present

## 2018-08-14 DIAGNOSIS — F332 Major depressive disorder, recurrent severe without psychotic features: Secondary | ICD-10-CM | POA: Diagnosis not present

## 2018-08-14 DIAGNOSIS — M6281 Muscle weakness (generalized): Secondary | ICD-10-CM | POA: Diagnosis not present

## 2018-08-14 DIAGNOSIS — R41841 Cognitive communication deficit: Secondary | ICD-10-CM | POA: Diagnosis not present

## 2018-08-14 DIAGNOSIS — G9340 Encephalopathy, unspecified: Secondary | ICD-10-CM | POA: Diagnosis not present

## 2018-08-14 DIAGNOSIS — F419 Anxiety disorder, unspecified: Secondary | ICD-10-CM | POA: Diagnosis not present

## 2018-08-14 DIAGNOSIS — F0390 Unspecified dementia without behavioral disturbance: Secondary | ICD-10-CM | POA: Diagnosis not present

## 2018-08-15 DIAGNOSIS — Z7901 Long term (current) use of anticoagulants: Secondary | ICD-10-CM | POA: Diagnosis not present

## 2018-08-15 DIAGNOSIS — F0390 Unspecified dementia without behavioral disturbance: Secondary | ICD-10-CM | POA: Diagnosis not present

## 2018-08-15 DIAGNOSIS — M6281 Muscle weakness (generalized): Secondary | ICD-10-CM | POA: Diagnosis not present

## 2018-08-15 DIAGNOSIS — R278 Other lack of coordination: Secondary | ICD-10-CM | POA: Diagnosis not present

## 2018-08-15 DIAGNOSIS — R4701 Aphasia: Secondary | ICD-10-CM | POA: Diagnosis not present

## 2018-08-15 DIAGNOSIS — R41841 Cognitive communication deficit: Secondary | ICD-10-CM | POA: Diagnosis not present

## 2018-08-15 DIAGNOSIS — G9340 Encephalopathy, unspecified: Secondary | ICD-10-CM | POA: Diagnosis not present

## 2018-08-15 DIAGNOSIS — F419 Anxiety disorder, unspecified: Secondary | ICD-10-CM | POA: Diagnosis not present

## 2018-08-15 DIAGNOSIS — F332 Major depressive disorder, recurrent severe without psychotic features: Secondary | ICD-10-CM | POA: Diagnosis not present

## 2018-08-16 DIAGNOSIS — F419 Anxiety disorder, unspecified: Secondary | ICD-10-CM | POA: Diagnosis not present

## 2018-08-16 DIAGNOSIS — F0280 Dementia in other diseases classified elsewhere without behavioral disturbance: Secondary | ICD-10-CM | POA: Diagnosis not present

## 2018-08-16 DIAGNOSIS — G9340 Encephalopathy, unspecified: Secondary | ICD-10-CM | POA: Diagnosis not present

## 2018-08-16 DIAGNOSIS — R41841 Cognitive communication deficit: Secondary | ICD-10-CM | POA: Diagnosis not present

## 2018-08-16 DIAGNOSIS — G301 Alzheimer's disease with late onset: Secondary | ICD-10-CM | POA: Diagnosis not present

## 2018-08-16 DIAGNOSIS — F0390 Unspecified dementia without behavioral disturbance: Secondary | ICD-10-CM | POA: Diagnosis not present

## 2018-08-16 DIAGNOSIS — R278 Other lack of coordination: Secondary | ICD-10-CM | POA: Diagnosis not present

## 2018-08-16 DIAGNOSIS — R4701 Aphasia: Secondary | ICD-10-CM | POA: Diagnosis not present

## 2018-08-16 DIAGNOSIS — F332 Major depressive disorder, recurrent severe without psychotic features: Secondary | ICD-10-CM | POA: Diagnosis not present

## 2018-08-16 DIAGNOSIS — M6281 Muscle weakness (generalized): Secondary | ICD-10-CM | POA: Diagnosis not present

## 2018-08-16 DIAGNOSIS — F0632 Mood disorder due to known physiological condition with major depressive-like episode: Secondary | ICD-10-CM | POA: Diagnosis not present

## 2018-08-16 DIAGNOSIS — Z7901 Long term (current) use of anticoagulants: Secondary | ICD-10-CM | POA: Diagnosis not present

## 2018-08-17 DIAGNOSIS — R278 Other lack of coordination: Secondary | ICD-10-CM | POA: Diagnosis not present

## 2018-08-17 DIAGNOSIS — Z7901 Long term (current) use of anticoagulants: Secondary | ICD-10-CM | POA: Diagnosis not present

## 2018-08-17 DIAGNOSIS — F0390 Unspecified dementia without behavioral disturbance: Secondary | ICD-10-CM | POA: Diagnosis not present

## 2018-08-17 DIAGNOSIS — G9340 Encephalopathy, unspecified: Secondary | ICD-10-CM | POA: Diagnosis not present

## 2018-08-17 DIAGNOSIS — R4701 Aphasia: Secondary | ICD-10-CM | POA: Diagnosis not present

## 2018-08-17 DIAGNOSIS — R41841 Cognitive communication deficit: Secondary | ICD-10-CM | POA: Diagnosis not present

## 2018-08-17 DIAGNOSIS — F332 Major depressive disorder, recurrent severe without psychotic features: Secondary | ICD-10-CM | POA: Diagnosis not present

## 2018-08-17 DIAGNOSIS — M6281 Muscle weakness (generalized): Secondary | ICD-10-CM | POA: Diagnosis not present

## 2018-08-17 DIAGNOSIS — F419 Anxiety disorder, unspecified: Secondary | ICD-10-CM | POA: Diagnosis not present

## 2018-08-18 DIAGNOSIS — R41841 Cognitive communication deficit: Secondary | ICD-10-CM | POA: Diagnosis not present

## 2018-08-18 DIAGNOSIS — F419 Anxiety disorder, unspecified: Secondary | ICD-10-CM | POA: Diagnosis not present

## 2018-08-18 DIAGNOSIS — Z7901 Long term (current) use of anticoagulants: Secondary | ICD-10-CM | POA: Diagnosis not present

## 2018-08-18 DIAGNOSIS — R278 Other lack of coordination: Secondary | ICD-10-CM | POA: Diagnosis not present

## 2018-08-18 DIAGNOSIS — F332 Major depressive disorder, recurrent severe without psychotic features: Secondary | ICD-10-CM | POA: Diagnosis not present

## 2018-08-18 DIAGNOSIS — F0390 Unspecified dementia without behavioral disturbance: Secondary | ICD-10-CM | POA: Diagnosis not present

## 2018-08-18 DIAGNOSIS — R4701 Aphasia: Secondary | ICD-10-CM | POA: Diagnosis not present

## 2018-08-18 DIAGNOSIS — M6281 Muscle weakness (generalized): Secondary | ICD-10-CM | POA: Diagnosis not present

## 2018-08-18 DIAGNOSIS — G9340 Encephalopathy, unspecified: Secondary | ICD-10-CM | POA: Diagnosis not present

## 2018-08-21 DIAGNOSIS — F0390 Unspecified dementia without behavioral disturbance: Secondary | ICD-10-CM | POA: Diagnosis not present

## 2018-08-21 DIAGNOSIS — M6281 Muscle weakness (generalized): Secondary | ICD-10-CM | POA: Diagnosis not present

## 2018-08-21 DIAGNOSIS — Z7901 Long term (current) use of anticoagulants: Secondary | ICD-10-CM | POA: Diagnosis not present

## 2018-08-21 DIAGNOSIS — R278 Other lack of coordination: Secondary | ICD-10-CM | POA: Diagnosis not present

## 2018-08-21 DIAGNOSIS — F332 Major depressive disorder, recurrent severe without psychotic features: Secondary | ICD-10-CM | POA: Diagnosis not present

## 2018-08-21 DIAGNOSIS — G9340 Encephalopathy, unspecified: Secondary | ICD-10-CM | POA: Diagnosis not present

## 2018-08-21 DIAGNOSIS — F419 Anxiety disorder, unspecified: Secondary | ICD-10-CM | POA: Diagnosis not present

## 2018-08-21 DIAGNOSIS — R41841 Cognitive communication deficit: Secondary | ICD-10-CM | POA: Diagnosis not present

## 2018-08-21 DIAGNOSIS — R4701 Aphasia: Secondary | ICD-10-CM | POA: Diagnosis not present

## 2018-08-22 DIAGNOSIS — R41841 Cognitive communication deficit: Secondary | ICD-10-CM | POA: Diagnosis not present

## 2018-08-22 DIAGNOSIS — F0390 Unspecified dementia without behavioral disturbance: Secondary | ICD-10-CM | POA: Diagnosis not present

## 2018-08-22 DIAGNOSIS — F332 Major depressive disorder, recurrent severe without psychotic features: Secondary | ICD-10-CM | POA: Diagnosis not present

## 2018-08-22 DIAGNOSIS — G9340 Encephalopathy, unspecified: Secondary | ICD-10-CM | POA: Diagnosis not present

## 2018-08-22 DIAGNOSIS — F419 Anxiety disorder, unspecified: Secondary | ICD-10-CM | POA: Diagnosis not present

## 2018-08-22 DIAGNOSIS — Z7901 Long term (current) use of anticoagulants: Secondary | ICD-10-CM | POA: Diagnosis not present

## 2018-08-22 DIAGNOSIS — R4701 Aphasia: Secondary | ICD-10-CM | POA: Diagnosis not present

## 2018-08-22 DIAGNOSIS — R278 Other lack of coordination: Secondary | ICD-10-CM | POA: Diagnosis not present

## 2018-08-22 DIAGNOSIS — M6281 Muscle weakness (generalized): Secondary | ICD-10-CM | POA: Diagnosis not present

## 2018-08-23 DIAGNOSIS — R4701 Aphasia: Secondary | ICD-10-CM | POA: Diagnosis not present

## 2018-08-23 DIAGNOSIS — F0632 Mood disorder due to known physiological condition with major depressive-like episode: Secondary | ICD-10-CM | POA: Diagnosis not present

## 2018-08-23 DIAGNOSIS — R41841 Cognitive communication deficit: Secondary | ICD-10-CM | POA: Diagnosis not present

## 2018-08-23 DIAGNOSIS — G9340 Encephalopathy, unspecified: Secondary | ICD-10-CM | POA: Diagnosis not present

## 2018-08-23 DIAGNOSIS — M6281 Muscle weakness (generalized): Secondary | ICD-10-CM | POA: Diagnosis not present

## 2018-08-23 DIAGNOSIS — G301 Alzheimer's disease with late onset: Secondary | ICD-10-CM | POA: Diagnosis not present

## 2018-08-23 DIAGNOSIS — F419 Anxiety disorder, unspecified: Secondary | ICD-10-CM | POA: Diagnosis not present

## 2018-08-23 DIAGNOSIS — Z7901 Long term (current) use of anticoagulants: Secondary | ICD-10-CM | POA: Diagnosis not present

## 2018-08-23 DIAGNOSIS — F0280 Dementia in other diseases classified elsewhere without behavioral disturbance: Secondary | ICD-10-CM | POA: Diagnosis not present

## 2018-08-23 DIAGNOSIS — R278 Other lack of coordination: Secondary | ICD-10-CM | POA: Diagnosis not present

## 2018-08-23 DIAGNOSIS — F332 Major depressive disorder, recurrent severe without psychotic features: Secondary | ICD-10-CM | POA: Diagnosis not present

## 2018-08-23 DIAGNOSIS — F0390 Unspecified dementia without behavioral disturbance: Secondary | ICD-10-CM | POA: Diagnosis not present

## 2018-08-24 DIAGNOSIS — R41841 Cognitive communication deficit: Secondary | ICD-10-CM | POA: Diagnosis not present

## 2018-08-24 DIAGNOSIS — R278 Other lack of coordination: Secondary | ICD-10-CM | POA: Diagnosis not present

## 2018-08-24 DIAGNOSIS — R4701 Aphasia: Secondary | ICD-10-CM | POA: Diagnosis not present

## 2018-08-24 DIAGNOSIS — F419 Anxiety disorder, unspecified: Secondary | ICD-10-CM | POA: Diagnosis not present

## 2018-08-24 DIAGNOSIS — M6281 Muscle weakness (generalized): Secondary | ICD-10-CM | POA: Diagnosis not present

## 2018-08-24 DIAGNOSIS — F0390 Unspecified dementia without behavioral disturbance: Secondary | ICD-10-CM | POA: Diagnosis not present

## 2018-08-24 DIAGNOSIS — F332 Major depressive disorder, recurrent severe without psychotic features: Secondary | ICD-10-CM | POA: Diagnosis not present

## 2018-08-24 DIAGNOSIS — G9340 Encephalopathy, unspecified: Secondary | ICD-10-CM | POA: Diagnosis not present

## 2018-08-24 DIAGNOSIS — Z7901 Long term (current) use of anticoagulants: Secondary | ICD-10-CM | POA: Diagnosis not present

## 2018-08-27 DIAGNOSIS — M6281 Muscle weakness (generalized): Secondary | ICD-10-CM | POA: Diagnosis not present

## 2018-08-27 DIAGNOSIS — Z7901 Long term (current) use of anticoagulants: Secondary | ICD-10-CM | POA: Diagnosis not present

## 2018-08-27 DIAGNOSIS — R41841 Cognitive communication deficit: Secondary | ICD-10-CM | POA: Diagnosis not present

## 2018-08-27 DIAGNOSIS — F0390 Unspecified dementia without behavioral disturbance: Secondary | ICD-10-CM | POA: Diagnosis not present

## 2018-08-27 DIAGNOSIS — G9340 Encephalopathy, unspecified: Secondary | ICD-10-CM | POA: Diagnosis not present

## 2018-08-27 DIAGNOSIS — R278 Other lack of coordination: Secondary | ICD-10-CM | POA: Diagnosis not present

## 2018-08-27 DIAGNOSIS — F332 Major depressive disorder, recurrent severe without psychotic features: Secondary | ICD-10-CM | POA: Diagnosis not present

## 2018-08-27 DIAGNOSIS — R4701 Aphasia: Secondary | ICD-10-CM | POA: Diagnosis not present

## 2018-08-27 DIAGNOSIS — F419 Anxiety disorder, unspecified: Secondary | ICD-10-CM | POA: Diagnosis not present

## 2018-08-28 DIAGNOSIS — F332 Major depressive disorder, recurrent severe without psychotic features: Secondary | ICD-10-CM | POA: Diagnosis not present

## 2018-08-28 DIAGNOSIS — R41841 Cognitive communication deficit: Secondary | ICD-10-CM | POA: Diagnosis not present

## 2018-08-28 DIAGNOSIS — G9340 Encephalopathy, unspecified: Secondary | ICD-10-CM | POA: Diagnosis not present

## 2018-08-28 DIAGNOSIS — Z7901 Long term (current) use of anticoagulants: Secondary | ICD-10-CM | POA: Diagnosis not present

## 2018-08-28 DIAGNOSIS — F0390 Unspecified dementia without behavioral disturbance: Secondary | ICD-10-CM | POA: Diagnosis not present

## 2018-08-28 DIAGNOSIS — R278 Other lack of coordination: Secondary | ICD-10-CM | POA: Diagnosis not present

## 2018-08-28 DIAGNOSIS — F419 Anxiety disorder, unspecified: Secondary | ICD-10-CM | POA: Diagnosis not present

## 2018-08-28 DIAGNOSIS — R4701 Aphasia: Secondary | ICD-10-CM | POA: Diagnosis not present

## 2018-08-28 DIAGNOSIS — M6281 Muscle weakness (generalized): Secondary | ICD-10-CM | POA: Diagnosis not present

## 2018-08-29 DIAGNOSIS — Z7901 Long term (current) use of anticoagulants: Secondary | ICD-10-CM | POA: Diagnosis not present

## 2018-08-29 DIAGNOSIS — M6281 Muscle weakness (generalized): Secondary | ICD-10-CM | POA: Diagnosis not present

## 2018-08-29 DIAGNOSIS — F332 Major depressive disorder, recurrent severe without psychotic features: Secondary | ICD-10-CM | POA: Diagnosis not present

## 2018-08-29 DIAGNOSIS — R41841 Cognitive communication deficit: Secondary | ICD-10-CM | POA: Diagnosis not present

## 2018-08-29 DIAGNOSIS — F0390 Unspecified dementia without behavioral disturbance: Secondary | ICD-10-CM | POA: Diagnosis not present

## 2018-08-29 DIAGNOSIS — R4701 Aphasia: Secondary | ICD-10-CM | POA: Diagnosis not present

## 2018-08-29 DIAGNOSIS — G9340 Encephalopathy, unspecified: Secondary | ICD-10-CM | POA: Diagnosis not present

## 2018-08-29 DIAGNOSIS — R278 Other lack of coordination: Secondary | ICD-10-CM | POA: Diagnosis not present

## 2018-08-29 DIAGNOSIS — F419 Anxiety disorder, unspecified: Secondary | ICD-10-CM | POA: Diagnosis not present

## 2018-08-30 DIAGNOSIS — Z7901 Long term (current) use of anticoagulants: Secondary | ICD-10-CM | POA: Diagnosis not present

## 2018-08-30 DIAGNOSIS — F419 Anxiety disorder, unspecified: Secondary | ICD-10-CM | POA: Diagnosis not present

## 2018-08-30 DIAGNOSIS — F332 Major depressive disorder, recurrent severe without psychotic features: Secondary | ICD-10-CM | POA: Diagnosis not present

## 2018-08-30 DIAGNOSIS — R4701 Aphasia: Secondary | ICD-10-CM | POA: Diagnosis not present

## 2018-08-30 DIAGNOSIS — M6281 Muscle weakness (generalized): Secondary | ICD-10-CM | POA: Diagnosis not present

## 2018-08-30 DIAGNOSIS — R278 Other lack of coordination: Secondary | ICD-10-CM | POA: Diagnosis not present

## 2018-08-30 DIAGNOSIS — G9340 Encephalopathy, unspecified: Secondary | ICD-10-CM | POA: Diagnosis not present

## 2018-08-30 DIAGNOSIS — R41841 Cognitive communication deficit: Secondary | ICD-10-CM | POA: Diagnosis not present

## 2018-08-30 DIAGNOSIS — F0390 Unspecified dementia without behavioral disturbance: Secondary | ICD-10-CM | POA: Diagnosis not present

## 2018-09-06 DIAGNOSIS — F332 Major depressive disorder, recurrent severe without psychotic features: Secondary | ICD-10-CM | POA: Diagnosis not present

## 2018-09-06 DIAGNOSIS — F0280 Dementia in other diseases classified elsewhere without behavioral disturbance: Secondary | ICD-10-CM | POA: Diagnosis not present

## 2018-09-06 DIAGNOSIS — E785 Hyperlipidemia, unspecified: Secondary | ICD-10-CM | POA: Diagnosis not present

## 2018-09-06 DIAGNOSIS — F0632 Mood disorder due to known physiological condition with major depressive-like episode: Secondary | ICD-10-CM | POA: Diagnosis not present

## 2018-09-06 DIAGNOSIS — F329 Major depressive disorder, single episode, unspecified: Secondary | ICD-10-CM | POA: Diagnosis not present

## 2018-09-06 DIAGNOSIS — I4891 Unspecified atrial fibrillation: Secondary | ICD-10-CM | POA: Diagnosis not present

## 2018-09-06 DIAGNOSIS — F0391 Unspecified dementia with behavioral disturbance: Secondary | ICD-10-CM | POA: Diagnosis not present

## 2018-09-06 DIAGNOSIS — M810 Age-related osteoporosis without current pathological fracture: Secondary | ICD-10-CM | POA: Diagnosis not present

## 2018-09-06 DIAGNOSIS — G301 Alzheimer's disease with late onset: Secondary | ICD-10-CM | POA: Diagnosis not present

## 2018-09-06 DIAGNOSIS — I10 Essential (primary) hypertension: Secondary | ICD-10-CM | POA: Diagnosis not present

## 2018-09-24 DIAGNOSIS — R062 Wheezing: Secondary | ICD-10-CM | POA: Diagnosis not present

## 2018-10-03 DIAGNOSIS — F332 Major depressive disorder, recurrent severe without psychotic features: Secondary | ICD-10-CM | POA: Diagnosis not present

## 2018-10-03 DIAGNOSIS — G301 Alzheimer's disease with late onset: Secondary | ICD-10-CM | POA: Diagnosis not present

## 2018-10-03 DIAGNOSIS — F0280 Dementia in other diseases classified elsewhere without behavioral disturbance: Secondary | ICD-10-CM | POA: Diagnosis not present

## 2018-10-03 DIAGNOSIS — F0632 Mood disorder due to known physiological condition with major depressive-like episode: Secondary | ICD-10-CM | POA: Diagnosis not present

## 2018-10-26 ENCOUNTER — Encounter: Payer: Self-pay | Admitting: *Deleted

## 2018-11-01 DIAGNOSIS — Z20828 Contact with and (suspected) exposure to other viral communicable diseases: Secondary | ICD-10-CM | POA: Diagnosis not present

## 2018-11-08 DIAGNOSIS — F332 Major depressive disorder, recurrent severe without psychotic features: Secondary | ICD-10-CM | POA: Diagnosis not present

## 2018-11-08 DIAGNOSIS — R2681 Unsteadiness on feet: Secondary | ICD-10-CM | POA: Diagnosis not present

## 2018-11-08 DIAGNOSIS — R4701 Aphasia: Secondary | ICD-10-CM | POA: Diagnosis not present

## 2018-11-08 DIAGNOSIS — Z20828 Contact with and (suspected) exposure to other viral communicable diseases: Secondary | ICD-10-CM | POA: Diagnosis not present

## 2018-11-08 DIAGNOSIS — F0390 Unspecified dementia without behavioral disturbance: Secondary | ICD-10-CM | POA: Diagnosis not present

## 2018-11-08 DIAGNOSIS — I4891 Unspecified atrial fibrillation: Secondary | ICD-10-CM | POA: Diagnosis not present

## 2018-11-08 DIAGNOSIS — F0391 Unspecified dementia with behavioral disturbance: Secondary | ICD-10-CM | POA: Diagnosis not present

## 2018-11-08 DIAGNOSIS — M6281 Muscle weakness (generalized): Secondary | ICD-10-CM | POA: Diagnosis not present

## 2018-11-08 DIAGNOSIS — M810 Age-related osteoporosis without current pathological fracture: Secondary | ICD-10-CM | POA: Diagnosis not present

## 2018-11-08 DIAGNOSIS — I10 Essential (primary) hypertension: Secondary | ICD-10-CM | POA: Diagnosis not present

## 2018-11-08 DIAGNOSIS — R278 Other lack of coordination: Secondary | ICD-10-CM | POA: Diagnosis not present

## 2018-11-08 DIAGNOSIS — F329 Major depressive disorder, single episode, unspecified: Secondary | ICD-10-CM | POA: Diagnosis not present

## 2018-11-08 DIAGNOSIS — Z7901 Long term (current) use of anticoagulants: Secondary | ICD-10-CM | POA: Diagnosis not present

## 2018-11-08 DIAGNOSIS — F419 Anxiety disorder, unspecified: Secondary | ICD-10-CM | POA: Diagnosis not present

## 2018-11-08 DIAGNOSIS — G9340 Encephalopathy, unspecified: Secondary | ICD-10-CM | POA: Diagnosis not present

## 2018-11-08 DIAGNOSIS — E785 Hyperlipidemia, unspecified: Secondary | ICD-10-CM | POA: Diagnosis not present

## 2018-11-09 DIAGNOSIS — G9340 Encephalopathy, unspecified: Secondary | ICD-10-CM | POA: Diagnosis not present

## 2018-11-09 DIAGNOSIS — R278 Other lack of coordination: Secondary | ICD-10-CM | POA: Diagnosis not present

## 2018-11-09 DIAGNOSIS — R2681 Unsteadiness on feet: Secondary | ICD-10-CM | POA: Diagnosis not present

## 2018-11-09 DIAGNOSIS — R4701 Aphasia: Secondary | ICD-10-CM | POA: Diagnosis not present

## 2018-11-09 DIAGNOSIS — F0390 Unspecified dementia without behavioral disturbance: Secondary | ICD-10-CM | POA: Diagnosis not present

## 2018-11-09 DIAGNOSIS — Z7901 Long term (current) use of anticoagulants: Secondary | ICD-10-CM | POA: Diagnosis not present

## 2018-11-09 DIAGNOSIS — F419 Anxiety disorder, unspecified: Secondary | ICD-10-CM | POA: Diagnosis not present

## 2018-11-09 DIAGNOSIS — F332 Major depressive disorder, recurrent severe without psychotic features: Secondary | ICD-10-CM | POA: Diagnosis not present

## 2018-11-09 DIAGNOSIS — M6281 Muscle weakness (generalized): Secondary | ICD-10-CM | POA: Diagnosis not present

## 2018-11-10 DIAGNOSIS — R2681 Unsteadiness on feet: Secondary | ICD-10-CM | POA: Diagnosis not present

## 2018-11-10 DIAGNOSIS — Z7901 Long term (current) use of anticoagulants: Secondary | ICD-10-CM | POA: Diagnosis not present

## 2018-11-10 DIAGNOSIS — F0390 Unspecified dementia without behavioral disturbance: Secondary | ICD-10-CM | POA: Diagnosis not present

## 2018-11-10 DIAGNOSIS — M6281 Muscle weakness (generalized): Secondary | ICD-10-CM | POA: Diagnosis not present

## 2018-11-10 DIAGNOSIS — R278 Other lack of coordination: Secondary | ICD-10-CM | POA: Diagnosis not present

## 2018-11-10 DIAGNOSIS — F332 Major depressive disorder, recurrent severe without psychotic features: Secondary | ICD-10-CM | POA: Diagnosis not present

## 2018-11-10 DIAGNOSIS — G9340 Encephalopathy, unspecified: Secondary | ICD-10-CM | POA: Diagnosis not present

## 2018-11-10 DIAGNOSIS — F419 Anxiety disorder, unspecified: Secondary | ICD-10-CM | POA: Diagnosis not present

## 2018-11-10 DIAGNOSIS — R4701 Aphasia: Secondary | ICD-10-CM | POA: Diagnosis not present

## 2018-11-13 DIAGNOSIS — R2681 Unsteadiness on feet: Secondary | ICD-10-CM | POA: Diagnosis not present

## 2018-11-13 DIAGNOSIS — G9340 Encephalopathy, unspecified: Secondary | ICD-10-CM | POA: Diagnosis not present

## 2018-11-13 DIAGNOSIS — M6281 Muscle weakness (generalized): Secondary | ICD-10-CM | POA: Diagnosis not present

## 2018-11-13 DIAGNOSIS — R278 Other lack of coordination: Secondary | ICD-10-CM | POA: Diagnosis not present

## 2018-11-13 DIAGNOSIS — Z7901 Long term (current) use of anticoagulants: Secondary | ICD-10-CM | POA: Diagnosis not present

## 2018-11-13 DIAGNOSIS — F332 Major depressive disorder, recurrent severe without psychotic features: Secondary | ICD-10-CM | POA: Diagnosis not present

## 2018-11-13 DIAGNOSIS — F0390 Unspecified dementia without behavioral disturbance: Secondary | ICD-10-CM | POA: Diagnosis not present

## 2018-11-13 DIAGNOSIS — F419 Anxiety disorder, unspecified: Secondary | ICD-10-CM | POA: Diagnosis not present

## 2018-11-13 DIAGNOSIS — R4701 Aphasia: Secondary | ICD-10-CM | POA: Diagnosis not present

## 2018-11-14 DIAGNOSIS — G9340 Encephalopathy, unspecified: Secondary | ICD-10-CM | POA: Diagnosis not present

## 2018-11-14 DIAGNOSIS — R4701 Aphasia: Secondary | ICD-10-CM | POA: Diagnosis not present

## 2018-11-14 DIAGNOSIS — R2681 Unsteadiness on feet: Secondary | ICD-10-CM | POA: Diagnosis not present

## 2018-11-14 DIAGNOSIS — F0390 Unspecified dementia without behavioral disturbance: Secondary | ICD-10-CM | POA: Diagnosis not present

## 2018-11-14 DIAGNOSIS — M6281 Muscle weakness (generalized): Secondary | ICD-10-CM | POA: Diagnosis not present

## 2018-11-14 DIAGNOSIS — R278 Other lack of coordination: Secondary | ICD-10-CM | POA: Diagnosis not present

## 2018-11-14 DIAGNOSIS — F419 Anxiety disorder, unspecified: Secondary | ICD-10-CM | POA: Diagnosis not present

## 2018-11-14 DIAGNOSIS — Z7901 Long term (current) use of anticoagulants: Secondary | ICD-10-CM | POA: Diagnosis not present

## 2018-11-14 DIAGNOSIS — F332 Major depressive disorder, recurrent severe without psychotic features: Secondary | ICD-10-CM | POA: Diagnosis not present

## 2018-11-15 DIAGNOSIS — F419 Anxiety disorder, unspecified: Secondary | ICD-10-CM | POA: Diagnosis not present

## 2018-11-15 DIAGNOSIS — R278 Other lack of coordination: Secondary | ICD-10-CM | POA: Diagnosis not present

## 2018-11-15 DIAGNOSIS — Z7901 Long term (current) use of anticoagulants: Secondary | ICD-10-CM | POA: Diagnosis not present

## 2018-11-15 DIAGNOSIS — M6281 Muscle weakness (generalized): Secondary | ICD-10-CM | POA: Diagnosis not present

## 2018-11-15 DIAGNOSIS — G9340 Encephalopathy, unspecified: Secondary | ICD-10-CM | POA: Diagnosis not present

## 2018-11-15 DIAGNOSIS — R2681 Unsteadiness on feet: Secondary | ICD-10-CM | POA: Diagnosis not present

## 2018-11-15 DIAGNOSIS — F332 Major depressive disorder, recurrent severe without psychotic features: Secondary | ICD-10-CM | POA: Diagnosis not present

## 2018-11-15 DIAGNOSIS — F0390 Unspecified dementia without behavioral disturbance: Secondary | ICD-10-CM | POA: Diagnosis not present

## 2018-11-15 DIAGNOSIS — Z20828 Contact with and (suspected) exposure to other viral communicable diseases: Secondary | ICD-10-CM | POA: Diagnosis not present

## 2018-11-15 DIAGNOSIS — R4701 Aphasia: Secondary | ICD-10-CM | POA: Diagnosis not present

## 2018-11-16 DIAGNOSIS — F332 Major depressive disorder, recurrent severe without psychotic features: Secondary | ICD-10-CM | POA: Diagnosis not present

## 2018-11-16 DIAGNOSIS — F419 Anxiety disorder, unspecified: Secondary | ICD-10-CM | POA: Diagnosis not present

## 2018-11-16 DIAGNOSIS — F0390 Unspecified dementia without behavioral disturbance: Secondary | ICD-10-CM | POA: Diagnosis not present

## 2018-11-16 DIAGNOSIS — M6281 Muscle weakness (generalized): Secondary | ICD-10-CM | POA: Diagnosis not present

## 2018-11-16 DIAGNOSIS — R2681 Unsteadiness on feet: Secondary | ICD-10-CM | POA: Diagnosis not present

## 2018-11-16 DIAGNOSIS — F0632 Mood disorder due to known physiological condition with major depressive-like episode: Secondary | ICD-10-CM | POA: Diagnosis not present

## 2018-11-16 DIAGNOSIS — F333 Major depressive disorder, recurrent, severe with psychotic symptoms: Secondary | ICD-10-CM | POA: Diagnosis not present

## 2018-11-16 DIAGNOSIS — G301 Alzheimer's disease with late onset: Secondary | ICD-10-CM | POA: Diagnosis not present

## 2018-11-16 DIAGNOSIS — Z7901 Long term (current) use of anticoagulants: Secondary | ICD-10-CM | POA: Diagnosis not present

## 2018-11-16 DIAGNOSIS — R4701 Aphasia: Secondary | ICD-10-CM | POA: Diagnosis not present

## 2018-11-16 DIAGNOSIS — G9340 Encephalopathy, unspecified: Secondary | ICD-10-CM | POA: Diagnosis not present

## 2018-11-16 DIAGNOSIS — R278 Other lack of coordination: Secondary | ICD-10-CM | POA: Diagnosis not present

## 2018-11-17 DIAGNOSIS — G9340 Encephalopathy, unspecified: Secondary | ICD-10-CM | POA: Diagnosis not present

## 2018-11-17 DIAGNOSIS — Z7901 Long term (current) use of anticoagulants: Secondary | ICD-10-CM | POA: Diagnosis not present

## 2018-11-17 DIAGNOSIS — F419 Anxiety disorder, unspecified: Secondary | ICD-10-CM | POA: Diagnosis not present

## 2018-11-17 DIAGNOSIS — R4701 Aphasia: Secondary | ICD-10-CM | POA: Diagnosis not present

## 2018-11-17 DIAGNOSIS — R278 Other lack of coordination: Secondary | ICD-10-CM | POA: Diagnosis not present

## 2018-11-17 DIAGNOSIS — R2681 Unsteadiness on feet: Secondary | ICD-10-CM | POA: Diagnosis not present

## 2018-11-17 DIAGNOSIS — F332 Major depressive disorder, recurrent severe without psychotic features: Secondary | ICD-10-CM | POA: Diagnosis not present

## 2018-11-17 DIAGNOSIS — F0390 Unspecified dementia without behavioral disturbance: Secondary | ICD-10-CM | POA: Diagnosis not present

## 2018-11-17 DIAGNOSIS — M6281 Muscle weakness (generalized): Secondary | ICD-10-CM | POA: Diagnosis not present

## 2018-11-20 DIAGNOSIS — Z7901 Long term (current) use of anticoagulants: Secondary | ICD-10-CM | POA: Diagnosis not present

## 2018-11-20 DIAGNOSIS — M6281 Muscle weakness (generalized): Secondary | ICD-10-CM | POA: Diagnosis not present

## 2018-11-20 DIAGNOSIS — G9340 Encephalopathy, unspecified: Secondary | ICD-10-CM | POA: Diagnosis not present

## 2018-11-20 DIAGNOSIS — F419 Anxiety disorder, unspecified: Secondary | ICD-10-CM | POA: Diagnosis not present

## 2018-11-20 DIAGNOSIS — R2681 Unsteadiness on feet: Secondary | ICD-10-CM | POA: Diagnosis not present

## 2018-11-20 DIAGNOSIS — R278 Other lack of coordination: Secondary | ICD-10-CM | POA: Diagnosis not present

## 2018-11-20 DIAGNOSIS — F332 Major depressive disorder, recurrent severe without psychotic features: Secondary | ICD-10-CM | POA: Diagnosis not present

## 2018-11-20 DIAGNOSIS — F0390 Unspecified dementia without behavioral disturbance: Secondary | ICD-10-CM | POA: Diagnosis not present

## 2018-11-20 DIAGNOSIS — R4701 Aphasia: Secondary | ICD-10-CM | POA: Diagnosis not present

## 2018-11-21 DIAGNOSIS — R278 Other lack of coordination: Secondary | ICD-10-CM | POA: Diagnosis not present

## 2018-11-21 DIAGNOSIS — F332 Major depressive disorder, recurrent severe without psychotic features: Secondary | ICD-10-CM | POA: Diagnosis not present

## 2018-11-21 DIAGNOSIS — R4701 Aphasia: Secondary | ICD-10-CM | POA: Diagnosis not present

## 2018-11-21 DIAGNOSIS — R2681 Unsteadiness on feet: Secondary | ICD-10-CM | POA: Diagnosis not present

## 2018-11-21 DIAGNOSIS — Z7901 Long term (current) use of anticoagulants: Secondary | ICD-10-CM | POA: Diagnosis not present

## 2018-11-21 DIAGNOSIS — F0390 Unspecified dementia without behavioral disturbance: Secondary | ICD-10-CM | POA: Diagnosis not present

## 2018-11-21 DIAGNOSIS — M6281 Muscle weakness (generalized): Secondary | ICD-10-CM | POA: Diagnosis not present

## 2018-11-21 DIAGNOSIS — G9340 Encephalopathy, unspecified: Secondary | ICD-10-CM | POA: Diagnosis not present

## 2018-11-21 DIAGNOSIS — F419 Anxiety disorder, unspecified: Secondary | ICD-10-CM | POA: Diagnosis not present

## 2018-11-22 DIAGNOSIS — R2681 Unsteadiness on feet: Secondary | ICD-10-CM | POA: Diagnosis not present

## 2018-11-22 DIAGNOSIS — Z7901 Long term (current) use of anticoagulants: Secondary | ICD-10-CM | POA: Diagnosis not present

## 2018-11-22 DIAGNOSIS — M6281 Muscle weakness (generalized): Secondary | ICD-10-CM | POA: Diagnosis not present

## 2018-11-22 DIAGNOSIS — R4701 Aphasia: Secondary | ICD-10-CM | POA: Diagnosis not present

## 2018-11-22 DIAGNOSIS — Z20828 Contact with and (suspected) exposure to other viral communicable diseases: Secondary | ICD-10-CM | POA: Diagnosis not present

## 2018-11-22 DIAGNOSIS — R278 Other lack of coordination: Secondary | ICD-10-CM | POA: Diagnosis not present

## 2018-11-22 DIAGNOSIS — F332 Major depressive disorder, recurrent severe without psychotic features: Secondary | ICD-10-CM | POA: Diagnosis not present

## 2018-11-22 DIAGNOSIS — F419 Anxiety disorder, unspecified: Secondary | ICD-10-CM | POA: Diagnosis not present

## 2018-11-22 DIAGNOSIS — G9340 Encephalopathy, unspecified: Secondary | ICD-10-CM | POA: Diagnosis not present

## 2018-11-22 DIAGNOSIS — F0390 Unspecified dementia without behavioral disturbance: Secondary | ICD-10-CM | POA: Diagnosis not present

## 2018-11-23 DIAGNOSIS — R278 Other lack of coordination: Secondary | ICD-10-CM | POA: Diagnosis not present

## 2018-11-23 DIAGNOSIS — F419 Anxiety disorder, unspecified: Secondary | ICD-10-CM | POA: Diagnosis not present

## 2018-11-23 DIAGNOSIS — R2681 Unsteadiness on feet: Secondary | ICD-10-CM | POA: Diagnosis not present

## 2018-11-23 DIAGNOSIS — F0390 Unspecified dementia without behavioral disturbance: Secondary | ICD-10-CM | POA: Diagnosis not present

## 2018-11-23 DIAGNOSIS — F332 Major depressive disorder, recurrent severe without psychotic features: Secondary | ICD-10-CM | POA: Diagnosis not present

## 2018-11-23 DIAGNOSIS — R4701 Aphasia: Secondary | ICD-10-CM | POA: Diagnosis not present

## 2018-11-23 DIAGNOSIS — M6281 Muscle weakness (generalized): Secondary | ICD-10-CM | POA: Diagnosis not present

## 2018-11-23 DIAGNOSIS — Z7901 Long term (current) use of anticoagulants: Secondary | ICD-10-CM | POA: Diagnosis not present

## 2018-11-23 DIAGNOSIS — G9340 Encephalopathy, unspecified: Secondary | ICD-10-CM | POA: Diagnosis not present

## 2018-11-24 DIAGNOSIS — M6281 Muscle weakness (generalized): Secondary | ICD-10-CM | POA: Diagnosis not present

## 2018-11-24 DIAGNOSIS — R278 Other lack of coordination: Secondary | ICD-10-CM | POA: Diagnosis not present

## 2018-11-24 DIAGNOSIS — F419 Anxiety disorder, unspecified: Secondary | ICD-10-CM | POA: Diagnosis not present

## 2018-11-24 DIAGNOSIS — R4701 Aphasia: Secondary | ICD-10-CM | POA: Diagnosis not present

## 2018-11-24 DIAGNOSIS — F0390 Unspecified dementia without behavioral disturbance: Secondary | ICD-10-CM | POA: Diagnosis not present

## 2018-11-24 DIAGNOSIS — G9340 Encephalopathy, unspecified: Secondary | ICD-10-CM | POA: Diagnosis not present

## 2018-11-24 DIAGNOSIS — F332 Major depressive disorder, recurrent severe without psychotic features: Secondary | ICD-10-CM | POA: Diagnosis not present

## 2018-11-24 DIAGNOSIS — R2681 Unsteadiness on feet: Secondary | ICD-10-CM | POA: Diagnosis not present

## 2018-11-24 DIAGNOSIS — Z7901 Long term (current) use of anticoagulants: Secondary | ICD-10-CM | POA: Diagnosis not present

## 2018-11-27 DIAGNOSIS — M6281 Muscle weakness (generalized): Secondary | ICD-10-CM | POA: Diagnosis not present

## 2018-11-27 DIAGNOSIS — G9340 Encephalopathy, unspecified: Secondary | ICD-10-CM | POA: Diagnosis not present

## 2018-11-27 DIAGNOSIS — R278 Other lack of coordination: Secondary | ICD-10-CM | POA: Diagnosis not present

## 2018-11-27 DIAGNOSIS — R2681 Unsteadiness on feet: Secondary | ICD-10-CM | POA: Diagnosis not present

## 2018-11-27 DIAGNOSIS — R4701 Aphasia: Secondary | ICD-10-CM | POA: Diagnosis not present

## 2018-11-27 DIAGNOSIS — F419 Anxiety disorder, unspecified: Secondary | ICD-10-CM | POA: Diagnosis not present

## 2018-11-27 DIAGNOSIS — Z7901 Long term (current) use of anticoagulants: Secondary | ICD-10-CM | POA: Diagnosis not present

## 2018-11-27 DIAGNOSIS — F332 Major depressive disorder, recurrent severe without psychotic features: Secondary | ICD-10-CM | POA: Diagnosis not present

## 2018-11-27 DIAGNOSIS — F0390 Unspecified dementia without behavioral disturbance: Secondary | ICD-10-CM | POA: Diagnosis not present

## 2018-11-28 DIAGNOSIS — F419 Anxiety disorder, unspecified: Secondary | ICD-10-CM | POA: Diagnosis not present

## 2018-11-28 DIAGNOSIS — G9340 Encephalopathy, unspecified: Secondary | ICD-10-CM | POA: Diagnosis not present

## 2018-11-28 DIAGNOSIS — Z7901 Long term (current) use of anticoagulants: Secondary | ICD-10-CM | POA: Diagnosis not present

## 2018-11-28 DIAGNOSIS — R278 Other lack of coordination: Secondary | ICD-10-CM | POA: Diagnosis not present

## 2018-11-28 DIAGNOSIS — M6281 Muscle weakness (generalized): Secondary | ICD-10-CM | POA: Diagnosis not present

## 2018-11-28 DIAGNOSIS — F332 Major depressive disorder, recurrent severe without psychotic features: Secondary | ICD-10-CM | POA: Diagnosis not present

## 2018-11-28 DIAGNOSIS — R2681 Unsteadiness on feet: Secondary | ICD-10-CM | POA: Diagnosis not present

## 2018-11-28 DIAGNOSIS — F0390 Unspecified dementia without behavioral disturbance: Secondary | ICD-10-CM | POA: Diagnosis not present

## 2018-11-28 DIAGNOSIS — R4701 Aphasia: Secondary | ICD-10-CM | POA: Diagnosis not present

## 2018-11-29 DIAGNOSIS — G9340 Encephalopathy, unspecified: Secondary | ICD-10-CM | POA: Diagnosis not present

## 2018-11-29 DIAGNOSIS — R4701 Aphasia: Secondary | ICD-10-CM | POA: Diagnosis not present

## 2018-11-29 DIAGNOSIS — F0390 Unspecified dementia without behavioral disturbance: Secondary | ICD-10-CM | POA: Diagnosis not present

## 2018-11-29 DIAGNOSIS — R278 Other lack of coordination: Secondary | ICD-10-CM | POA: Diagnosis not present

## 2018-11-29 DIAGNOSIS — M6281 Muscle weakness (generalized): Secondary | ICD-10-CM | POA: Diagnosis not present

## 2018-11-29 DIAGNOSIS — Z7901 Long term (current) use of anticoagulants: Secondary | ICD-10-CM | POA: Diagnosis not present

## 2018-11-29 DIAGNOSIS — R2681 Unsteadiness on feet: Secondary | ICD-10-CM | POA: Diagnosis not present

## 2018-11-29 DIAGNOSIS — F419 Anxiety disorder, unspecified: Secondary | ICD-10-CM | POA: Diagnosis not present

## 2018-11-29 DIAGNOSIS — F332 Major depressive disorder, recurrent severe without psychotic features: Secondary | ICD-10-CM | POA: Diagnosis not present

## 2018-11-30 DIAGNOSIS — F0390 Unspecified dementia without behavioral disturbance: Secondary | ICD-10-CM | POA: Diagnosis not present

## 2018-11-30 DIAGNOSIS — R278 Other lack of coordination: Secondary | ICD-10-CM | POA: Diagnosis not present

## 2018-11-30 DIAGNOSIS — G9340 Encephalopathy, unspecified: Secondary | ICD-10-CM | POA: Diagnosis not present

## 2018-11-30 DIAGNOSIS — M6281 Muscle weakness (generalized): Secondary | ICD-10-CM | POA: Diagnosis not present

## 2018-11-30 DIAGNOSIS — F332 Major depressive disorder, recurrent severe without psychotic features: Secondary | ICD-10-CM | POA: Diagnosis not present

## 2018-11-30 DIAGNOSIS — F419 Anxiety disorder, unspecified: Secondary | ICD-10-CM | POA: Diagnosis not present

## 2018-11-30 DIAGNOSIS — R2681 Unsteadiness on feet: Secondary | ICD-10-CM | POA: Diagnosis not present

## 2018-11-30 DIAGNOSIS — R4701 Aphasia: Secondary | ICD-10-CM | POA: Diagnosis not present

## 2018-11-30 DIAGNOSIS — Z7901 Long term (current) use of anticoagulants: Secondary | ICD-10-CM | POA: Diagnosis not present

## 2018-12-01 DIAGNOSIS — I4891 Unspecified atrial fibrillation: Secondary | ICD-10-CM | POA: Diagnosis not present

## 2018-12-01 DIAGNOSIS — R2681 Unsteadiness on feet: Secondary | ICD-10-CM | POA: Diagnosis not present

## 2018-12-01 DIAGNOSIS — I10 Essential (primary) hypertension: Secondary | ICD-10-CM | POA: Diagnosis not present

## 2018-12-01 DIAGNOSIS — M6281 Muscle weakness (generalized): Secondary | ICD-10-CM | POA: Diagnosis not present

## 2018-12-01 DIAGNOSIS — R4701 Aphasia: Secondary | ICD-10-CM | POA: Diagnosis not present

## 2018-12-01 DIAGNOSIS — F419 Anxiety disorder, unspecified: Secondary | ICD-10-CM | POA: Diagnosis not present

## 2018-12-01 DIAGNOSIS — F329 Major depressive disorder, single episode, unspecified: Secondary | ICD-10-CM | POA: Diagnosis not present

## 2018-12-01 DIAGNOSIS — F332 Major depressive disorder, recurrent severe without psychotic features: Secondary | ICD-10-CM | POA: Diagnosis not present

## 2018-12-01 DIAGNOSIS — G9340 Encephalopathy, unspecified: Secondary | ICD-10-CM | POA: Diagnosis not present

## 2018-12-01 DIAGNOSIS — G301 Alzheimer's disease with late onset: Secondary | ICD-10-CM | POA: Diagnosis not present

## 2018-12-01 DIAGNOSIS — R278 Other lack of coordination: Secondary | ICD-10-CM | POA: Diagnosis not present

## 2018-12-01 DIAGNOSIS — F0390 Unspecified dementia without behavioral disturbance: Secondary | ICD-10-CM | POA: Diagnosis not present

## 2018-12-01 DIAGNOSIS — Z7901 Long term (current) use of anticoagulants: Secondary | ICD-10-CM | POA: Diagnosis not present

## 2018-12-02 DIAGNOSIS — I10 Essential (primary) hypertension: Secondary | ICD-10-CM | POA: Diagnosis not present

## 2018-12-02 DIAGNOSIS — F332 Major depressive disorder, recurrent severe without psychotic features: Secondary | ICD-10-CM | POA: Diagnosis not present

## 2018-12-02 DIAGNOSIS — R4701 Aphasia: Secondary | ICD-10-CM | POA: Diagnosis not present

## 2018-12-02 DIAGNOSIS — Z7901 Long term (current) use of anticoagulants: Secondary | ICD-10-CM | POA: Diagnosis not present

## 2018-12-02 DIAGNOSIS — F419 Anxiety disorder, unspecified: Secondary | ICD-10-CM | POA: Diagnosis not present

## 2018-12-02 DIAGNOSIS — F0390 Unspecified dementia without behavioral disturbance: Secondary | ICD-10-CM | POA: Diagnosis not present

## 2018-12-02 DIAGNOSIS — R2681 Unsteadiness on feet: Secondary | ICD-10-CM | POA: Diagnosis not present

## 2018-12-02 DIAGNOSIS — R278 Other lack of coordination: Secondary | ICD-10-CM | POA: Diagnosis not present

## 2018-12-02 DIAGNOSIS — M6281 Muscle weakness (generalized): Secondary | ICD-10-CM | POA: Diagnosis not present

## 2018-12-04 DIAGNOSIS — F419 Anxiety disorder, unspecified: Secondary | ICD-10-CM | POA: Diagnosis not present

## 2018-12-04 DIAGNOSIS — F332 Major depressive disorder, recurrent severe without psychotic features: Secondary | ICD-10-CM | POA: Diagnosis not present

## 2018-12-04 DIAGNOSIS — R2681 Unsteadiness on feet: Secondary | ICD-10-CM | POA: Diagnosis not present

## 2018-12-04 DIAGNOSIS — I10 Essential (primary) hypertension: Secondary | ICD-10-CM | POA: Diagnosis not present

## 2018-12-04 DIAGNOSIS — Z7901 Long term (current) use of anticoagulants: Secondary | ICD-10-CM | POA: Diagnosis not present

## 2018-12-04 DIAGNOSIS — M6281 Muscle weakness (generalized): Secondary | ICD-10-CM | POA: Diagnosis not present

## 2018-12-04 DIAGNOSIS — R278 Other lack of coordination: Secondary | ICD-10-CM | POA: Diagnosis not present

## 2018-12-04 DIAGNOSIS — R4701 Aphasia: Secondary | ICD-10-CM | POA: Diagnosis not present

## 2018-12-04 DIAGNOSIS — F0390 Unspecified dementia without behavioral disturbance: Secondary | ICD-10-CM | POA: Diagnosis not present

## 2018-12-14 DIAGNOSIS — I10 Essential (primary) hypertension: Secondary | ICD-10-CM | POA: Diagnosis not present

## 2018-12-14 DIAGNOSIS — E785 Hyperlipidemia, unspecified: Secondary | ICD-10-CM | POA: Diagnosis not present

## 2018-12-14 DIAGNOSIS — D649 Anemia, unspecified: Secondary | ICD-10-CM | POA: Diagnosis not present

## 2018-12-14 DIAGNOSIS — E782 Mixed hyperlipidemia: Secondary | ICD-10-CM | POA: Diagnosis not present

## 2018-12-14 DIAGNOSIS — E039 Hypothyroidism, unspecified: Secondary | ICD-10-CM | POA: Diagnosis not present

## 2018-12-14 DIAGNOSIS — Z79899 Other long term (current) drug therapy: Secondary | ICD-10-CM | POA: Diagnosis not present

## 2018-12-14 DIAGNOSIS — E559 Vitamin D deficiency, unspecified: Secondary | ICD-10-CM | POA: Diagnosis not present

## 2018-12-22 DIAGNOSIS — Q845 Enlarged and hypertrophic nails: Secondary | ICD-10-CM | POA: Diagnosis not present

## 2018-12-22 DIAGNOSIS — I739 Peripheral vascular disease, unspecified: Secondary | ICD-10-CM | POA: Diagnosis not present

## 2018-12-22 DIAGNOSIS — B351 Tinea unguium: Secondary | ICD-10-CM | POA: Diagnosis not present

## 2018-12-28 DIAGNOSIS — F419 Anxiety disorder, unspecified: Secondary | ICD-10-CM | POA: Diagnosis not present

## 2018-12-28 DIAGNOSIS — F329 Major depressive disorder, single episode, unspecified: Secondary | ICD-10-CM | POA: Diagnosis not present

## 2018-12-28 DIAGNOSIS — I4891 Unspecified atrial fibrillation: Secondary | ICD-10-CM | POA: Diagnosis not present

## 2018-12-28 DIAGNOSIS — G301 Alzheimer's disease with late onset: Secondary | ICD-10-CM | POA: Diagnosis not present

## 2018-12-29 DIAGNOSIS — F333 Major depressive disorder, recurrent, severe with psychotic symptoms: Secondary | ICD-10-CM | POA: Diagnosis not present

## 2018-12-29 DIAGNOSIS — G301 Alzheimer's disease with late onset: Secondary | ICD-10-CM | POA: Diagnosis not present

## 2018-12-29 DIAGNOSIS — F0281 Dementia in other diseases classified elsewhere with behavioral disturbance: Secondary | ICD-10-CM | POA: Diagnosis not present

## 2019-01-03 DIAGNOSIS — E785 Hyperlipidemia, unspecified: Secondary | ICD-10-CM | POA: Diagnosis not present

## 2019-01-03 DIAGNOSIS — F329 Major depressive disorder, single episode, unspecified: Secondary | ICD-10-CM | POA: Diagnosis not present

## 2019-01-03 DIAGNOSIS — E1122 Type 2 diabetes mellitus with diabetic chronic kidney disease: Secondary | ICD-10-CM | POA: Diagnosis not present

## 2019-01-03 DIAGNOSIS — G8929 Other chronic pain: Secondary | ICD-10-CM | POA: Diagnosis not present

## 2019-01-03 DIAGNOSIS — E039 Hypothyroidism, unspecified: Secondary | ICD-10-CM | POA: Diagnosis not present

## 2019-01-03 DIAGNOSIS — I2510 Atherosclerotic heart disease of native coronary artery without angina pectoris: Secondary | ICD-10-CM | POA: Diagnosis not present

## 2019-01-03 DIAGNOSIS — K439 Ventral hernia without obstruction or gangrene: Secondary | ICD-10-CM | POA: Diagnosis not present

## 2019-01-03 DIAGNOSIS — I4891 Unspecified atrial fibrillation: Secondary | ICD-10-CM | POA: Diagnosis not present

## 2019-01-09 DIAGNOSIS — Z79899 Other long term (current) drug therapy: Secondary | ICD-10-CM | POA: Diagnosis not present

## 2019-01-09 DIAGNOSIS — R569 Unspecified convulsions: Secondary | ICD-10-CM | POA: Diagnosis not present

## 2019-01-30 DIAGNOSIS — F333 Major depressive disorder, recurrent, severe with psychotic symptoms: Secondary | ICD-10-CM | POA: Diagnosis not present

## 2019-01-30 DIAGNOSIS — F0281 Dementia in other diseases classified elsewhere with behavioral disturbance: Secondary | ICD-10-CM | POA: Diagnosis not present

## 2019-01-30 DIAGNOSIS — G301 Alzheimer's disease with late onset: Secondary | ICD-10-CM | POA: Diagnosis not present

## 2019-02-01 DIAGNOSIS — Z20828 Contact with and (suspected) exposure to other viral communicable diseases: Secondary | ICD-10-CM | POA: Diagnosis not present

## 2019-02-07 DIAGNOSIS — Z20828 Contact with and (suspected) exposure to other viral communicable diseases: Secondary | ICD-10-CM | POA: Diagnosis not present

## 2019-02-13 DIAGNOSIS — F333 Major depressive disorder, recurrent, severe with psychotic symptoms: Secondary | ICD-10-CM | POA: Diagnosis not present

## 2019-02-13 DIAGNOSIS — F0281 Dementia in other diseases classified elsewhere with behavioral disturbance: Secondary | ICD-10-CM | POA: Diagnosis not present

## 2019-02-13 DIAGNOSIS — G301 Alzheimer's disease with late onset: Secondary | ICD-10-CM | POA: Diagnosis not present

## 2019-02-20 DIAGNOSIS — Z20828 Contact with and (suspected) exposure to other viral communicable diseases: Secondary | ICD-10-CM | POA: Diagnosis not present

## 2019-02-27 DIAGNOSIS — G301 Alzheimer's disease with late onset: Secondary | ICD-10-CM | POA: Diagnosis not present

## 2019-02-27 DIAGNOSIS — F332 Major depressive disorder, recurrent severe without psychotic features: Secondary | ICD-10-CM | POA: Diagnosis not present

## 2019-02-27 DIAGNOSIS — F333 Major depressive disorder, recurrent, severe with psychotic symptoms: Secondary | ICD-10-CM | POA: Diagnosis not present

## 2019-02-27 DIAGNOSIS — Z20828 Contact with and (suspected) exposure to other viral communicable diseases: Secondary | ICD-10-CM | POA: Diagnosis not present

## 2019-02-27 DIAGNOSIS — F0281 Dementia in other diseases classified elsewhere with behavioral disturbance: Secondary | ICD-10-CM | POA: Diagnosis not present

## 2019-02-28 DIAGNOSIS — I10 Essential (primary) hypertension: Secondary | ICD-10-CM | POA: Diagnosis not present

## 2019-02-28 DIAGNOSIS — F329 Major depressive disorder, single episode, unspecified: Secondary | ICD-10-CM | POA: Diagnosis not present

## 2019-02-28 DIAGNOSIS — M810 Age-related osteoporosis without current pathological fracture: Secondary | ICD-10-CM | POA: Diagnosis not present

## 2019-02-28 DIAGNOSIS — F0391 Unspecified dementia with behavioral disturbance: Secondary | ICD-10-CM | POA: Diagnosis not present

## 2019-02-28 DIAGNOSIS — E785 Hyperlipidemia, unspecified: Secondary | ICD-10-CM | POA: Diagnosis not present

## 2019-02-28 DIAGNOSIS — I4891 Unspecified atrial fibrillation: Secondary | ICD-10-CM | POA: Diagnosis not present

## 2019-03-06 DIAGNOSIS — Z20828 Contact with and (suspected) exposure to other viral communicable diseases: Secondary | ICD-10-CM | POA: Diagnosis not present

## 2019-03-12 DIAGNOSIS — G301 Alzheimer's disease with late onset: Secondary | ICD-10-CM | POA: Diagnosis not present

## 2019-03-12 DIAGNOSIS — F329 Major depressive disorder, single episode, unspecified: Secondary | ICD-10-CM | POA: Diagnosis not present

## 2019-03-12 DIAGNOSIS — I10 Essential (primary) hypertension: Secondary | ICD-10-CM | POA: Diagnosis not present

## 2019-03-12 DIAGNOSIS — I4891 Unspecified atrial fibrillation: Secondary | ICD-10-CM | POA: Diagnosis not present

## 2019-03-13 DIAGNOSIS — Z20828 Contact with and (suspected) exposure to other viral communicable diseases: Secondary | ICD-10-CM | POA: Diagnosis not present

## 2019-03-19 DIAGNOSIS — M6281 Muscle weakness (generalized): Secondary | ICD-10-CM | POA: Diagnosis not present

## 2019-03-19 DIAGNOSIS — R4701 Aphasia: Secondary | ICD-10-CM | POA: Diagnosis not present

## 2019-03-19 DIAGNOSIS — G9340 Encephalopathy, unspecified: Secondary | ICD-10-CM | POA: Diagnosis not present

## 2019-03-19 DIAGNOSIS — F332 Major depressive disorder, recurrent severe without psychotic features: Secondary | ICD-10-CM | POA: Diagnosis not present

## 2019-03-19 DIAGNOSIS — R1311 Dysphagia, oral phase: Secondary | ICD-10-CM | POA: Diagnosis not present

## 2019-03-19 DIAGNOSIS — Z7901 Long term (current) use of anticoagulants: Secondary | ICD-10-CM | POA: Diagnosis not present

## 2019-03-19 DIAGNOSIS — R278 Other lack of coordination: Secondary | ICD-10-CM | POA: Diagnosis not present

## 2019-03-19 DIAGNOSIS — F419 Anxiety disorder, unspecified: Secondary | ICD-10-CM | POA: Diagnosis not present

## 2019-03-19 DIAGNOSIS — F0390 Unspecified dementia without behavioral disturbance: Secondary | ICD-10-CM | POA: Diagnosis not present

## 2019-03-20 DIAGNOSIS — F0390 Unspecified dementia without behavioral disturbance: Secondary | ICD-10-CM | POA: Diagnosis not present

## 2019-03-20 DIAGNOSIS — F332 Major depressive disorder, recurrent severe without psychotic features: Secondary | ICD-10-CM | POA: Diagnosis not present

## 2019-03-20 DIAGNOSIS — R4701 Aphasia: Secondary | ICD-10-CM | POA: Diagnosis not present

## 2019-03-20 DIAGNOSIS — R278 Other lack of coordination: Secondary | ICD-10-CM | POA: Diagnosis not present

## 2019-03-20 DIAGNOSIS — G9340 Encephalopathy, unspecified: Secondary | ICD-10-CM | POA: Diagnosis not present

## 2019-03-20 DIAGNOSIS — M6281 Muscle weakness (generalized): Secondary | ICD-10-CM | POA: Diagnosis not present

## 2019-03-20 DIAGNOSIS — Z20828 Contact with and (suspected) exposure to other viral communicable diseases: Secondary | ICD-10-CM | POA: Diagnosis not present

## 2019-03-20 DIAGNOSIS — R1311 Dysphagia, oral phase: Secondary | ICD-10-CM | POA: Diagnosis not present

## 2019-03-20 DIAGNOSIS — Z7901 Long term (current) use of anticoagulants: Secondary | ICD-10-CM | POA: Diagnosis not present

## 2019-03-20 DIAGNOSIS — F419 Anxiety disorder, unspecified: Secondary | ICD-10-CM | POA: Diagnosis not present

## 2019-03-21 DIAGNOSIS — M6281 Muscle weakness (generalized): Secondary | ICD-10-CM | POA: Diagnosis not present

## 2019-03-21 DIAGNOSIS — R1311 Dysphagia, oral phase: Secondary | ICD-10-CM | POA: Diagnosis not present

## 2019-03-21 DIAGNOSIS — F0390 Unspecified dementia without behavioral disturbance: Secondary | ICD-10-CM | POA: Diagnosis not present

## 2019-03-21 DIAGNOSIS — F419 Anxiety disorder, unspecified: Secondary | ICD-10-CM | POA: Diagnosis not present

## 2019-03-21 DIAGNOSIS — R278 Other lack of coordination: Secondary | ICD-10-CM | POA: Diagnosis not present

## 2019-03-21 DIAGNOSIS — R4701 Aphasia: Secondary | ICD-10-CM | POA: Diagnosis not present

## 2019-03-21 DIAGNOSIS — G9340 Encephalopathy, unspecified: Secondary | ICD-10-CM | POA: Diagnosis not present

## 2019-03-21 DIAGNOSIS — Z7901 Long term (current) use of anticoagulants: Secondary | ICD-10-CM | POA: Diagnosis not present

## 2019-03-21 DIAGNOSIS — F332 Major depressive disorder, recurrent severe without psychotic features: Secondary | ICD-10-CM | POA: Diagnosis not present

## 2019-03-22 DIAGNOSIS — R1311 Dysphagia, oral phase: Secondary | ICD-10-CM | POA: Diagnosis not present

## 2019-03-22 DIAGNOSIS — F332 Major depressive disorder, recurrent severe without psychotic features: Secondary | ICD-10-CM | POA: Diagnosis not present

## 2019-03-22 DIAGNOSIS — Z7901 Long term (current) use of anticoagulants: Secondary | ICD-10-CM | POA: Diagnosis not present

## 2019-03-22 DIAGNOSIS — G9340 Encephalopathy, unspecified: Secondary | ICD-10-CM | POA: Diagnosis not present

## 2019-03-22 DIAGNOSIS — F419 Anxiety disorder, unspecified: Secondary | ICD-10-CM | POA: Diagnosis not present

## 2019-03-22 DIAGNOSIS — F0390 Unspecified dementia without behavioral disturbance: Secondary | ICD-10-CM | POA: Diagnosis not present

## 2019-03-22 DIAGNOSIS — M6281 Muscle weakness (generalized): Secondary | ICD-10-CM | POA: Diagnosis not present

## 2019-03-22 DIAGNOSIS — R4701 Aphasia: Secondary | ICD-10-CM | POA: Diagnosis not present

## 2019-03-22 DIAGNOSIS — R278 Other lack of coordination: Secondary | ICD-10-CM | POA: Diagnosis not present

## 2019-03-24 DIAGNOSIS — Z7901 Long term (current) use of anticoagulants: Secondary | ICD-10-CM | POA: Diagnosis not present

## 2019-03-24 DIAGNOSIS — G9340 Encephalopathy, unspecified: Secondary | ICD-10-CM | POA: Diagnosis not present

## 2019-03-24 DIAGNOSIS — R278 Other lack of coordination: Secondary | ICD-10-CM | POA: Diagnosis not present

## 2019-03-24 DIAGNOSIS — R1311 Dysphagia, oral phase: Secondary | ICD-10-CM | POA: Diagnosis not present

## 2019-03-24 DIAGNOSIS — F332 Major depressive disorder, recurrent severe without psychotic features: Secondary | ICD-10-CM | POA: Diagnosis not present

## 2019-03-24 DIAGNOSIS — F0390 Unspecified dementia without behavioral disturbance: Secondary | ICD-10-CM | POA: Diagnosis not present

## 2019-03-24 DIAGNOSIS — F419 Anxiety disorder, unspecified: Secondary | ICD-10-CM | POA: Diagnosis not present

## 2019-03-24 DIAGNOSIS — R4701 Aphasia: Secondary | ICD-10-CM | POA: Diagnosis not present

## 2019-03-24 DIAGNOSIS — M6281 Muscle weakness (generalized): Secondary | ICD-10-CM | POA: Diagnosis not present

## 2019-03-25 DIAGNOSIS — F0390 Unspecified dementia without behavioral disturbance: Secondary | ICD-10-CM | POA: Diagnosis not present

## 2019-03-25 DIAGNOSIS — R4701 Aphasia: Secondary | ICD-10-CM | POA: Diagnosis not present

## 2019-03-25 DIAGNOSIS — Z7901 Long term (current) use of anticoagulants: Secondary | ICD-10-CM | POA: Diagnosis not present

## 2019-03-25 DIAGNOSIS — R278 Other lack of coordination: Secondary | ICD-10-CM | POA: Diagnosis not present

## 2019-03-25 DIAGNOSIS — F332 Major depressive disorder, recurrent severe without psychotic features: Secondary | ICD-10-CM | POA: Diagnosis not present

## 2019-03-25 DIAGNOSIS — G9340 Encephalopathy, unspecified: Secondary | ICD-10-CM | POA: Diagnosis not present

## 2019-03-25 DIAGNOSIS — F419 Anxiety disorder, unspecified: Secondary | ICD-10-CM | POA: Diagnosis not present

## 2019-03-25 DIAGNOSIS — R1311 Dysphagia, oral phase: Secondary | ICD-10-CM | POA: Diagnosis not present

## 2019-03-25 DIAGNOSIS — M6281 Muscle weakness (generalized): Secondary | ICD-10-CM | POA: Diagnosis not present

## 2019-03-26 DIAGNOSIS — R278 Other lack of coordination: Secondary | ICD-10-CM | POA: Diagnosis not present

## 2019-03-26 DIAGNOSIS — R4701 Aphasia: Secondary | ICD-10-CM | POA: Diagnosis not present

## 2019-03-26 DIAGNOSIS — M6281 Muscle weakness (generalized): Secondary | ICD-10-CM | POA: Diagnosis not present

## 2019-03-26 DIAGNOSIS — G9340 Encephalopathy, unspecified: Secondary | ICD-10-CM | POA: Diagnosis not present

## 2019-03-26 DIAGNOSIS — F332 Major depressive disorder, recurrent severe without psychotic features: Secondary | ICD-10-CM | POA: Diagnosis not present

## 2019-03-26 DIAGNOSIS — R1311 Dysphagia, oral phase: Secondary | ICD-10-CM | POA: Diagnosis not present

## 2019-03-26 DIAGNOSIS — Z7901 Long term (current) use of anticoagulants: Secondary | ICD-10-CM | POA: Diagnosis not present

## 2019-03-26 DIAGNOSIS — F0390 Unspecified dementia without behavioral disturbance: Secondary | ICD-10-CM | POA: Diagnosis not present

## 2019-03-26 DIAGNOSIS — F419 Anxiety disorder, unspecified: Secondary | ICD-10-CM | POA: Diagnosis not present

## 2019-03-27 DIAGNOSIS — G9340 Encephalopathy, unspecified: Secondary | ICD-10-CM | POA: Diagnosis not present

## 2019-03-27 DIAGNOSIS — R278 Other lack of coordination: Secondary | ICD-10-CM | POA: Diagnosis not present

## 2019-03-27 DIAGNOSIS — F0390 Unspecified dementia without behavioral disturbance: Secondary | ICD-10-CM | POA: Diagnosis not present

## 2019-03-27 DIAGNOSIS — F332 Major depressive disorder, recurrent severe without psychotic features: Secondary | ICD-10-CM | POA: Diagnosis not present

## 2019-03-27 DIAGNOSIS — Z20828 Contact with and (suspected) exposure to other viral communicable diseases: Secondary | ICD-10-CM | POA: Diagnosis not present

## 2019-03-27 DIAGNOSIS — F0281 Dementia in other diseases classified elsewhere with behavioral disturbance: Secondary | ICD-10-CM | POA: Diagnosis not present

## 2019-03-27 DIAGNOSIS — R1311 Dysphagia, oral phase: Secondary | ICD-10-CM | POA: Diagnosis not present

## 2019-03-27 DIAGNOSIS — F419 Anxiety disorder, unspecified: Secondary | ICD-10-CM | POA: Diagnosis not present

## 2019-03-27 DIAGNOSIS — R4701 Aphasia: Secondary | ICD-10-CM | POA: Diagnosis not present

## 2019-03-27 DIAGNOSIS — Z7901 Long term (current) use of anticoagulants: Secondary | ICD-10-CM | POA: Diagnosis not present

## 2019-03-27 DIAGNOSIS — M6281 Muscle weakness (generalized): Secondary | ICD-10-CM | POA: Diagnosis not present

## 2019-03-27 DIAGNOSIS — G301 Alzheimer's disease with late onset: Secondary | ICD-10-CM | POA: Diagnosis not present

## 2019-03-28 DIAGNOSIS — M6281 Muscle weakness (generalized): Secondary | ICD-10-CM | POA: Diagnosis not present

## 2019-03-28 DIAGNOSIS — R4701 Aphasia: Secondary | ICD-10-CM | POA: Diagnosis not present

## 2019-03-28 DIAGNOSIS — F419 Anxiety disorder, unspecified: Secondary | ICD-10-CM | POA: Diagnosis not present

## 2019-03-28 DIAGNOSIS — R1311 Dysphagia, oral phase: Secondary | ICD-10-CM | POA: Diagnosis not present

## 2019-03-28 DIAGNOSIS — F332 Major depressive disorder, recurrent severe without psychotic features: Secondary | ICD-10-CM | POA: Diagnosis not present

## 2019-03-28 DIAGNOSIS — F333 Major depressive disorder, recurrent, severe with psychotic symptoms: Secondary | ICD-10-CM | POA: Diagnosis not present

## 2019-03-28 DIAGNOSIS — F0390 Unspecified dementia without behavioral disturbance: Secondary | ICD-10-CM | POA: Diagnosis not present

## 2019-03-28 DIAGNOSIS — G301 Alzheimer's disease with late onset: Secondary | ICD-10-CM | POA: Diagnosis not present

## 2019-03-28 DIAGNOSIS — G9340 Encephalopathy, unspecified: Secondary | ICD-10-CM | POA: Diagnosis not present

## 2019-03-28 DIAGNOSIS — R278 Other lack of coordination: Secondary | ICD-10-CM | POA: Diagnosis not present

## 2019-03-28 DIAGNOSIS — Z7901 Long term (current) use of anticoagulants: Secondary | ICD-10-CM | POA: Diagnosis not present

## 2019-03-30 DIAGNOSIS — F0390 Unspecified dementia without behavioral disturbance: Secondary | ICD-10-CM | POA: Diagnosis not present

## 2019-03-30 DIAGNOSIS — R4701 Aphasia: Secondary | ICD-10-CM | POA: Diagnosis not present

## 2019-03-30 DIAGNOSIS — F419 Anxiety disorder, unspecified: Secondary | ICD-10-CM | POA: Diagnosis not present

## 2019-03-30 DIAGNOSIS — R278 Other lack of coordination: Secondary | ICD-10-CM | POA: Diagnosis not present

## 2019-03-30 DIAGNOSIS — F332 Major depressive disorder, recurrent severe without psychotic features: Secondary | ICD-10-CM | POA: Diagnosis not present

## 2019-03-30 DIAGNOSIS — M6281 Muscle weakness (generalized): Secondary | ICD-10-CM | POA: Diagnosis not present

## 2019-03-30 DIAGNOSIS — G9340 Encephalopathy, unspecified: Secondary | ICD-10-CM | POA: Diagnosis not present

## 2019-03-30 DIAGNOSIS — Z7901 Long term (current) use of anticoagulants: Secondary | ICD-10-CM | POA: Diagnosis not present

## 2019-03-30 DIAGNOSIS — R1311 Dysphagia, oral phase: Secondary | ICD-10-CM | POA: Diagnosis not present

## 2019-04-02 DIAGNOSIS — G9340 Encephalopathy, unspecified: Secondary | ICD-10-CM | POA: Diagnosis not present

## 2019-04-02 DIAGNOSIS — R1311 Dysphagia, oral phase: Secondary | ICD-10-CM | POA: Diagnosis not present

## 2019-04-02 DIAGNOSIS — Z7901 Long term (current) use of anticoagulants: Secondary | ICD-10-CM | POA: Diagnosis not present

## 2019-04-02 DIAGNOSIS — F0390 Unspecified dementia without behavioral disturbance: Secondary | ICD-10-CM | POA: Diagnosis not present

## 2019-04-02 DIAGNOSIS — R278 Other lack of coordination: Secondary | ICD-10-CM | POA: Diagnosis not present

## 2019-04-02 DIAGNOSIS — F332 Major depressive disorder, recurrent severe without psychotic features: Secondary | ICD-10-CM | POA: Diagnosis not present

## 2019-04-02 DIAGNOSIS — M6281 Muscle weakness (generalized): Secondary | ICD-10-CM | POA: Diagnosis not present

## 2019-04-02 DIAGNOSIS — R4701 Aphasia: Secondary | ICD-10-CM | POA: Diagnosis not present

## 2019-04-02 DIAGNOSIS — F419 Anxiety disorder, unspecified: Secondary | ICD-10-CM | POA: Diagnosis not present

## 2019-04-03 DIAGNOSIS — R1311 Dysphagia, oral phase: Secondary | ICD-10-CM | POA: Diagnosis not present

## 2019-04-03 DIAGNOSIS — Z20828 Contact with and (suspected) exposure to other viral communicable diseases: Secondary | ICD-10-CM | POA: Diagnosis not present

## 2019-04-03 DIAGNOSIS — F0390 Unspecified dementia without behavioral disturbance: Secondary | ICD-10-CM | POA: Diagnosis not present

## 2019-04-03 DIAGNOSIS — M6281 Muscle weakness (generalized): Secondary | ICD-10-CM | POA: Diagnosis not present

## 2019-04-03 DIAGNOSIS — Z7901 Long term (current) use of anticoagulants: Secondary | ICD-10-CM | POA: Diagnosis not present

## 2019-04-03 DIAGNOSIS — F419 Anxiety disorder, unspecified: Secondary | ICD-10-CM | POA: Diagnosis not present

## 2019-04-03 DIAGNOSIS — F332 Major depressive disorder, recurrent severe without psychotic features: Secondary | ICD-10-CM | POA: Diagnosis not present

## 2019-04-03 DIAGNOSIS — R4701 Aphasia: Secondary | ICD-10-CM | POA: Diagnosis not present

## 2019-04-03 DIAGNOSIS — G9340 Encephalopathy, unspecified: Secondary | ICD-10-CM | POA: Diagnosis not present

## 2019-04-03 DIAGNOSIS — R278 Other lack of coordination: Secondary | ICD-10-CM | POA: Diagnosis not present

## 2019-04-04 DIAGNOSIS — F0390 Unspecified dementia without behavioral disturbance: Secondary | ICD-10-CM | POA: Diagnosis not present

## 2019-04-04 DIAGNOSIS — F332 Major depressive disorder, recurrent severe without psychotic features: Secondary | ICD-10-CM | POA: Diagnosis not present

## 2019-04-04 DIAGNOSIS — M6281 Muscle weakness (generalized): Secondary | ICD-10-CM | POA: Diagnosis not present

## 2019-04-04 DIAGNOSIS — G9340 Encephalopathy, unspecified: Secondary | ICD-10-CM | POA: Diagnosis not present

## 2019-04-04 DIAGNOSIS — R4701 Aphasia: Secondary | ICD-10-CM | POA: Diagnosis not present

## 2019-04-04 DIAGNOSIS — F419 Anxiety disorder, unspecified: Secondary | ICD-10-CM | POA: Diagnosis not present

## 2019-04-04 DIAGNOSIS — R1311 Dysphagia, oral phase: Secondary | ICD-10-CM | POA: Diagnosis not present

## 2019-04-04 DIAGNOSIS — R278 Other lack of coordination: Secondary | ICD-10-CM | POA: Diagnosis not present

## 2019-04-04 DIAGNOSIS — Z7901 Long term (current) use of anticoagulants: Secondary | ICD-10-CM | POA: Diagnosis not present

## 2019-04-05 DIAGNOSIS — F0390 Unspecified dementia without behavioral disturbance: Secondary | ICD-10-CM | POA: Diagnosis not present

## 2019-04-05 DIAGNOSIS — F419 Anxiety disorder, unspecified: Secondary | ICD-10-CM | POA: Diagnosis not present

## 2019-04-05 DIAGNOSIS — F332 Major depressive disorder, recurrent severe without psychotic features: Secondary | ICD-10-CM | POA: Diagnosis not present

## 2019-04-05 DIAGNOSIS — Z7901 Long term (current) use of anticoagulants: Secondary | ICD-10-CM | POA: Diagnosis not present

## 2019-04-05 DIAGNOSIS — R278 Other lack of coordination: Secondary | ICD-10-CM | POA: Diagnosis not present

## 2019-04-05 DIAGNOSIS — R1311 Dysphagia, oral phase: Secondary | ICD-10-CM | POA: Diagnosis not present

## 2019-04-05 DIAGNOSIS — R4701 Aphasia: Secondary | ICD-10-CM | POA: Diagnosis not present

## 2019-04-05 DIAGNOSIS — M6281 Muscle weakness (generalized): Secondary | ICD-10-CM | POA: Diagnosis not present

## 2019-04-05 DIAGNOSIS — G9340 Encephalopathy, unspecified: Secondary | ICD-10-CM | POA: Diagnosis not present

## 2019-04-09 DIAGNOSIS — F0390 Unspecified dementia without behavioral disturbance: Secondary | ICD-10-CM | POA: Diagnosis not present

## 2019-04-09 DIAGNOSIS — M6281 Muscle weakness (generalized): Secondary | ICD-10-CM | POA: Diagnosis not present

## 2019-04-09 DIAGNOSIS — R4701 Aphasia: Secondary | ICD-10-CM | POA: Diagnosis not present

## 2019-04-09 DIAGNOSIS — F419 Anxiety disorder, unspecified: Secondary | ICD-10-CM | POA: Diagnosis not present

## 2019-04-09 DIAGNOSIS — F332 Major depressive disorder, recurrent severe without psychotic features: Secondary | ICD-10-CM | POA: Diagnosis not present

## 2019-04-09 DIAGNOSIS — G9340 Encephalopathy, unspecified: Secondary | ICD-10-CM | POA: Diagnosis not present

## 2019-04-09 DIAGNOSIS — R278 Other lack of coordination: Secondary | ICD-10-CM | POA: Diagnosis not present

## 2019-04-09 DIAGNOSIS — Z7901 Long term (current) use of anticoagulants: Secondary | ICD-10-CM | POA: Diagnosis not present

## 2019-04-09 DIAGNOSIS — R1311 Dysphagia, oral phase: Secondary | ICD-10-CM | POA: Diagnosis not present

## 2019-04-10 DIAGNOSIS — F0390 Unspecified dementia without behavioral disturbance: Secondary | ICD-10-CM | POA: Diagnosis not present

## 2019-04-10 DIAGNOSIS — R4701 Aphasia: Secondary | ICD-10-CM | POA: Diagnosis not present

## 2019-04-10 DIAGNOSIS — F419 Anxiety disorder, unspecified: Secondary | ICD-10-CM | POA: Diagnosis not present

## 2019-04-10 DIAGNOSIS — R1311 Dysphagia, oral phase: Secondary | ICD-10-CM | POA: Diagnosis not present

## 2019-04-10 DIAGNOSIS — R278 Other lack of coordination: Secondary | ICD-10-CM | POA: Diagnosis not present

## 2019-04-10 DIAGNOSIS — F332 Major depressive disorder, recurrent severe without psychotic features: Secondary | ICD-10-CM | POA: Diagnosis not present

## 2019-04-10 DIAGNOSIS — Z7901 Long term (current) use of anticoagulants: Secondary | ICD-10-CM | POA: Diagnosis not present

## 2019-04-10 DIAGNOSIS — G9340 Encephalopathy, unspecified: Secondary | ICD-10-CM | POA: Diagnosis not present

## 2019-04-10 DIAGNOSIS — M6281 Muscle weakness (generalized): Secondary | ICD-10-CM | POA: Diagnosis not present

## 2019-04-11 DIAGNOSIS — R4701 Aphasia: Secondary | ICD-10-CM | POA: Diagnosis not present

## 2019-04-11 DIAGNOSIS — R278 Other lack of coordination: Secondary | ICD-10-CM | POA: Diagnosis not present

## 2019-04-11 DIAGNOSIS — R1311 Dysphagia, oral phase: Secondary | ICD-10-CM | POA: Diagnosis not present

## 2019-04-11 DIAGNOSIS — M6281 Muscle weakness (generalized): Secondary | ICD-10-CM | POA: Diagnosis not present

## 2019-04-11 DIAGNOSIS — F0390 Unspecified dementia without behavioral disturbance: Secondary | ICD-10-CM | POA: Diagnosis not present

## 2019-04-11 DIAGNOSIS — F332 Major depressive disorder, recurrent severe without psychotic features: Secondary | ICD-10-CM | POA: Diagnosis not present

## 2019-04-11 DIAGNOSIS — G9340 Encephalopathy, unspecified: Secondary | ICD-10-CM | POA: Diagnosis not present

## 2019-04-11 DIAGNOSIS — F419 Anxiety disorder, unspecified: Secondary | ICD-10-CM | POA: Diagnosis not present

## 2019-04-11 DIAGNOSIS — Z7901 Long term (current) use of anticoagulants: Secondary | ICD-10-CM | POA: Diagnosis not present

## 2019-04-12 DIAGNOSIS — R278 Other lack of coordination: Secondary | ICD-10-CM | POA: Diagnosis not present

## 2019-04-12 DIAGNOSIS — F332 Major depressive disorder, recurrent severe without psychotic features: Secondary | ICD-10-CM | POA: Diagnosis not present

## 2019-04-12 DIAGNOSIS — R4701 Aphasia: Secondary | ICD-10-CM | POA: Diagnosis not present

## 2019-04-12 DIAGNOSIS — F0390 Unspecified dementia without behavioral disturbance: Secondary | ICD-10-CM | POA: Diagnosis not present

## 2019-04-12 DIAGNOSIS — F419 Anxiety disorder, unspecified: Secondary | ICD-10-CM | POA: Diagnosis not present

## 2019-04-12 DIAGNOSIS — M6281 Muscle weakness (generalized): Secondary | ICD-10-CM | POA: Diagnosis not present

## 2019-04-12 DIAGNOSIS — G9340 Encephalopathy, unspecified: Secondary | ICD-10-CM | POA: Diagnosis not present

## 2019-04-12 DIAGNOSIS — Z7901 Long term (current) use of anticoagulants: Secondary | ICD-10-CM | POA: Diagnosis not present

## 2019-04-12 DIAGNOSIS — R1311 Dysphagia, oral phase: Secondary | ICD-10-CM | POA: Diagnosis not present

## 2019-04-13 DIAGNOSIS — G9340 Encephalopathy, unspecified: Secondary | ICD-10-CM | POA: Diagnosis not present

## 2019-04-13 DIAGNOSIS — Z7901 Long term (current) use of anticoagulants: Secondary | ICD-10-CM | POA: Diagnosis not present

## 2019-04-13 DIAGNOSIS — R1311 Dysphagia, oral phase: Secondary | ICD-10-CM | POA: Diagnosis not present

## 2019-04-13 DIAGNOSIS — F419 Anxiety disorder, unspecified: Secondary | ICD-10-CM | POA: Diagnosis not present

## 2019-04-13 DIAGNOSIS — F332 Major depressive disorder, recurrent severe without psychotic features: Secondary | ICD-10-CM | POA: Diagnosis not present

## 2019-04-13 DIAGNOSIS — M6281 Muscle weakness (generalized): Secondary | ICD-10-CM | POA: Diagnosis not present

## 2019-04-13 DIAGNOSIS — R4701 Aphasia: Secondary | ICD-10-CM | POA: Diagnosis not present

## 2019-04-13 DIAGNOSIS — R278 Other lack of coordination: Secondary | ICD-10-CM | POA: Diagnosis not present

## 2019-04-13 DIAGNOSIS — F0390 Unspecified dementia without behavioral disturbance: Secondary | ICD-10-CM | POA: Diagnosis not present

## 2019-04-16 DIAGNOSIS — F332 Major depressive disorder, recurrent severe without psychotic features: Secondary | ICD-10-CM | POA: Diagnosis not present

## 2019-04-16 DIAGNOSIS — Z7901 Long term (current) use of anticoagulants: Secondary | ICD-10-CM | POA: Diagnosis not present

## 2019-04-16 DIAGNOSIS — R4701 Aphasia: Secondary | ICD-10-CM | POA: Diagnosis not present

## 2019-04-16 DIAGNOSIS — R1311 Dysphagia, oral phase: Secondary | ICD-10-CM | POA: Diagnosis not present

## 2019-04-16 DIAGNOSIS — G9340 Encephalopathy, unspecified: Secondary | ICD-10-CM | POA: Diagnosis not present

## 2019-04-16 DIAGNOSIS — F0390 Unspecified dementia without behavioral disturbance: Secondary | ICD-10-CM | POA: Diagnosis not present

## 2019-04-16 DIAGNOSIS — F419 Anxiety disorder, unspecified: Secondary | ICD-10-CM | POA: Diagnosis not present

## 2019-04-16 DIAGNOSIS — M6281 Muscle weakness (generalized): Secondary | ICD-10-CM | POA: Diagnosis not present

## 2019-04-16 DIAGNOSIS — R278 Other lack of coordination: Secondary | ICD-10-CM | POA: Diagnosis not present

## 2019-04-17 DIAGNOSIS — Z7901 Long term (current) use of anticoagulants: Secondary | ICD-10-CM | POA: Diagnosis not present

## 2019-04-17 DIAGNOSIS — F332 Major depressive disorder, recurrent severe without psychotic features: Secondary | ICD-10-CM | POA: Diagnosis not present

## 2019-04-17 DIAGNOSIS — M6281 Muscle weakness (generalized): Secondary | ICD-10-CM | POA: Diagnosis not present

## 2019-04-17 DIAGNOSIS — I739 Peripheral vascular disease, unspecified: Secondary | ICD-10-CM | POA: Diagnosis not present

## 2019-04-17 DIAGNOSIS — Q845 Enlarged and hypertrophic nails: Secondary | ICD-10-CM | POA: Diagnosis not present

## 2019-04-17 DIAGNOSIS — R278 Other lack of coordination: Secondary | ICD-10-CM | POA: Diagnosis not present

## 2019-04-17 DIAGNOSIS — B351 Tinea unguium: Secondary | ICD-10-CM | POA: Diagnosis not present

## 2019-04-17 DIAGNOSIS — G9340 Encephalopathy, unspecified: Secondary | ICD-10-CM | POA: Diagnosis not present

## 2019-04-17 DIAGNOSIS — R1311 Dysphagia, oral phase: Secondary | ICD-10-CM | POA: Diagnosis not present

## 2019-04-17 DIAGNOSIS — F0390 Unspecified dementia without behavioral disturbance: Secondary | ICD-10-CM | POA: Diagnosis not present

## 2019-04-17 DIAGNOSIS — R4701 Aphasia: Secondary | ICD-10-CM | POA: Diagnosis not present

## 2019-04-17 DIAGNOSIS — F419 Anxiety disorder, unspecified: Secondary | ICD-10-CM | POA: Diagnosis not present

## 2019-04-19 DIAGNOSIS — R1311 Dysphagia, oral phase: Secondary | ICD-10-CM | POA: Diagnosis not present

## 2019-04-19 DIAGNOSIS — R4701 Aphasia: Secondary | ICD-10-CM | POA: Diagnosis not present

## 2019-04-19 DIAGNOSIS — G9340 Encephalopathy, unspecified: Secondary | ICD-10-CM | POA: Diagnosis not present

## 2019-04-19 DIAGNOSIS — M6281 Muscle weakness (generalized): Secondary | ICD-10-CM | POA: Diagnosis not present

## 2019-04-19 DIAGNOSIS — F0390 Unspecified dementia without behavioral disturbance: Secondary | ICD-10-CM | POA: Diagnosis not present

## 2019-04-19 DIAGNOSIS — F332 Major depressive disorder, recurrent severe without psychotic features: Secondary | ICD-10-CM | POA: Diagnosis not present

## 2019-04-19 DIAGNOSIS — F419 Anxiety disorder, unspecified: Secondary | ICD-10-CM | POA: Diagnosis not present

## 2019-04-19 DIAGNOSIS — Z7901 Long term (current) use of anticoagulants: Secondary | ICD-10-CM | POA: Diagnosis not present

## 2019-04-19 DIAGNOSIS — R278 Other lack of coordination: Secondary | ICD-10-CM | POA: Diagnosis not present

## 2019-04-20 DIAGNOSIS — R4701 Aphasia: Secondary | ICD-10-CM | POA: Diagnosis not present

## 2019-04-20 DIAGNOSIS — F0390 Unspecified dementia without behavioral disturbance: Secondary | ICD-10-CM | POA: Diagnosis not present

## 2019-04-20 DIAGNOSIS — Z7901 Long term (current) use of anticoagulants: Secondary | ICD-10-CM | POA: Diagnosis not present

## 2019-04-20 DIAGNOSIS — R278 Other lack of coordination: Secondary | ICD-10-CM | POA: Diagnosis not present

## 2019-04-20 DIAGNOSIS — F332 Major depressive disorder, recurrent severe without psychotic features: Secondary | ICD-10-CM | POA: Diagnosis not present

## 2019-04-20 DIAGNOSIS — M6281 Muscle weakness (generalized): Secondary | ICD-10-CM | POA: Diagnosis not present

## 2019-04-20 DIAGNOSIS — R1311 Dysphagia, oral phase: Secondary | ICD-10-CM | POA: Diagnosis not present

## 2019-04-20 DIAGNOSIS — F419 Anxiety disorder, unspecified: Secondary | ICD-10-CM | POA: Diagnosis not present

## 2019-04-20 DIAGNOSIS — G9340 Encephalopathy, unspecified: Secondary | ICD-10-CM | POA: Diagnosis not present

## 2019-04-25 DIAGNOSIS — I4891 Unspecified atrial fibrillation: Secondary | ICD-10-CM | POA: Diagnosis not present

## 2019-04-25 DIAGNOSIS — G301 Alzheimer's disease with late onset: Secondary | ICD-10-CM | POA: Diagnosis not present

## 2019-04-25 DIAGNOSIS — F0391 Unspecified dementia with behavioral disturbance: Secondary | ICD-10-CM | POA: Diagnosis not present

## 2019-04-25 DIAGNOSIS — F0281 Dementia in other diseases classified elsewhere with behavioral disturbance: Secondary | ICD-10-CM | POA: Diagnosis not present

## 2019-04-25 DIAGNOSIS — E785 Hyperlipidemia, unspecified: Secondary | ICD-10-CM | POA: Diagnosis not present

## 2019-04-25 DIAGNOSIS — F329 Major depressive disorder, single episode, unspecified: Secondary | ICD-10-CM | POA: Diagnosis not present

## 2019-04-25 DIAGNOSIS — F0632 Mood disorder due to known physiological condition with major depressive-like episode: Secondary | ICD-10-CM | POA: Diagnosis not present

## 2019-04-25 DIAGNOSIS — Z20828 Contact with and (suspected) exposure to other viral communicable diseases: Secondary | ICD-10-CM | POA: Diagnosis not present

## 2019-04-26 DIAGNOSIS — F0281 Dementia in other diseases classified elsewhere with behavioral disturbance: Secondary | ICD-10-CM | POA: Diagnosis not present

## 2019-04-26 DIAGNOSIS — F0632 Mood disorder due to known physiological condition with major depressive-like episode: Secondary | ICD-10-CM | POA: Diagnosis not present

## 2019-04-26 DIAGNOSIS — G301 Alzheimer's disease with late onset: Secondary | ICD-10-CM | POA: Diagnosis not present

## 2019-04-30 DIAGNOSIS — Z20828 Contact with and (suspected) exposure to other viral communicable diseases: Secondary | ICD-10-CM | POA: Diagnosis not present

## 2019-05-07 DIAGNOSIS — Z20828 Contact with and (suspected) exposure to other viral communicable diseases: Secondary | ICD-10-CM | POA: Diagnosis not present

## 2019-05-09 DIAGNOSIS — G301 Alzheimer's disease with late onset: Secondary | ICD-10-CM | POA: Diagnosis not present

## 2019-05-09 DIAGNOSIS — U071 COVID-19: Secondary | ICD-10-CM | POA: Diagnosis not present

## 2019-05-09 DIAGNOSIS — I10 Essential (primary) hypertension: Secondary | ICD-10-CM | POA: Diagnosis not present

## 2019-05-09 DIAGNOSIS — I4891 Unspecified atrial fibrillation: Secondary | ICD-10-CM | POA: Diagnosis not present

## 2019-05-14 ENCOUNTER — Emergency Department (HOSPITAL_COMMUNITY): Payer: Medicare HMO

## 2019-05-14 ENCOUNTER — Emergency Department (HOSPITAL_COMMUNITY)
Admission: EM | Admit: 2019-05-14 | Discharge: 2019-05-15 | Disposition: A | Discharge status: Home / Self Care | Payer: Medicare HMO | Attending: Emergency Medicine | Admitting: Emergency Medicine

## 2019-05-14 DIAGNOSIS — G301 Alzheimer's disease with late onset: Secondary | ICD-10-CM | POA: Diagnosis not present

## 2019-05-14 DIAGNOSIS — J1282 Pneumonia due to coronavirus disease 2019: Secondary | ICD-10-CM | POA: Diagnosis not present

## 2019-05-14 DIAGNOSIS — U071 COVID-19: Secondary | ICD-10-CM | POA: Diagnosis not present

## 2019-05-14 DIAGNOSIS — N179 Acute kidney failure, unspecified: Secondary | ICD-10-CM | POA: Diagnosis not present

## 2019-05-14 DIAGNOSIS — Z7901 Long term (current) use of anticoagulants: Secondary | ICD-10-CM | POA: Diagnosis not present

## 2019-05-14 DIAGNOSIS — I10 Essential (primary) hypertension: Secondary | ICD-10-CM | POA: Insufficient documentation

## 2019-05-14 DIAGNOSIS — Z79899 Other long term (current) drug therapy: Secondary | ICD-10-CM | POA: Diagnosis not present

## 2019-05-14 DIAGNOSIS — R4182 Altered mental status, unspecified: Secondary | ICD-10-CM | POA: Diagnosis not present

## 2019-05-14 DIAGNOSIS — J189 Pneumonia, unspecified organism: Secondary | ICD-10-CM | POA: Diagnosis not present

## 2019-05-14 DIAGNOSIS — G308 Other Alzheimer's disease: Secondary | ICD-10-CM | POA: Diagnosis not present

## 2019-05-14 DIAGNOSIS — F0280 Dementia in other diseases classified elsewhere without behavioral disturbance: Secondary | ICD-10-CM | POA: Diagnosis not present

## 2019-05-14 DIAGNOSIS — R0902 Hypoxemia: Secondary | ICD-10-CM | POA: Diagnosis not present

## 2019-05-14 DIAGNOSIS — I4891 Unspecified atrial fibrillation: Secondary | ICD-10-CM | POA: Diagnosis not present

## 2019-05-14 DIAGNOSIS — J1289 Other viral pneumonia: Secondary | ICD-10-CM | POA: Diagnosis not present

## 2019-05-14 DIAGNOSIS — R0602 Shortness of breath: Secondary | ICD-10-CM | POA: Diagnosis present

## 2019-05-14 DIAGNOSIS — I499 Cardiac arrhythmia, unspecified: Secondary | ICD-10-CM | POA: Diagnosis not present

## 2019-05-14 DIAGNOSIS — R404 Transient alteration of awareness: Secondary | ICD-10-CM | POA: Diagnosis not present

## 2019-05-14 LAB — CBC WITH DIFFERENTIAL/PLATELET
Abs Immature Granulocytes: 0.33 10*3/uL — ABNORMAL HIGH (ref 0.00–0.07)
Basophils Absolute: 0 10*3/uL (ref 0.0–0.1)
Basophils Relative: 0 %
Eosinophils Absolute: 0 10*3/uL (ref 0.0–0.5)
Eosinophils Relative: 0 %
HCT: 55.8 % — ABNORMAL HIGH (ref 36.0–46.0)
Hemoglobin: 16.5 g/dL — ABNORMAL HIGH (ref 12.0–15.0)
Immature Granulocytes: 3 %
Lymphocytes Relative: 21 %
Lymphs Abs: 2.2 10*3/uL (ref 0.7–4.0)
MCH: 30.3 pg (ref 26.0–34.0)
MCHC: 29.6 g/dL — ABNORMAL LOW (ref 30.0–36.0)
MCV: 102.4 fL — ABNORMAL HIGH (ref 80.0–100.0)
Monocytes Absolute: 1.2 10*3/uL — ABNORMAL HIGH (ref 0.1–1.0)
Monocytes Relative: 11 %
Neutro Abs: 6.6 10*3/uL (ref 1.7–7.7)
Neutrophils Relative %: 65 %
Platelets: 212 10*3/uL (ref 150–400)
RBC: 5.45 MIL/uL — ABNORMAL HIGH (ref 3.87–5.11)
RDW: 15 % (ref 11.5–15.5)
WBC: 10.3 10*3/uL (ref 4.0–10.5)
nRBC: 0.7 % — ABNORMAL HIGH (ref 0.0–0.2)

## 2019-05-14 LAB — AMMONIA: Ammonia: 24 umol/L (ref 9–35)

## 2019-05-14 LAB — POC SARS CORONAVIRUS 2 AG -  ED: SARS Coronavirus 2 Ag: POSITIVE — AB

## 2019-05-14 LAB — LACTIC ACID, PLASMA: Lactic Acid, Venous: 6.4 mmol/L (ref 0.5–1.9)

## 2019-05-14 MED ORDER — DILTIAZEM HCL 25 MG/5ML IV SOLN
10.0000 mg | Freq: Once | INTRAVENOUS | Status: AC
  Administered 2019-05-14: 10 mg via INTRAVENOUS
  Filled 2019-05-14: qty 5

## 2019-05-14 MED ORDER — DILTIAZEM HCL-DEXTROSE 125-5 MG/125ML-% IV SOLN (PREMIX)
5.0000 mg/h | INTRAVENOUS | Status: DC
  Administered 2019-05-14: 20:00:00 5 mg/h via INTRAVENOUS
  Filled 2019-05-14: qty 125

## 2019-05-14 MED ORDER — ACETAMINOPHEN 650 MG RE SUPP
650.0000 mg | Freq: Once | RECTAL | Status: AC
  Administered 2019-05-14: 650 mg via RECTAL
  Filled 2019-05-14: qty 1

## 2019-05-14 NOTE — ED Provider Notes (Signed)
[facility] Provider Note   CSN: [id number] Arrival date & time: 05/14/19  1823     History Chief Complaint  Patient presents with  . Shortness of Breath    [facility] is a 79 y.o. female possible history of dementia, depression, GERD, hypertension who presents for evaluation of fever, hypoxia.  Patient coming via EMS from [facility] for evaluation of hypoxia.  Per nursing home, she has Covid positive.  EMS was called out when O2 sats dropped into the 70s and 80s.  On EMS arrival, she was agitated, tachycardia with heart rate into the 180s.  She was hypoxic and placed on oxygen with minimal improvement.   EM LEVEL 5 CAVEAT DUE TO DEMENTIA AND AMS  The history is provided by the patient.       Past Medical History:  Diagnosis Date  . Dementia   . Depression   . GERD (gastroesophageal reflux disease)   . High cholesterol   . Hypertension   . Osteoarthritis    "legs" (11/11/2015)  . Osteoporosis   . Paroxysmal atrial fibrillation ([facility]) 08/20/2014  . Urinary incontinence     Patient Active Problem List   Diagnosis Date Noted  . Sepsis due to gram-negative UTI ([place]) 11/20/2017  . Leg pain 10/19/2017  . Acute cystitis without hematuria   . Altered mental status   . Chronic atrial fibrillation ([facility])   . 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 Dehydration   . Late onset Alzheimer's disease without behavioral disturbance ([facility])   . Acute renal injury ([facility]) 07/01/2017  . E. coli UTI (urinary tract infection) 06/30/2017  . Cough 12/28/2016  . Black stool 12/28/2016  . Pressure injury of skin 11/20/2016  . Inferior pubic ramus fracture ([facility]) 11/19/2016  . Hallucinations 09/12/2016  . Unsteady gait 05/29/2016  . Atrial flutter ([facility]) 11/20/2015  . 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 Paroxysmal atrial fibrillation ([facility]) 09/25/2015  . Diverticulosis 06/20/2014  . Preventative health  care 10/15/2010  . Hyperlipidemia 01/29/2009  . GERD 04/15/2008  . Depression with anxiety 09/01/2007  . Essential hypertension 07/13/2006  . Osteoporosis 07/13/2006    Past Surgical History:  Procedure Laterality Date  . APPENDECTOMY    . CHOLECYSTECTOMY N/A 10/01/2014   Procedure: LAPAROSCOPIC CHOLECYSTECTOMY ;  Surgeon: Erroll Luna, MD;  Location: [facility];  Service: General;  Laterality: N/A;  . DILATION AND CURETTAGE OF UTERUS     "after I had my boys"  . ERCP N/A 09/27/2014   Procedure: ENDOSCOPIC RETROGRADE CHOLANGIOPANCREATOGRAPHY (ERCP);  Surgeon: Carol Ada, MD;  Location: [facility] ENDOSCOPY;  Service: Endoscopy;  Laterality: N/A;  . VAGINAL HYSTERECTOMY     with unilateral oophorectomy     OB History   No obstetric history on file.     Family History  Problem Relation Age of Onset  . Heart disease Mother        onset 36s  . Heart disease Brother 30    Social History   Tobacco Use  . Smoking status: Never Smoker  . Smokeless tobacco: Never Used  Substance Use Topics  . Alcohol use: No    Alcohol/week: 0.0 standard drinks  . Drug use: No    Home Medications Prior to Admission medications   Medication Sig Start Date End Date Taking? Authorizing Provider  albuterol (PROVENTIL) (2.5 MG/3ML) 0.083% nebulizer solution Take 2.5 mg by nebulization every 4 (four) hours as needed for wheezing.   Yes [provider]  alendronate (FOSAMAX) 70 MG tablet Take 1 tablet (70 mg total) by mouth every 7 (seven) days. Take with a full glass of water on an empty stomach. Patient taking differently: Take 70 mg by mouth every Monday. Take with a full glass of water on an empty stomach. 09/24/16  Yes Ophelia Shoulder, MD  ALPRAZolam Duanne Moron) 0.25 MG tablet Take 0.25 mg by mouth 2 (two) times daily as needed for anxiety.   Yes [provider]  apixaban (ELIQUIS) 5 MG TABS tablet Take 1 tablet (5 mg total) by mouth 2 (two) times daily. 10/18/17  Yes Ledell Noss, MD  ascorbic  acid (VITAMIN C) 500 MG tablet Take 500 mg by mouth 2 (two) times daily.   Yes [provider]  benzonatate (TESSALON) 100 MG capsule Take 100 mg by mouth every 8 (eight) hours as needed for cough.   Yes [provider]  buPROPion (WELLBUTRIN XL) 150 MG 24 hr tablet Take 150 mg by mouth daily.   Yes [provider]  Calcium Carb-Cholecalciferol (CALCIUM 600-D PO) Take 1 tablet by mouth 2 (two) times daily.   Yes [provider]  dexamethasone (DECADRON) 6 MG tablet Take 6 mg by mouth daily.   Yes [provider]  divalproex (DEPAKOTE SPRINKLE) 125 MG capsule Take 250 mg by mouth 3 (three) times daily.   Yes [provider]  escitalopram (LEXAPRO) 5 MG tablet Take 5 mg by mouth daily.   Yes [provider]  loperamide (IMODIUM A-D) 2 MG tablet Take 2-4 mg by mouth See admin instructions. Take 2 tablets (4 mg) by mouth after 1st loose stool and then take 1 tablet (2 mg) after each subsequent loose stool - not to exceed 8 tablets in 24 hours   Yes [provider]  metoprolol tartrate (LOPRESSOR) 25 MG tablet Take 0.5 tablets (12.5 mg total) by mouth 2 (two) times daily. 04/08/17 07/12/19 Yes NedrudLarena Glassman, MD  Multiple Vitamins-Minerals (MULTIVITAMIN WITH MINERALS) tablet Take 1 tablet by mouth daily.   Yes [provider]  nitroGLYCERIN (NITROSTAT) 0.4 MG SL tablet DISSOLVE 1 TABLET UNDER THE TONGUE EVERY 5 MINUTES AS NEEDED FOR CHEST PAIN Patient taking differently: Place 0.4 mg under the tongue every 5 (five) minutes as needed for chest pain (not to exceed 3 doses).  12/14/16  Yes Lucious Groves, DO  OLANZapine (ZYPREXA) 5 MG tablet Take 5 mg by mouth at bedtime.   Yes [provider]  simvastatin (ZOCOR) 10 MG tablet Take 1 tablet (10 mg total) by mouth daily. Patient taking differently: Take 10 mg by mouth at bedtime.  10/18/17  Yes Ledell Noss, MD  Vitamin D, Ergocalciferol, 50 MCG (2000 UT) CAPS Take 2,000  Units by mouth daily.   Yes [provider]  Zinc Gluconate 100 MG TABS Take 100 mg by mouth daily.   Yes [provider]  cholestyramine (QUESTRAN) 4 g packet [place] PACKET BY MOUTH AS NEEDED Patient not taking: Reported on 05/14/2019 08/23/17   Ledell Noss, MD  nystatin cream (MYCOSTATIN) Apply topically 2 (two) times daily. Patient not taking: Reported on 05/14/2019 11/25/17   Dewayne Hatch, MD  sertraline (ZOLOFT) 50 MG tablet TAKE 1 TABLET BY MOUTH AT BEDTIME Patient not taking: Reported on 05/14/2019 05/12/17   Ledell Noss, MD    Allergies    Morphine and related  Review of Systems   Review of Systems  Unable to perform ROS: Dementia    Physical Exam Updated Vital  Signs BP 113/67 (BP Location: Right Arm)   Pulse 83   Temp (!) 101.5 F (38.6 C) (Rectal)   Resp 19   SpO2 99%   Physical Exam Vitals and nursing note reviewed.  Constitutional:      Appearance: She is ill-appearing.     Comments: Frail and elderly appearing.  HENT:     Head: Normocephalic and atraumatic.     Mouth/Throat:     Comments: Dry mucous membranes. Eyes:     General: Lids are normal.     Conjunctiva/sclera: Conjunctivae normal.     Pupils: Pupils are equal, round, and reactive to light.  Cardiovascular:     Rate and Rhythm: Tachycardia present. Rhythm irregular.     Heart sounds: Normal heart sounds. No murmur. No friction rub. No gallop.   Pulmonary:     Effort: Tachypnea present.     Breath sounds: Rhonchi present.     Comments: Diffuse rhonchi noted throughout.  Tachypnea noted. Abdominal:     Palpations: Abdomen is soft. Abdomen is not rigid.     Tenderness: There is no abdominal tenderness. There is no guarding.  Musculoskeletal:        General: Normal range of motion.     Cervical back: Full passive range of motion without pain.  Skin:    General: Skin is warm and dry.     Capillary Refill: Capillary refill takes less than 2 seconds.  Neurological:      Mental Status: She is alert.     Comments: Demented.  Psychiatric:        Speech: Speech normal.     ED Results / Procedures / Treatments   Labs (all labs ordered are listed, but only abnormal results are displayed) Labs Reviewed  COMPREHENSIVE METABOLIC PANEL - Abnormal; Notable for the following components:      Result Value   Sodium 165 (*)    Chloride 126 (*)    Glucose, Bld 111 (*)    BUN 61 (*)    Creatinine, Ser 2.47 (*)    Albumin 3.0 (*)    AST 92 (*)    GFR calc non Af Amer 18 (*)    GFR calc Af Amer 21 (*)    All other components within normal limits  LACTIC ACID, PLASMA - Abnormal; Notable for the following components:   Lactic Acid, Venous 6.4 (*)    All other components within normal limits  CBC WITH DIFFERENTIAL/PLATELET - Abnormal; Notable for the following components:   RBC 5.45 (*)    Hemoglobin 16.5 (*)    HCT 55.8 (*)    MCV 102.4 (*)    MCHC 29.6 (*)    nRBC 0.7 (*)    Monocytes Absolute 1.2 (*)    Abs Immature Granulocytes 0.33 (*)    All other components within normal limits  POC SARS CORONAVIRUS 2 AG -  ED - Abnormal; Notable for the following components:   SARS Coronavirus 2 Ag POSITIVE (*)    All other components within normal limits  CULTURE, BLOOD (ROUTINE X 2)  CULTURE, BLOOD (ROUTINE X 2)  AMMONIA  LACTIC ACID, PLASMA    EKG EKG Interpretation  Date/Time:  Monday May 14 2019 18:46:43 EST Ventricular Rate:  166 PR Interval:    QRS Duration: 52 QT Interval:  258 QTC Calculation: 429 R Axis:   9 Text Interpretation: Atrial fibrillation with rapid V-rate Ventricular premature complex Repolarization abnormality, prob rate related since last tracing no significant change Confirmed  by Daleen Bo [phone]) on 05/14/2019 7:12:21 PM   Radiology DG Chest Portable 1 View  Result Date: 05/14/2019 CLINICAL DATA:  79 year old female with altered mental status. Positive COVID-19 EXAM: PORTABLE CHEST 1 VIEW COMPARISON:  Chest radiograph  dated 11/20/2017 FINDINGS: Bilateral patchy streaky and hazy densities most prominent involving the left lower lung field consistent with pneumonia. Clinical correlation and follow-up recommended. There is no pleural effusion or pneumothorax. Mild cardiomegaly. No acute osseous pathology. Degenerative changes of the spine. IMPRESSION: Multifocal pneumonia. Clinical correlation and follow-up to resolution recommended. Electronically Signed   By: Anner Crete M.D.   On: 05/14/2019 20:17    Procedures Procedures (including critical care time)  Medications Ordered in ED Medications  diltiazem (CARDIZEM) 125 mg in dextrose 5% 125 mL (1 mg/mL) infusion (0 mg/hr Intravenous Stopped 05/14/19 2130)  diltiazem (CARDIZEM) injection 10 mg (10 mg Intravenous Given 05/14/19 1908)  acetaminophen (TYLENOL) suppository 650 mg (650 mg Rectal Given 05/14/19 1909)    ED Course  I have reviewed the triage vital signs and the nursing notes.  Pertinent labs & imaging results that were available during my care of the patient were reviewed by me and considered in my medical decision making (see chart for details).    MDM Rules/Calculators/A&P                      79 year old female brought in by EMS from [facility] home with history of dementia who presents for evaluation of hypoxia.  Patient recently diagnosed with COVID-19.  Hypoxia nursing home prompting EMS call.  Initial arrival, she is febrile, tachycardic.  She has rhonchorous breath sounds and is hypoxic.  She continuously dips down into the 70s and 80s on 6 L.  She was placed on 15 L nonrebreather with improvement.  Poor historian with dementia at baseline.  Patient initially with a DNR form.  MOST form was not initially seen during my initial evaluation.    Patient brought into the ED with DNR and MOST form.  Her MOST form which was updated in 2019 showed comfort measures.  They did not wish to transfer to hospital unless comfort needs cannot be  met in current location.  I was unable to contact family.  I tried calling patient's niece, granddaughter and son.  I left messages for family but was unable to get in contact with anybody.  Patient is Covid positive.  CBC shows no leukocytosis.  Hemoglobin is 16.5.  Lactic is 6.4.  Chest x-ray shows multifocal pneumonia.  Ammonia is 24. CMP shows sodium of 165, BUN of 61 and Cr of 2.47. Her last Cr was in 2019 and was 0.65 at that time.   Attempted to call family again but was unable to get in contact with anybody.  At this time, patient is still tachycardic though her A. fib has improved.  She is still requiring oxygen but is otherwise stable.  Per her MOST form, she does not wish to have any IV fluids, antibiotics, feeding tube.  She is comfort measures only.  I discussed with Dr. Delfina Redwood (primary physician) regarding findings here in the ED. He agrees with plan to follow intended wishes on MOST form and that patient can be appropriately transferred back to the nursing home.   I discussed this with Dr. Eulis Foster who was agreeable to plan. I attempted to get in contact with patient's family, including her niece, her granddaughter and her son but was unable to get in contact  with any family members. Her MOST form indicates no IV fluids, abx and comfort measures only. Patient will be transferred back to [facility].   Portions of this note were generated with Lobby[employer]. Dictation errors may occur despite best attempts at proofreading.   Final Clinical Impression(s) / ED Diagnoses Final diagnoses:  COVID-19  AKI (acute kidney injury) ([place])  Hypoxia  Pneumonia due to COVID-19 virus    Rx / DC Orders ED Discharge Orders    None       Desma Mcgregor 05/14/19 2302    Daleen Bo, MD 05/09/2019 1113

## 2019-05-14 NOTE — Care Management (Signed)
ED CM attempted to contact [facility] multiple times as well,  CM called to have a wellness check done, updated charge nurse.

## 2019-05-14 NOTE — ED Notes (Signed)
[facility]ility [facility] is contacted multiple times and it was unsuccessful [phone]; Attempting to inform facility of discharge plans.

## 2019-05-14 NOTE — ED Notes (Signed)
PA was notified of the status of COVID test

## 2019-05-14 NOTE — ED Triage Notes (Signed)
Pt. Arrived from [facility] via [facility]. Pt was in covid ward but EMS can not advise when or where she tested positive. Pt. Is non-verbal and bed bound. EMS advised Lungs diminished and stating 85%. EMS put her on Non rebreather. EMS Vitals 118/69, Resp 30 97% on non rebreather. EMS also reported pt have Afib with RVR with systolic between 856 to 200. Has history of encephop.

## 2019-05-14 NOTE — ED Provider Notes (Signed)
  Face-to-face evaluation   History: Obese female, unable to give any history.  Physical exam: Ill-appearing patient.  She is agitated.  She is not in respiratory distress.  Abdomen is protuberant, but soft and nontender.  Irregular fast pulse, left radial artery.  Medical screening examination/treatment/procedure(s) were conducted as a shared visit with non-physician practitioner(s) and myself.  I personally evaluated the patient during the encounter    Mancel Bale, MD 05/17/2019 1114

## 2019-05-14 NOTE — ED Notes (Signed)
Cidi with [facility] updated of discharge plans, informed of the details on the medical orders for scope of Treatment. At this time, pt in ED resting, awaiting PTAR.

## 2019-05-15 ENCOUNTER — Telehealth: Payer: Self-pay | Admitting: Nurse Practitioner

## 2019-05-15 DIAGNOSIS — Z7401 Bed confinement status: Secondary | ICD-10-CM | POA: Diagnosis not present

## 2019-05-15 DIAGNOSIS — U071 COVID-19: Secondary | ICD-10-CM | POA: Diagnosis not present

## 2019-05-15 DIAGNOSIS — R000 Tachycardia, unspecified: Secondary | ICD-10-CM | POA: Diagnosis not present

## 2019-05-15 DIAGNOSIS — I4891 Unspecified atrial fibrillation: Secondary | ICD-10-CM | POA: Diagnosis not present

## 2019-05-15 DIAGNOSIS — E870 Hyperosmolality and hypernatremia: Secondary | ICD-10-CM | POA: Diagnosis not present

## 2019-05-15 DIAGNOSIS — N178 Other acute kidney failure: Secondary | ICD-10-CM | POA: Diagnosis not present

## 2019-05-15 DIAGNOSIS — R0602 Shortness of breath: Secondary | ICD-10-CM | POA: Diagnosis not present

## 2019-05-15 DIAGNOSIS — M2550 Pain in unspecified joint: Secondary | ICD-10-CM | POA: Diagnosis not present

## 2019-05-15 LAB — COMPREHENSIVE METABOLIC PANEL
ALT: 43 U/L (ref 0–44)
AST: 92 U/L — ABNORMAL HIGH (ref 15–41)
Albumin: 3 g/dL — ABNORMAL LOW (ref 3.5–5.0)
Alkaline Phosphatase: 57 U/L (ref 38–126)
Anion gap: 14 (ref 5–15)
BUN: 61 mg/dL — ABNORMAL HIGH (ref 8–23)
CO2: 25 mmol/L (ref 22–32)
Calcium: 10.2 mg/dL (ref 8.9–10.3)
Chloride: 126 mmol/L — ABNORMAL HIGH (ref 98–111)
Creatinine, Ser: 2.47 mg/dL — ABNORMAL HIGH (ref 0.44–1.00)
GFR calc Af Amer: 21 mL/min — ABNORMAL LOW (ref >60)
GFR calc non Af Amer: 18 mL/min — ABNORMAL LOW (ref >60)
Glucose, Bld: 111 mg/dL — ABNORMAL HIGH (ref 70–99)
Potassium: 4.4 mmol/L (ref 3.5–5.1)
Sodium: 165 mmol/L (ref 135–145)
Total Bilirubin: 1 mg/dL (ref 0.3–1.2)
Total Protein: 7.5 g/dL (ref 6.5–8.1)

## 2019-05-15 NOTE — ED Notes (Signed)
Per Magnus Ivan, RN report was called to facility. Report was given to PTAR and PTAR is at bedside.

## 2019-05-15 NOTE — ED Notes (Signed)
Patient verbalizes understanding of discharge instructions. Opportunity for questioning and answers were provided. Armband removed by staff, pt discharged from ED through PTAR to Facility.

## 2019-05-15 NOTE — Telephone Encounter (Signed)
Called to Discuss with patient about Covid symptoms and the use of bamlanivimab, a monoclonal antibody infusion for those with mild to moderate Covid symptoms and at a high risk of hospitalization.     Pt is qualified for this infusion at the [facility] due to co-morbid conditions and/or a member of an at-risk group.     Unable to reach pt  

## 2019-05-16 DIAGNOSIS — E870 Hyperosmolality and hypernatremia: Secondary | ICD-10-CM | POA: Diagnosis not present

## 2019-05-16 DIAGNOSIS — R451 Restlessness and agitation: Secondary | ICD-10-CM | POA: Diagnosis not present

## 2019-05-16 DIAGNOSIS — N178 Other acute kidney failure: Secondary | ICD-10-CM | POA: Diagnosis not present

## 2019-05-16 DIAGNOSIS — U071 COVID-19: Secondary | ICD-10-CM | POA: Diagnosis not present

## 2019-05-19 LAB — CULTURE, BLOOD (ROUTINE X 2)
Culture: NO GROWTH
Culture: NO GROWTH

## 2019-06-04 DEATH — deceased

## 2019-08-21 ENCOUNTER — Encounter: Payer: Self-pay | Admitting: *Deleted

## 2021-02-02 ENCOUNTER — Encounter: Payer: Self-pay | Admitting: *Deleted

## 2021-02-02 NOTE — Progress Notes (Unsigned)

## 2021-02-10 ENCOUNTER — Encounter: Payer: Self-pay | Admitting: *Deleted

## 2021-02-10 NOTE — Progress Notes (Unsigned)

## 2021-02-18 ENCOUNTER — Encounter: Payer: Self-pay | Admitting: Student

## 2021-02-18 NOTE — Progress Notes (Unsigned)
Things That May Be Affecting Your Health:  Alcohol  Hearing loss  Pain   X Depression  Home Safety  Sexual Health   Diabetes  Lack of physical activity  Stress  X Difficulty with daily activities X Loneliness  Tiredness   Drug use X Medicines  Tobacco use   Falls  Motor Vehicle Safety  Weight   Food choices  Oral Health  Other    YOUR PERSONALIZED HEALTH PLAN : 1. Schedule your next subsequent Medicare Wellness visit in one year 2. Attend all of your regular appointments to address your medical issues 3. Complete the preventative screenings and services   Annual Wellness Visit   Medicare Covered Preventative Screenings and Services  Services & Screenings Men and Women Who How Often Need? Date of Last Service Action  Abdominal Aortic Aneurysm Adults with AAA risk factors Once      Alcohol Misuse and Counseling All Adults Screening once a year if no alcohol misuse. Counseling up to 4 face to face sessions.     Bone Density Measurement  Adults at risk for osteoporosis Once every 2 yrs      Lipid Panel Z13.6 All adults without CV disease Once every 5 yrs       Colorectal Cancer  Stool sample or Colonoscopy All adults 50 and older  Once every year Every 10 years        Depression All Adults Once a year X Today   Diabetes Screening Blood glucose, post glucose load, or GTT Z13.1 All adults at risk Pre-diabetics Once per year Twice per year X 5.7% in 2008    Diabetes  Self-Management Training All adults Diabetics 10 hrs first year; 2 hours subsequent years. Requires Copay     Glaucoma Diabetics Family history of glaucoma African Americans 50 yrs + Hispanic Americans 65 yrs + Annually - requires coppay      Hepatitis C Z72.89 or F19.20 High Risk for HCV Born between 1945 and 1965 Annually Once      HIV Z11.4 All adults based on risk Annually btw ages 27 & 7 regardless of risk Annually > 65 yrs if at increased risk      Lung Cancer Screening Asymptomatic adults aged  43-77 with 30 pack yr history and current smoker OR quit within the last 15 yrs Annually Must have counseling and shared decision making documentation before first screen      Medical Nutrition Therapy Adults with  Diabetes Renal disease Kidney transplant within past 3 yrs 3 hours first year; 2 hours subsequent years     Obesity and Counseling All adults Screening once a year Counseling if BMI 30 or higher  Today   Tobacco Use Counseling Adults who use tobacco  Up to 8 visits in one year     Vaccines Z23 Hepatitis B Influenza  Pneumonia  Adults  Once Once every flu season Two different vaccines separated by one year X Unknown Check to see if she got her flu vaccine  Next Annual Wellness Visit People with Medicare Every year  Today     Services & Screenings Women Who How Often Need  Date of Last Service Action  Mammogram  Z12.31 Women over 40 One baseline ages 9-39. Annually ager 40 yrs+      Pap tests All women Annually if high risk. Every 2 yrs for normal risk women      Screening for cervical cancer with  Pap (Z01.419 nl or Z01.411abnl) & HPV Z11.51 Women aged 77  to 65 Once every 5 yrs     Screening pelvic and breast exams All women Annually if high risk. Every 2 yrs for normal risk women     Sexually Transmitted Diseases Chlamydia Gonorrhea Syphilis All at risk adults Annually for non pregnant females at increased risk         Services & Screenings Men Who How Ofter Need  Date of Last Service Action  Prostate Cancer - DRE & PSA Men over 50 Annually.  DRE might require a copay.        Sexually Transmitted Diseases Syphilis All at risk adults Annually for men at increased risk      Health Maintenance List Health Maintenance  Topic Date Due   COVID-19 Vaccine (1) Never done   URINE MICROALBUMIN  Never done   Zoster Vaccines- Shingrix (1 of 2) Never done   INFLUENZA VACCINE  12/01/2020   TETANUS/TDAP  09/19/2026   DEXA SCAN  Completed   HPV VACCINES  Aged  Out
